# Patient Record
Sex: Male | Born: 1935 | Race: White | Hispanic: No | Marital: Married | Smoking: Former smoker
Health system: Southern US, Community
[De-identification: ages and names within clinical notes are randomized; demographics above are authoritative.]

## PROBLEM LIST (undated history)

## (undated) ENCOUNTER — Emergency Department (HOSPITAL_COMMUNITY): Disposition: A | Discharge status: Other Acute Inpatient Hospital | Payer: Medicare Other

## (undated) ENCOUNTER — Emergency Department (HOSPITAL_COMMUNITY): Discharge status: Absent without leave | Payer: Self-pay

## (undated) DIAGNOSIS — I7100 Dissection of unspecified site of aorta: Secondary | ICD-10-CM

## (undated) DIAGNOSIS — I2510 Atherosclerotic heart disease of native coronary artery without angina pectoris: Secondary | ICD-10-CM

## (undated) DIAGNOSIS — G5792 Unspecified mononeuropathy of left lower limb: Secondary | ICD-10-CM

## (undated) DIAGNOSIS — I739 Peripheral vascular disease, unspecified: Secondary | ICD-10-CM

## (undated) DIAGNOSIS — R011 Cardiac murmur, unspecified: Secondary | ICD-10-CM

## (undated) DIAGNOSIS — E119 Type 2 diabetes mellitus without complications: Secondary | ICD-10-CM

## (undated) DIAGNOSIS — Q2730 Arteriovenous malformation, site unspecified: Secondary | ICD-10-CM

## (undated) DIAGNOSIS — Z951 Presence of aortocoronary bypass graft: Secondary | ICD-10-CM

## (undated) DIAGNOSIS — I219 Acute myocardial infarction, unspecified: Secondary | ICD-10-CM

## (undated) DIAGNOSIS — E785 Hyperlipidemia, unspecified: Secondary | ICD-10-CM

## (undated) DIAGNOSIS — I4891 Unspecified atrial fibrillation: Secondary | ICD-10-CM

## (undated) DIAGNOSIS — I10 Essential (primary) hypertension: Secondary | ICD-10-CM

## (undated) HISTORY — DX: Cardiac murmur, unspecified: R01.1

## (undated) HISTORY — DX: Presence of aortocoronary bypass graft: Z95.1

## (undated) HISTORY — PX: BACK SURGERY: SHX140

## (undated) HISTORY — DX: Hyperlipidemia, unspecified: E78.5

## (undated) HISTORY — DX: Dissection of unspecified site of aorta: I71.00

## (undated) HISTORY — DX: Unspecified atrial fibrillation: I48.91

## (undated) HISTORY — DX: Arteriovenous malformation, site unspecified: Q27.30

## (undated) HISTORY — DX: Unspecified mononeuropathy of left lower limb: G57.92

---

## 2000-03-22 ENCOUNTER — Ambulatory Visit (HOSPITAL_COMMUNITY): Admission: RE | Admit: 2000-03-22 | Discharge: 2000-03-22 | Payer: Self-pay | Admitting: Family Medicine

## 2000-03-22 ENCOUNTER — Encounter: Payer: Self-pay | Admitting: Family Medicine

## 2000-07-25 ENCOUNTER — Encounter: Payer: Self-pay | Admitting: Neurosurgery

## 2000-07-25 ENCOUNTER — Ambulatory Visit (HOSPITAL_COMMUNITY): Admission: RE | Admit: 2000-07-25 | Discharge: 2000-07-25 | Payer: Self-pay | Admitting: Neurosurgery

## 2000-08-05 ENCOUNTER — Encounter: Payer: Self-pay | Admitting: Neurosurgery

## 2000-08-07 ENCOUNTER — Encounter: Payer: Self-pay | Admitting: Neurosurgery

## 2000-08-07 ENCOUNTER — Inpatient Hospital Stay (HOSPITAL_COMMUNITY): Admission: RE | Admit: 2000-08-07 | Discharge: 2000-08-10 | Payer: Self-pay | Admitting: Neurosurgery

## 2000-09-22 ENCOUNTER — Encounter: Payer: Self-pay | Admitting: Neurosurgery

## 2000-09-22 ENCOUNTER — Encounter: Admission: RE | Admit: 2000-09-22 | Discharge: 2000-09-22 | Payer: Self-pay | Admitting: Neurosurgery

## 2000-10-21 ENCOUNTER — Encounter: Payer: Self-pay | Admitting: Neurosurgery

## 2000-10-21 ENCOUNTER — Encounter: Admission: RE | Admit: 2000-10-21 | Discharge: 2000-10-21 | Payer: Self-pay | Admitting: Neurosurgery

## 2000-10-23 ENCOUNTER — Encounter: Payer: Self-pay | Admitting: Neurosurgery

## 2000-10-23 ENCOUNTER — Ambulatory Visit (HOSPITAL_COMMUNITY): Admission: RE | Admit: 2000-10-23 | Discharge: 2000-10-23 | Payer: Self-pay | Admitting: Neurosurgery

## 2000-11-24 ENCOUNTER — Ambulatory Visit (HOSPITAL_COMMUNITY): Admission: RE | Admit: 2000-11-24 | Discharge: 2000-11-24 | Payer: Self-pay | Admitting: Neurology

## 2000-11-24 ENCOUNTER — Encounter: Payer: Self-pay | Admitting: Neurology

## 2000-11-24 ENCOUNTER — Encounter (INDEPENDENT_AMBULATORY_CARE_PROVIDER_SITE_OTHER): Payer: Self-pay | Admitting: *Deleted

## 2000-12-03 ENCOUNTER — Encounter: Payer: Self-pay | Admitting: Neurology

## 2000-12-03 ENCOUNTER — Encounter (INDEPENDENT_AMBULATORY_CARE_PROVIDER_SITE_OTHER): Payer: Self-pay | Admitting: Specialist

## 2000-12-03 ENCOUNTER — Observation Stay (HOSPITAL_COMMUNITY): Admission: RE | Admit: 2000-12-03 | Discharge: 2000-12-03 | Payer: Self-pay | Admitting: Neurosurgery

## 2000-12-16 ENCOUNTER — Encounter (INDEPENDENT_AMBULATORY_CARE_PROVIDER_SITE_OTHER): Payer: Self-pay | Admitting: *Deleted

## 2000-12-16 ENCOUNTER — Encounter: Payer: Self-pay | Admitting: Thoracic Surgery

## 2000-12-16 ENCOUNTER — Ambulatory Visit (HOSPITAL_COMMUNITY): Admission: RE | Admit: 2000-12-16 | Discharge: 2000-12-16 | Payer: Self-pay | Admitting: Thoracic Surgery

## 2000-12-22 ENCOUNTER — Encounter (HOSPITAL_COMMUNITY): Admission: RE | Admit: 2000-12-22 | Discharge: 2001-03-22 | Payer: Self-pay | Admitting: Neurology

## 2001-01-06 ENCOUNTER — Encounter: Admission: RE | Admit: 2001-01-06 | Discharge: 2001-01-06 | Payer: Self-pay | Admitting: Neurosurgery

## 2001-01-06 ENCOUNTER — Encounter: Payer: Self-pay | Admitting: Neurosurgery

## 2001-01-09 ENCOUNTER — Encounter: Payer: Self-pay | Admitting: Neurology

## 2001-01-09 ENCOUNTER — Ambulatory Visit (HOSPITAL_COMMUNITY): Admission: RE | Admit: 2001-01-09 | Discharge: 2001-01-09 | Payer: Self-pay | Admitting: Neurology

## 2001-01-12 ENCOUNTER — Ambulatory Visit (HOSPITAL_COMMUNITY): Admission: RE | Admit: 2001-01-12 | Discharge: 2001-01-12 | Payer: Self-pay | Admitting: Neurology

## 2001-01-12 ENCOUNTER — Encounter: Payer: Self-pay | Admitting: Neurology

## 2001-01-13 ENCOUNTER — Encounter: Payer: Self-pay | Admitting: Neurology

## 2001-01-13 ENCOUNTER — Inpatient Hospital Stay (HOSPITAL_COMMUNITY): Admission: AD | Admit: 2001-01-13 | Discharge: 2001-01-15 | Payer: Self-pay | Admitting: Neurology

## 2001-01-17 ENCOUNTER — Encounter: Payer: Self-pay | Admitting: Emergency Medicine

## 2001-01-17 ENCOUNTER — Emergency Department (HOSPITAL_COMMUNITY): Admission: EM | Admit: 2001-01-17 | Discharge: 2001-01-17 | Payer: Self-pay | Admitting: Emergency Medicine

## 2001-02-20 ENCOUNTER — Inpatient Hospital Stay (HOSPITAL_COMMUNITY)
Admission: RE | Admit: 2001-02-20 | Discharge: 2001-03-03 | Payer: Self-pay | Admitting: Physical Medicine & Rehabilitation

## 2001-03-09 ENCOUNTER — Encounter
Admission: RE | Admit: 2001-03-09 | Discharge: 2001-06-07 | Payer: Self-pay | Admitting: Physical Medicine & Rehabilitation

## 2001-05-12 ENCOUNTER — Encounter: Payer: Self-pay | Admitting: Neurosurgery

## 2001-05-12 ENCOUNTER — Encounter: Admission: RE | Admit: 2001-05-12 | Discharge: 2001-05-12 | Payer: Self-pay | Admitting: Neurosurgery

## 2001-06-08 ENCOUNTER — Encounter
Admission: RE | Admit: 2001-06-08 | Discharge: 2001-07-31 | Payer: Self-pay | Admitting: Physical Medicine & Rehabilitation

## 2002-07-19 ENCOUNTER — Ambulatory Visit (HOSPITAL_COMMUNITY): Admission: RE | Admit: 2002-07-19 | Discharge: 2002-07-19 | Payer: Self-pay | Admitting: Gastroenterology

## 2002-07-19 ENCOUNTER — Encounter (INDEPENDENT_AMBULATORY_CARE_PROVIDER_SITE_OTHER): Payer: Self-pay | Admitting: Specialist

## 2002-09-08 ENCOUNTER — Encounter: Payer: Self-pay | Admitting: Emergency Medicine

## 2002-09-08 ENCOUNTER — Inpatient Hospital Stay (HOSPITAL_COMMUNITY): Admission: EM | Admit: 2002-09-08 | Discharge: 2002-09-28 | Payer: Self-pay | Admitting: Emergency Medicine

## 2002-09-08 ENCOUNTER — Encounter (INDEPENDENT_AMBULATORY_CARE_PROVIDER_SITE_OTHER): Payer: Self-pay | Admitting: Specialist

## 2002-09-09 ENCOUNTER — Encounter: Payer: Self-pay | Admitting: Thoracic Surgery (Cardiothoracic Vascular Surgery)

## 2002-09-10 ENCOUNTER — Encounter: Payer: Self-pay | Admitting: Thoracic Surgery (Cardiothoracic Vascular Surgery)

## 2002-09-11 ENCOUNTER — Encounter: Payer: Self-pay | Admitting: Thoracic Surgery (Cardiothoracic Vascular Surgery)

## 2002-09-12 ENCOUNTER — Encounter: Payer: Self-pay | Admitting: Thoracic Surgery (Cardiothoracic Vascular Surgery)

## 2002-09-13 ENCOUNTER — Encounter (INDEPENDENT_AMBULATORY_CARE_PROVIDER_SITE_OTHER): Payer: Self-pay | Admitting: Cardiovascular Disease

## 2002-09-15 ENCOUNTER — Encounter: Payer: Self-pay | Admitting: Thoracic Surgery (Cardiothoracic Vascular Surgery)

## 2002-09-17 ENCOUNTER — Encounter: Payer: Self-pay | Admitting: Infectious Diseases

## 2002-09-20 ENCOUNTER — Encounter: Payer: Self-pay | Admitting: Thoracic Surgery (Cardiothoracic Vascular Surgery)

## 2002-09-28 ENCOUNTER — Inpatient Hospital Stay
Admission: RE | Admit: 2002-09-28 | Discharge: 2002-10-01 | Payer: Self-pay | Admitting: Physical Medicine & Rehabilitation

## 2002-10-01 ENCOUNTER — Encounter (HOSPITAL_COMMUNITY)
Admission: RE | Admit: 2002-10-01 | Discharge: 2002-10-16 | Payer: Self-pay | Admitting: Physical Medicine & Rehabilitation

## 2002-11-02 ENCOUNTER — Encounter
Admission: RE | Admit: 2002-11-02 | Discharge: 2002-11-02 | Payer: Self-pay | Admitting: Thoracic Surgery (Cardiothoracic Vascular Surgery)

## 2002-11-02 ENCOUNTER — Encounter: Payer: Self-pay | Admitting: Thoracic Surgery (Cardiothoracic Vascular Surgery)

## 2002-11-04 ENCOUNTER — Encounter
Admission: RE | Admit: 2002-11-04 | Discharge: 2003-02-02 | Payer: Self-pay | Admitting: Physical Medicine & Rehabilitation

## 2005-02-02 ENCOUNTER — Ambulatory Visit: Payer: Self-pay | Admitting: Physical Medicine & Rehabilitation

## 2005-02-02 ENCOUNTER — Inpatient Hospital Stay (HOSPITAL_COMMUNITY): Admission: EM | Admit: 2005-02-02 | Discharge: 2005-02-20 | Payer: Self-pay | Admitting: Emergency Medicine

## 2005-02-04 HISTORY — PX: CARDIAC CATHETERIZATION: SHX172

## 2005-02-05 ENCOUNTER — Encounter (INDEPENDENT_AMBULATORY_CARE_PROVIDER_SITE_OTHER): Payer: Self-pay | Admitting: Cardiovascular Disease

## 2005-02-11 HISTORY — PX: CORONARY ARTERY BYPASS GRAFT: SHX141

## 2005-02-13 ENCOUNTER — Encounter: Payer: Self-pay | Admitting: Thoracic Surgery (Cardiothoracic Vascular Surgery)

## 2005-02-20 ENCOUNTER — Inpatient Hospital Stay
Admission: RE | Admit: 2005-02-20 | Discharge: 2005-02-25 | Payer: Self-pay | Admitting: Physical Medicine & Rehabilitation

## 2005-02-28 ENCOUNTER — Encounter
Admission: RE | Admit: 2005-02-28 | Discharge: 2005-03-27 | Payer: Self-pay | Admitting: Physical Medicine & Rehabilitation

## 2005-03-14 ENCOUNTER — Encounter
Admission: RE | Admit: 2005-03-14 | Discharge: 2005-03-14 | Payer: Self-pay | Admitting: Thoracic Surgery (Cardiothoracic Vascular Surgery)

## 2005-03-28 ENCOUNTER — Encounter
Admission: RE | Admit: 2005-03-28 | Discharge: 2005-06-26 | Payer: Self-pay | Admitting: Physical Medicine & Rehabilitation

## 2005-08-05 ENCOUNTER — Inpatient Hospital Stay (HOSPITAL_COMMUNITY): Admission: AD | Admit: 2005-08-05 | Discharge: 2005-08-07 | Payer: Self-pay | Admitting: Internal Medicine

## 2005-08-19 ENCOUNTER — Encounter: Admission: RE | Admit: 2005-08-19 | Discharge: 2005-11-17 | Payer: Self-pay | Admitting: Internal Medicine

## 2006-04-23 ENCOUNTER — Inpatient Hospital Stay (HOSPITAL_COMMUNITY): Admission: EM | Admit: 2006-04-23 | Discharge: 2006-04-26 | Payer: Self-pay | Admitting: Emergency Medicine

## 2006-05-24 ENCOUNTER — Inpatient Hospital Stay (HOSPITAL_COMMUNITY): Admission: EM | Admit: 2006-05-24 | Discharge: 2006-05-28 | Payer: Self-pay | Admitting: Emergency Medicine

## 2006-07-22 ENCOUNTER — Inpatient Hospital Stay (HOSPITAL_COMMUNITY): Admission: AD | Admit: 2006-07-22 | Discharge: 2006-07-25 | Payer: Self-pay | Admitting: Internal Medicine

## 2006-12-16 HISTORY — PX: CARDIOVASCULAR STRESS TEST: SHX262

## 2008-10-13 ENCOUNTER — Ambulatory Visit (HOSPITAL_COMMUNITY): Admission: RE | Admit: 2008-10-13 | Discharge: 2008-10-14 | Payer: Self-pay | Admitting: Otolaryngology

## 2008-10-13 ENCOUNTER — Encounter (INDEPENDENT_AMBULATORY_CARE_PROVIDER_SITE_OTHER): Payer: Self-pay | Admitting: Otolaryngology

## 2009-01-04 ENCOUNTER — Encounter: Admission: RE | Admit: 2009-01-04 | Discharge: 2009-01-04 | Payer: Self-pay | Admitting: Podiatry

## 2010-10-10 LAB — BASIC METABOLIC PANEL
BUN: 30 mg/dL — ABNORMAL HIGH (ref 6–23)
CO2: 25 mEq/L (ref 19–32)
Calcium: 9.3 mg/dL (ref 8.4–10.5)
Creatinine, Ser: 1.34 mg/dL (ref 0.4–1.5)
Glucose, Bld: 166 mg/dL — ABNORMAL HIGH (ref 70–99)

## 2010-10-10 LAB — CBC
MCHC: 34.8 g/dL (ref 30.0–36.0)
RDW: 12.8 % (ref 11.5–15.5)

## 2010-10-10 LAB — GLUCOSE, CAPILLARY: Glucose-Capillary: 179 mg/dL — ABNORMAL HIGH (ref 70–99)

## 2010-11-13 NOTE — Op Note (Signed)
NAME:  [name] [name], [name] [name]             ACCOUNT NO.:  [id number]   MEDICAL RECORD NO.:  [id number]          PATIENT TYPE:  OIB   LOCATION:  5128                         FACILITY:  [facility]   PHYSICIAN:  Suzanna Obey, M.D.       DATE OF BIRTH:  July 02, 1928   DATE OF PROCEDURE:  10/13/2008  DATE OF DISCHARGE:                               OPERATIVE REPORT   PREOPERATIVE DIAGNOSIS:  Floor of mouth squamous cell carcinoma.   POSTOPERATIVE DIAGNOSIS:  Floor of mouth squamous cell carcinoma.   SURGICAL PROCEDURES:  Excision and resection of the left floor of mouth  with cannulation of [place] duct.   ANESTHESIA:  General.   ESTIMATED BLOOD LOSS:  Approximately 20 mL.   INDICATIONS:  This is a 77 year old who had a biopsy performed by Oral  Surgery in the left floor of mouth which came back squamous cell  carcinoma.  It is undetermined whether there is any margin or residual  tumor and he was instructed to resect this area and he agrees.  We  discussed the procedure and risks and benefits as well as options.  All  questions were answered and consent was obtained.   OPERATION:  The patient was taken to the operating room and placed in  the supine position after draping in the usual sterile manner.  An  incision was made around what appeared to be just a small little  palpable kernel that was no more then 1 mm to 2 mm in size and there was  no obvious change in the mucosa.  Approximately, 1-cm margin was taken  around this entire lesion in an elliptical fashion.  This dissection was  performed with electrocautery and then sent for frozen section.  Frozen  section unbelievably showed a positive margin of the posterior and  medial margin.  More resection was carried out to try to gain a negative  margin and this did come back second piece negative.  The [facility] duct  had been cannulated prior to the excision because of the inferior or  posterior aspect of the incision appeared to come close to this  duct and  it was cannulated and preserved.  The duct opening was brought out to  the edge of the wound and the wound was closed with interrupted 3-0  Vicryl.  The duct was secure with a chromic suture.  The wound was  irrigated prior to closure and after closure and there was good  hemostasis.  The oral cavity and oropharynx were suctioned of all blood  and debris.  The patient was awakened and brought to the recovery room  in stable condition.  Counts were correct.           ______________________________  Suzanna Obey, M.D.     JB/MEDQ  D:  10/13/2008  T:  10/14/2008  Job:  [id number]   cc:   Lyndal Pulley Chales Salmon, M.D.

## 2010-11-16 NOTE — Discharge Summary (Signed)
[place]. [facility]  Patient:    [name] [name], [name] [name]                    MRN: [id number] Adm. Date:  [id number] Disc. Date: [id number] Attending:  Erich Montane CC:         Pollyann Savoy, M.D.  Hewitt Shorts, M.D.   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  HISTORY OF PRESENT ILLNESS:  [name] [name] has a prior history of lower back and right leg pain and underwent right lumbar laminectomy in March 1986, by Dr. Wende Bushy at [facility] in [place].  He did well until June 2001, when he noted the onset of side distribution pain occurring in the back of his legs relieved by taking Advil that would occur while playing golf.  In October 2001, he could not walk a great distance and in February 2002, underwent bilateral L4-L5 decompression procedure with PLIF spacers and bilateral posterior arthrodesis with posterior instrumentation and right iliac crest bone graft procedure by Dr. Shirlean Kelly.  At that time, he noted immediate relief of pain occurring in his legs.  Postoperatively, he did well and noted no numbness or bowel or bladder dysfunction.  In mid April of 2002, he noted the onset of numbness occurring in his buttock region radiating into his groin and down both hips, greater on the right than on the left.  This was not associated with pain.  He developed the onset of bladder incontinence requiring self-catheterization in April 2002, and bowel incontinence.  He developed progressive right foot drop and began using a rolling walker.  He then noted the onset of left leg weakness in May.  Evaluation with MRI of the lumbar spine postoperatively in April showed no evidence of ischemia  to the cord at that time and mild stenosis at the L3 level.  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  He had a right foot drop.  He underwent a total lumbar and cervical myelogram on Nov 24, 2000, showing patency of the fusion at L4-L5 with moderate stenosis at L3-L4 and L5-S1.  Due to diffusely protruding disc and some facet and ligamentous hypertrophy, thoracic myelogram showed no evidence of stenosis in the compressive region.  There were prominent vascular structures, but it was not thought to represent an arteriovenous malformation.  The post myelogram CT scan did show the vascular structures well and they were thought to be within normal limits.  Cervical spine myelogram showed widely patent throughout the cervical spinal cord region.  CT scan of the chest on December 03, 2000, showed a 2 x 1.2 cm right upper lobe nodule which was found to be begin under radiographic guidance biopsy.  At the time of myelogram, he had a protein of 142, glucose 43, 12 red blood cells, 13 white blood cells.  IgG in CSF 15.2 with a slightly elevated index and negative oligoclonal banding present. Evaluation for neuropathy included heavy metal sedimentation rate, HIV, ANA, serum protein electrophoresis with mild elevation of alpha I  and alpha II proteins, negative angiotensin-converting enzyme, negative immunofixation electrophoresis.  A right sural and skeletal nerve biopsy on December 03, 2000, showed degeneration with regeneration and only slight mild demyelinization with remyelinization present.  Amyloid stains were negative, but Dr. Alonza Bogus at [facility] felt he could not entirely rule out the possibility of amyloid neuropathy.  The patient was treated  with a five-day course of 400 mcg/kl of IVIG beginning December 22, 2000, through December 26, 2000, without any improvement. He noted increased weakness in his left leg, obstipation, bowel incontinence and bladder incontinence.  MRI of the thoracic spine without contrast on December 13, 2000, raised a question of spinal cord AVM with swelling of the cord from C7 through the conus.  He was admitted for further evaluation.  PAST MEDICAL HISTORY: 1. Hypertension. 2. Elevated cholesterol. 3. Diabetes mellitus. 4. Old left foot injury.  MEDICATIONS: 1. Glucophage 500 mg b.i.d. 2. Glucotrol 10 mg q.d. 3. Vasotec 10 mg q.d. 4. Aspirin one p.o. q.d.  His social history, occupational history, family history have been dictated elsewhere.  PAST SURGICAL HISTORY: 1. Left foot surgery in 1937, for trauma to his left foot causing deformity    and atrophy of his left foot and left lower leg. 2. History of right lumbar laminectomy in March 1986. 3. Bilateral L4-L5 decompressive laminectomies and stabilization procedure in    February 2002.  PHYSICAL EXAMINATION:  VITAL SIGNS:  Normal.  GENERAL:  Normal.  RECTAL:  Decreased rectal tone.  NEUROLOGIC:  His strength in the upper extremities was normal in the left leg. His EHL was 3, anterior tibialis 4, posterior tibialis 3, hamstrings 4, iliopsoas 2, quadriceps 4, gluteus medius 4, gluteus maximus 3-4.  In his right leg, EHL 0, anterior tibialis 0, posterior tibialis 0-1, gastrocnemius 1-2, hamstrings 3, iliopsoas 2, quadriceps 3, gluteus medius and gluteus maximus 3.  Decreased pinprick to his knees.  Absent reflexes in the lower extremities.  Absent joint position in his lower extremities.  Decreased vibration.  Plantar responses downgoing on the right and hard to evaluate on the left secondary to his old foot deformity.  LABORATORY DATA AND X-RAY FINDINGS:  Hemoglobin 13.6, hematocrit 38.3, white blood cell count 7800, platelet count 282,000,  75% polys, 15% lymphs, 8%  monos, 2% eos.  PT 13.6 with INR of 1.1, PTT 30.  Glucose on admission 110, BUN 24, creatinine 0.8, sodium 136, potassium 4.0, chloride 104, CO2 content 27.  Comprehensive metabolic panel normal except for slightly elevated ALT of 41 with upper limits of normal 40 and a low albumin of 3.3.  Urinalysis showed 3-6 wbcs, positive nitrate, 15% ketones, 30 mg protein, many bacteria with urine culture growing greater than 10 to the 5th colonies per mm of Enterobacter cloacae which was sensitive to Septra.  Repeat basic metabolic panel the evening following arteriography on January 14, 2001, showed glucose of 332, sodium 134, potassium 4.0, chloride 104, CO2 content 25, BUN 16, creatinine 0.9.  Pending CBC and BMP on the day of discharge as well as red blood cell folate, Vitamin B12 and methylmalonic acid.  Portable chest x-ray showed question of right mid lung density.  An arteriogram performed by Dr. Corliss Skains on January 14, 2001, under general anesthesia, showed no definite arteriovenous malformation.  HOSPITAL COURSE:  The patient was admitted and found to have evidence of a urinary tract infection and begun on IV Septra.  On the evening of January 13, 2001, he underwent arteriography on January 14, 2001 and tolerated  the procedure well without any worsening of his neurologic examination postprocedure and without any evidence of development of renal failure.  Enterobacter cloacae was grown from the urine and he was on Septra medicine.  Further discussion with radiology felt that this patient still could very well have a spinal dural AVM.  Films were to be sent to [facility] and consideration of repeat MRI of the thoracic spine or arteriogram to be made in the future.  DISCHARGE DIAGNOSES: 1. 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 (721.41) 2. Lumbar radiculopathy with L5-S1 on the right and at least S1 acute and    chronic on the left, status post surgery in  1986, and February 2002, with    fusion at L4-L5. (353.4) 3. 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    (257.2) 4. Enterobacter cloacae urinary tract infection. (599.0) 5. Diabetes mellitus. (250.60) 6. Hypertension. (796.2) 7. Old left foot injury. 8. Status post lumbar spine surgery x 2. (724.4) 9. Right pulmonary nodule with biopsy prior to admission, benign.  DISCHARGE MEDICATIONS: 1. Septra DS one p.o. b.i.d. x 5 days. 2. Restart Glucophage in two days. 3. Aspirin one p.o. q.d. 4. Multivitamins q.d. 5. Vasotec 10 mg q.d. 6. Glucotrol 10 mg q.d.  PENDING STUDIES:  Vitamin B12 level, red blood cell folate, methylmalonic acid and this a.m. CBC and BMP.  CONDITION ON DISCHARGE:  Unchanged from his prehospital status. DD:  01/15/01 TD:  01/16/01 Job: [id number]

## 2010-11-16 NOTE — Discharge Summary (Signed)
NAME:  [name] [name], [name] [name]             ACCOUNT NO.:  [id number]   MEDICAL RECORD NO.:  [id number]          PATIENT TYPE:  INP   LOCATION:  5501                         FACILITY:  [facility]   PHYSICIAN:  Corinna L. Lendell Caprice, MDDATE OF BIRTH:  01/31/1929   DATE OF ADMISSION:  08/05/2005  DATE OF DISCHARGE:  08/07/2005                                 DISCHARGE SUMMARY   DISCHARGE DIAGNOSES:  1.  Diabetic ketoacidosis most likely secondary to noncompliance.  2.  Prerenal azotemia.  3.  Acute renal insufficiency secondary to dehydration, resolved.  4.  History of hypertension.  5.  Hyperlipidemia.  6.  History of ruptured ascending aortic aneurysm status post repair.  7.  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  8.  Neurogenic bladder.  9.  Coronary artery bypass grafting in 2006.   DISCHARGE MEDICATIONS:  1.  Metformin 1000 mg p.o. b.i.d.  2.  Glucotrol 10 mg a day clear.  3.  He is to continue his other outpatient medications which include:      1.  Norvasc 10 mg a day.      2.  Benazepril 40 mg a day.      3.  Tylox as needed.   CONDITION:  Stable.   ACTIVITY:  Ad lib.   DIET:  Diabetic heart-healthy.   CONSULTATIONS:  None.   PROCEDURES:  None.   FOLLOW UP:  Dr. Abigail Miyamoto within a week at which time his pending hemoglobin  A1c can be checked and the patient is to bring his blood glucose values for  any needed adjustments in his medications. I would also recommended a repeat  BMET to insure creatinine is still within normal limits.   PERTINENT LABORATORY DATA:  On admission his CBC was fairly unremarkable.  Cardiac enzymes unremarkable. Sodium was 133, potassium 5.7, chloride 93,  bicarbonate 15, glucose 610, BUN 62, creatinine 2.5, total bilirubin 2.9;  otherwise, other values of the CMET were normal.  At discharge his  creatinine was 1.1, BUN was 23, potassium was 3.7, sodium 141, chloride 115,  bicarbonate 23, glucose 75. UA showed greater than 1000 glucose, greater  than  80 ketones, small blood, negative nitrites, negative leukocyte  esterase, negative protein.   SPECIAL STUDIES AND RADIOLOGY:  EKG showed normal sinus rhythm and right  atrial enlargement with poor R-wave progression.   Chest x-ray showed nothing acute.   HISTORY AND HOSPITAL COURSE:  [name] [name] is a 77 year old diabetic patient  of Dr. Abigail Miyamoto who presented with polyuria, polydipsia. He did have small  acetone in his blood which I have failed to mention above and he had a high  anion gap as well as hyperglycemia. He was dehydrated on examination and by  laboratories.  He was started on IV insulin and IV fluids.  His electrolytes  and BMETs were monitored. He was transitioned over to oral medications.  Please see H&P for complete details.  He had vomited twice. With the  correction of his DKA, his vomiting, polyuria and polydipsia are resolved.  In questioning the patient further, he was quite evasive about his  medications and his home  monitoring of his blood glucose. As far as I can  tell, he stopped taking his glipizide many months ago simply because he ran  out and did not call for a refill.  He also stopped monitoring his blood  sugars. In looking back at some laboratories from October 2006, his blood  sugar was 200. I have started him back on his Glucotrol at 10 mg a day and  increased his metformin to 1000 mg a day. His hemoglobin A1c at this time is  pending. He may need to have his glipizide increased to twice a day, but I  will defer to his primary care physician.  At the time of discharge, his  blood sugars were 200 and below and his anion gap acidosis had resolved.  I  did order a diabetic teaching for him and stressed the importance of  compliance with medications, diet, monitoring blood glucose and close follow-  up with Dr. Abigail Miyamoto.  I have also left Dr. Abigail Miyamoto a voice mail about my  concerns and what to follow up on.      Corinna L. Lendell Caprice, MD  Electronically  Signed     CLS/MEDQ  D:  08/07/2005  T:  08/07/2005  Job:  [id number]   cc:   Chales Salmon. Abigail Miyamoto, M.D.  Fax: [phone]

## 2010-11-16 NOTE — Discharge Summary (Signed)
NAME:  [name] [name], [name] [name]             ACCOUNT NO.:  [id number]   MEDICAL RECORD NO.:  [id number]          PATIENT TYPE:  INP   LOCATION:  1320                         [facility]AC[facility]   PHYSICIAN:  Deirdre Peer. Polite, M.D. DATE OF BIRTH:  [birth year]   DATE OF ADMISSION:  05/23/2006  DATE OF DISCHARGE:  05/28/2006                               DISCHARGE SUMMARY   DISCHARGE DIAGNOSES:  1. Urosepsis, resolved.  Culture positive for Klebsiella, to complete      five more days of oral Cipro at 250 b.i.d.  2. Acute renal failure, resolved.  Please note the patient's HCTZ, and      Glucotrol have been held on this hospitalization.  Patient will be      allowed to resume his ACE HCTZ and asked to hold Glucophage until      cleared by his primary MD after follow-up labs show stability of      his creatinine.  3. Diabetes, uncontrolled.  Per patient's report, sugars have been      uncontrolled at home.  Lantus has been added during this      hospitalization.  Patient will continue Glucotrol and will have      further titration of his meds and possible resumption of Glucophage      by his primary MD at his discretion.  4. Mild anemia, probably dilutional effect.  Hemoglobin nadir at 9.2;      however, with holding IV fluids, hemoglobin returned to 10.1.      Recommend further outpatient followup with the patient's renal      dysfunction on admission.  Cannot exclude a component of chronic      renal disease.  5. Hypertension.  6. History of thoracic aortic aneurysm, status post repair with      resultant paraparesis.  7. Neurogenic bladder, requires catheterization.   DISCHARGE MEDICATIONS:  1. Cipro 250 p.o. b.i.d.  2. Lantus 20 units daily.  3. Glucotrol 10 mg daily.  4. HCTZ 25 mg daily.  5. Metoprolol 50 mg b.i.d.  6. Norvasc 10 mg daily.  7. Tylox p.r.n.  8. Zetia 10 mg daily.  9. Aspirin 81 mg daily.  10.Actos 15 mg daily.  11.Benazepril 40 mg daily.   Patient is asked to hold  Glucophage until seen by primary MD.   DISPOSITION:  Patient is being discharged to home in stable condition.  I have requested this patient be seen by PT prior to discharge; however,  the patient feels that he is at his baseline and does not desire to  await PT evaluation.  Patient will be seen by diabetic coordinator and  shown until the patient is comfortable with injecting insulin.  I have  asked if he wanted diabetic education at home.  Patient states that he  does not want.   CONSULTS:  None.   STUDIES:  CBC on admission:  White count 20, hemoglobin 11.3.  BMET on  admission is significant for creatinine of 2, BUN 50, glucose 282.  UA  abnormal, consistent with UTI.  Urine culture:  Klebsiella pneumoniae,  sensitive to Cipro.  Blood cultures, no growth to date.   CHEST X-RAY:  COPD.  No acute findings.   HISTORY OF PRESENT ILLNESS:  A 77 year old male presents to the ED with  complaints of the above fever.  Has a history of recurrent urinary tract  infections.  Admission was deemed necessary for further evaluation and  treatment.  Please see dictated H&P for further details.   PAST MEDICAL HISTORY:  As stated above.   MEDICATIONS:  As per admission H&P.   ALLERGIES:  No known drug allergies.   SOCIAL HISTORY:  Per admission H&P.   HOSPITAL COURSE:  Patient was admitted to an internal medicine floor bed  for evaluation and treatment of UTI, probably urosepsis.  Patient was  given aggressive IV fluids and IV antibiotics.  Patient is pan-cultured.  Blood cultures are negative.  Urine culture was __________ and  ultimately showed Klebsiella.  Patient's hospital course was one of slow  but continued improvement.  Patient's renal function improved within  normal range.  Patient did have a mild drop in his H&H of 9.2; however,  this is partly felt to be dilutional.  IV fluids were placed on hold and  the following day it was 10.1.  With the patient's renal dysfunction on   admission, cannot exclude component of anemia of chronic disease.  Intake was at baseline.  Patient remained afebrile.  Patient did not  have any other complications during this hospitalization.  Patient did  have continued high blood sugars during this hospitalization and as  stated above in the problem list, having had high blood sugars at home.  At this time, we will institute Lantus, continue his Glucotrol, and  patient may require further titration of his Lantus on an outpatient  basis.  Because of the patient's renal dysfunction, he has been asked to  hold Glucophage until seen by his primary MD and at that time  demonstrates stability of his renal function.   Patient has several other medical problems which are at baseline.  Currently, the patient is medically stable for discharge.  Will require  outpatient followup with his primary MD in one week, Dr. Abigail Miyamoto.      Deirdre Peer. Polite, M.D.  Electronically Signed     RDP/MEDQ  D:  05/28/2006  T:  05/28/2006  Job:  [id number]   cc:   Chales Salmon. Abigail Miyamoto, M.D.  Fax: [phone]

## 2010-11-16 NOTE — Op Note (Signed)
NAME:  [name] [name], [name] [name]                       ACCOUNT NO.:  [id number]   MEDICAL RECORD NO.:  [id number]                   PATIENT TYPE:  INP   LOCATION:  2313                                 FACILITY:  [facility]   PHYSICIAN:  Salvatore Decent. Dorris Fetch, M.D.         DATE OF BIRTH:  09-Oct-1928   DATE OF PROCEDURE:  09/09/2002  DATE OF DISCHARGE:                                 OPERATIVE REPORT   PREOPERATIVE DIAGNOSIS:  Cardiac tamponade, inadvertent placement of  catheter into right ventricle.   POSTOPERATIVE DIAGNOSES:  1. Cardiac tamponade, inadvertent placement of catheter into right     ventricle.  2. Contained rupture of ascending aortic aneurysm.   OPERATION PERFORMED:  Median sternotomy, removal of catheter from right  ventricle anterior free wall with repair of right ventricular laceration,  resection and grafting of contained ruptured ascending aortic aneurysm with  30 mm Hemashield Dacron graft.  Extracorporeal circulation plus circulatory  arrest.   SURGEON:  Salvatore Decent. Dorris Fetch, M.D.   ASSISTANT:  Toribio Harbour, R.N.   ANESTHESIA:  General.   FINDINGS:  1. Moderate amount of clotted blood in pericardial space.  2. Catheter entering right ventricle through free wall.  3. Small hematoma, pulmonary artery.  4. Aneurysmal ascending aorta approximately 5 cm in diameter.     Transesophageal echocardiography revealed no evidence of dissection flap     or rupture.  Epiaortic ultrasound likewise revealed no evidence of     dissection flap or rupture.  5. Intraprocedure massive bleeding.  Subsequent termination of ruptured     aneurysm in the posteromedial aspect of the aorta.  6. Aortic valve, no evidence of stenosis or insufficiency.   INDICATIONS FOR PROCEDURE:  [name] [name] is a 77 year old gentleman who  presented to [facility] after having a syncopal episode.  He was in  shock.  On presentation, he was cyanotic with blood pressure in 60s and 70s.  Initial resuscitation was begun and the patient was taken urgently to CT  scan.  There he was seen to have a pericardial effusion.  The ascending  aorta was enlarged but there was no evidence of leak, rupture or dissection.  The patient was diagnosed with likely cardiac tamponade, was seen in  consultation by  Nicki Guadalajara, M.D. and decision was made to take the patient to the cardiac  catheterization laboratory for percutaneous drainage of the pericardial  effusion.  While there echocardiography was performed left ventricular  collapse due to a large pericardial effusion.  Attempts to aspirate and  place a drain were difficult.  Eventually a drain was placed, nonclotting  blood was withdrawn; however, with continued withdrawal of blood from the  catheter.  There was no improvement in hemodynamics.  Contrast injection was  performed which showed the catheter to be localized in the left pulmonary  artery.  The patient remained hemodynamically unstable.  Transfusions were  given and the patient was completely resuscitated and taken  to the operating  room in critical but relatively stable condition.  The patient and his wife  both were informed of the need for urgent operative intervention for removal  of the catheter from the right ventricle as well as evacuation of the  pericardial fluid/hematoma.  The patient and wife understood that additional  procedures might be found to be necessary once in the operating room.  They  understood the risks of the procedure.  The patient consented verbally.  His  wife consented witnessed over the telephone.   DESCRIPTION OF PROCEDURE:  The patient was taken emergently to the operating  room.  General endotracheal anesthesia was induced.  The patient tolerated  the procedure well hemodynamically.  The patient was prepped from the chin  to the knees and draped in the standard fashion.  A median sternotomy was  performed.  The pericardium was opened.  There  was improvement in his  hemodynamics after opening the pericardium.  There was moderate amount of  clotted blood within the pericardial space.  The percutaneous drainage  catheter could be seen entering the anterior midright ventricular free wall.  A 3-0 pledgeted suture was placed around the catheter.  The catheter was  withdrawn and the pledgeted suture was tied.  This achieved excellent  hemostasis.  No acute marginal vessels were compromised.   Systematic inspection then was carried out.  Biopsies were taken of the  pericardium and sent for permanent sections as well as for cultures.  A  portion of the blood and clot within the pericardium were also sent for  cytology and cultures.  The pericardium appeared grossly normal as did the  epicardial surface of the heart.  The ascending aorta was enlarged but there  was no hematoma that was visible.  There was mild hematoma around the  pulmonary artery.  It is felt this could be possibly secondary to  manipulation of the catheter within the vessel.  There is no active  bleeding.  The patient was observed intraoperatively for a prolonged period  of time.  The chest was irrigated with warm saline and at this point there  was an increase in the patient's systolic blood pressure to the 190 to 200  range and there was significant bleeding.  The blood built up posteriorly in  the pericardial space.  Again there is no evidence of from direct  visualization of the aorta of the problem.  The aorta was gently elevated  and a hematoma could be seen on the back wall of the aorta.  Decision was  made to proceed with resection and grafting.  The patient had significant  blood loss requiring resuscitation efforts by anesthesia.  The blood loss  was tamponaded with sponges while the left femoral artery was dissected out  in the groin.  Proximal and distal control was obtained on the left common femoral artery.  The patient was fully heparinized and given an  aprotinin  load.  The pump also was primed with aprotinin.   Of note, prior to the intraoperative hemorrhage from the previously  contained ruptured aortic aneurysm, transesophageal echocardiography and  epiaortic ultrasound were performed which both failed to reveal any flap or  evidence of contained rupture.   The left common femoral artery was cannulated with a 20 French arterial  cannula.  Next, a dual stage venous cannula was placed via pursestring  suture in the right atrium.  Cardiopulmonary bypass was instituted and the  patient was immediately cooled with target  goal 18 degrees Celsius.  A  retrograde cardioplegia cannula was placed via pursestring suture in the  right atrium.  A left ventricular vent was placed via pursestring suture in  the right superior pulmonary vein.  An antegrade cardioplegia cannula was  not placed because of ongoing bleeding from the ascending aorta.   The aorta was cross-clamped.  The left ventricle was emptied via the aortic  root vent.  Cardiac arrest then was achieved with a combination of cold  retrograde blood cardioplegia and topical iced saline initially.  Additional  cardioplegia was administered down the coronary ostia using the hand held  probes to supplement the retrograde cardioplegia.  Additional cardioplegia  doses were then at 20 minute intervals throughout the remainder of the  procedure.   After achieving complete diastolic arrest.  The aorta was transected.  There  was a linear tear extending from along the posteromedial aspect of the aorta  into the aortopulmonary window where the rupture had been contained by the  adventitial tissue prior to the significant bleeding intraoperatively.  This  tear extended up to the level of the cross-clamp which was placed just below  the take off of the innominate artery.  It extended down to approximately 1  cm above the sinotubular junction.  It did not involve the sinotubular  junction, the  aortic valve or the coronary ostia.  Because of its distal  extension, we needed to place the proximal anastomosis at the level of the  beginning of the aortic arch, a previous circulatory arrest would be  necessary.  The patient continued to be cooled.  The aorta was transected at  the level of the sinotubular junction and sized for a 30 mm Hemashield  graft.  The 30 mm Hemashield graft with a side arm was used.  The proximal  anastomosis was performed with a running 3-0 Prolene suture.  A Teflon felt  pledget strip was used on the arterial side of the anastomosis.  After  performing the proximal anastomosis, bioglue was applied to the suture line  circumferentially to aid in hemostasis.   By this time, the patient had reached 18 degrees Celsius a catheter was  placed retrograde in the superior vena cava.  An umbilical tape was placed  around the superior vena cava.  Circulatory arrest was performed after loading the patient with steroids and barbiturates.  With circulatory  arrest, retrograde cerebral perfusion was performed according to protocol  maintaining pressures of less than 40 mmHg.  Excellent flows were obtained.   The aortic cross-clamp was removed.  The tear could be seen extending to  within 0.5 cm of the take-off of the innominate.  The tear was 180 degrees  from the innominate vessel.  The aorta was transected at this level.  The  Hemashield graft was cut to length and the distal anastomosis was performed  under circulatory arrest.  Patient was maintained in Trendelenburg position  during this portion of the procedure.  The distal anastomosis also was  performed with a running 3-0 Prolene.  At the completion of the anastomosis,  rewarming was initiated.  The anastomosis was deaired through the side arm  of the graft.  The side arm of the graft was cannulated with a 24 French  arterial cannula.  This was secured.  The arterial perfusion line was  switched from the femoral  artery to the side arm of the Hemashield graft and  full pump flow was reinitiated.  The total circulatory arrest  time was 24  minutes.  The total cross-clamp time was 65 minutes.   The patient initially fibrillated as was expected with the profound  hypothermia.  The patient was rewarmed and when the core temperature reached  30 degrees Celsius, the patient was defibrillated.  The heart was  decompressed via the left ventricular vent the entire time there was  fibrillation.  There was good hemostasis under the circumstances with the  proximal and distal anastomosis.  Epicardial pacing wires were placed on the  right ventricle and right atrium.  A dopamine drip was initiated.  When the  patient had resumed a spontaneous cardiac rhythm, the left ventricular vent  was removed as was the retrograde cardioplegia cannula.  The retrograde  cerebral perfusion cannula had been removed shortly after coming off  circulatory arrest.  When the patient had been rewarmed to a core  temperature of 37 degrees Celsius he was weaned from cardiopulmonary bypass  without difficulty.  The total bypass time was 171 minutes.   The patient was initially coagulopathic and fresh frozen plasma and  platelets were administered.  The patient had good hemodynamics after coming  off bypass and remained  hemodynamically stable throughout the postbypass  period.  A test dose of protamine was administered and was well tolerated.  The atrial cannula was removed.  The side arm of the aortic graft was  transected and oversewn with a 3-0 Prolene suture.  Hemostasis was achieved.  The chest was irrigated with 1L of warm normal saline containing 1 gm of  vancomycin.  Two mediastinal chest tubes were placed through separate  subcostal incisions.  The pericardium could not be reapproximated but the  mediastinal flap was used to cover the ascending aorta graft.  Prior to coming off bypass while still rewarming having followed  removal of  the circulatory arrest probe, then the cannula was removed from the left  common femoral artery.  That vessel had been repaired at that time with a  running 5-0 Prolene suture.  Flow had been re-established to the leg during  the rewarming phase.   The sternum was closed with heavy gauge interrupted stainless steel wires.  The pectoralis fascia was closed with running #1 Vicryl suture.  Subcutaneous tissue was closed with running 2-0 Vicryl suture and the skin  was closed  with staples.  The groin incision was closed in two layers and again the  skin staples were used.  All sponge, needle and instrument counts were  correct at the end of the procedure.  The patient was taken to the operating  room to the surgical intensive care unit intubated and in critical  condition.                                                Salvatore Decent Dorris Fetch, M.D.    SCH/MEDQ  D:  09/09/2002  T:  09/10/2002  Job:  [id number]   cc:   Nicki Guadalajara, M.D.  [phone] [address], [address]  [place], [place] [address]  Fax: [phone]   Devoria Albe, M.D.

## 2010-11-16 NOTE — Cardiovascular Report (Signed)
NAME:  DONOVEN, PETT             ACCOUNT NO.:  [id number]   MEDICAL RECORD NO.:  [id number]          PATIENT TYPE:  INP   LOCATION:  2019                         FACILITY:  [facility]   PHYSICIAN:  Madaline Savage, M.D.DATE OF BIRTH:  April 12, 1929   DATE OF PROCEDURE:  02/04/2005  DATE OF DISCHARGE:                              CARDIAC CATHETERIZATION   PROCEDURES PERFORMED:  1.  Selective coronary angiography by Judkins technique.  2.  Retrograde left heart catheterization.  3.  Left ventricular angiography.  4.  Aortic root angiography.  5.  Abdominal aortography.   COMPLICATIONS:  None.   ENTRY SITE:  Right femoral.   DYE USED:  Omnipaque.   PATIENT PROFILE:  Mr. [name] [name] is now a 77 year old gentleman who is  diabetic for the last 7 years and treated for hypertension. He has a history  of thoracic aneurysm grafting by Dr. Dorris Fetch in March 2004. 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  His initial cardiac enzymes and EKG's were normal. His subsequent enzymes  peaked at a troponin of 1.9 and he had T-wave inversions in this  anterolateral leads. Today, he enters the catheter lab after being on  Integrilin and heparin and enters the cath lab with stable vital signs.  Today's procedure was performed by way of a right percutaneous femoral  approach using 6-French catheters and sheaths and no complications occurred.   RESULTS:   PRESSURES:  Left ventricular pressure was 135/14, end-diastolic pressure 20.  Central aortic pressure 135/60 with a mean of 90. No aortic valve gradient  by pullback technique.   ANGIOGRAPHIC RESULTS:  The patient had mild calcifications of the proximal  LAD and circumflex. The left main was very short.   The LAD coursed to the cardiac apex and gave rise  to a series of septal  perforator branches approximately five to six, three diagonal branches and  the LAD terminated the apex. The LAD was diffusely diseased throughout its  proximal segments. The most severely diseased segment of all however was a  mid-LAD stenosis just beyond the major diagonal branch #2 that was 90-95%  severe and fairly focal. After this stenosis, the LAD then became fairly  normal and there is a long segment of LAD mid and distal that is mildly  diseased and appears to be graftable.   The circumflex coronary artery likewise was calcified proximally. There is  some an eccentric narrowing in the proximal vessel that appears to be 60-75%  narrowed. The mid circumflex gives rise to two obtuse marginal branches in  fairly close proximity which appear to be angiographically patent. The  distal circumflex is long and becomes a posterolateral branch and appears  normal.   The right coronary artery is the most severely diseased of the three  vessels. It is 80% stenosed proximally at the junction of the proximal and  mid RCA. The mid RCA contains a long segmental  stenosis of 80-90%. The  distal RCA just before the bifurcation of the posterolateral and posterior  descending branches is 95-99% stenosed. The distal runoff is bypassable.   The left subclavian artery is widely patent throughout with a lumpy bumpy  irregularities but no significant lesions. The internal mammary on the left  is a beautiful vessel without any significant lesions. The remaining portion  of the subclavian and beginning of this axillary vessels appear normal.   LV angiography shows vigorous contractility of all wall segments in a RAO  projection with no mitral regurgitation and estimated ejection fraction of  60%. Ascending aorta shows grafted aorta ascending to a point unknown  distally.   The aortic root angiography shows mild aortic regurgitation centrally  located.   The abdominal aortography  intended to show renal shows both renals to be  single and normal. There is a suprarenal area of marked aneurysmal  dilatation which is not well seen but seen well enough to know that is  present. CT angiography is recommended.   FINAL DIAGNOSES:  1.  Severe three-vessel coronary disease affecting left anterior descending      artery and right coronary artery most severely  2.  Left ventricular systolic function normal. Ejection fraction 60%.  3.  Normal renal arteries and fairly normal abdominal aorta with suprarenal      aorta showing aneurysmal dilatation.  4.  Normal left subclavian and internal mammary artery on the left.   PLAN:  The patient should be considered for bypass grafting. The technical  difficulty of that would be effected by the presence of Dacron graft  material in the ascending aorta.   I have spoken to Dr. Sunday Corn office and left a message there for a  surgical consultation.       WHG/MEDQ  D:  02/04/2005  T:  02/04/2005  Job:  [id number]

## 2010-11-16 NOTE — Discharge Summary (Signed)
NAME:  [name] [name], [name] [name]                       ACCOUNT NO.:  [id number]   MEDICAL RECORD NO.:  [id number]                   PATIENT TYPE:  ORB   LOCATION:                                       FACILITY:  [facility]   PHYSICIAN:  Ranelle Oyster, M.D.             DATE OF BIRTH:  03-09-1929   DATE OF ADMISSION:  09/28/2002  DATE OF DISCHARGE:  10/01/2002                                 DISCHARGE SUMMARY   DISCHARGE DIAGNOSES:  1. Repair of thoracic aortic aneurysm.  2. Deconditioned secondary to #1.  3. Atrial fibrillation -- new diagnosis.  4. Status post pericarditis and endocarditis.  5. History of diabetes mellitus.  6. History of anemia.   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  Diagnosis was acute ascending aortic thoracic  aneurysm rupture and cardiac tamponade.  The patient underwent repair of  thoracic aneurysm on September 08, 2002 by Dr. Viviann Spare C. Hendrickson.  Postoperative complications included infected pericardial effusion with  Strep viridans, questionable endocarditis, atrial fibrillation, bradycardia,  pericarditis and depression.  PT report at this time indicates the patient  is ambulating min-assist 104 feet with rolling walker with right AFO,  presently on IV Rocephin, and followed by ID.  He is also on Coumadin for  CVA and DVT prophylaxis.  The patient is also being monitored by cardiology.  The patient was transferred to [facility] Department on September 28, 2002.   PAST MEDICAL HISTORY:  Past medical history is significant for elevated  cholesterol, NIDDM, hypertension, neurogenic bladder and bowel.   PAST SURGICAL HISTORY:  Lumbar laminectomy.   SOCIAL HISTORY:  The patient lives with wife in one-level home in  [place], [place], with three  steps to entry, and was independent  prior to admission.  He is retired from [employer], quit tobacco 21 days  ago, occasionally wife able to assist as needed.   ALLERGIES:  None.   PRIMARY CARE [place]:  Dr. Chales Salmon. Thacker.   CARDIOLOGIST:  Dr. Nicki Guadalajara.   CARDIOVASCULAR-THORACIC SURGEON:  Dr. Viviann Spare C. Hendrickson.   UROLOGIST:  Dr. Derrell Lolling. Sural.   FAMILY HISTORY:  Noncontributory.   REVIEW OF SYSTEMS:  Review of systems is significant for dizziness and back  pain.   HOSPITAL COURSE:  The patient was admitted to [facility] Department on  September 28, 2002, where he received more than an hour of therapy daily.  Despite his multiple medical conditions, the patient progressed very well  during subacute.  He was discharged home at a supervision level, ambulating  about 100 feet with rolling walker.  He remained on Coumadin for CVA and DVT  prophylaxis.  The patient had no new medical issues noted to occur  while on  rehab.  His atrial fibrillation rate remained controlled on Cordarone 200 mg  p.o. b.i.d. and was followed by cardiologist as needed.  He completed a  course of IV Rocephin while in rehab; the patient will continue to have  [facility] outpatient IV antibiotics set up for Rocephin for a total of 28  days' administration.  The patient's blood glucose remained controlled with  Glucotrol with good control.  He remained on Vicodin as needed for pain.  The patient remained on Niferex for anemia; he had admission hemoglobin of  9.5 and admission hematocrit of 27.7.  On October 01, 2002, it was noted that  the patient did start complaining of hallucinations and nightmares, maybe  secondary to medications, particularly the Reglan and Effexor, so they were  both discontinued and nightmares and hallucinations were resolved.  There  were no other major issues that occurred while the patient was in rehab.   Latest labs indicate latest hemoglobin was 9.5, hematocrit  27.7, white blood  cell count was 8.6, platelet count was 349,000.  Latest INR was 2.5 and  latest PT was 24.1.  Latest sodium was 139, potassium 4.3, chloride 101, CO2  25, glucose 129, BUN 17, creatinine 1.4.  Latest AST 15, ALT was 25.  At  time of discharge, all vitals were stable.  The patient was ambulating 100  with supervision with rolling walker, could transfer sit to stand, modified  independently, and bed mobility, modified independently.  The patient was  able to perform most ADLs at min-assistance to supervision level.  The  patient was discharged home with family.   DISCHARGE MEDICATIONS:  1. Glucophage 500 mg twice daily.  2. Glucotrol 10 mg one tab daily.  3. Vasotec 10 mg daily.  4. Lipitor 10 mg daily.  5. Vicodin 5/500 mg one to two tabs every four to six hours as needed for     pain.  6. Aspirin 81 mg daily.  7. Lopressor 25 mg every eight hours.  8. Niferex 150 mg twice daily.  9. Cordarone 200 mg one tablet twice daily.  10.      Folic acid 2 mg daily.  11.      Continue Coumadin at 2.5 mg daily.   PAIN MANAGEMENT:  Pain management will be with Vicodin.   ACTIVITY:  Use walker.  No driving.  No drinking alcohol.   DIET:  Diet is low salt, low cholesterol and no concentrated sweets.  Check  CBG twice daily and record results.   FOLLOWUP:  He is to follow up at [facility] daily for IV antibiotics to  continue for a total of 28 days; the patient had completed a total course of  12 days out of 28 while in [place].  Patient will go to [facility]  for PT and OT.  Follow up with Dr. Nicki Guadalajara on October 04, 2002; follow up  with Dr. Dorris Fetch, Tuesday, Nov 02, 2002, at 1 o'clock; follow up with Dr.  Chales Salmon. Thacker within six weeks for anemia; follow up with Dr. Ranelle Oyster as needed.     Junie Bame, P.A.                       Ranelle Oyster, M.D.   LH/MEDQ  D:  11/11/2002  T:  11/13/2002  Job:  [id number]   cc:   Salvatore Decent.  Dorris Fetch, M.D.  [address]  [place]  [place] [address]  Fax: [phone]   Nicki Guadalajara, M.D.  [phone] [address], [address]  [place], [place] [address]  Fax: [phone]   Chales Salmon. Abigail Miyamoto, M.D.  [address]  [place]  [place] [address]  Fax: [phone]

## 2010-11-16 NOTE — Discharge Summary (Signed)
NAME:  [name] [name], [name] [name]                       ACCOUNT NO.:  [id number]   MEDICAL RECORD NO.:  [id number]                   PATIENT TYPE:  INP   LOCATION:  2032                                 FACILITY:  [facility]   PHYSICIAN:  Salvatore Decent. Dorris Fetch, M.D.         DATE OF BIRTH:  01-11-1929   DATE OF ADMISSION:  09/08/2002  DATE OF DISCHARGE:  09/28/2002                                 DISCHARGE SUMMARY   PRIMARY ADMITTING DIAGNOSIS:  Syncope.   ADDITIONAL DISCHARGE DIAGNOSES:  1. Contained rupture of ascending aortic aneurysm.  2. Cardiac tamponade secondary to inadvertant of catheter into right     ventricle.  3. 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  4. Postoperative atrial fibrillation.  5. Noninsulin-dependent diabetes mellitus.  6. Hypertension.  7. Hyperlipidemia.  8. Postoperative deconditioning.   PROCEDURES PERFORMED:  1. Attempted pericardiocentesis in the catheterization lab.  2. Median sternotomy with removal of catheter from right ventricle anterior     free wall.  3. Repair of right ventricular laceration.  4. Resection and grafting of contained ruptured ascending aortic aneurysm     with 20 mm Hemashield Dacron graft via extracorporal circulation and     circulatory arrest.   HISTORY OF PRESENT ILLNESS:  The patient is a 77 year old male who developed  a syncopal episode of the date of this admission.  He was brought to the ER  at [facility] and on admission was noted to be cyanotic  with blood pressures in the 60s and 70s.  He was taken urgently to CT scan  as initial resuscitation was begun.  There was evidence of a pericardial  effusion and the ascending aorta was enlarged, but there was no evidence at  that time of leak, rupture, or dissection.  He was felt to have a likely  cardiac tamponade and was seen in consultation by Nicki Guadalajara, M.D.  The  decision was made to take the patient to the catheterization lab  for  percutaneous drainage of pericardial effusion.  Attempts to aspirate and  place a drain were very difficult.  Eventually a drain was placed and  nonclotting blood was withdrawn.  However, his hemodynamics did not improve  and he continued to have nonclotting blood withdrawn through the catheter.  A cardiothoracic surgery consultation was obtained and in the interim he was  transfused.  It was felt that his best course of action would be to go to  the operating room emergently for operative intervention, evacuation of the  pericardial fluid and hematoma, and removal of the catheter from the right  ventricle.   HOSPITAL COURSE:  He was taken emergently to the operating room where he  underwent median sternotomy with drainage of his pericardial effusion.  The  catheter was noted to be entering the right ventricle through the free wall  and this was removed and repaired.  His ruptured ascending aortic aneurysm  was also repaired with a 30  mm Hemashield Dacron graft.  He was transferred  in critical condition from the operating room to the SICU.  He required  nitroglycerin and beta blocker therapy overnight for control of his blood  pressure.  He was also noted to be coagulopathic and was treated with fresh  frozen plasma, as well as platelets.  He was transfused in the operating  room.  Later in the day on postoperative day #1, he was hemodynamically  stable and was able to be extubated.  He was weaned from his drips.  He was  slowly mobilized and his diet was advanced.  By postoperative day #2, he was  doing very well and was able to be transferred to the stepdown unit.  He was  mobilized with the use of a rolling walker.  He was also diuresed and  treated with aggressive pulmonary toilet measures.  His blood pressure  continued to be a problem and he was restarted on Vasotec.  Also, as his  p.o. intake improved, he was restarted on his home diabetes medications.  A  smoking cessation  consult was obtained.  Pericardial fluid cultures obtained  intraoperatively came back positive for Streptococcus viridans.  Intraoperatively no vegetations were noted to be present on the valves, as  well as no evidence of purulence or inflammation.  Only blood was noted in  the pericardium intraoperatively.  The patient also remained afebrile.  Because of these reasons, an infectious disease consultation was obtained.  It was ultimately decided that despite the fact that this could possibly be  a contaminant that it could possibly represent an early bacterial  endocarditis.  It was felt to be in the patient's best interest to treat him  with a four-week course of IV Rocephin 2 g daily.  He developed a brief  episode of atrial fibrillation which resolved without treatment.  He also  later had an additional episode of atrial fibrillation with rates of 114-  128.  He was started on amiodarone and digoxin.  He converted back to normal  sinus rhythm and has had no further arrhythmias since that time.  He has  been quite deconditioned and has been working with physical therapy  throughout his hospitalization to regain strength and mobility.  He was  ultimately able to be transferred to the floor.  Otherwise he has done well  postoperatively.  He has remained afebrile and all vital signs have been  stable.  Again, he has maintained normal sinus rhythm.  He has also been  weaned off supplemental oxygen and is maintaining O2 saturations of greater  than 90% on room air.  He developed one brief episode of bradycardia and  junctional rhythm.  At that time, his amiodarone, digoxin, and Lopressor  were held.  This resolved and since that time his heart rates have been just  around 80-100.  He has been restarted back on his beta blocker and his  Vasotec and is tolerating this quite well.  Immediately postoperatively, his  creatinine bumped up to 1.8, but this has trended back down and stabilized at  1.3.  His blood sugars have been relatively stable.  Because of this  atrial fibrillation and his mobility issues, he was started on heparin and  Coumadin.  At the present time, his INR is therapeutic at 2.0.  He is  tolerating a regular diet and is having normal bowel and bladder function.  Since his INR is therapeutic, his heparin has been discontinued.  He is  slowly  improving, although he still needs a lot of work on reconditioning  and mobility issues.  It is felt that he would benefit from a short course  of therapy in the subacute care unit.  He has been evaluated by the [facility]  staff.  He will be transferred as soon as a bed is available, hopefully on  September 27, 2001.   DISCHARGE MEDICATIONS:  1. Coumadin, daily dose to be determined by PT and INR.  2. Glucotrol XL 10 mg daily.  3. Lipitor 10 mg q.h.s.  4. Folic acid 2 mg daily.  5. Protonix 40 mg daily.  6. Reglan 10 mg t.i.d.  7. Amiodarone 200 mg b.i.d.  8. Glucophage 500 mg b.i.d.  9. Enteric-coated aspirin 81 mg daily.  10.      Effexor XR 37.5 mg q.h.s.  11.      Lopressor 25 mg q.8h.  12.      Niferex 150 mg b.i.d.  13.      Vasotec 10 mg daily.  14.      Rocephin 2 g q.24h., currently on day #9 of #28 to complete a 28-     day course.  15.      Ultram 50-100 mg q.4-6h. p.r.n. for pain.  16.      Lortab 5/500 mg one to two q.4h. p.r.n. for pain.  17.      Sliding scale insulin on a moderately insulin sensitive protocol.   DISPOSITION:  He is to continue with physical and occupational therapy as  prescribed by the services.  He will continue with reconditioning and  ambulation while on the subacute care unit.  He will have daily pro times  with INRs drawn for Coumadin dosing.   WOUND CARE:  His skin staples have been removed and his incisions may be  cleaned daily with soap and water.   ACTIVITY:  He is to continue using his incentive spirometer regularly.  He  is asked to do no heavy lifting or strenuous  activity.   DIET:  He is to continue a low-fat, low-sodium, diabetic diet.   DISCHARGE FOLLOW-UP:  Appointments will be scheduled upon his discharge from  the subacute care unit to home.     Coral Ceo, P.A.                        Salvatore Decent Dorris Fetch, M.D.    GC/MEDQ  D:  09/27/2002  T:  09/27/2002  Job:  [id number]   cc:   Chales Salmon. Abigail Miyamoto, M.D.  [address]  [place]  [place] [address]  Fax: [phone]   Nicki Guadalajara, M.D.  [phone]  [place], [place] [address]  Fax: [phone]

## 2010-11-16 NOTE — Discharge Summary (Signed)
[place]  Patient:    [name] [name], [name] [name] Visit Number: [id number] MRN: [id number]          Service Type: [facility] Location: [id number] Attending Physician:  Herold Harms Dictated by:   Mcarthur Rossetti. Angiulli, P.A. Admit Date:  02/20/2001 Disc. Date: 03/03/01   CC:         Hewitt Shorts, M.D.  Genene Churn. Love, M.D.  Chales Salmon. Abigail Miyamoto, M.D.  Claudette Laws, M.D.   Discharge Summary  DISCHARGE DIAGNOSES: 1. Lumbar laminectomy, February 2002, status post right L5-S1 laminectomy for    dural arteriovenous fistula/arteriovenous malformation, February 16, 2001. 2. Neurogenic bowel and bladder. 3. Hypertension. 4. Diabetes mellitus.  HISTORY OF PRESENT ILLNESS:  [age]-year-old white male admitted to [facility] February 16, 2001, with a history of lumbar laminectomy in February of 2002 while at [facility] by Dr. Hewitt Shorts, developed a right foot drop, April of 2002, with numbness in the sacral region.  Underwent arteriogram, December 30, 2000, that was unremarkable.  Upon evaluation at [facility] with second arteriogram showed AVM. Underwent right L5-S1 laminectomy for dural A-V fistula repair, February 16, 2001, per Dr. Zachery Conch.  Postoperative pain management.  Urinary retention with history of neurogenic bladder; he was on self-catheterizations. Slow progress, bed mobility limited, ambulating 15 feet with a rolling walker. No chest pain or shortness of breath.  He was discharged on February 19, 2001 from [facility].  Admitted to [facility], February 20, 2001.  PAST MEDICAL HISTORY:  See discharge diagnoses.  PAST SURGICAL HISTORY:  Left foot surgery.  ALLERGIES:  None.  PRIMARY MEDICAL DOCTOR:  Dr. Chales Salmon. Thacker.  UROLOGIST:  Dr. Derrell Lolling. Sural.  HABITS:  No alcohol.  He does smoke cigarettes.  SOCIAL HISTORY:  He lives in [place], independent prior to admission, February 2002.   He is retired.  Two-level home, three steps to entry; they now have a ramp.  He stays on the first floor of the home.  He lives with his wife who can provide assistance on discharge.  MEDICATIONS PRIOR TO ADMISSION:  Glucotrol, Vasotec and Glucophage.  HOSPITAL COURSE:  Patient did well while in rehabilitation services with therapies initiated on a b.i.d. basis.  The following issues were followed during patients rehab course:  Pertaining to Mr. Andersons lumbar laminectomy, dural A-V fistula/AVM of February 16, 2001 remained stable. Surgical site was well-healed.  He was now independent in his room.  His mobility had greatly improved.  He still had a foot drop with an AFO brace in place.  He received followup per Dr. Genene Churn. Love of neurology services. Noted neurogenic bowel and bladder, self-catheterizations every five to six hours with respectable volumes.  Attempts were made with Urecholine and Flomax to stimulate bladder.  Patient was having some increased sensation but no spontaneous voids.  Patient had decided to continue this medication for now, follow up with Dr. Etta Grandchild of urology services if needed.  His blood pressures remained controlled on Vasotec, blood sugars within normal limits on Glucotrol and Glucophage.  He was ambulating modified independence with a rolling walker.  Plan was for outpatient therapies.  Latest labs showed a urine culture to be negative, hemoglobin 12.8, hematocrit 37, sodium 141, potassium 4.0, BUN 13, creatinine 0.7.  DISCHARGE MEDICATIONS: 1. Glucotrol XL 10 mg daily. 2. Vasotec 10 mg daily. 3. Glucophage 500 mg twice daily. 4. Multivitamin daily. 5. Urecholine 25 mg  three times daily. 6. Flomax 0.4 mg two tablets at bedtime.  ACTIVITY:  Activity as tolerated.  DIET:  Diet was 1800-calorie ADA.  SPECIAL INSTRUCTIONS:  No driving.  Outpatient therapies.  FOLLOWUP:  Follow up with Dr. Sandria Manly and Dr. Newell Coral as advised; Dr. Rande Brunt. [place] --  call for an appointment. Dictated by:   Mcarthur Rossetti. Angiulli, P.A. Attending Physician:  Herold Harms DD:  03/03/01 TD:  03/03/01 Job: [id number]

## 2010-11-16 NOTE — Discharge Summary (Signed)
NAME:  [name] [name], [name] [name]             ACCOUNT NO.:  [id number]   MEDICAL RECORD NO.:  [id number]          PATIENT TYPE:  ORB   LOCATION:  4526                         FACILITY:  [facility]   PHYSICIAN:  Erick Colace, M.D.DATE OF BIRTH:  03/21/1929   DATE OF ADMISSION:  02/20/2005  DATE OF DISCHARGE:  02/25/2005                                 DISCHARGE SUMMARY   DISCHARGE DIAGNOSES:  1.  Coronary artery disease and coronary artery bypass grafting February 11, 2005.  2.  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  3.  Neurogenic bladder.  4.  Chronic Coumadin therapy.  5.  Hypertension.  6.  Renal insufficiency.  7.  Noninsulin dependent diabetes mellitus.  8.  Chronic urinary tract infections.   HPI:  A 77 year old white male with history of chronic paraparesis with  thoracic aortic aneurysm repair in 2004 and vascular spinal cord injury at  [facility] on chronic Coumadin.  Admitted to inpatient rehab  services in 2004 after a thoracic aneurysm repair.  Now presents February 02, 2005, with acute onset of substernal chest pain, troponin 1.7, CPK-MB 10.9,  cardiac catheterization with severe 3 vessel disease.  Underwent coronary  artery bypass grafting x4 on February 11, 2005, per Dr. Dorris Fetch.   HOSPITAL COURSE:  Elevated creatinine level is 1.2 to 2.2, his Lasix and  Lotensin have been discontinued.  Monitored renal function with creatinine  1.9.  Chronic Coumadin therapy.  Foley catheter tube removed with  intermittent catheterization regimen prior to admission.  Admitted to  subacute care services.   PAST MEDICAL HISTORY:  See discharge diagnoses.  Occasional alcohol, he does  smoke approximately 1/2 to 1 pack per day.   SOCIAL HISTORY:  Lives with wife, two level home, bedroom downstairs, 3  steps to entry.  Wife can assist as needed on discharge.   MEDICATIONS PRIOR TO ADMISSION WERE:  1.  Glucophage 500 mg twice daily.  2.  Glucotrol  10 mg daily.  3.  Coumadin 5 mg daily.  4.  Toprol XL 100 mg daily.  5.  Hydrochlorothiazide 12.5 mg daily.  6.  Norvasc 10 mg daily.  7.  Lotensin 40 mg daily.   HOSPITAL COURSE:  Patient did well while in rehabilitation services, therapy  was initiated daily, the following issues were followed during patient's  rehab course.  Pertaining to Mr. Spike's coronary artery disease, his  chest incision had healed nicely, he would followup with cardiothoracic  surgery.  Chronic paraparesis due to thoracic aortic aneurysm repair.  He  remained on intermittent catheterizations for neurogenic bladder.  Chronic  Coumadin therapy which was followed by Dr. Tresa Endo of cardiology services.  He  had no chest pain, no shortness of breath.  During his rehab course his  blood sugars had some elevation, his Glucotrol was increased to 10 mg twice  daily.  Overall functionally with physical therapy, he was essentially  minimal guard for ambulation and supervision, modified independence bed  mobility, he had declined occupational therapist and he had declined  occupational therapy sessions.  He  was discharged to home.   DISCHARGE MEDICATIONS AT TIME OF DICTATION INCLUDED:  1.  Toprol XL 50 mg daily.  2.  Mucinex 1200 mg twice daily.  3.  Coumadin 5 mg daily.  4.  Aspirin 81 mg daily.  5.  Trinsicon twice daily.  6.  Multivitamin daily.  7.  Glucotrol 10 mg twice daily.  8.  Oxycodone as needed pain.   ACTIVITY:  As tolerated.   DIET:  Diabetic diet.   SPECIAL INSTRUCTIONS:  Followup with:  1.  Dr. Tresa Endo, cardiology services, for ongoing care with Coumadin therapy.  2.  Dr. Dorris Fetch, cardiothoracic surgery.      Mariam Dollar, P.A.      Erick Colace, M.D.  Electronically Signed    DA/MEDQ  D:  02/25/2005  T:  02/25/2005  Job:  [id number]   cc:   Erick Colace, M.D.  [address] [place]  [place] [address]  Fax: [phone]   Chales Salmon. Abigail Miyamoto, M.D.  [address]  [place]  [place] [address]  Fax: [phone]   Salvatore Decent. Dorris Fetch, M.D.  [address]  [place]  [place] [address]   Nicki Guadalajara, M.D.  [phone] [address], [address]  [place], [place] [address]  Fax: [phone]   Genene Churn. Love, M.D.  [address]  [place]  [place] [address]  Fax: [phone]   Lucrezia Starch. Earlene Plater, M.D.  [address]  [place]  [place] [address]  Fax: [phone]

## 2010-11-16 NOTE — H&P (Signed)
NAME:  [name] [name], [name] [name]             ACCOUNT NO.:  [id number]   MEDICAL RECORD NO.:  [id number]          PATIENT TYPE:  INP   LOCATION:  0102                         [facility]   PHYSICIAN:  Corinna L. Lendell Caprice, MDDATE OF BIRTH:  01-10-1929   DATE OF ADMISSION:  05/24/2006  DATE OF DISCHARGE:                              HISTORY & PHYSICAL   CHIEF COMPLAINT:  Fevers.   HISTORY OF PRESENT ILLNESS:  [name] [name] is a pleasant 77 year old  white male patient of Dr. Recardo Evangelist who presents to the emergency room  with fevers for several days.  His appetite has been poor.  He has a  history of recurrent urinary tract infections.  He has a history of  paraparesis and neurogenic bladder and self catheterizes four times a  day.  He has had no vomiting,  no diarrhea and really no other symptoms.  He has been compliant with his medications.   PAST MEDICAL HISTORY:  Diabetes, history of thoracic aortic aneurysm  repair and resulting paraparesis and neurogenic bladder, hypertension,  chronic renal insufficiency.   MEDICATIONS:  Glucophage 1000 mg p.o. b.i.d., Glucotrol 10 mg a day,  hydrochlorothiazide 25 mg a day, metoprolol 50 mg p.o. b.i.d., Norvasc  10 mg a day, Tylox as needed, Zetia 10 mg a day, aspirin 81 mg a day,  Actos 10 mg a day, benazepril 40 mg a day.   ALLERGIES:  HE HAS NO KNOWN DRUG ALLERGIES.   SOCIAL HISTORY:  As per previous dictations.   FAMILY HISTORY:  As per previous dictations.   REVIEW OF SYSTEMS:  As above, otherwise negative.   PHYSICAL EXAMINATION:  VITAL SIGNS:  Temperature 101 degrees, blood  pressure 103/61, pulse 115, respiratory rate 19, oxygen saturation 96%  on room air.  GENERAL:  The patient is an ill-appearing white male who has drenched  his bed sheets and T-shirt.  HEENT:  Normocephalic and atraumatic.  Pupils are equal, round and  reactive to light.  Dry mucous membranes.  NECK:  Supple.  No lymphadenopathy.  No JVD.  LUNGS:  Clear to  auscultation bilaterally without wheezes, rhonchi or  rales.  CARDIOVASCULAR:  Regular rate and rhythm without murmurs, gallops or  rubs.  ABDOMEN:  Normal bowel sounds, soft, nontender.  He has a fullness to  the left lower quadrant.  EXTREMITIES:  No clubbing, cyanosis or edema.  GENITOURINARY:  He has a Foley catheter draining concentrated urine.  RECTAL:  Deferred.  EXTREMITIES:  No clubbing, cyanosis or edema.  SKIN:  No rash.  PSYCHIATRIC:  Normal affect.  NEUROLOGICAL:  Alert and oriented.  Cranial nerves and sensory and motor  exam are intact.   LABORATORY DATA:  White blood cell count is 20,000, hemoglobin 11,  hematocrit 32, platelet count normal.  Neutrophils 92%, lymphocytes 3%.  Sodium 139, potassium 4.3, chloride 108, bicarbonate 22, glucose 282,  BUN 50, creatinine 2.0.  His creatinine on October 25th was 1.8.  He had  a hemoglobin A1C done on October 24th which was 6.8.  His UA is turbid,  specific gravity 1.021, pH 5.5, trace ketones, large blood, 100 protein,  negative nitrite, large leukocyte esterase, too numerous to count white  cells, many bacteria.   Chest x-ray shows COPD and nothing acute.   ASSESSMENT AND PLAN:  1. Urinary tract infection:  The patient will be admitted to the floor      and get IV Cipro renally dosed and supportive care.  2. Acute on chronic renal insufficiency.  The patient appears somewhat      dehydrated.  I will give IV fluids, hold his hydrochlorothiazide      and other antihypertensives due to his borderline blood pressures.      I will also hold his Glucophage due to the renal insufficiency.  It      may need to be stopped altogether if his creatinine remains above      1.5.  3. Diabetes.  I will give Glucotrol and Actos as well as sliding scale      insulin and I will give Lantus tonight as his sugar is quite high.  4. Paraparesis and negligible.  5. Hypertension.  6. Dehydration.      Corinna L. Lendell Caprice, MD   Electronically Signed     CLS/MEDQ  D:  05/24/2006  T:  05/24/2006  Job:  [id number]   cc:   Chales Salmon. Abigail Miyamoto, M.D.  Fax: [phone]

## 2010-11-16 NOTE — Discharge Summary (Signed)
NAME:  VANDER, KUEKER NO.:  [id number]   MEDICAL RECORD NO.:  [id number]          PATIENT TYPE:  INP   LOCATION:  2024                         FACILITY:  [facility]   PHYSICIAN:  Salvatore Decent. Dorris Fetch, M.D.DATE OF BIRTH:  11-03-1928   DATE OF ADMISSION:  02/02/2005  DATE OF DISCHARGE:                                 DISCHARGE SUMMARY   Mr. Oelke had been doing well postoperatively and was originally  scheduled for transfer to either the inpatient rehab unit or the subacute  care unit at [facility]; however, he developed mild renal insufficiency with  a creatinine which peaked at 2.2.  Because of this, his ACE inhibitor as  well as his Lasix and Glucophage were held.  He was monitored closely and  over the course of the next 3-4 days, his creatinine has begun to trend back  downward.  He presently is stable at 1.9 with a BUN of 32.  He had also been  previously treated with a 3-day course of Zithromax for presumed bronchitis;  however, after completing his course of antibiotics, he continued to have  purulent sputum with increased oxygen requirements.  His antibiotic coverage  is switched to Cipro, and he has now completed three days of a 7-day course.  He presently is off oxygen and is maintaining O2 sats of greater than 90% on  room air.  His cough has markedly decreased, and sputum production has  almost resolved.  He has otherwise remained stable.  His anticoagulation has  been ongoing, and at the present, his INR is 1.8 with a PT of 20.6.  His  pacing wires have been removed.  He is back down below his preoperative  weight off all diuretics.  He continues to make good progress with cardiac  rehab and physical therapy.  The remainder of his recent labs show a  hematocrit of 27, white count 10.6, platelets 426.  Sodium 138, potassium  4.7.  His blood sugars have also remained fairly stable.  He will have  repeat basic metabolic panel drawn on the morning of February 20, 2005.  Hopefully, his creatinine has remained stable, and there is a bed available  on the subacute care unit.  He will be able to be transferred at that time.   DISCHARGE MEDICATIONS:  1.  Coumadin.  Discharge dose will be determined by PT/INR drawn on the date      of discharge.  2.  Glucotrol 10 mg daily.  3.  Toprol XL 50 mg daily.  4.  Mucinex 1200 mg b.i.d.  5.  Cipro 500 mg q.8h. x5 more days.  6.  Asthma 81 mg daily.  7.  Lotensin 20 mg daily.  8.  Tylox 1-2 q.4h. p.r.n. pain.   The remainder of his discharge instructions and follow-up appointments will  be unchanged from his previously dictated discharge summary.      Coral Ceo, P.A.    ______________________________  Salvatore Decent Dorris Fetch, M.D.    GC/MEDQ  D:  02/19/2005  T:  02/19/2005  Job:  [id number]   cc:   Nicki Guadalajara, M.D.  [address], [address]  [place], [place] [address]  Fax: [phone]   Chales Salmon. Abigail Miyamoto, M.D.  [address]  [place]  [place] [address]  Fax: [phone]

## 2010-11-16 NOTE — H&P (Signed)
NAME:  [name] [name], [name] [name]             ACCOUNT NO.:  [id number]   MEDICAL RECORD NO.:  [id number]          PATIENT TYPE:  INP   LOCATION:  6742                         FACILITY:  [facility]   PHYSICIAN:  Barnetta Chapel, MDDATE OF BIRTH:  20-May-1929   DATE OF ADMISSION:  07/22/2006  DATE OF DISCHARGE:                              HISTORY & PHYSICAL   CHIEF COMPLAINT:  Fever.   HISTORY OIF PRESENTING COMPLAINT:  The patient is a 77 year old male  with past medical history significant for diabetes which is insulin  requiring, coronary artery disease, status post CABG, history of  thoracic aortic aneurysm status post repair, hypertension, chronic renal  insufficiency, paraparesis with neurogenic bladder and recurrent UTI.  Patient presented with three day history of fever and chills.  UA done  in patient's primary care physician's offce which was suggestive of  urinary tract infection.  The patient was sent to the hospital for  further evaluation.  The patient uses straight catheter to void urine.  Therefore, history of urinary frequency or urgency could not be  obtained. No associated headache, neck pain, productive cough, abdominal  pain, nausea, vomiting or change in bowel habits.  No joint pains.   PAST MEDICAL HISTORY:  Essentially as above.   ALLERGIES:  QUESTIONABLE ALLERGY TO STATINS.   MEDICATIONS PRIOR TO ADMISSION:  1. Subcutaneous Lantus insulin 20 units once daily  2. Glucotrol 10 mg p.o. 1 daily  3. HCTZ 25 mg p.o. once daily.  4. Metoprolol 15 mg p.o. b.i.d.  5. Norvasc 10 mg p.o. 1 daily.  6. Zetia 10 mg p.o. 1 daily.  7. Aspirin 81 mg p.o. 1 daily.  8. Actos 15 mg p.o. 1 daily.  9. Benazepril 40 mg p.o. 1 daily.  10.Tylox 1 tablet t.i.d. p.r.n.   SOCIAL HISTORY:  The patient is married with children.  He denied the  use of alcohol, illicit drugs or cigarette abuse.   FAMILY HISTORY:  Non-contributory.   REVIEW OF SYSTEMS:  Essentially as above.   PHYSICAL  EXAMINATION:  VITAL SIGNS:  On admission temperature 97.5,  blood pressure 110/50 mmHg, heart rate is 75 and respiratory rate is 18.  GENERAL:  The patient is quite the pleasant fellow.  Not in any acute  distress.  HEENT:  No pallor, no jaundice, extraocular movements intact.  LUNGS:  Clear to auscultation.  CV:  S1, S2.  No heart murmur appreciated.  ABDOMEN:  Soft and benign.  No organomegaly.  Bowel sounds are present.  NEURO:  The strength in both lower limbs is at least 3+/5.  EXTREMITIES:  No edema.   LABORATORY DATA:  Sodium is 129, potassium is 4.3, chloride is 103, CO2  is 23, BUN is 54 with creatinine of 1.9, blood sugar 306.  White blood  count is 12.0, hemoglobin 11.6, hematocrit of 34.6, MCV of 92.3, and  platelet count of 361.   ASSESSMENT:  1. Urinary tract infection, recurrent.  2. Acute on chronic renal failure.  3. Paraparesis.  4. History of diabetes mellitus.  5. History of coronary artery disease.  6. History of hypertension.  PLAN:  We will admit the patient to the regular medical floor.  We will  start the patient on IV ceftriaxone 1 gram 1 daily.  We will send urine  and blood for culture. Will follow renal function.  We will aggressively  rehydrate the patient.  Will control the patient's blood sugar during  his stay in the hospital.  Further management will depend on hospital  course.      Barnetta Chapel, MD  Electronically Signed     SIO/MEDQ  D:  07/22/2006  T:  07/23/2006  Job:  [id number]

## 2010-11-16 NOTE — Consult Note (Signed)
NAME:  [name] [name], [name] [name] NO.:  [id number]   MEDICAL RECORD NO.:  [id number]          PATIENT TYPE:  INP   LOCATION:  2019                         FACILITY:  [facility]   PHYSICIAN:  Kerin Perna, M.D.  DATE OF BIRTH:  1928/09/10   DATE OF CONSULTATION:  02/04/2005  DATE OF DISCHARGE:                                   CONSULTATION   REASON FOR CONSULTATION:  Severe three vessel coronary artery disease, class  I stable angina, subendocardial myocardial infarction.   CHIEF COMPLAINT:  Chest pain.   HISTORY OF PRESENT ILLNESS:  I was asked to evaluate this 77 year old white  male diabetic for potential surgical coronary revascularization for recently  diagnosed severe three vessel coronary artery disease.  The patient had an  onset of substernal squeezing chest pain on the night prior to his admission  on August 5.  This was at rest and was recurrent through the night.  He was  taken to the [facility] Emergency Department where an EKG was checked and  was normal but the cardiac enzymes were positive.  His troponin was 1.7 with  a CPK MB of 10.9.  He was admitted to the hospital and placed on  nitroglycerin, heparin, and followed with serial enzymes which were mildly  positive.  He has had no recurrent chest pain since his hospitalization.  He  was taken to the cath lab today by Dr. Elsie Lincoln where a diagnostic left heart  cath was performed.  This indicated severe three vessel coronary artery  disease with a 95% stenosis of the LAD, 99% stenosis of the distal right  coronary, and 70% stenosis of the circumflex.  His EF was 60% and left  ventricular end diastolic pressure was 14 mmHg.  Because the patient's  coronary disease was multivessel involvement with a diabetic pattern, a  surgical evaluation was requested.   The patient has a significant cardiac history in that he underwent emergency  resection grafting of the ascending thoracic aorta by Dr. Dorris Fetch in  March  2004.  At that time, the patient presented in tamponade from a  penetrating ulcer of the posterior wall with a 5 cm ascending aorta.  The  patient presented in tamponade and did not undergo a diagnostic cardiac  catheterization.  The aortic valve was inspected at the time of surgery and  it was found to have some mild leaflet thickening but was competent and was  not replaced.  The patient had a slow recovery but has done well from a  cardiac standpoint since that surgery.  It should be noted the patient had  the distal anastomosis just at the proximal aortic arch and a period of  circulatory arrest was utilized during the emergency procedure with access  for cardiopulmonary bypass via the left femoral artery.   PAST MEDICAL HISTORY:  1.  Diabetes mellitus.  2.  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 Haskell Riling.  3.  Abdominal aortic aneurysm by cardiac catheterization.  4.  No known allergies.  HOME MEDICATIONS:  Glucophage, Glucotrol, Coumadin, Toprol XL, HCTZ,  Norvasc, Benazepril, hydrochloride.  He also takes Tylox chronically for  lower extremity pain.   SOCIAL HISTORY:  The patient is married and retired.  He drinks one glass  wine per day and does smoke 1/2 pack per day.   FAMILY HISTORY:  Negative for coronary artery disease.   REVIEW OF SYMPTOMS:  The patient has significant difficulty with his  paraparesis from the spinal cord tumor and surgery.  He has no bladder  control and self-caths and has chronic UTIs.  He walks with a walker but is  not very mobile.  He also has a history of an injury to his left foot when  he was a child in an elevator and his left leg is shorter and malformed.  The patient has chronic bronchitis symptoms with productive cough and some  intermittent URIs.  He denies any recent hospitalizations for pneumonia.  His GI symptoms at this point are minimal without history of GI blood loss.  His  hematologic review is negative for bleeding despite his long term  Coumadin use which was probably for postop atrial fibrillation following the  ascending aneurysm grafting in 2004.  Control of his diabetes is unknown and  a hemoglobin A1C is pending.  Neurologic status under than the lower  extremity weakness is without stroke or seizure.   PHYSICAL EXAMINATION:  VITAL SIGNS:  The patient is 6 feet, weight 180 pounds, blood pressure  160/72, pulse 60, respirations 18, afebrile.  GENERAL:  Alert and oriented white male who appears his stated age.  HEENT:  Normocephalic, dentition good, pharynx clear.  NECK:  Without JVD, mass, or carotid bruit.  LYMPHATICS:  Reveal no palpable cervical or supraclavicular adenopathy.  LUNGS:  Clear.  He has a well healed sternal incision.  CARDIAC EXAM:  Normal rhythm with normal S1 and S2 and there is no cardiac  murmur.  ABDOMEN:  Soft, without mass or organomegaly.  EXTREMITIES:  Atrophic changes and chronic deformity of the left foot and  ankle.  He has a well healed left groin incision and he has palpable femoral  pulses bilaterally.  I feel no pedal pulses in either lower extremity.  NEUROLOGIC:  While he is at bedrest, no upper extremity or cranial nerve  abnormality.  He has bilateral lower leg weakness.   LABORATORY DATA:  I reviewed the coronary arteriograms with Dr. Elsie Lincoln.  His  INR is trending down from 2.2 to 1.8.  A creatinine is 1.1 with BUN 18 and  hematocrit 36.  His cholesterol is 154.  Hemoglobin A1C is pending.   IMPRESSION AND PLAN:  The patient has significant coronary disease which is  critical.  He has preserved LV function.  He has had a resection and  grafting of his ascending aorta with a history circulatory arrest.  His  targets for grafting are variable in that the LAD appears to be a very  adequate target, the circumflex marginal is probably adequate, and the distal right coronary artery appears to be a poor target.  We  will proceed  with an MRI/MRA of his thoracic and abdominal aorta as it does appear he has  an abdominal aneurysm and information for cannulation for possible redo CABG  will be obtained by an MRI angiogram of his thoracic and abdominal aorta.  We will proceed with vein mapping and the usual pre-CABG Dopplers, as well.  I briefly discussed the surgical possibilities with the patient which  would  include a range of procedures from an off pump single left IMA to the LAD  versus multivessel  grafting with full cardiopulmonary bypass.  We will follow up with the  patient tomorrow to review the results of testing and to further discuss  surgical options.   Thank you for the consultation.       PV/MEDQ  D:  02/04/2005  T:  02/04/2005  Job:  [id number]   cc:   Madaline Savage, M.D.  [address]  [place]  [place] [address]  Fax: [phone]   Nicki Guadalajara, M.D.  [phone] [address]  [place], [place] [address]  Fax: [phone]   Chales Salmon. Abigail Miyamoto, M.D.  [address]  [place]  [place] [address]  Fax: [phone]

## 2010-11-16 NOTE — H&P (Signed)
NAME:  AVERI, CACIOPPO             ACCOUNT NO.:  [id number]   MEDICAL RECORD NO.:  [id number]          PATIENT TYPE:  INP   LOCATION:  1332                         FACILITY:  [facility]   PHYSICIAN:  Corinna L. Lendell Caprice, MDDATE OF BIRTH:  1928-09-29   DATE OF ADMISSION:  04/23/2006  DATE OF DISCHARGE:                                HISTORY & PHYSICAL   CHIEF COMPLAINT:  Is vomiting.   HISTORY OF PRESENT ILLNESS:  Mr. Ebert is a pleasant, 77 year old white  male with history of diabetes who was being treated as an outpatient with  Cipro for urinary tract infection.  He started having fevers and chills  about a week ago and subsequently had vomiting and diarrhea.  He reportedly  had black watery stools, and I am told that his hemoccult was negative here  in the emergency room, although I see no documentation.  He has been able to  keep some food down today.  He reports that he has been unable to take his  medications due to the vomiting, but reported compliance prior to this week.  He has been started on a insulin drip here in the emergency room for a blood  glucose of greater than 400.   PAST MEDICAL HISTORY:  Type 2 diabetes with DKA back in February 2007,  hypertension, history of ruptured ascending aortic aneurysm with repair,  history of neurogenic bladder and areflexia apparently related to his  surgery, BPH.   MEDICATIONS:  1. Actos 15 mg a day.  2. Aspirin 81 mg a day.  3. Benazepril 40 mg a day.  4. Ciprofloxacin 500 mg p.o. b.i.d.  5. Glucophage 1,000 mg p.o. b.i.d.  6. Glucotrol 10 mg a day.  7. Hydrochlorothiazide 25 mg day.  8. Metoprolol 50 mg p.o. b.i.d.  9. Norvasc 10 mg a day.  10.Tylox as needed.  11.Zetia 10 mg a day.   ALLERGIES:  NO KNOWN DRUG ALLERGIES.   SOCIAL HISTORY:  He quit smoking.  He drinks occasionally.   FAMILY HISTORY:  Is noncontributory.   REVIEW OF SYSTEMS:  As above, otherwise negative.   PHYSICAL EXAMINATION:  VITAL SIGNS:  His  temperature is not recorded, blood  pressure 126/73, pulse initially 138, respiratory rate 20, oxygen saturation  96% on room air.  GENERAL:  The patient is well-nourished, well-developed, in no acute  distress.  HEENT:  Normocephalic, atraumatic.  Pupils equal, round, reactive to light.  Sclerae nonicteric.  Dry mucous membranes.  NECK:  Is supple.  No lymphadenopathy.  No JVD or thyromegaly.  LUNGS:  Clear to auscultation bilaterally without wheezes, rhonchi or rales.  CARDIOVASCULAR:  Regular rate and rhythm without murmurs, gallops, rubs.  ABDOMEN:  Normal bowel sounds, soft, nontender, nondistended.  GU/RECTAL:  Deferred.  EXTREMITIES:  No clubbing, cyanosis or edema.  SKIN:  No rash.  PSYCHIATRIC:  Normal affect.  NEUROLOGIC:  Alert and oriented.  Cranial nerves and sensorimotor exam are  intact.   LABORATORY:  CBC is significant for white blood cell count of 11.6.  Sodium  is 130, potassium 4.2, chloride 99, bicarbonate 18, glucose 436, BUN 95,  creatinine 1.9.  UA shows 15 ketones, trace blood, negative protein,  negative nitrite, moderate leukocyte esterase, 3-6 white cells, few  bacteria.   ASSESSMENT/PLAN:  1. Uncontrolled diabetes with increased anion gap acidosis, suspect mild      diabetic ketoacidosis.  I will check a serum acetone and lactic acid.      He will continue the insulin drip, and I will check a hemoglobin A1c.      He will get IV fluids and supportive care.  2. Urinary tract infection.  Cipro may be contributing to his vomiting and      diarrhea was switched to Rocephin and order a urine culture.  3. Dehydration/prerenal azotemia.  I will hold his metformin ACE      inhibitor, give IV fluids.  4. Hypertension.  Continue metoprolol and Norvasc, hold      hydrochlorothiazide due to the dehydration.  5. Neurogenic bladder.      Corinna L. Lendell Caprice, MD  Electronically Signed     CLS/MEDQ  D:  04/23/2006  T:  04/24/2006  Job:  [id number]   cc:   Chales Salmon. Abigail Miyamoto, M.D.

## 2010-11-16 NOTE — H&P (Signed)
NAME:  [name] [name], [name] [name] NO.:  [id number]   MEDICAL RECORD NO.:  [id number]          PATIENT TYPE:  INP   LOCATION:  5501                         FACILITY:  [facility]   PHYSICIAN:  Kela Millin, M.D.DATE OF BIRTH:  02-24-1929   DATE OF ADMISSION:  08/05/2005  DATE OF DISCHARGE:                                HISTORY & PHYSICAL   ADDENDUM:  Patient reports that he self catheterizes at home secondary to  his neurogenic bladder and states that he has done so for the past 5 years.   PAST MEDICAL HISTORY:  1.  As stated above.  2.  History of ruptured ascending aortic aneurysm, status post surgery,      median sternotomy with resection and grafting.  3.  BPH.  4.  History of dyslipidemia.  5.  History of hypertension.   MEDICATIONS:  1.  Glucophage 100 mg p.o. b.i.d.  2.  Toprol XL 100 q.a.m.  3.  HCTZ 25 q.a.m.  4.  Norvasc 10 mg q.d.  5.  Benazpril 40 mg q.a.m.  6.  Melatonin.  7.  Tylox.   ALLERGIES:  NKDA.   SOCIAL HISTORY:  Says that he quit tobacco 3 weeks ago and states that for  the past 2 months he has had not any wine, which he previously would drink  at least a glass of wine at night.   FAMILY HISTORY:  Younger brother deceased recently with ?diabetes.   REVIEW OF SYSTEMS:  As per HPI.   PHYSICAL EXAMINATION:  GENERAL APPEARANCE:  The patient is an elderly white  male, somnolent, in no respiratory distress.  VITAL SIGNS:  His blood pressure is 135/57, with a temperature of 97, pulse  of 97, respiratory rate 18, O2 sat of 96%.  HEENT:  PERRL, EOMI, sclerae anicteric, very dry mucous membranes, no oral  exudates.  NECK:  Supple, no adenopathy, no thyromegaly, no carotid bruits appreciated.  LUNGS:  Moderate air movement, no crackles, no wheezes.  CARDIOVASCULAR:  Regular rate and rhythm, normal S1, S2.  ABDOMEN:  Soft, bowel sounds present, nontender, nondistended, no  organomegaly, no masses palpable.  EXTREMITIES:  No cyanosis, no edema.  NEURO:  Somnolent, oriented x3, cranial nerves II-XII grossly intact. The  strength in his lower extremities 4/5 and in his upper extremities 4 to 5/5.   LABORATORY DATA:  His sodium is 133, potassium 5.7, chloride is 93, with a  CO2 of 15, glucose 16. His BUN is 62, with a creatinine of 2.5. Total  bilirubin of 2.9, alkaline phosphatase is 80, SGOT 10, SGPT 20, total  protein 6.7. His albumin is 3.3, calcium 9.4 and his white cell count is  8.9, with a hemoglobin of 12.8, hematocrit of 37.4, platelet count 288,  neutrophil count of 84% and his CK total is 75, CK MB 3.4. His troponin is  0.03.   ASSESSMENT AND PLAN:  1.  Probable diabetic ketoacidosis- awaiting arterial blood gas, as well as      serum ketones and urinalysis. Elderly patient, presenting with      polydipsia and polyuria, with a blood glucose of  610 and acidotic, with      a CO2 of 15. I will start the patient on IV insulin per Glucometer      protocol. I will hydrate the patient aggressively and follow. I will      hold off Glucophage at this time. Will obtain a chest x-ray, as well as      UA with culture and sensitivity, as well as cardiac enzymes and serum      amylase and lipase to evaluate for precipitating factors.  2.  Volume depletion- hydrate and recheck as above.  3.  Acute renal failure- likely secondary to above. Will hold off ACE      inhibitor for now. Follow and further evaluate as appropriate, if no      improvement with hydration.  4.  Coronary artery disease- status post coronary artery bypass graft, check      cardiac enzymes as above, receive Toprol in the a.m., holding ACE      inhibitor for now as above.  5.  History of hypertension- follow and continue Norvasc and Toprol.  6.  Dyslipidemia- the patient has refused studies in the past and per office      notes was on Zetia, but not listed on his current medications, follow.  7.  History of paroxysmal atrial fibrillation- postoperative after a       coronary artery bypass graft only, no further episodes. Per patient,      Coumadin was discontinued secondary to a persistent hematuria.  8.  Neurogenic bladder, with areflexia - the patient self catheterizes as      noted above, obtain a urinalysis with culture and sensitive and treat as      appropriate. Will place a Foley catheter.  9.  Thoracic aneurysm- status post repair in March of 2004.      Kela Millin, M.D.  Electronically Signed     ACV/MEDQ  D:  08/06/2005  T:  08/06/2005  Job:  [id number]   cc:   Chales Salmon. Abigail Miyamoto, M.D.  Fax: [phone]

## 2010-11-16 NOTE — Consult Note (Signed)
[facility]  Patient:    [name] [name], [name] [name] Visit Number: [id number] MRN: [id number]          Service Type: [facility] Location: [id number] Attending Physician:  Herold Harms Dictated by:   Genene Churn. Love, M.D. Proc. Date: 02/23/01 Adm. Date:  02/20/2001                            Consultation Report  PATIENTS ADDRESS:  [address] [place], [place]  [address]  DATE OF BIRTH:  1928/12/23  IDENTIFICATION:  This 77 year old right-handed white married male from [place], [place], was transferred on February 20, 2001, to [facility] from [facility] for evaluation and rehabilitation treatments of paraparesis.  HISTORY OF PRESENT ILLNESS:  Mr. Albea has a past history of lower back pain radiating down his right leg and underwent L4-L5 lumbar laminotomy and microdiskectomy and synthesis PLIF spacers and right iliac crest bone graft and bilateral L4-5 posterolateral arthrodesis with surgical dynamics and posterior instrumentation in February 2002.  Following surgery he did very well but then sometime in March or April developed increasing right foot and leg weakness with numbness, obstipation, bladder incontinence, followed by left leg weakness.  He was found to have evidence of a severe neuropathy, right sural nerve and muscle biopsy showing evidence of a neuropathic procedure without associated amyloidosis or vasculitis present.  He was also found to have evidence of a bronchial lesion and underwent surgical evaluation with negative biopsy, felt to be a benign disorder.  Initial MRI study of Nov 10, 2000, of the lumbar spine showed no evidence of cord ischemia and some mild stenosis at the L3 level.  He subsequently had a myelogram which did not reveal a definite AVM on Nov 24, 2000.  This was a total myelogram.  It did show patency of the fusion at the L4-5 level and moderate  stenosis at L3-4 and L5-S1 due to protruding disk and ligamentous flavum hypertrophy.  He then underwent an arteriogram for the possibility of an AVM which showed no definite abnormality, and he then was seen at [facility] by Dr. Sonia Baller, neuroradiologist, for repeat arteriogram, showing evidence of a spinal dural AVM from the lower spinal region.  He underwent surgery by Dr. Lavonna Monarch on February 16, 2001, and is now being seen at the rehabilitation center at [facility].  He initially developed right leg and then left leg weakness with numbness.  He had bowel and bladder incontinence. Postoperatively he has had some lower back pain and nonradicular pain.  He has been managing himself with self-catheterization procedure for approximately five months.  He has been deconditioned and is now on the rehabilitation service.  PAST MEDICAL HISTORY: 1. Neurogenic bladder. 2. BPH. 3. Diabetes mellitus. 4. Hypertension. 5. Benign chest lesion.  ADMISSION MEDICATIONS: 1. Glucotrol XL 10 mg q.d. 2. Enalapril 10 mg once per day. 3. Glucophage 500 mg b.i.d. 4. Multivitamins once per day. 5. Aspirin per day.  PHYSICAL EXAMINATION:  GENERAL:  Well-developed, thin white male in no acute distress.  VITAL SIGNS:  Blood pressure in right and left arm was 140/80, heart rate was 100 and regular.  NECK:  There were no bruits heard.  Flexion and extension maneuvers were unremarkable.  NEUROLOGIC:  He was alert and oriented x 3, followed one-, two-, and three-step commands.  His cranial nerve examination revealed visual fields full,  discs flat, spontaneous venous pulsations seen.  Extraocular movements full.  Corneals present.  There is a seventh nerve palsy present.  Hearing intact.  Air conduction greater than bone conduction.  Tongue midline.  Uvula midline.  Gag is present.  Old left foot deformity.  On motor examination with 5/5 in the upper extremities, gluteus maximum left  versus rightwards 4++/4-, gluteus medius 4++/4+, EHL 4/0 anterior tibialis 4/0, peroneal 4/0, hamstrings 4/4-, iliac psoas 4+/4-, adductor magnus 4/4.  He had some gastrocnemius ability in his right foot with 1-2/5 and could move the toes and plantar flexion of his right foot.  He had decreased pinprick to his knees, decreased vibration to his knees, absent ankle jerks, absent knee jerks, downgoing plantar responses.  IMPRESSION: 1. Spinal dural AVM with paraparesis, bowel and bladder incontinence and    myelopathy, code 721.1. 2. Diabetes mellitus with peripheral neuropathy, code 357.2. 3. Status post L4-L5 laminectomy, code 724.4. 4. Recurrent urinary tract infections, code 599.0. 5. Kidney stones. 6. Benign pulmonary lesion.  PLAN:  Follow the patient in the hospital on the rehabilitation service.Dictated by:   Genene Churn. Love, M.D. Attending Physician:  Herold Harms DD:  02/23/01 TD:  02/23/01 Job: [id number]

## 2010-11-16 NOTE — Consult Note (Signed)
[place]. [facility]  Patient:    [name] [name], [name] [name]                    MRN: [id number] Proc. Date: 01/14/01 Adm. Date:  [id number] Disc. Date: [id number] Attending:  Erich Montane CC:         Genene Churn. Love, M.D.   Consultation Report  HISTORY OF PRESENT ILLNESS:  The patient is a 77 year old right-handed white male who has been a patient of mine for over six months.  He is status post L4-5 lumbar decompression and arthrodesis on August 07, 2000.  He recovered well from that surgery but in early April began to notice some subtle weakness of his right foot.  His weakness has progressed and he has developed increasing weakness of the right lower extremity, subsequently of the left lower extremity.  MRI of the lumbar spine did not show any evidence of neural compression and neurology consultation was obtained from Dr. Sandria Manly.  Dr. 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  Workup has included, but not been limited to, EMG and nerve conduction studies, lumbar puncture and spinal fluid analysis, complete myelogram, complete MRI, spinal arteriography, biopsy of a lung lesion, muscle nerve biopsy, and extensive laboratories.  The patient was admitted yesterday in anticipation of proceeding with spinal arteriogram today to evaluate the possibility of a suspected spinal dural arteriovenous fistula.  PAST MEDICAL HISTORY:  Notable for history of hypertension treated for about the past five years, history of diabetes mellitus treated for the past four years.  No history of myocardial infarction, cancer, stroke, peptic ulcer disease.  PAST SURGICAL HISTORY:  Lumbar surgery in February 2002, spinal surgery in [place] in March 1986, left foot surgery in 1937 at the age of 7 for a traumatic injury that has left  him with a deformed left foot and atrophy of the left foot and left leg.  ALLERGIES:  None.  MEDICATIONS:  Glucophage, Glucotrol XL, Vasotec, aspirin.  SOCIAL HISTORY:  Patient smokes about a half a pack a day.  He is retired.  He is married.  He did smoke more heavily in the past.  He drinks alcoholic beverages socially.  He does not have a history of substance abuse.  REVIEW OF SYSTEMS:  Notable for those described in his history of present illness and past medical history, but is otherwise unremarkable.  PHYSICAL EXAMINATION  GENERAL:  Patient is a well-developed, but thin, white male in no acute distress.  VITAL SIGNS:  Temperature 97.4, pulse 78, blood pressure 160/97, respiratory rate 18.  EXTREMITIES:  Chronic deformity of the left foot due to his traumatic injury as a 89-year-old.  He has notable atrophy of the musculature of the lower extremities.  NEUROLOGIC:  Motor examination shows ileus psoas to be 3 on the left, 2 on the right, quadriceps to be 4+/5 on the left and 4- on the right, dorsiflexure is 4 on the right, 0 on the right, plantar flexure 4+ on the right and 4+/5 on the right.  LABORATORIES:  Spinal arteriogram was performed today.  I reviewed what films were available, although all of the films may not have been present and Dr. Corliss Skains was not available at the time.  However, there was no apparent spinal AVM or AV fistula on the studies seen and Dr. Reginia Forts note indicates that  he did not find an AVM or AV fistula.  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  RECOMMENDATIONS:  I agree with Dr. Alena Bills plan to proceed with review of his arteriogram.  Dr. Sandria Manly knows that I will be out of town for the next two weeks and that my partner, Dr. Coletta Memos will be available if neurosurgical issues should arise.  Additionally, the patient knows that I will be out of town as well and has had the  opportunity to meet Dr. Franky Macho. DD:  01/14/01 TD:  01/15/01 Job: [id number]

## 2010-11-16 NOTE — Consult Note (Signed)
NAME:  [name] [name], [name] [name]             ACCOUNT NO.:  [id number]   MEDICAL RECORD NO.:  [id number]          PATIENT TYPE:  INP   LOCATION:  2019                         FACILITY:  [facility]   PHYSICIAN:  Casimiro Needle L. Reynolds, M.D.DATE OF BIRTH:  [birth year]   DATE OF CONSULTATION:  02/06/2005  DATE OF DISCHARGE:                                   CONSULTATION   REQUESTING PHYSICIAN:  Dr. Chanda Busing   REASON FOR EVALUATION:  Paraparesis.   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  Patient had surgery on the AV fistula by  Dr. Velna Ochs in August of 2002.  Since that time he has been  neurologically stable with remaining deficits including weakness in the  distal right lower extremity and significant large and small fiber sensory  loss in the lower extremities.  He ambulates with the assistance of a  walker, has significant problems with constipation, and performs self  catheterization four times a day, otherwise being incontinent of urine.  He  was last seen in our office by Dr. Sandria Manly in April of 2003.  The patient has  known coronary artery disease.  Was admitted with angina and found at the  time of cardiac catheterization to be an appropriate candidate for coronary  artery bypass grafting.  We are subsequently consulted for preoperative  assessment.  Patient says that his neurologic condition has remained quite  stable over the last three years.  He seems to have intermittent pain which  is somewhat migratory in the lower extremities for which he  takes Motrin and  Tylox.  He continues to ambulate with a walker around the house and again,  feels that his deficits have been entirely stable for the last three to four  years.   PAST MEDICAL HISTORY:  As above.  He also has a history of diabetes with an  element of peripheral neuropathy.  He has a history of a thoracic aortic  aneurysm which was emergently repaired in March of 2004 by Dr. Dorris Fetch  at which time he presented in tamponade for a penetrating ulcer at the  posterior wall.  He also has a known abdominal aortic aneurysm.   FAMILY HISTORY:  As outlined in the admission H&P of February 02, 2005 and the  subsequent consult notes.   SOCIAL HISTORY:  As outlined in the admission H&P of February 02, 2005 and the  subsequent consult notes.   REVIEW OF SYSTEMS:  As outlined in the admission H&P of February 02, 2005 and  the subsequent consult notes.   MEDICATIONS:  Prior to admission he was taking Glucophage, Glucotrol,  Coumadin, HCTZ, Toprol, Norvasc, and benazepril as well as his  Motrin and  Tylox.  He has also been on aspirin in the hospital as well as intravenous  heparin, nitroglycerin, and sliding scale insulin.   PHYSICAL EXAMINATION:  VITAL SIGNS:  Temperature 97.1, blood pressure  139/74, pulse 63, respirations 18, O2 saturation 96% on room air, CBG 84 at  1700.  GENERAL:  This is a healthy-appearing man in hospital bed in no evident  distress.  HEENT:  Cranium is normocephalic, atraumatic.  Oropharynx benign.  NECK:  Supple without carotid.  No supraclavicular bruits.  HEART:  Regular rate and rhythm without murmurs.  NEUROLOGIC:  Mental status:  He was awake, alert, fully oriented to time,  place, and person.  Recent and remote memory are intact.  Attention span,  concentration, fund of  knowledge are all appropriate.  Speech is fluent and  not dysarthric, but no defects to confrontational naming.  He can repeat a  phrase.  Mood is euthymic and affect appropriate.   Cranial nerves:  Pupils  are equal and reactive.  Extraocular movements full without nystagmus.  Face, tongue, and palate move normally and symmetrically.  Facial sensation  is intact to pin prick.  Shoulder shrug strength is normal.  Motor testing:  Normal bulk and tone.  He has normal strength in the upper extremities.  The  right lower extremity:  There is a bit of weakness of dorsiflexion of the  foot and toes due to chronic joint malformation but strength is otherwise  good.  In the right lower extremity strength is as follows:  Hip flexors 4+,  quadriceps full, dorsiflexors 2, plantar flexors 3, hamstrings 4+.  He has  some wiggling of the toes mostly in flexion, not really in extension.  Sensation:  Diminished light touch and pin prick sensation worse over the  right leg compared to the left mostly in a distal distribution more  affecting the distal and posterior lower extremities.  Coordination:  Finger-  nose is performed adequately.  He has difficulty with heel-shin testing,  does a little bit better with visual reinforcement.  Gait is deferred as the  patient does not have a walker with him at this time.  Reflexes:  2+ upper  extremities, 1+ knees, absent ankles.  Toes are downgoing.   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  He has  neurologically been quite stable through that time even through an abdominal  aortic aneurysm repair and his examination is stable compared to his last  note in April of 2003 when he was last seen in our office.   RECOMMENDATIONS:  He is cleared for coronary artery bypass surgery.  I will  alert Dr. Sandria Manly of his admission.      Michael L. Thad Ranger, M.D.  Electronically Signed     MLR/MEDQ  D:  02/06/2005  T:  02/07/2005  Job:  [id number]

## 2010-11-16 NOTE — Discharge Summary (Signed)
NAME:  [name] [name], ROPP             ACCOUNT NO.:  [id number]   MEDICAL RECORD NO.:  [id number]          PATIENT TYPE:  INP   LOCATION:  6742                         FACILITY:  [facility]   PHYSICIAN:  Corinna L. Lendell Caprice, MDDATE OF BIRTH:  1929/06/04   DATE OF ADMISSION:  07/22/2006  DATE OF DISCHARGE:  07/25/2006                               DISCHARGE SUMMARY   DISCHARGE DIAGNOSES:  1. Urinary tract infection. Urine culture grew Klebsiella pneumonia,      recurrent urinary tract infections.  2. Acute renal insufficiency secondary to prerenal azotemia..  3. Neurogenic bladder.  4. Hypertension.  5. Chronic renal insufficiency.  6. Diabetes.  7. History of thoracic aortic aneurysm repair and resulting      paraparesis.   DISCHARGE MEDICATIONS:  1. Increase Lantus to 22 units subcutaneously daily.  2. Ciprofloxacin 500 mg p.o. twice a day for another 10 days after      which time he is to start Macrodantin 50 mg nightly indefinitely.      He is also to take urologic antibiotic irrigant 10 cc via catheter      into the bladder twice a week. He is to hold his      hydrochlorothiazide possibly indefinitely, but I will defer to his      primary care physician.  3. Continue Actos 15 mg daily.  4. Glucotrol 10 mg a day.  5. Metoprolol 50 mg p.o. b.i.d.  6. Norvasc 10 mg a day.  7. Zetia 10 mg a day.  8. Aspirin 81 mg a day.  9. Benazepril 40 mg a day.  10.Tylox as needed.   CONSULTATIONS:  Dr. Gaynelle Arabian.   PROCEDURES:  None.   DIET:  Should be diabetic, low salt.   ACTIVITY:  Continue ambulation with walker. Follow up with Dr. Abigail Miyamoto  in two weeks at which time a BMET should be drawn to monitor his renal  function.   PERTINENT LABORATORY DATA:  CBC on admission was significant for white  blood cell count 12,000, hemoglobin 11.6, hematocrit 34.6; otherwise,  normal. Basic metabolic panel on admission showed glucose 206, BUN 54,  creatinine 1.97. The day of discharge BUN 33,  creatinine 1.5. Liver  function tests unremarkable. Magnesium, phosphorus unremarkable.  Hemoglobin A1c 7.6. LDL 85, HDL 29, total cholesterol 981. UA on  admission showed pH 5.5, specific gravity 1.018, negative glucose,  negative ketones, trace blood, 30 protein, positive nitrite, large  leukocyte esterase, no white cells were seen, few bacteria. Urine  culture grew out greater than 100,000 colonies of Klebsiella pneumonia  which was resistant to ampicillin but sensitive to all other  antibiotics. Blood culture was negative. Chest x-ray on admission showed  emphysema without acute disease.   HISTORY AND HOSPITAL COURSE:  Mr. Loveall is a pleasant 77 year old  white male patient of Dr. Abigail Miyamoto with recurrent urinary tract  infections. He has a history of diabetes and neurogenic bladder for  which he self catheterizes three times a day or more. He had been seen  by Dr. Abigail Miyamoto and found to have a urinary tract infection and was  therefore admitted  to the hospital. He also was found to have acute on  chronic renal insufficiency. He was started on IV Rocephin and IV  fluids. Due to the recurrent nature of the problem he had requested a  neurology consult. He is known to Dr. Earlene Plater. Dr. Earlene Plater recommended  cranberry juice as an outpatient as well as chronic Macrodantin therapy  and bladder irrigations. Due to the elevated creatinine the patient's  hydrochlorothiazide was held. His creatinine improved as did his overall  strength and malaise. He had no fevers while here. On the day of  discharge he had potassium 5.5, but when it was repeated it was normal  at 4.4. This must have been a lab error/erroneous result. The patient  will need follow-up with Dr. Abigail Miyamoto to monitor his creatinine  particularly now that he is on Macrodantin. Total time on the day of  discharge is 40 minutes.      Corinna L. Lendell Caprice, MD  Electronically Signed     CLS/MEDQ  D:  07/25/2006  T:  07/25/2006  Job:   [id number]   cc:   Chales Salmon. Abigail Miyamoto, M.D.  Ronald L. Earlene Plater, M.D.

## 2010-11-16 NOTE — Discharge Summary (Signed)
NAME:  [name] [name], [name] [name]             ACCOUNT NO.:  [id number]   MEDICAL RECORD NO.:  [id number]          PATIENT TYPE:  INP   LOCATION:  1332                         [facility]   PHYSICIAN:  Jackie Plum, M.D.DATE OF BIRTH:  28-Aug-1928   DATE OF ADMISSION:  04/23/2006  DATE OF DISCHARGE:  04/26/2006                                 DISCHARGE SUMMARY   DISCHARGE DIAGNOSES:  1. Acute renal failure, improved.  2. Urinary tract infection.  3. Diabetes mellitus.  4. Hypertension.   DISCHARGE MEDICATIONS:  1. Ceftin 250 mg b.i.d.  2. Reglan 5 mg before meals.  3. The patient was asked to hold his benazepril and hydrochlorothiazide      until he follows up with his PCP in 1 week.   FOLLOWUP:  He was asked to follow up with his PCP, Dr. Pricilla Holm, in 1 week and  have creatinine checked on followup.  He was asked to call MD if he has any  problems including vomiting, fever or pain.   REASON FOR ADMISSION:  Acute renal failure-appearing picture with  dehydration and uncontrolled diabetes mellitus with increased anion gap  suspicious of mild diabetic ketoacidosis.  The patient was admitted to the  hospital after presenting with vomiting, nausea and diarrhea.  He has been  treated for a UTI with Cipro by his PCP.  He had reported some black watery  stools.  On admission, the patient was noted to be hyponatremic with sodium  of 130 and a bicarbonate of 18, indicating mild metabolic acidosis.  His  glucose was 436.  His UA was positive for a urinary tract infection.  His  abdomen was soft and cardiopulmonary was unremarkable.  He was therefore  admitted for possible early mild DKA with UTI which was incompletely treated  and in addition to prerenal azotemia.  For full details regarding the  patient's presentation, please review the H&P dictated by Dr. Crista Curb on April 23, 2006.   HOSPITAL COURSE:  On admission, the patient was started on antidiabetic  regimen with IV  fluids and antibiotics.  His renal function was monitored  with some improvement.  The patient's BUN and creatinine were 23 and 1.1 on  the 7th of February 2007, but jumped up to a BUN of 95 and creatinine of 2.0  on the date of admission.  Careful hydration was given to the patient.  Antibiotics were discontinued.  He had problems with hypoglycemia and we had  to readjust his antidiabetic regimen.  The patient remained hemodynamically  stable.  On April 26, 2006, the patient was seen by Dr. Donnalee Curry.  At that time on exam, the patient's lungs were clear.  His vital signs were  stable without any fever.  His abdomen was soft and nontender.  Bowel sounds  were present.  He had a regular rate and rhythm and his H&H had remained  fairly stable without any problems and she discharged the patient home to  follow up with his PCP, Dr. Pricilla Holm, as noted above, and the patient was said  to have been discharged in stable and satisfactory  condition.  The patient's  lab work on April 24, 2006 indicated sodium of 140, potassium 3.4,  chloride of 112, CO2 24, glucose 226, BUN 39, creatinine 1.8, calcium 8.0.      Jackie Plum, M.D.  Electronically Signed     GO/MEDQ  D:  05/15/2006  T:  05/15/2006  Job:  [id number]

## 2010-11-16 NOTE — Op Note (Signed)
[place]. [facility]  Patient:    [name] [name], [name] [name]                    MRN: [id number] Proc. Date: 12/03/00 Adm. Date:  [id number] Disc. Date: [id number] Attending:  Barton Fanny                           Operative Report  PREOPERATIVE DIAGNOSIS:  Neuromuscular weakness, rule out diabetic amyotrophy.  POSTOPERATIVE DIAGNOSIS:  Neuromuscular weakness, rule out diabetic amyotrophy.  PROCEDURE:  Right sural nerve biopsy.  Right gastrocnemius muscle biopsy.  SURGEON:  Hewitt Shorts, M.D.  ANESTHESIA:  General endotracheal anesthesia.  INDICATIONS:  This is a 77 year old man who has developed progressive weight loss, muscular atrophy, and weakness and loss of sensation as well as bladder dysfunction with urinary retention and bowel dysfunction as well.  The decision was made to proceed with nerve and muscle biopsy at the request of his evaluating and treating neurologist, Genene Churn. Love, M.D.  DESCRIPTION OF PROCEDURE:  The patient was brought to the operating room and placed under general endotracheal anesthesia.  The patient was placed in a left side down lateral position.  The right lower extremity was prepped to the midthigh with Betadine soap and solution and draped in a sterile fashion.  We first performed the right sural biopsy.  Incision was made midway between the lateral malleolus and achilles tendon and carried proximally.  The subcutaneous tissue was dissected and we identified the right sural nerve. Approximately a 3 cm segment was obtained as a specimen and sent in saline to the pathology department for evaluation and examination.  Bipolar cautery was used to establish hemostasis.  Subcutaneous and subcuticular were closed with interrupted inverted 2-0 undyed Vicryl sutures and the skin edges were closed with Dermabond.  The wound was dressed with fluffed sponges and wrapped with a Curlex.  The right gastrocnemius muscle biopsy  was obtained by making an incision over the gastronemius muscle.  The dissection was carried down through the subcutaneous tissue.  The fascia overly the muscle was dissected free from it. We freed the muscle up from the fascial attachments.  It was clamped to a tongue blade to maintain its tension and was sent to pathology in saline, clamped to the tongue blade to maintain its anatomic position.  Bipolar cautery was used to establish hemostasis and then the muscular fascia was closed with interrupted 2-0 undyed Vicryl sutures and the subcutaneous layer was closed with interrupted inverted 2-0 undyed Vicryl sutures and the skin edges were closed with Dermabond and this wound was as well dressed with fluffed sponges and wrapped with Curlex.  The patient tolerated the procedure well and estimated blood loss was nil.  Sponge, needle, and instrument counts were correct.  Following surgery the patient was turned back to a supine position, reversed from the anesthetic to be extubated, and transferred to the recovery room for further care. DD:  12/03/00 TD:  12/03/00 Job: [id number]

## 2010-11-16 NOTE — H&P (Signed)
NAME:  [name] [name], [name] [name]             ACCOUNT NO.:  [id number]   MEDICAL RECORD NO.:  [id number]          PATIENT TYPE:  INP   LOCATION:  5501                         FACILITY:  [facility]   PHYSICIAN:  Kela Millin, M.D.DATE OF BIRTH:  07-Jul-1928   DATE OF ADMISSION:  08/05/2005  DATE OF DISCHARGE:                                HISTORY & PHYSICAL   PRIMARY CARE PHYSICIAN:  Dr. Abigail Miyamoto   CHIEF COMPLAINT:  Increasing fatigue/somnolence.  Also elevated blood  sugars.   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  The patient states that he was in his usual state of health  until about three weeks ago when he began having easy fatigability as well  as increasing somnolence and shortness of breath.  He has also had decreased  p.o. intake/anorexia.  He admits to nausea and vomiting x3 episodes in the  past two days.  He also admits to polydipsia and polyuria.  He denies  polyphagia.  The patient also denies chest pain, cough, dysuria, fevers,  diarrhea, hematemesis, melena, and no hematochezia.   The patient was seen in her primary care physician's office and a blood  glucose was done and was noted to be greater than 600 and he was directly  admitted to the [facility] hospitalists service for further evaluation and  management.   Dictation ended at this point.      Kela Millin, M.D.  Electronically Signed     ACV/MEDQ  D:  08/06/2005  T:  08/06/2005  Job:  [id number]

## 2010-11-16 NOTE — Op Note (Signed)
NAME:  [name] [name], [name] [name]             ACCOUNT NO.:  [id number]   MEDICAL RECORD NO.:  [id number]          PATIENT TYPE:  INP   LOCATION:  2301                         FACILITY:  [facility]   PHYSICIAN:  Salvatore Decent. Dorris Fetch, M.D.DATE OF BIRTH:  10/20/1928   DATE OF PROCEDURE:  02/11/2005  DATE OF DISCHARGE:                                 OPERATIVE REPORT   PREOPERATIVE DIAGNOSIS:  Unstable angina with three-vessel coronary disease.   POSTOPERATIVE DIAGNOSIS:  Unstable angina with three-vessel coronary  disease.   PROCEDURE:  Redo median sternotomy, extracorporeal circulation, coronary  artery bypass grafting x4 (left internal mammary artery to LAD, saphenous  vein graft to first diagonal, saphenous vein graft to first obtuse marginal,  saphenous vein graft to posterior descending).   SURGEON:  Salvatore Decent. Dorris Fetch, M.D.   ASSISTANT:  Pecola Leisure, PA.   ANESTHESIA:  General.   FINDINGS:  Severe intrapericardial adhesions, LAD intramyocardial, fair  quality targets, good-quality conduits, normal LV function and trace aortic  insufficiency by transesophageal echocardiography.   CLINICAL NOTE:  [name] [name] is a 77 year old gentleman who had previously  undergone replacement of his ascending aorta who now presents with unstable  angina. He had an episode of angina at rest.  At catheterization he had  severe three-vessel disease not amenable to percutaneous intervention. The  patient was referred for coronary artery bypass grafting.  The indications,  risks, benefits and alternatives were discussed in detail with the patient.  He understood the high-risk nature the procedure because of his associated  medical problems and a redo sternotomy.  He understood and accepted the  risks and agreed to proceed.   OPERATIVE NOTE:  [name] [name] was brought to the preop holding area on  February 11, 2005. There, lines were placed by anesthesia to monitor arterial,  central venous, and  pulmonary arterial pressure. Intravenous antibiotics  were administered. He was taken to the operating room, anesthetized and  intubated. Transesophageal echocardiography was performed by Dr. Laverle Hobby that revealed good left ventricular function. There was trace to 1+  aortic insufficiency. There was no significant mitral regurgitation. The  chest, abdomen and legs were then prepped and draped in usual fashion.   The median sternotomy was performed. This was done with the oscillating saw  after removing the wires. The sides of the sternum then were elevated and  the underlying attached tissues were dissected off, allowing placement of  the __________ retractor.  The left internal mammary artery then was  harvested using standard technique. The patient was treated with aprotinin.  The full heparin dose was given prior to dividing the distal end of the  mammary artery. There was excellent flow through the cut end of the vessel.  Simultaneously, incision was made in the medial aspect of the right leg and  greater saphenous vein was harvested endoscopically from the right thigh and  leg. It was a good quality vein.   A retractor then was placed and dissection was carried out in the  mediastinum. There were severe intrapericardial adhesions.  These were taken  down adequately to expose the right atrium.  The dissection then was carried  over the previous ascending aortic graft. The aortic cannula was placed at  the distal portion of the graft via pledgeted 2-0 Ethibond sutures.  A dual  stage venous cannula was placed via pursestring suture in the right atrium.  Cardiopulmonary bypass was instituted and the patient was cooled to 32  degrees Celsius.  The remainder of the intrapericardial adhesions were taken  down.  The coronary arteries were inspected and anastomotic sites were  chosen. The conduits were inspected and cut to length. A foam pad was placed  in the pericardium to protect  the left phrenic nerve. The left ventricular  vent was placed via pursestring suture in the right superior pulmonary vein.  Retrograde cardioplegia cannula placed via pursestring suture in the right  atrium and directed in the coronary sinus and a antegrade cardioplegic  cannula was placed in the ascending aortic graft. Flows were maintained per  protocol throughout the bypass. Anticoagulation was maintained accounting  for aprotinin.   The aortic graft was crossclamped.  Cardiac arrest then was achieved with  combination of cold antegrade and retrograde blood cardioplegia and topical  iced saline. After achieving complete diastolic arrest and adequate  myocardial septal cooling, the following distal anastomoses were performed.   First a reverse saphenous vein graft was placed end-to-side to the posterior  descending. This was 1.5 mm vessel tapering to 1 mm just beyond the  anastomosis.  The vein graft was of good quality. The artery was fair  quality. The anastomosis was performed with running 7-0 Prolene suture.  There  was good flow through the graft. Cardioplegia was administered. There  was good hemostasis.   Next, a reverse saphenous vein graft was placed end-to-side to the first  obtuse marginal branch of the left circumflex coronary artery. This was a  1.5 mm vessel that was grafted end-to-side with a reverse saphenous vein  graft with running 7-0 Prolene suture.  The anastomosis was probed  proximally and distally. The probe passed easily. There was good flow.  Cardioplegia was administered. There was good hemostasis.   Next, a reverse saphenous vein grafts placed end-to-side to the first  diagonal. This was a 1.5 mL vessel.  The  anastomosis was performed with a  running 7-0 Prolene suture.  Again there was good flow through the graft and  good hemostasis.  Next, the left internal mammary artery was spatulated at its distal end.  It  was then anastomosed end-to-side to mid  LAD which was an intramyocardial  vessel. There was some palpable disease distally but a 1.5 mm probe passed  easily. The anastomosis was performed with a running 8-0 Prolene suture. At  the completion the mammary to LAD anastomosis, the bulldog clamp was briefly  removed to inspect for hemostasis. Immediate rapid septal rewarming was  noted.  The bulldog clamp was replaced. The mammary pedicle was tacked to  the epicardial surface of the heart with 6-0 Prolene sutures.   Additional cardioplegia was administered.  The cardioplegia cannula was  removed from the ascending aortic graft.  Using was aortic punch and thermal  cautery, sites for the proximal anastomoses were created in the ascending  graft. The proximal anastomoses then performed with a running 6-0 Prolene  sutures.  At the completion of the final proximal anastomosis, the patient  was placed Trendelenburg position. A warm dose of cardioplegia was  administered via the retrograde cannula. The left ventricle and aortic root  were de-aired. Lidocaine was  administered and the aortic crossclamp was  removed. Total crossclamp time was 97 minutes.  The patient initially  fibrillated but converted spontaneously.   All proximal and distal anastomoses were inspected for hemostasis.  Epicardial pacing wires were placed in the right ventricle and right atrium.  DDD pacing was initiated.  When the patient had rewarmed to a core  temperature of the 37 degrees Celsius, he was weaned from cardiopulmonary  bypass, atrially paced for rate and on a low-dose dopamine infusion. The  patient weaned from bypass without difficulty. Initial cardiac index was  greater than 2 liters per minute per sq m. Transesophageal echo showed  preserved left ventricular function.  The patient remained hemodynamically  stable throughout post bypass period.   A test dose of protamine was administered and was well tolerated. The atrial  and aortic cannulae were  removed. The remainder of the protamine was  administered without incident. The chest irrigated with 1 liter of warm  normal saline containing 1 g of vancomycin. Hemostasis was achieved. A left  pleural and two mediastinal chest tubes were placed through separate  subcostal incisions. The sternum was closed with interrupted heavy gauge  stainless steel wires and came together easily without tension and no  hemodynamic compromise.  The remainder of the incision was closed in  standard fashion. All sponge, needle and instrument counts were correct at  the end of the procedure.  The patient was taken from the operating room to  the surgical intensive care unit in critical but stable condition.   The patient is a poor candidate for redo grafting.           ______________________________  Salvatore Decent. Dorris Fetch, M.D.     SCH/MEDQ  D:  02/12/2005  T:  02/12/2005  Job:  [id number]  cc:   Nicki Guadalajara, M.D.  [phone] [address]  [place], [place] [address]  Fax: [phone]   Chales Salmon. Abigail Miyamoto, M.D.  [address]  [place]  [place] [address]  Fax: [phone]

## 2010-11-16 NOTE — Op Note (Signed)
   NAME:  [name] [name], [name] [name] NO.:  [id number]   MEDICAL RECORD NO.:  [id number]                   PATIENT TYPE:   LOCATION:                                       FACILITY:   PHYSICIAN:  Danise Edge, M.D.                DATE OF BIRTH:   DATE OF PROCEDURE:  07/19/2002  DATE OF DISCHARGE:                                 OPERATIVE REPORT   PROCEDURE:  Screening colonoscopy.   INDICATIONS:  The patient is a 77 year old male born 05/01/1929.  The patient is  scheduled to undergo his first screening colonoscopy with polypectomy to  prevent colon cancer.  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   ENDOSCOPIST:  Danise Edge, M.D.   PREMEDICATION:  Versed 5 mg, Demerol 50 mg.   ENDOSCOPE:  Olympus adult colonoscope.  The pediatric colonoscope was not  rigid enough to complete a full colonoscopy.   DESCRIPTION OF PROCEDURE:  After obtaining informed consent, the patient was  placed in the left lateral decubitus position.  I administered intravenous  Demerol and intravenous Versed to achieve conscious sedation for the  procedure.  The patient's blood pressure, oxygen saturation, and cardiac  rhythm were monitored throughout the procedure and documented in the medical  record.   Anal inspection was normal.  Digital rectal exam was normal.  The Olympus  adult colonoscope was introduced into the rectum and with some difficulty  due to colonic loop formation eventually advanced to the cecum.  Colonic  preparation for the exam today was fair at best.  Small colon polyps could  easily have been missed during this exam.   Rectum:  From the mid-rectum two 0.5 mm sessile polyps were removed with the  cold biopsy forceps.   Sigmoid colon and descending colon normal.   Splenic flexure normal.   Transverse colon normal.   Hepatic flexure normal.   Ascending colon  normal.   Cecum and ileocecal valve normal.    ASSESSMENT:  Two diminutive polyps were removed from the rectum with the  cold biopsy forceps; otherwise normal proctocolonoscopy to the cecum.  Accuracy of this colonoscopy was limited due to colonic loop formation.   RECOMMENDATIONS:  Repeat optical colonoscopy or virtual colonoscopy in five  years.                                               Danise Edge, M.D.    MJ/MEDQ  D:  07/19/2002  T:  07/19/2002  Job:  [id number]   cc:   Chales Salmon. Abigail Miyamoto, M.D.  [address]  [place]  [place] [address]  Fax: [phone]

## 2010-11-16 NOTE — Discharge Summary (Signed)
NAME:  [name] [name], [name] [name] NO.:  [id number]   MEDICAL RECORD NO.:  [id number]          PATIENT TYPE:  INP   LOCATION:  2024                         FACILITY:  [facility]   PHYSICIAN:  Salvatore Decent. Dorris Fetch, M.D.DATE OF BIRTH:  [birth year]   DATE OF ADMISSION:  02/02/2005  DATE OF DISCHARGE:                                 DISCHARGE SUMMARY   ADMISSION DIAGNOSIS:  Chest pain.   DISCHARGE DIAGNOSIS:  1.  Severe three vessel coronary artery disease status post endocardial      myocardial infarction.  2.  Class I angina.  3.  Type 2 diabetes mellitus.  4.  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  5.  Status post median sternotomy with resection and grafting of ruptured      ascending aortic aneurysm in March 2004 by Dr. Dorris Fetch.  6.  History of atrial fibrillation.  7.  Hyperlipidemia.  8.  Postoperative bronchitis.   PROCEDURE PERFORMED:  1.  Cardiac catheterization.  2.  Coronary artery bypass grafting x 4 (left internal mammary artery to the      left anterior descending, saphenous vein graft to the posterior      descending, saphenous vein graft to the first obtuse marginal, saphenous      vein graft to the first diagonal).  3.  Endoscopic vein harvest right leg.   HISTORY:  The patient is a 77 year old white male who is status post  previous median sternotomy with emergency resection and grafting of a  ruptured ascending thoracic aortic aneurysm performed by Dr. Dorris Fetch in  March 2004.  He had a slow recovery but has been doing well from a cardiac  standpoint since that time.  However, on the night prior to this admission,  he developed squeezing substernal chest pain of acute onset which occurred  at rest and was recurrent throughout the night.  He was ultimately brought  to the emergency department at [facility] where an EKG showed no  acute abnormalities but cardiac enzymes were  positive.  He had a troponin of  1.7 and CPK MB of 10.9.  Because of these findings, he was admitted by  [facility] for further evaluation and treatment.   HOSPITAL COURSE:  The patient was started on IV nitroglycerin and heparin  and followed with cardiac serial enzymes which were mildly positive.  He  remained stable and pain free and ultimately underwent cardiac  catheterization on February 04, 2005.  This showed severe three vessel disease  with 95% stenosis of the LAD, 99% of the distal right coronary artery, 70%  stenosis of the circumflex, with an ejection fraction of 60% and a left  ventricular end diastolic pressure 14 mmHg.  Because of his multi-vessel  disease, he was not felt to be a good candidate for percutaneous  intervention.  Cardiothoracic surgery consultation was obtained.  Initially,  the patient was seen by Dr. Kathlee Nations Trigt and films were reviewed.  Dr.  Donata Clay agreed that his best course of action would be  to proceed with  surgical revascularization.  He discussed the risks, benefits, and  alternatives of the procedure with the patient and the patient agreed to  proceed.  During the course of his preoperative workup, he underwent vein  mapping Doppler studies of the lower extremities and MRA of the thoracic  aorta.  This showed no significant aneurysmal disease and there was a  moderate area of stenosis in the right iliac artery.  An echocardiogram was  performed which showed good left ventricular function with 1-2+ aortic  insufficiency and no mitral regurgitation.  PFTs showed an FEV1 of 2.3 and  FVC of 3.8.  Blood gas showed pO2 71 and a pCO2 32.  It was agreed that he  should proceed with a redo sternotomy and coronary artery bypass grafting.  Since the patient had previously been operated on by Dr. Dorris Fetch, Dr.  Dorris Fetch was consulted for consideration of performing surgery.  He  reviewed the patient's films and agreed with Dr. Zenaida Niece  Trigt's previous  assessment.  He discussed the surgery with the patient and the patient did  agree to proceed.  The patient was taken to the operating room on February 11, 2005, where he underwent CABG x 4 as described in detail above performed by  Dr. Dorris Fetch.  He tolerated the procedure well and was transferred to the  SICU in stable condition.   Postoperatively, he has done very well.  He was able to be extubated shortly  after surgery.  He was hemodynamically stable and doing well on postop day  one.  At that time, his hemodynamic monitoring devices and chest tubes were  removed and he was slowly mobilized.  He was started on diuresis for mild  volume overload.  By postop day two, he was ready for transfer out to the  floor.  He has done very well postoperatively.  He has been restarted on his  Coumadin and was maintaining normal sinus rhythm with rates in the 80s.  He  has been mobilized with physical therapy and with cardiac rehab phase 1 and  is making slow but steady progress.  He is somewhat limited due to his  previous history of spinal cord injury and previous surgery.  He was  initially maintained on Lantus for coverage of his blood sugars.  As his  p.o. intake has improved, he has been restarted on his home medication  regimen and Lantus has been weaned and will be discontinued by the time of  discharge.  He has developed a cough with purulent sputum but has remained  afebrile.  He was started empirically on Zithromax to cover for mild  bronchitis.  He has continued to diurese well.  He has remained afebrile and  all vital signs have been stable.  He has been weaned from supplemental  oxygen and is maintaining O2 saturations of greater than 90% on room air.  His most recent labs showed sodium 140, potassium 4.4, BUN 23, creatinine  1.5, PT 16.7, INR 1.3, hemoglobin 8.9, hematocrit 25.9, white count 8.8, platelets 129.  The surgical incision sites were healing well.  He  is  tolerating a regular diet without difficulty.  He has a Foley catheter in  place at present but it is anticipated over the next 24-48 hours, this will  be discontinued.  The patient does in and out cath himself at home 2-3 times  per day and has been followed by urologist for urinary retention issues.  He  is still  deconditioned secondary to prior spinal cord problems and his  recent surgery and, at this time, it is felt that he will either need a  brief stay in the inpatient rehab unit subacute care unit or, if he  improves, he may be able to be discharged home with Home Health physical  therapy to follow.  He is presently doing well and is deemed medically ready  once a decision is made regarding bed availability on the rehab unit.   DISCHARGE MEDICATIONS:  1.  Coumadin, home dose will be determined by PT and INR drawn on the day of      discharge.  2.  Enteric coated aspirin 325 mg daily.  3.  Toprol 50 mg daily.  4.  Lipitor 20 mg daily.  5.  Lasix 40 mg daily x 1 week.  6.  K-Dur 20 mEq daily x 1 week.  7.  Glucophage 500 mg b.i.d.  8.  Lotensin 40 mg daily.  9.  Tylox 1-2 q.4h. p.r.n. pain.  10. Glucotrol 10 mg daily.   DISCHARGE INSTRUCTIONS:  He is asked to refrain from driving, heavy lifting,  or strenuous activities.  He may continue ambulating daily and will work  with physical therapy to continue reconditioning.  He will continue a low  fat, low sodium diet, with diabetic restrictions.  He may shower daily and  clean his incisions with soap and water.  He will make an appointment to see  Dr. Tresa Endo in two weeks.  He will need to have a PT and INR drawn 48 hours  from the time of discharge at the [facility] office for  monitoring his Coumadin.  He will see Dr.  Dorris Fetch back in the office at 11:30 a.m.  He will have a chest x-ray at  [facility] one hour prior to this appointment and should bring  his films for Dr. Dorris Fetch to review.   In the interim if he experiences  problems or has questions, he is asked to contact our office immediately.      Coral Ceo, P.A.    ______________________________  Salvatore Decent Dorris Fetch, M.D.    GC/MEDQ  D:  02/15/2005  T:  02/15/2005  Job:  [id number]   cc:   Nicki Guadalajara, M.D.  [address], [address]  [place], [place] [address]  Fax: [phone]   Chales Salmon. Abigail Miyamoto, M.D.  [address]  [place]  [place] [address]  Fax: [phone]

## 2010-11-16 NOTE — H&P (Signed)
[place]. [facility]  Patient:    [name] [name] [name], [name] [name]                    MRN: [id number] Adm. Date:  [id number] Attending:  Erich Montane CC:         Hewitt Shorts, M.D.  Pollyann Savoy, M.D.   History and Physical  PATIENT ADDRESS: [address] [place], [place] [address]  DATE OF BIRTH: [birth year]  CHIEF COMPLAINT: This is one of several [place]. 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  HISTORY OF PRESENT ILLNESS: [name] [name] [name] [name] [name] has a known prior history of diabetes mellitus and underwent a right laminectomy in March 1986 for right lumbar radiculopathy by Dr. Jennette Kettle at [facility]. [facility] in [place], [place]. He subsequently did well until June 2001 when he noted onset of sciatic distribution pain occurring in the back of his legs, relieved by taking Advil. This would occur while playing golf.  In October 2001 he noted that he could not walk very far and in February 2002 underwent bilateral L4-5 decompression and PLIF with synthesis, PLIF spacers, and bilateral posterolateral arthrodesis with posterior instrumentation and right iliac crest bone graft procedure by Dr. Shirlean Kelly.  At that time he noted almost immediate relief of pain occurring in his legs.  Preoperatively the pain would begin in his buttocks, radiate down his right leg more so than his leg, below-the-knee to the calf region.  He noted no significant numbness or bowel or bladder dysfunction prior to surgery.  In mid April 2002 he noted onset of numbness occurring in his buttock region radiating to his groin and down both hips, greater on the right than the left, which was not associated with pain.  He had not had any bowel dysfunction but then developed bladder incontinence requiring  self-catheterization in April 2002.  He developed progressive right foot drop, began using a rolling walker, and developed slowly progressive hemiparesis, which has been evaluated with multiple studies.  An MRI study of the lumbar spine postoperatively without and with contrast enhancement showed no evidence of cord ischemia and some mild stenosis at the L3 level.  Nerve conduction and EMG studies on Nov 02, 2000 showed generalized sensory and motor peripheral neuropathy, primarily of the axonal type, which would not be consistent with demyelinating neuropathy of CIDP or Guillain-Barre.  He also had evidence of denervation to L5 enervated muscles and the S1 enervated muscles of the right lower extremity, with chronic denervation of the S1 nerve on the right and some left S1 acute denervation.  The question was diabetic radiculitis versus healing L5-S1 radiculopathies.  He had had a right foot drop and required a rolling walker.  It was decided that he may have a form of neuropathy but a total lumbar and cervical myelogram was performed on Nov 24, 2000 showing wide patency of the fusion level at L4-5, moderate stenosis at L3-4 and L5-S1 due to diffusely protruding disk, and some facet and ligamentous hypertrophy.  The thoracic myelogram showed no evidence of stenosis in the compressive region.  There were noted to be prominent vascular structures in the lower thoracic region, which was probably considered to be normal, but he would not rule out the possibility of an AVM.  Post myelogram CT scan was thought to show the vascular structural sources of the  cord to be within normal limits.  The cervical region showed that the cervical canal was widely patent throughout the cervical spinal cord region with mid to lower cervical spondylosis.  A CT scan of the chest on December 03, 2000 showed a 2 x 1.2 cm right upper lobe nodule, which was found to be benign by a biopsy under radiographic guidance.  At  the time of his myelogram he underwent CSF evaluation, showing a total protein of 142, a glucose of 43, 13 red blood cells, 13 white blood cells, IgG and CSF of 15.2, a slightly elevated ______ index of 0.68, and negative oligoclonal banding present.  Other studies for work-up of the neuropathy including heavy metals, sedimentation rate, HIV, ANA, serum protein electrophoresis, with some mild elevation of alpha 1 and alpha 2 which was nonspecific, negative angiotensin converting enzymes study, and immunofixation electrophoresis without evidence of monoclonal protein present.  A right sural and skeletal nerve biopsy on December 03, 2000 showed chronic neurogenic atrophy with middle lobe chronic neuropathy and mild axonal degeneration, with regeneration only slight mild demyelinization with remyelinization present.  Amyloid stains were negative but Dr. Alonza Bogus at [facility] did not feel he could entirely rule out that possibility of an amyloid neuropathy.  The patient was treated with a five day course of 400 mcg/kg of IVIG beginning December 02, 2000 through December 26, 2000 without any improvement and possibly some worsening.  He has noted increased weakness in his left leg, obstipation and bowel incontinence requiring catheterization five times per day.  He has noted increasing problems with fatigue.  An MRI study of the thoracic spine with and without contrast enhancement on January 12, 2001 showed an abnormal appearance of cord with altered signal intensity and prominence in size extending from C7 to the conus with prominent surrounding vessels along the dorsal aspect of the cord suggestive of a dural vascular malformation.  Because of this he is admitted for consideration of myelography.  PAST MEDICAL HISTORY:  1. Hypertension.  2. Elevated cholesterol with dyslipidemia.  3. Diabetes mellitus.  ADMISSION MEDICATIONS:  1. Glucophage 500 mg b.i.d.  2. Glucotrol 10 mg q.d.  3.  Vasotec 10 mg q.d.  4. Aspirin one q.d.   SOCIAL HISTORY: He does smoke cigarettes, half a pack per day, and he drinks four ounces of red wine per day.  He is married.  Denies any home problems. Has one child, a son age 48, living and well.  OCCUPATIONAL HISTORY: [employer].  Retired from Psychologist, clinical in 1989.  FAMILY HISTORY: His mother died at age 34.  His father died at age 52.  He has a brother, 52, living and well.  PAST SURGICAL HISTORY:  1. History of left foot surgery in 1937 for trauma injury to his left foot,     causing deformity and atrophy of his left foot and left lower leg since     that time.  2. History of right lumbar laminectomy in March 1986.  3. Bilateral L4-5 decompressive laminectomy and stabilization in February     2002.  PHYSICAL EXAMINATION:  GENERAL: Well-developed thin white male.  VITAL SIGNS: Blood pressure in right and left arm 150/80.  Heart rate 84 and regular.  There were no bruits.  NEUROLOGIC: Mental status, he was alert and oriented x 3.  Cranial nerve examination revealed visual fields to be full, discs flat, spontaneous venous pulsation seen.  There was a left ptosis present.  Corneals were present. Facial sensation  was equal.  There was no facial motor asymmetry.  His motor examination revealed good strength in the 5/5 range in the upper extremities but in the lower extremities these demonstrated atrophy without fasciculations.  In the left leg EHL was 3, anterior tibialis 4, peronei 3, posterior tibialis 3, hamstrings 4, iliopsoas 2, quadriceps 4, gluteus medius 4, gluteus maximus 4; in the right leg EHL 0, anterior tibialis 0, peronei 0, posterior tibialis 0, hamstrings 3, iliopsoas 2, quadriceps 3, gluteus medius and gluteus maximus 3.  He had decreased pinprick to his knees, with some decreased pinprick around the sciatic nerve distribution on the right and left and also around the anus.  He had decreased vibration sense to his  knees.  He had absent joint position of his lower extremities.  He had decreased rectal tone on examination.  Plantar responses were downgoing on the right and hard to evaluate on the left because of his foot deformity.  HEENT: Tympanic membranes clear.  NECK: No enlargement of thyroid.  CHEST: Lungs clear.  HEART: No murmurs.  ABDOMEN: Bowel sounds normal.  IMPRESSION:  1. Thoracic myelopathy.  Rule out arteriovenous malformation of the cord.     Code 721.41.  2. Diabetes mellitus, code 250.60.  3. Hypertension, code 796.2.  4. Pulmonary lesion, currently being followed as benign.  PLAN: The plan at this time is to admit the patient for angiography of the spinal cord. DD:  01/13/01 TD:  01/14/01 Job: [id number]

## 2010-11-16 NOTE — Discharge Summary (Signed)
[place]. [facility]  Patient:    [name] [name], [name] [name]                    MRN: [id number] Adm. Date:  [id number] Disc. Date: [id number] Attending:  Erich Montane                           Discharge Summary  ADDENDUM:  Blood studies on the day of discharge showed a BUN of 18, creatinine 0.8, calcium 9.1, sodium 140, potassium 3.7, chloride 108, CO2 content 27, and a calcium of 9.1.  The glucose was 63 at the time of discharge. DD:  01/15/01 TD:  01/18/01 Job: [id number]

## 2010-11-16 NOTE — H&P (Signed)
[place]. [facility]  Patient:    [name] [name], [name] [name]                    MRN: [id number] Adm. Date:  [id number] Attending:  Barton Fanny                         History and Physical  HISTORY OF PRESENT ILLNESS:  Patient is a 77 year old right-handed white male whom I evaluated for degenerative changes to the low back associated with low back pain and bilateral lower extremity pain.  His symptoms began in June 2001 and persisted despite extensive nonsteroidal treatment including a variety of medications, physical therapy, and exercises.  Patient has been evaluated with MRI scan and myelogram and post myelogram CT scan as well as x-ray studies.  Flexion-extension x-rays show that he has a dynamic degenerative spondylolithesis at the L4-5 level.  There is significant spinal stenosis at the L4-5 level, right worse than left due to ligament thickening but also to significant disk herniation, particularly to the right at L4-5.  The patient notably has a history of previous lumbar surgery and his studies appear to show that having been done at the L5-S1 level; however, there does not appear to be any significant sacral ______ nerve root compression.  We discussed options for further nonsurgical intervention versus possible surgical intervention and have discussed a variety of surgical procedures.  He has elected to proceed with surgery and is admitted for such and he is admitted for a bilateral L4-5 lumbar laminotomy and microdiskectomy and a posterior lumbar interbody fusion with Synthes Plus spacers and iliac crest bone graft and a posterolateral arthrodesis with posterior instrumentation and iliac crest bone graft.  PAST MEDICAL HISTORY:  Notable for history of hypertension treated for the past five years, history of diabetes mellitus treated for the past four years. There is no history of myocardial infarction, cancer, stroke, peptic ulcer disease,  or lung disease.  PREVIOUS SURGERY:  Includes the lumbar spine surgery at [facility]. [facility] in [place] in March 1986.  He required reoperation a month later and had left foot surgery in 1937 at age 49 for a traumatic injury which has left him with a deformed left foot and atrophy involving the foot and left leg.  ALLERGIES:  He denies allergies to medications.  CURRENT MEDICATIONS: 1. Glucophage 500 mg b.i.d. 2. Glucotrol XL 10 mg q.d. 3. Lipitor 10 mg q.d. 4. Enalapril 10 mg q.d. 5. Diovan 80 mg q.d.  FAMILY HISTORY:  His mother died at age 61 of old age.  His father died at age 14.  He had cirrhosis due to alcoholism.  SOCIAL HISTORY:  Patient is retired, he is married.  He smokes about a half a pack a day.  He did smoke more heavily in the past.  He has smoked for over 50 years.  He drinks alcoholic beverages socially.  He denies a history of substance abuse.  REVIEW OF SYSTEMS:  Notable for those difficulties described in his history of present illness and past medical history but is otherwise unremarkable.  PHYSICAL EXAMINATION:  GENERAL:  Patient is a well-developed, well-nourished white male in no acute distress.  VITAL SIGNS:  Temperature 97.5, pulse 76, blood pressure 140/82, respiratory rate 16, height 6 feet, weight 180 pounds.  LUNGS:  Clear to auscultation.  He has symmetric respiratory excursion.  HEART:  Regular rate and rhythm, normal S1, S2.  There is a 2/6 systolic murmur.  ABDOMEN:  Soft, nondistended, bowel sounds are present.  EXTREMITIES:  Show a deformity and atrophy of the left leg and foot which is clearly a chronic condition.  There is no cyanosis or edema.  MUSCULOSKELETAL:  Shows no tenderness to palpation over the lumbar spinous process or paralumbar musculature.  He is able to flex 90 degrees, able to extend well.  Straight leg raising is negative bilaterally.  NEUROLOGIC:  Shows 5/5 strength in the plantar flexor and  dorsiflexor bilateral, in the left extensor hallucis longus 4+ to 5/5 and the right extensor hallucis longus is 4 to 4+/5.  Sensory examination shows diminished sensation along the lateral aspect of his left foot which he feels is due to his old traumatic injury.  Reflexes are 2 at the quadriceps, minimal in the gastrocnemii, they are symmetrical.  The left toes are withdrawal response, the right toe is clearly downgoing.  He has a normal gait and stance.  IMPRESSION:  Low back pain and bilateral lumbar radiculopathy secondary to lumbar stenosis and a superimposed right L4-5 disk herniation.  He also has dynamic degenerative spondylolithesis at the L4-5 level.  PLAN:  The patient to be admitted for a bilateral L4-5 lumbar laminotomy, microdiskectomy, and posterior lumbar interbody fusion with Synthes Plus spacers and iliac crest bone graft and a posterolateral arthrodesis with posterior instrumentation and iliac crest bone graft.  Patient understands the alternatives to surgery.  We discussed the nature of the surgical procedure itself including the risks of surgery, including the risks of infection which are increased due to his diabetes, as well as risks of bleeding; possible need for transfusion; the risk of nerve root dysfunction with pain, weakness, numbness, or paresthesias; the risk of dural tear and CSF leak and the possible need for further surgery; and anesthetic risks of myocardial infarction, stroke, pneumonia, and death.  He understands that we will immobilize him postoperatively in a lumbar corset and that we will use a Cell Saver during surgery.  Understanding all of this, he does wish to proceed with surgery, and is admitted for such.DD:  08/07/00 TD:  08/07/00 Job: [id number]

## 2010-11-16 NOTE — Op Note (Signed)
[place]. [facility]  Patient:    [name] [name], [name] [name]                    MRN: [id number] Proc. Date: 08/07/00 Adm. Date:  [id number] Attending:  Barton Fanny                           Operative Report  PREOPERATIVE DIAGNOSES:  L4-5 lumbar disk herniation, lumbar stenosis, lumbar spondylosis, lumbar degenerative disk disease, and degenerative spondylolisthesis with bilateral lumbar radiculopathy.  POSTOPERATIVE DIAGNOSES:  L4-5 lumbar disk herniation, lumbar stenosis, lumbar spondylosis, lumbar degenerative disk disease, and degenerative spondylolisthesis with bilateral lumbar radiculopathy.  PROCEDURES:  Bilateral L4-5 lumbar laminotomy, microdiskectomy, and posterior lumbar interbody fusion with Synthes PLIF spacers (13 mm) and right iliac crest bone graft and bilateral L4-5 posterolateral arthrodesis with [employer] _____ posterior instrumentation and right iliac crest bone graft.  SURGEON:  Hewitt Shorts, M.D.  ASSISTANT:  Cristi Loron, M.D.  ANESTHESIA:  General endotracheal.  INDICATIONS:  The patient is a 77 year old man who presented with bilateral lumbar radiculopathy, who was found by MRI scan, myelogram, and postmyelogram CT scan to have dynamic instability at the L4-5 level on flexion and extension with superimposed degenerative disk disease and spondylosis and a degenerative spondylolisthesis with superimposed disk herniation, worse to the right than to the left, with resultant bilateral radiculopathy.  A decision was made to proceed with elective decompression and arthrodesis.  DESCRIPTION OF PROCEDURE:  Patient brought to the operating room and placed under general endotracheal anesthesia.  The patient was turned to a prone position.  The lumbar region was prepped with Betadine soap and solution and draped in a sterile fashion.  The midline incision was infiltrated with local anesthetic with epinephrine, and a  localizing x-ray was taken and the L4-5 level identified.  A midline incision was made, carried down through the subcutaneous tissue with bipolar cautery and electrocautery used to maintain hemostasis.  Dissection was carried down to the lumbar fascia, which was incised bilaterally, and the paraspinous muscle was dissected from the spinous processes and laminae in a subperiosteal fashion.  An additional localizing x-ray was taken and the L4-5 level identified.  Bilateral L4-5 laminotomy was performed using the [facility] Max drill with the Turk L8 bit and double-action rongeurs, curettes, and Kerrison punches.  The ligamentum flavum was removed, and the underlying thecal sac was identified bilaterally, as was the L5 nerve roots identified bilaterally.  The microscope was draped and brought into the field to provide additional magnification, illumination, and visualization, and we proceeded with a microdiskectomy using microdissection.  Diskectomy was begun on the right side with incision of the annulus.  A large amount of subligamentous disk herniation was removed.  The disk itself was quite degenerated, and we continued to remove extensive amounts of degenerated disk material and removed the cartilaginous end plates of the L4 and L5 vertebrae. We then approached the disk from the left side, again incising the annulus and using a variety of pituitary rongeurs to perform a thorough diskectomy both medially and laterally.  In the end, good decompression of the thecal sac and nerve roots was achieved bilaterally, and all these fragments of disk material were removed from both the disk space and the epidural space, and again the cartilaginous end plates were removed on the left side as well.  We then proceeded with the L4-5 interbody arthrodesis.  Using a box curette,  the cartilaginous end plates and the vertebral body surfaces were further prepared, and they were curetted down to bony surface.  We  then used an intervertebral disk space spreader and sized a 13 mm graft.  We selected two 13 mm PLIF spacers.  These were soaked in saline and then positioned in the intervertebral disk space.  Once both grafts were in position, an x-ray was taken and the grafts were noted to be in good position.  We then opened the plane above the level of the fascia, dissecting it to the right iliac crest. The fascia overlying the iliac crest was incised, and the muscle attached to the iliac crest was dissected from it.  We then placed a Taylor retractor and using a variety of osteotomes and then gouges and curettes, extensive amounts of autograft were harvested.  We harvested both corticocancellous strips as well as extensive amounts of cancellous bone.  This was then set aside.  The fascia overlying the donor site was closed with interrupted undyed 1 Vicryl sutures.  We then replaced the self-retaining retractor in the lumbar incision, an cancellous bone was packed in the intervertebral disk space between the PLIF spacers and lateral to each of them.  We then proceeded with the posterolateral arthrodesis.  The transverse processes of L4 and L5 were identified and cleaned of soft tissue and decorticated.  We then identified pedicle entry sites to the L4 and L5 pedicles bilaterally and using C-arm fluoroscopy, a pair of pedicle screws was placed at each level.  Each of the screw holes was initially probed, then tapped with a 5.25 mm tap.  We selected 15 mm _____ screws and placed 5.75 screws at the L4 level and 6.75 screws at the L5 level.  Each of the screw holes after being tapped was checked for cut-out with a ball dissector, and none were found.  All four screws were then placed.  We then selected a 40 mm rod, which was positioned within the screw heads bilaterally, and then locking caps were placed and tightened in a sequential fashion with a counter-torque.  We then packed cancellous bone and then  corticocancellous bone over the decorticated transverse processes of L4 and L5 as well as in the intertransverse space between them.  This was done bilaterally.  The wound was irrigated with bacitracin solution throughout the  procedure, and good hemostasis was established prior to closure.  Once hemostasis was established, we proceeded with closure.  The deep fascia was closed with interrupted undyed 1 Vicryl sutures, the subcutaneous and subcuticular closure with interrupted 2-0 undyed Vicryl sutures, and the skin was closed with dermabond.  The patient tolerated the procedure well.  The estimated blood loss was 600 cc.  We did use a Cell Saver during the case and were able to return 200 cc of Cell Saver blood to the patient.  Sponge and needle count were correct.  Following the surgery, the patient was turned back to the supine position, to be reversed from anesthetic, extubated, and transferred to the recovery room for further care. DD:  08/07/00 TD:  08/08/00 Job: [id number]

## 2010-11-16 NOTE — Discharge Summary (Signed)
[place]. [facility]  Patient:    JOHNSON, ARIZOLA                    MRN: [id number] Adm. Date:  [id number] Disc. Date: 08/10/00 Attending:  Barton Fanny                           Discharge Summary  ADMISSION DIAGNOSIS:  Lumbar spondylosis at L4-L5.  DISCHARGE DIAGNOSIS:  Lumbar spondylosis at L4-L5.  OPERATIONS PERFORMED:  L4-L5 laminectomy, diskectomy, and posterior lumbar interbody fusion with Ray threaded fusion cage.  COMPLICATIONS:  None.  ATTENDING PHYSICIAN:  Dr. Newell Coral.  HISTORY OF PRESENT ILLNESS:  This is a 77 year old right-handed white gentleman whose History and Physical is recounted in the chart.  He has spondylolisthesis at L4-L5 and degenerative disease and also disk at L4-L5.  PAST MEDICAL HISTORY:  Remarkable for hypertension, diabetes.  ALLERGIES:  None.  PHYSICAL EXAMINATION AT ADMISSION:  GENERAL:  General exam was intact.  NEUROLOGIC:  Exam demonstrated bilateral L5 radiculopathies.  HOSPITAL COURSE:  He was admitted after ascertainment of normal laboratory values and underwent a L4-L5 fusion with PLIF spacers and PLIF lateral arthrodesis with pedicle screws.  Postoperatively, he has done reasonably well.  He was up on February 8 walking around.  Foley was removed.  PCA was stopped as well.  On February 9, he was a bit slow to mobilize, had a visit with the physical and occupational therapist.  He is up and about and competent with his ADLs.  He is being discharged home in the care of his family.  He has a small amount of Vicodin at home.  His strength is full, his incision is dry.  He still has some back and leg pain which I told him would take some time to resolve.  DISCHARGE FOLLOWUP:  His follow-up will be in the [facility] office in about a week. DD:  08/10/00 TD:  08/11/00 Job: [id number]

## 2010-11-16 NOTE — H&P (Signed)
NAME:  [name] [name], [name] [name]             ACCOUNT NO.:  [id number]   MEDICAL RECORD NO.:  [id number]          PATIENT TYPE:  ORB   LOCATION:  4526                         FACILITY:  [facility]   PHYSICIAN:  Ranelle Oyster, M.D.DATE OF BIRTH:  11-08-1928   DATE OF ADMISSION:  02/20/2005  DATE OF DISCHARGE:                                HISTORY & PHYSICAL   CHIEF COMPLAINT:  Weakness.   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  The patient had been  functioning at a walker/wheelchair level prior to admission on February 12, 2005, where he began to have substernal chest pain.  A workup revealed  severe three-vessel coronary artery disease, and the patient ultimately  underwent a coronary artery bypass graft surgery x4 on February 11, 2005, by  Dr. Viviann Spare C. Hendrickson.  The patient's creatinine increased  postoperatively and diuretics as well as an ACE inhibitor were stopped.  The  creatinine has been trending back down once again.  The patient was placed  on Coumadin for anticoagulation.  He had a Foley removed yesterday and his  prior catheterization regimen was resumed.  The patient has been up with  therapy and supervision for bed mobility, minimal assist for transfers and  walking 110 feet using a rolling walker.   REVIEW OF SYSTEMS:  The patient reports a cough, occasional palpitations,  weakness, decreased sensation in the lower extremities.  Also has  constipation and urine retention.   PAST MEDICAL HISTORY:  1.  Positive for above.  2.  Diabetes.  3.  Hypertension.  4.  Neurogenic bladder.  5.  Tobacco use, plus or minus alcohol use.   FAMILY HISTORY:  Noncontributory.   SOCIAL HISTORY:  The patient is with his wife in a two-level home with a  bathroom down stairs and three steps to enter.  His wife can assist him as  needed.  He is sedentary using a walker and wheelchair prior to  admission.   MEDICATIONS:  1.  Glucophage 500 mg b.i.d.  2.  Glucotrol 10 mg daily.  3.  Coumadin 5 mg daily.  4.  Toprol 100 mg daily.  5.  Hydrochlorothiazide 12.5 mg daily.  6.  Norvasc 10 mg daily.  7.  Benazepril 40 mg daily.   LABORATORY DATA:  Hemoglobin 9.4, white count 10.8, platelets 426,000.  Sodium 138, potassium 4.7, BUN 33, creatinine 1.9.   PHYSICAL EXAMINATION:  VITAL SIGNS:  Blood pressure 121/55, pulse 87,  respirations 20, temperature 98.3 degrees.  GENERAL:  The patient is pleasant and in no acute distress.  HEENT:  Pupils equal, round, reactive to light and accommodation.  Extraocular eye movements are intact.  Ears/Nose/Throat:  Exam is  unremarkable.  NECK:  Supple without jugular venous distention or lymphadenopathy.  CHEST:  Clear to auscultation bilaterally.  Sternal chest wound is seen, is  appropriately tender.  It is well-approximated without drainage.  HEART:  A regular rate and rhythm without murmurs, rubs or gallops.  ABDOMEN:  Soft, nontender.  Bowel sounds positive.  EXTREMITIES:  No clubbing, cyanosis or edema.  NEUROLOGIC:  The patient has decreased sensation below the belly button 1/2.  Tone is average to decreased.  He has a full range of motion in all the  lower extremity joints.  Muscle function is 3+/5 in the left lower  extremity, 2 to 2+/5 proximal right lower extremity and distal right lower  extremity is 0-1/5.  He has two or three hammertoes on the left foot which  are chronic.  Cognitively the patient is appropriate.  Cranial nerve exam is  intact.   ASSESSMENT/PLAN:  1.  Functional deficit, secondary to coronary artery disease and a coronary      artery bypass graft surgery, in the setting of a prior thoracic spinal      cord injury:  Begin subacute level of rehabilitation with goal of      modified independent level using a walker or wheelchair.  The wife will      be able to assist him.  The estimated length of stay is seven  plus days.  2.  Pain management with oxycodone IR p.r.n.  3.  Deep venous thrombosis/cardiac prophylaxis with chronic Coumadin.  INR      is 1.8 today.  4.  Hypertension:  Continue Toprol.  5.  Renal insufficiency:  Monitor creatinine off of Lasix and Lotensin.  6.  Non-insulin-requiring diabetes:  Continue Glucotrol 10 mg daily.      Monitor CBG's and cover with sliding scale insulin as needed.  7.  Chronic urinary tract infection:  The patient needs sterile intermittent      catheterization q.6h. p.r.n.  Will monitor the patient's symptoms      closely.  Check a urinalysis and C&S as needed.  The patient sees Dr.      Windy Fast L. Earlene Plater as an outpatient.      Ranelle Oyster, M.D.  Electronically Signed     ZTS/MEDQ  D:  02/20/2005  T:  02/20/2005  Job:  [id number]

## 2011-12-04 HISTORY — PX: TRANSTHORACIC ECHOCARDIOGRAM: SHX275

## 2012-06-08 ENCOUNTER — Ambulatory Visit (HOSPITAL_COMMUNITY)
Admission: RE | Admit: 2012-06-08 | Discharge: 2012-06-08 | Disposition: A | Discharge status: Home / Self Care | Payer: Medicare Other | Source: Ambulatory Visit | Attending: General Surgery | Admitting: General Surgery

## 2012-06-08 ENCOUNTER — Encounter (HOSPITAL_BASED_OUTPATIENT_CLINIC_OR_DEPARTMENT_OTHER): Payer: Medicare Other | Attending: General Surgery

## 2012-06-08 ENCOUNTER — Other Ambulatory Visit (HOSPITAL_BASED_OUTPATIENT_CLINIC_OR_DEPARTMENT_OTHER): Payer: Self-pay | Admitting: General Surgery

## 2012-06-08 DIAGNOSIS — M869 Osteomyelitis, unspecified: Secondary | ICD-10-CM

## 2012-06-08 DIAGNOSIS — Q6689 Other  specified congenital deformities of feet: Secondary | ICD-10-CM | POA: Insufficient documentation

## 2012-06-08 DIAGNOSIS — L97409 Non-pressure chronic ulcer of unspecified heel and midfoot with unspecified severity: Secondary | ICD-10-CM | POA: Insufficient documentation

## 2012-06-08 DIAGNOSIS — L84 Corns and callosities: Secondary | ICD-10-CM | POA: Insufficient documentation

## 2012-06-08 DIAGNOSIS — E1169 Type 2 diabetes mellitus with other specified complication: Secondary | ICD-10-CM | POA: Insufficient documentation

## 2012-06-08 DIAGNOSIS — G0490 Encephalitis and encephalomyelitis, unspecified: Secondary | ICD-10-CM | POA: Insufficient documentation

## 2012-06-12 ENCOUNTER — Other Ambulatory Visit (HOSPITAL_COMMUNITY): Payer: Self-pay | Admitting: General Surgery

## 2012-06-12 DIAGNOSIS — T148XXA Other injury of unspecified body region, initial encounter: Secondary | ICD-10-CM

## 2012-06-12 DIAGNOSIS — I739 Peripheral vascular disease, unspecified: Secondary | ICD-10-CM

## 2012-06-25 ENCOUNTER — Encounter (HOSPITAL_COMMUNITY): Payer: PRIVATE HEALTH INSURANCE

## 2012-07-06 ENCOUNTER — Encounter (HOSPITAL_BASED_OUTPATIENT_CLINIC_OR_DEPARTMENT_OTHER): Payer: Medicare Other | Attending: General Surgery

## 2012-07-06 DIAGNOSIS — L97409 Non-pressure chronic ulcer of unspecified heel and midfoot with unspecified severity: Secondary | ICD-10-CM | POA: Insufficient documentation

## 2012-07-06 DIAGNOSIS — E1169 Type 2 diabetes mellitus with other specified complication: Secondary | ICD-10-CM | POA: Insufficient documentation

## 2012-07-13 ENCOUNTER — Encounter (HOSPITAL_BASED_OUTPATIENT_CLINIC_OR_DEPARTMENT_OTHER): Payer: Medicare Other

## 2012-07-15 ENCOUNTER — Ambulatory Visit (HOSPITAL_COMMUNITY)
Admission: RE | Admit: 2012-07-15 | Discharge: 2012-07-15 | Disposition: A | Discharge status: Home / Self Care | Payer: Medicare Other | Source: Ambulatory Visit | Attending: Cardiovascular Disease | Admitting: Cardiovascular Disease

## 2012-07-15 DIAGNOSIS — X58XXXA Exposure to other specified factors, initial encounter: Secondary | ICD-10-CM | POA: Insufficient documentation

## 2012-07-15 DIAGNOSIS — T07XXXA Unspecified multiple injuries, initial encounter: Secondary | ICD-10-CM | POA: Insufficient documentation

## 2012-07-15 DIAGNOSIS — T148XXA Other injury of unspecified body region, initial encounter: Secondary | ICD-10-CM

## 2012-07-15 DIAGNOSIS — I739 Peripheral vascular disease, unspecified: Secondary | ICD-10-CM | POA: Insufficient documentation

## 2012-07-15 NOTE — Progress Notes (Signed)
BLE arterial duplex completed. [name] [name]  

## 2012-08-03 ENCOUNTER — Encounter (HOSPITAL_BASED_OUTPATIENT_CLINIC_OR_DEPARTMENT_OTHER): Payer: Medicare Other | Attending: General Surgery

## 2012-08-03 DIAGNOSIS — L97409 Non-pressure chronic ulcer of unspecified heel and midfoot with unspecified severity: Secondary | ICD-10-CM | POA: Insufficient documentation

## 2012-08-03 DIAGNOSIS — E1169 Type 2 diabetes mellitus with other specified complication: Secondary | ICD-10-CM | POA: Insufficient documentation

## 2012-08-07 ENCOUNTER — Other Ambulatory Visit (HOSPITAL_COMMUNITY): Payer: Self-pay | Admitting: Cardiovascular Disease

## 2012-08-07 DIAGNOSIS — I2510 Atherosclerotic heart disease of native coronary artery without angina pectoris: Secondary | ICD-10-CM

## 2012-08-10 ENCOUNTER — Encounter (HOSPITAL_COMMUNITY): Payer: Self-pay | Admitting: Pharmacy Technician

## 2012-08-14 ENCOUNTER — Encounter (HOSPITAL_COMMUNITY): Payer: Medicare Other

## 2012-08-16 ENCOUNTER — Ambulatory Visit (HOSPITAL_COMMUNITY)
Admission: RE | Admit: 2012-08-16 | Discharge: 2012-08-18 | Disposition: A | Discharge status: Home / Self Care | Payer: Medicare Other | Source: Ambulatory Visit | Attending: Cardiovascular Disease | Admitting: Cardiovascular Disease

## 2012-08-16 ENCOUNTER — Encounter (HOSPITAL_COMMUNITY): Payer: Self-pay

## 2012-08-16 DIAGNOSIS — I480 Paroxysmal atrial fibrillation: Secondary | ICD-10-CM | POA: Diagnosis present

## 2012-08-16 DIAGNOSIS — E119 Type 2 diabetes mellitus without complications: Secondary | ICD-10-CM | POA: Diagnosis present

## 2012-08-16 DIAGNOSIS — I2510 Atherosclerotic heart disease of native coronary artery without angina pectoris: Secondary | ICD-10-CM | POA: Diagnosis present

## 2012-08-16 DIAGNOSIS — N183 Chronic kidney disease, stage 3 unspecified: Secondary | ICD-10-CM | POA: Insufficient documentation

## 2012-08-16 DIAGNOSIS — I998 Other disorder of circulatory system: Secondary | ICD-10-CM | POA: Diagnosis present

## 2012-08-16 DIAGNOSIS — Z951 Presence of aortocoronary bypass graft: Secondary | ICD-10-CM | POA: Insufficient documentation

## 2012-08-16 DIAGNOSIS — L98499 Non-pressure chronic ulcer of skin of other sites with unspecified severity: Secondary | ICD-10-CM | POA: Insufficient documentation

## 2012-08-16 DIAGNOSIS — N319 Neuromuscular dysfunction of bladder, unspecified: Secondary | ICD-10-CM | POA: Diagnosis present

## 2012-08-16 DIAGNOSIS — I70229 Atherosclerosis of native arteries of extremities with rest pain, unspecified extremity: Secondary | ICD-10-CM | POA: Diagnosis present

## 2012-08-16 DIAGNOSIS — I129 Hypertensive chronic kidney disease with stage 1 through stage 4 chronic kidney disease, or unspecified chronic kidney disease: Secondary | ICD-10-CM | POA: Insufficient documentation

## 2012-08-16 DIAGNOSIS — I708 Atherosclerosis of other arteries: Secondary | ICD-10-CM | POA: Insufficient documentation

## 2012-08-16 DIAGNOSIS — I739 Peripheral vascular disease, unspecified: Secondary | ICD-10-CM | POA: Diagnosis present

## 2012-08-16 DIAGNOSIS — L97409 Non-pressure chronic ulcer of unspecified heel and midfoot with unspecified severity: Secondary | ICD-10-CM | POA: Insufficient documentation

## 2012-08-16 DIAGNOSIS — E785 Hyperlipidemia, unspecified: Secondary | ICD-10-CM | POA: Diagnosis present

## 2012-08-16 DIAGNOSIS — I10 Essential (primary) hypertension: Secondary | ICD-10-CM | POA: Diagnosis present

## 2012-08-16 DIAGNOSIS — I4891 Unspecified atrial fibrillation: Secondary | ICD-10-CM | POA: Insufficient documentation

## 2012-08-16 DIAGNOSIS — N2889 Other specified disorders of kidney and ureter: Secondary | ICD-10-CM | POA: Diagnosis present

## 2012-08-16 HISTORY — DX: Type 2 diabetes mellitus without complications: E11.9

## 2012-08-16 HISTORY — DX: Peripheral vascular disease, unspecified: I73.9

## 2012-08-16 HISTORY — DX: Acute myocardial infarction, unspecified: I21.9

## 2012-08-16 LAB — CBC
HCT: 36.8 % — ABNORMAL LOW (ref 39.0–52.0)
Hemoglobin: 11.9 g/dL — ABNORMAL LOW (ref 13.0–17.0)
Hemoglobin: 12.4 g/dL — ABNORMAL LOW (ref 13.0–17.0)
MCH: 31.2 pg (ref 26.0–34.0)
MCH: 31.5 pg (ref 26.0–34.0)
MCHC: 33.1 g/dL (ref 30.0–36.0)
MCHC: 33.7 g/dL (ref 30.0–36.0)
MCV: 94 fL (ref 78.0–100.0)
RBC: 3.82 MIL/uL — ABNORMAL LOW (ref 4.22–5.81)
RBC: 3.94 MIL/uL — ABNORMAL LOW (ref 4.22–5.81)

## 2012-08-16 LAB — BASIC METABOLIC PANEL
BUN: 42 mg/dL — ABNORMAL HIGH (ref 6–23)
CO2: 26 mEq/L (ref 19–32)
Calcium: 9.2 mg/dL (ref 8.4–10.5)
Calcium: 9.3 mg/dL (ref 8.4–10.5)
GFR calc non Af Amer: 40 mL/min — ABNORMAL LOW (ref >90)
GFR calc non Af Amer: 42 mL/min — ABNORMAL LOW (ref >90)
Glucose, Bld: 173 mg/dL — ABNORMAL HIGH (ref 70–99)
Glucose, Bld: 74 mg/dL (ref 70–99)
Potassium: 4.5 mEq/L (ref 3.5–5.1)
Sodium: 141 mEq/L (ref 135–145)

## 2012-08-16 LAB — GLUCOSE, CAPILLARY: Glucose-Capillary: 74 mg/dL (ref 70–99)

## 2012-08-16 MED ORDER — METOPROLOL TARTRATE 50 MG PO TABS
50.0000 mg | ORAL_TABLET | Freq: Two times a day (BID) | ORAL | Status: DC
  Administered 2012-08-16 – 2012-08-18 (×4): 50 mg via ORAL
  Filled 2012-08-16 (×6): qty 1

## 2012-08-16 MED ORDER — ASPIRIN 325 MG PO TABS
325.0000 mg | ORAL_TABLET | Freq: Every day | ORAL | Status: DC
  Administered 2012-08-17 – 2012-08-18 (×2): 325 mg via ORAL
  Filled 2012-08-16 (×2): qty 1

## 2012-08-16 MED ORDER — ACETAMINOPHEN 325 MG PO TABS: 650.0000 mg | ORAL_TABLET | ORAL | Status: DC | PRN

## 2012-08-16 MED ORDER — ALISKIREN FUMARATE 150 MG PO TABS: 300.0000 mg | ORAL_TABLET | ORAL | Status: DC

## 2012-08-16 MED ORDER — DOXAZOSIN MESYLATE 2 MG PO TABS
2.0000 mg | ORAL_TABLET | Freq: Every day | ORAL | Status: DC
  Administered 2012-08-16 – 2012-08-17 (×2): 2 mg via ORAL
  Filled 2012-08-16 (×4): qty 1

## 2012-08-16 MED ORDER — SODIUM CHLORIDE 0.9 % IJ SOLN: 3.0000 mL | Freq: Two times a day (BID) | INTRAMUSCULAR | Status: DC

## 2012-08-16 MED ORDER — ASPIRIN 81 MG PO CHEW
324.0000 mg | CHEWABLE_TABLET | ORAL | Status: AC
  Administered 2012-08-17: 324 mg via ORAL
  Filled 2012-08-16: qty 4

## 2012-08-16 MED ORDER — GABAPENTIN 300 MG PO CAPS
600.0000 mg | ORAL_CAPSULE | Freq: Every day | ORAL | Status: DC
  Filled 2012-08-16: qty 2

## 2012-08-16 MED ORDER — ALISKIREN-HYDROCHLOROTHIAZIDE 300-25 MG PO TABS: 1.0000 | ORAL_TABLET | ORAL | Status: DC

## 2012-08-16 MED ORDER — HYDROCHLOROTHIAZIDE 25 MG PO TABS: 25.0000 mg | ORAL_TABLET | ORAL | Status: DC

## 2012-08-16 MED ORDER — GABAPENTIN 300 MG PO CAPS: 300.0000 mg | ORAL_CAPSULE | ORAL | Status: DC

## 2012-08-16 MED ORDER — AMLODIPINE BESYLATE 10 MG PO TABS
10.0000 mg | ORAL_TABLET | Freq: Every day | ORAL | Status: DC
  Administered 2012-08-17 – 2012-08-18 (×2): 10 mg via ORAL
  Filled 2012-08-16 (×2): qty 1

## 2012-08-16 MED ORDER — ONDANSETRON HCL 4 MG/2ML IJ SOLN: 4.0000 mg | Freq: Four times a day (QID) | INTRAMUSCULAR | Status: DC | PRN

## 2012-08-16 MED ORDER — GABAPENTIN 300 MG PO CAPS
300.0000 mg | ORAL_CAPSULE | Freq: Every day | ORAL | Status: DC
  Administered 2012-08-18: 300 mg via ORAL
  Filled 2012-08-16 (×3): qty 1

## 2012-08-16 MED ORDER — OXYCODONE-ACETAMINOPHEN 5-325 MG PO TABS: 1.0000 | ORAL_TABLET | ORAL | Status: DC

## 2012-08-16 MED ORDER — GABAPENTIN 300 MG PO CAPS
600.0000 mg | ORAL_CAPSULE | Freq: Two times a day (BID) | ORAL | Status: DC
  Administered 2012-08-16 – 2012-08-17 (×3): 600 mg via ORAL
  Filled 2012-08-16 (×6): qty 2

## 2012-08-16 MED ORDER — ALPRAZOLAM 0.25 MG PO TABS: 0.2500 mg | ORAL_TABLET | Freq: Three times a day (TID) | ORAL | Status: DC | PRN

## 2012-08-16 MED ORDER — SODIUM CHLORIDE 0.9 % IJ SOLN: 3.0000 mL | INTRAMUSCULAR | Status: DC | PRN

## 2012-08-16 MED ORDER — OXYCODONE-ACETAMINOPHEN 5-325 MG PO TABS
1.0000 | ORAL_TABLET | Freq: Every day | ORAL | Status: DC
  Administered 2012-08-17: 18:00:00 1 via ORAL

## 2012-08-16 MED ORDER — INSULIN GLARGINE 100 UNIT/ML ~~LOC~~ SOLN
20.0000 [IU] | Freq: Every day | SUBCUTANEOUS | Status: DC
  Administered 2012-08-17 – 2012-08-18 (×2): 20 [IU] via SUBCUTANEOUS

## 2012-08-16 MED ORDER — SODIUM CHLORIDE 0.9 % IV SOLN: 250.0000 mL | INTRAVENOUS | Status: DC | PRN

## 2012-08-16 MED ORDER — SODIUM CHLORIDE 0.9 % IV SOLN
INTRAVENOUS | Status: DC
  Administered 2012-08-16: 50 mL/h via INTRAVENOUS

## 2012-08-16 MED ORDER — OXYCODONE-ACETAMINOPHEN 5-325 MG PO TABS
2.0000 | ORAL_TABLET | Freq: Two times a day (BID) | ORAL | Status: DC
  Administered 2012-08-16 – 2012-08-18 (×4): 2 via ORAL
  Filled 2012-08-16 (×2): qty 2
  Filled 2012-08-16: qty 1
  Filled 2012-08-16 (×2): qty 2

## 2012-08-16 MED ORDER — ZOLPIDEM TARTRATE 5 MG PO TABS: 5.0000 mg | ORAL_TABLET | Freq: Every evening | ORAL | Status: DC | PRN

## 2012-08-16 MED ORDER — EZETIMIBE 10 MG PO TABS
10.0000 mg | ORAL_TABLET | Freq: Every day | ORAL | Status: DC
  Administered 2012-08-17 – 2012-08-18 (×2): 10 mg via ORAL
  Filled 2012-08-16 (×2): qty 1

## 2012-08-16 NOTE — Progress Notes (Addendum)
Pt was just admitted for pre-cath hydration. He is scheduled for PV angio tomorrow, 2/17, with Dr. Allyson Sabal. An office H&P, by Dr. Allyson Sabal, will get scanned in to patient's record. Will hydrate overnight w/ IVF. Pre-cath orders will be placed and he will be NPO after midnight.  Brittainy M. Delmer Islam  Agree with note written by Boyce Medici  [facility]  Admitted for [facility] angio day before for hydration to prevent RCN. Pre procedure orders complete and home meds ordered. Will hold Tekturna/HCTZ.  Runell Gess 08/16/2012 4:25 PM

## 2012-08-17 ENCOUNTER — Encounter (HOSPITAL_COMMUNITY)
Admission: RE | Disposition: A | Discharge status: Home / Self Care | Payer: Self-pay | Source: Ambulatory Visit | Attending: Cardiovascular Disease

## 2012-08-17 DIAGNOSIS — I739 Peripheral vascular disease, unspecified: Secondary | ICD-10-CM | POA: Diagnosis present

## 2012-08-17 DIAGNOSIS — I10 Essential (primary) hypertension: Secondary | ICD-10-CM | POA: Diagnosis present

## 2012-08-17 DIAGNOSIS — N319 Neuromuscular dysfunction of bladder, unspecified: Secondary | ICD-10-CM | POA: Diagnosis present

## 2012-08-17 DIAGNOSIS — I2510 Atherosclerotic heart disease of native coronary artery without angina pectoris: Secondary | ICD-10-CM | POA: Diagnosis present

## 2012-08-17 DIAGNOSIS — I998 Other disorder of circulatory system: Secondary | ICD-10-CM | POA: Diagnosis present

## 2012-08-17 DIAGNOSIS — I480 Paroxysmal atrial fibrillation: Secondary | ICD-10-CM | POA: Diagnosis present

## 2012-08-17 DIAGNOSIS — E119 Type 2 diabetes mellitus without complications: Secondary | ICD-10-CM | POA: Diagnosis present

## 2012-08-17 LAB — GLUCOSE, CAPILLARY
Glucose-Capillary: 73 mg/dL (ref 70–99)
Glucose-Capillary: 50 mg/dL — ABNORMAL LOW (ref 70–99)
Glucose-Capillary: 50 mg/dL — ABNORMAL LOW (ref 70–99)
Glucose-Capillary: 91 mg/dL (ref 70–99)

## 2012-08-17 LAB — BASIC METABOLIC PANEL
BUN: 40 mg/dL — ABNORMAL HIGH (ref 6–23)
CO2: 25 mEq/L (ref 19–32)
GFR calc non Af Amer: 39 mL/min — ABNORMAL LOW (ref >90)
Glucose, Bld: 100 mg/dL — ABNORMAL HIGH (ref 70–99)
Potassium: 3.8 mEq/L (ref 3.5–5.1)
Sodium: 140 mEq/L (ref 135–145)

## 2012-08-17 SURGERY — ABDOMINAL ANGIOGRAM
Laterality: Right

## 2012-08-17 MED ORDER — ACETAMINOPHEN 325 MG PO TABS: 650.0000 mg | ORAL_TABLET | ORAL | Status: DC | PRN

## 2012-08-17 MED ORDER — ONDANSETRON HCL 4 MG/2ML IJ SOLN: 4.0000 mg | Freq: Four times a day (QID) | INTRAMUSCULAR | Status: DC | PRN

## 2012-08-17 MED ORDER — SODIUM CHLORIDE 0.9 % IV SOLN
INTRAVENOUS | Status: AC
  Administered 2012-08-17: 16:00:00 via INTRAVENOUS

## 2012-08-17 MED ORDER — MORPHINE SULFATE 2 MG/ML IJ SOLN
2.0000 mg | INTRAMUSCULAR | Status: DC | PRN
  Administered 2012-08-17: 17:00:00 2 mg via INTRAVENOUS
  Filled 2012-08-17: qty 1

## 2012-08-17 MED ORDER — LIDOCAINE HCL (PF) 1 % IJ SOLN
INTRAMUSCULAR | Status: AC
  Filled 2012-08-17: qty 30

## 2012-08-17 MED ORDER — HEPARIN SODIUM (PORCINE) 1000 UNIT/ML IJ SOLN
INTRAMUSCULAR | Status: AC
  Filled 2012-08-17: qty 1

## 2012-08-17 MED ORDER — TRAMADOL HCL 50 MG PO TABS: 50.0000 mg | ORAL_TABLET | Freq: Four times a day (QID) | ORAL | Status: DC | PRN

## 2012-08-17 MED ORDER — FENTANYL CITRATE 0.05 MG/ML IJ SOLN
INTRAMUSCULAR | Status: AC
  Filled 2012-08-17: qty 2

## 2012-08-17 NOTE — H&P (Signed)
  H & P will be scanned in.  Pt was reexamined and existing H & P reviewed. No changes found.  Runell Gess, MD [facility] 08/17/2012 1:42 PM

## 2012-08-17 NOTE — Progress Notes (Signed)
[facility]  Subjective: No complaints.   Objective: Vital signs in last 24 hours: Temp:  [97.8 F (36.6 C)-99 F (37.2 C)] 97.8 F (36.6 C) (02/17 0429) Pulse Rate:  [68-84] 68 (02/17 0429) Resp:  [18-19] 19 (02/17 0429) BP: (122-135)/(53-68) 122/53 mmHg (02/17 0429) SpO2:  [96 %-99 %] 97 % (02/17 0429) Weight:  [167 lb 15.9 oz (76.2 kg)] 167 lb 15.9 oz (76.2 kg) (02/16 1259) Last BM Date: 08/16/12  Intake/Output from previous day: 02/16 0701 - 02/17 0700 In: -  Out: 1600 [Urine:1600] Intake/Output this shift:    Medications Current Facility-Administered Medications  Medication Dose Route Frequency Provider Last Rate Last Dose  . 0.9 %  sodium chloride infusion  250 mL Intravenous PRN Brittainy Simmons, PA-C      . 0.9 %  sodium chloride infusion   Intravenous Continuous Brittainy Simmons, PA-C 50 mL/hr at 08/16/12 2010 50 mL/hr at 08/16/12 2010  . acetaminophen (TYLENOL) tablet 650 mg  650 mg Oral Q4H PRN Robbie Lis, PA-C      . [START ON 08/18/2012] aliskiren (TEKTURNA) tablet 300 mg  300 mg Oral Q48H Runell Gess, MD       And  . Melene Muller ON 08/18/2012] hydrochlorothiazide (HYDRODIURIL) tablet 25 mg  25 mg Oral Q48H Runell Gess, MD      . ALPRAZolam Prudy Feeler) tablet 0.25 mg  0.25 mg Oral TID PRN Brittainy Simmons, PA-C      . amLODipine (NORVASC) tablet 10 mg  10 mg Oral Daily Brittainy Simmons, PA-C      . aspirin tablet 325 mg  325 mg Oral Daily Brittainy Simmons, PA-C      . doxazosin (CARDURA) tablet 2 mg  2 mg Oral QHS Brittainy Simmons, PA-C   2 mg at 08/16/12 2214  . ezetimibe (ZETIA) tablet 10 mg  10 mg Oral Daily Brittainy Simmons, PA-C      . gabapentin (NEURONTIN) capsule 300 mg  300 mg Oral Q1200 Runell Gess, MD      . gabapentin (NEURONTIN) capsule 600 mg  600 mg Oral BID Runell Gess, MD   600 mg at 08/16/12 2215  . insulin glargine (LANTUS) injection 20 Units  20 Units Subcutaneous Daily Brittainy Simmons,  PA-C      . metoprolol (LOPRESSOR) tablet 50 mg  50 mg Oral BID Brittainy Simmons, PA-C   50 mg at 08/16/12 2214  . ondansetron (ZOFRAN) injection 4 mg  4 mg Intravenous Q6H PRN Brittainy Simmons, PA-C      . oxyCODONE-acetaminophen (PERCOCET/ROXICET) 5-325 MG per tablet 1 tablet  1 tablet Oral Q1200 Runell Gess, MD      . oxyCODONE-acetaminophen (PERCOCET/ROXICET) 5-325 MG per tablet 2 tablet  2 tablet Oral BID Runell Gess, MD   2 tablet at 08/16/12 2213  . sodium chloride 0.9 % injection 3 mL  3 mL Intravenous Q12H Brittainy Simmons, PA-C      . sodium chloride 0.9 % injection 3 mL  3 mL Intravenous PRN Brittainy Simmons, PA-C      . zolpidem (AMBIEN) tablet 5 mg  5 mg Oral QHS PRN Robbie Lis, PA-C        PE: General appearance: alert, cooperative and no distress Lungs: clear to auscultation bilaterally Heart: regular rate and rhythm Extremities: no LEE Pulses: absent pedal pulses, 2+ femorals Skin: warm and dry Neurologic: Grossly normal  Lab Results:   Recent Labs  08/16/12 1819 08/16/12 2228  WBC 7.1 7.3  HGB 11.9* 12.4*  HCT 35.9* 36.8*  PLT 179 194   BMET  Recent Labs  08/16/12 1819 08/16/12 2228 08/17/12 0607  NA 139 141 140  K 4.5 4.0 3.8  CL 104 106 107  CO2 26 25 25   GLUCOSE 173* 74 100*  BUN 42* 40* 40*  CREATININE 1.55* 1.51* 1.59*  CALCIUM 9.2 9.3 8.9   PT/INR  Recent Labs  08/16/12 2228  LABPROT 13.6  INR 1.05   Studies/Results:  Out Patient ABIs - 07/2012 Right - 0.58 Left - 0.65   Assessment/Plan  Principal Problem:   Critical lower limb ischemia - Right-sided non-healing foot ulcer Active Problems:   PAD- Rt. ABI of 0.58, Lt 0.65 -07/2012   CAD - CABG x 4 in 2006   PAF - not on chronic anticoagulation due to high bleeding risk   HTN (hypertension)   Diabetes - Type II   Neurogenic bladder  Plan: Pt is scheduled for PV Angio today w/ Dr. Allyson Sabal for critical limb ischemia/ non-healing foot ulcer of the right lower  extremity. He has documented PAD w/ recent ABIs of 0.58 on the right and 0.65 on the left. His creatinine when he saw Dr. Allyson Sabal on 2/7 was 1.5. He was admitted overnight for hydration with IVF. He was placed on 50 mL/hr of NS. Cr today is 1.59.  His Aliskiren-HCTZ is being held this am. HR and BP are both stable. Plan for PV Angio later today. **His office HP by Dr. Allyson Sabal on 08/07/12 is in his chart and will get scanned into Epic by medical records.**   LOS: 1 day    Brittainy M. Delmer Islam 08/17/2012 10:08 AM  Agree with note written by Boyce Medici  PAC  For PV angio today. SCr 1.59.  Runell Gess 08/17/2012 10:46 AM

## 2012-08-17 NOTE — CV Procedure (Signed)
[name] [name] is a 79 y.o. male    [id number] LOCATION:  FACILITY: [facility]  PHYSICIAN: Nanetta Batty, M.D. 10/27/1928   DATE OF PROCEDURE:  08/17/2012  DATE OF DISCHARGE:  [facility]  PV Angiogram    History obtained from chart review. [name] [name] is a 79 year old thin appearing married Caucasian male father of one who is a patient of Dr. Lorelee Market. He's had multiple surgical procedures involving his a sitting aorta and ultimately underwent coronary artery bypass grafting x4 by Dr. Edwina Barth August of 2006. His other problems include paroxysmal atrial fibrillation, hypertension, type 2 diabetes as well as a neurogenic bladder. He was on Coumadin remotely which was stopped because of bleeding. 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 He has a nonhealing wound of his on his right heel and Dopplers in her office performed 07/15/2012 revealed a right ABI of 0.58 on the high-frequency signal in his distal right SFA and one vessel runoff via the peroneal. His left ABI was 0.65 with a significant lesion in his left external iliac artery. He presents now after being admitted the night before for hydration for angiography and potential intervention for critical limb ischemia.   PROCEDURE DESCRIPTION:    The patient was brought to the second floor [facility] cath lab in the postabsorptive state. He was  premedicated withValium 5 mg by mouth and IV fentanyl.Marland Kitchen His left groinwas prepped and shaved in usual sterile fashion. Xylocaine 1% was used  for local anesthesia. A 5 French sheath was inserted into the left common femoral artery using standard Seldinger technique. The patient received  4000 units  of heparin  intravenously.  The ACT was measured at 247. Total contrast and this patient was 98 cc. A 5 French pigtail catheter was used for abdominal aortography. A 5 French  crossover catheter, Glidewire and 4 French angled catheter was used for right lower extremity angiography using bolus chase digital subtraction step table technique. Visipaque dye was used for the entirety of the case. Retrograde aortic pressure was monitored during the case.    HEMODYNAMICS:    AO SYSTOLIC/AO DIASTOLIC: 155/58   ANGIOGRAPHIC RESULTS:   1: Abdominal aorta gram: The distal abdominal aorta was calcified and tortuous.  2: Left iliac-there is a high-grade lesion in the left external iliac artery there was occlusive with a 5 French sheath.  3: Right lower extremity-the right SFA was diffusely calcified in its mid and distal third. There was a 90% stenosis in the distal right SFA and the abductor canal with 0 vessel runoff. The peroneal is a dominant vessel that was subtotally occluded and recanalized filling any dorsal pedal arch.  Procedure description: In order to perform the right lower Jimmy runoff safely a PTCA was performed of the left external iliac artery using a 4 x 2 balloon resulting in an adequate angioplasty result.   IMPRESSION:[name] [name] has a high grade calcified distal right SFA stenosis with critical limb ischemia and 0 vessel runoff. I was unable to cross the bifurcation and revascularize him retrograde. Because of his creatinine clearance of 40 and the fact weight is 100 cc of contrast the decision was made to stop the procedure, hydrate him overnight and bring her back for a recatheterization procedure using antegrade stick, diamondback orbital rotational atherectomy,PTA and stenting using an IDEV sef expanding  stent.  Runell Gess MD, [facility] 08/17/2012 3:04 PM

## 2012-08-18 DIAGNOSIS — N183 Chronic kidney disease, stage 3 unspecified: Secondary | ICD-10-CM | POA: Diagnosis present

## 2012-08-18 DIAGNOSIS — E785 Hyperlipidemia, unspecified: Secondary | ICD-10-CM | POA: Diagnosis present

## 2012-08-18 LAB — BASIC METABOLIC PANEL
BUN: 35 mg/dL — ABNORMAL HIGH (ref 6–23)
CO2: 26 mEq/L (ref 19–32)
Calcium: 8.8 mg/dL (ref 8.4–10.5)
Creatinine, Ser: 1.6 mg/dL — ABNORMAL HIGH (ref 0.50–1.35)

## 2012-08-18 LAB — CBC
MCH: 31 pg (ref 26.0–34.0)
MCHC: 32.8 g/dL (ref 30.0–36.0)
MCV: 94.2 fL (ref 78.0–100.0)
Platelets: 169 10*3/uL (ref 150–400)
RDW: 13.5 % (ref 11.5–15.5)
WBC: 6.6 10*3/uL (ref 4.0–10.5)

## 2012-08-18 MED ORDER — ACETAMINOPHEN 325 MG PO TABS: 650.0000 mg | ORAL_TABLET | ORAL | Status: DC | PRN

## 2012-08-18 NOTE — Progress Notes (Signed)
Subjective:  No complaints  Objective:  Vital Signs in the last 24 hours: Temp:  [97.4 F (36.3 C)-98.2 F (36.8 C)] 97.9 F (36.6 C) (02/18 0759) Pulse Rate:  [60-86] 73 (02/18 0759) Resp:  [16-18] 16 (02/18 0759) BP: (111-158)/(36-84) 158/50 mmHg (02/18 0759) SpO2:  [95 %-100 %] 97 % (02/18 0759) Weight:  [75.6 kg (166 lb 10.7 oz)] 75.6 kg (166 lb 10.7 oz) (02/18 0511)  Intake/Output from previous day:  Intake/Output Summary (Last 24 hours) at 08/18/12 1030 Last data filed at 08/18/12 0900  Gross per 24 hour  Intake   1790 ml  Output   1325 ml  Net    465 ml    Physical Exam: General appearance: alert, cooperative, appears stated age and no distress Lungs: clear to auscultation bilaterally Heart: irregularly irregular rhythm Lt groin without hematoma   Rate: 78  Rhythm: atrial fibrillation  Lab Results:  Recent Labs  08/16/12 2228 08/18/12 0625  WBC 7.3 6.6  HGB 12.4* 11.3*  PLT 194 169    Recent Labs  08/17/12 0607 08/18/12 0625  NA 140 143  K 3.8 4.7  CL 107 109  CO2 25 26  GLUCOSE 100* 108*  BUN 40* 35*  CREATININE 1.59* 1.60*   No results found for this basename: TROPONINI, CK, MB,  in the last 72 hours Hepatic Function Panel No results found for this basename: PROT, ALBUMIN, AST, ALT, ALKPHOS, BILITOT, BILIDIR, IBILI,  in the last 72 hours No results found for this basename: CHOL,  in the last 72 hours  Recent Labs  08/16/12 2228  INR 1.05    Imaging: No results found.  Cardiac Studies:  Assessment/Plan:   Principal Problem:   Critical lower limb ischemia - Right-sided non-healing foot ulcer Active Problems:   CAD - CABG x 4 in 2006   PAF - not on chronic anticoagulation due to high bleeding risk   Diabetes - Type II   PAD- Rt. ABI of 0.58, Lt 0.65 -07/2012   Chronic renal insufficiency, stage III (moderate)   HTN (hypertension)   Neurogenic bladder   Dyslipidemia, statin intol   Plan- discharge, follow up with Dr Allyson Sabal in  March. Will need to stop Tekturna/HCTZ before PCI.Marland Kitchen   [employer] PA-C 08/18/2012, 10:30 AM

## 2012-08-18 NOTE — Discharge Summary (Signed)
Patient ID: [name] [name],  MRN: [id number], DOB/AGE: 07/19/1928 79 y.o.  Admit date: 08/16/2012 Discharge date: 08/18/2012  Primary Care Provider:  Primary Cardiologist: Dr Tresa Endo  Discharge Diagnoses Principal Problem:   Critical lower limb ischemia - Right-sided non-healing foot ulcer Active Problems:   CAD - CABG x 4 in 2006   PAF - not on chronic anticoagulation due to high bleeding risk   Diabetes - Type II   PAD- Rt. ABI of 0.58, Lt 0.65 -07/2012   Chronic renal insufficiency, stage III (moderate)   HTN (hypertension)   Neurogenic bladder   Dyslipidemia, statin intol    Procedures: PV angiogram   Hospital Course:  [name] [name] is a 79 year old thin appearing married Caucasian male father of one who is a patient of Dr. Lorelee Market. He's had multiple surgical procedures and ultimately underwent coronary artery bypass grafting x4 by Dr. Edwina Barth August of 2006. His other problems include paroxysmal atrial fibrillation, hypertension, stage 3 CRI, type 2 diabetes as well as a neurogenic bladder. He was on Coumadin remotely which was stopped because of bleeding. 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 He uses a walker at home. He has a nonhealing wound of his on his right heel and Dopplers in her office performed 07/15/2012 revealed a right ABI of 0.58 on the high-frequency signal in his distal right SFA and one vessel runoff via the peroneal. His left ABI was 0.65 with a significant lesion in his left external iliac artery. He presents now after being admitted the night before for hydration for angiography and potential intervention for critical limb ischemia. PVA was done 08/17/12 and revealed a high grade calcified distal right SFA stenosis with critical limb ischemia and 0 vessel runoff. Dr Allyson Sabal was unable to cross the bifurcation and revascularize him retrograde. Because of his creatinine clearance of 40 and  the fact we used 100 cc of contrast the decision was made to stop the procedure, hydrate him overnight and bring him back for a recatheterization procedure using antegrade stick, diamondback orbital rotational atherectomy,PTA and stenting using an IDEV sef expanding stent. He will need his Tekturna/HCTZ held before this. He will see Dr Allyson Sabal in follow up 3/7.    Discharge Vitals:  Blood pressure 158/50, pulse 73, temperature 97.9 F (36.6 C), temperature source Oral, resp. rate 16, height 5\' 11"  (1.803 m), weight 75.6 kg (166 lb 10.7 oz), SpO2 97.00%.    Labs: Results for orders placed during the hospital encounter of 08/16/12 (from the past 48 hour(s))  GLUCOSE, CAPILLARY     Status: None   Collection Time    08/16/12  4:22 PM      Result Value Range   Glucose-Capillary 74  70 - 99 mg/dL   Comment 1 Notify RN    BASIC METABOLIC PANEL     Status: Abnormal   Collection Time    08/16/12  6:19 PM      Result Value Range   Sodium 139  135 - 145 mEq/L   Potassium 4.5  3.5 - 5.1 mEq/L   Chloride 104  96 - 112 mEq/L   CO2 26  19 - 32 mEq/L   Glucose, Bld 173 (*) 70 - 99 mg/dL   BUN 42 (*) 6 - 23 mg/dL   Creatinine, Ser 4.09 (*) 0.50 - 1.35 mg/dL   Calcium 9.2  8.4 - 81.1 mg/dL   GFR calc non  Af Amer 40 (*) >90 mL/min   GFR calc Af Amer 47 (*) >90 mL/min   Comment:            The eGFR has been calculated     using the CKD EPI equation.     This calculation has not been     validated in all clinical     situations.     eGFR's persistently     <90 mL/min signify     possible Chronic Kidney Disease.  CBC     Status: Abnormal   Collection Time    08/16/12  6:19 PM      Result Value Range   WBC 7.1  4.0 - 10.5 K/uL   RBC 3.82 (*) 4.22 - 5.81 MIL/uL   Hemoglobin 11.9 (*) 13.0 - 17.0 g/dL   HCT 96.0 (*) 45.4 - 09.8 %   MCV 94.0  78.0 - 100.0 fL   MCH 31.2  26.0 - 34.0 pg   MCHC 33.1  30.0 - 36.0 g/dL   RDW 11.9  14.7 - 82.9 %   Platelets 179  150 - 400 K/uL  GLUCOSE, CAPILLARY      Status: None   Collection Time    08/16/12  9:37 PM      Result Value Range   Glucose-Capillary 90  70 - 99 mg/dL  BASIC METABOLIC PANEL     Status: Abnormal   Collection Time    08/16/12 10:28 PM      Result Value Range   Sodium 141  135 - 145 mEq/L   Potassium 4.0  3.5 - 5.1 mEq/L   Chloride 106  96 - 112 mEq/L   CO2 25  19 - 32 mEq/L   Glucose, Bld 74  70 - 99 mg/dL   BUN 40 (*) 6 - 23 mg/dL   Creatinine, Ser 5.62 (*) 0.50 - 1.35 mg/dL   Calcium 9.3  8.4 - 13.0 mg/dL   GFR calc non Af Amer 42 (*) >90 mL/min   GFR calc Af Amer 48 (*) >90 mL/min   Comment:            The eGFR has been calculated     using the CKD EPI equation.     This calculation has not been     validated in all clinical     situations.     eGFR's persistently     <90 mL/min signify     possible Chronic Kidney Disease.  PROTIME-INR     Status: None   Collection Time    08/16/12 10:28 PM      Result Value Range   Prothrombin Time 13.6  11.6 - 15.2 seconds   INR 1.05  0.00 - 1.49  CBC     Status: Abnormal   Collection Time    08/16/12 10:28 PM      Result Value Range   WBC 7.3  4.0 - 10.5 K/uL   RBC 3.94 (*) 4.22 - 5.81 MIL/uL   Hemoglobin 12.4 (*) 13.0 - 17.0 g/dL   HCT 86.5 (*) 78.4 - 69.6 %   MCV 93.4  78.0 - 100.0 fL   MCH 31.5  26.0 - 34.0 pg   MCHC 33.7  30.0 - 36.0 g/dL   RDW 29.5  28.4 - 13.2 %   Platelets 194  150 - 400 K/uL  BASIC METABOLIC PANEL     Status: Abnormal   Collection Time    08/17/12  6:07 AM  Result Value Range   Sodium 140  135 - 145 mEq/L   Potassium 3.8  3.5 - 5.1 mEq/L   Chloride 107  96 - 112 mEq/L   CO2 25  19 - 32 mEq/L   Glucose, Bld 100 (*) 70 - 99 mg/dL   BUN 40 (*) 6 - 23 mg/dL   Creatinine, Ser 1.61 (*) 0.50 - 1.35 mg/dL   Calcium 8.9  8.4 - 09.6 mg/dL   GFR calc non Af Amer 39 (*) >90 mL/min   GFR calc Af Amer 45 (*) >90 mL/min   Comment:            The eGFR has been calculated     using the CKD EPI equation.     This calculation has not been      validated in all clinical     situations.     eGFR's persistently     <90 mL/min signify     possible Chronic Kidney Disease.  GLUCOSE, CAPILLARY     Status: None   Collection Time    08/17/12  6:08 AM      Result Value Range   Glucose-Capillary 96  70 - 99 mg/dL   Comment 1 Documented in Chart     Comment 2 Notify RN    GLUCOSE, CAPILLARY     Status: None   Collection Time    08/17/12 11:19 AM      Result Value Range   Glucose-Capillary 73  70 - 99 mg/dL   Comment 1 Notify RN    POCT ACTIVATED CLOTTING TIME     Status: None   Collection Time    08/17/12  2:26 PM      Result Value Range   Activated Clotting Time 247    GLUCOSE, CAPILLARY     Status: Abnormal   Collection Time    08/17/12  3:17 PM      Result Value Range   Glucose-Capillary 50 (*) 70 - 99 mg/dL  GLUCOSE, CAPILLARY     Status: Abnormal   Collection Time    08/17/12  3:31 PM      Result Value Range   Glucose-Capillary 50 (*) 70 - 99 mg/dL  POCT ACTIVATED CLOTTING TIME     Status: None   Collection Time    08/17/12  4:31 PM      Result Value Range   Activated Clotting Time 176    GLUCOSE, CAPILLARY     Status: None   Collection Time    08/17/12  4:44 PM      Result Value Range   Glucose-Capillary 91  70 - 99 mg/dL  GLUCOSE, CAPILLARY     Status: Abnormal   Collection Time    08/17/12  9:44 PM      Result Value Range   Glucose-Capillary 278 (*) 70 - 99 mg/dL   Comment 1 Notify RN     Comment 2 Documented in Chart    BASIC METABOLIC PANEL     Status: Abnormal   Collection Time    08/18/12  6:25 AM      Result Value Range   Sodium 143  135 - 145 mEq/L   Potassium 4.7  3.5 - 5.1 mEq/L   Comment: DELTA CHECK NOTED     MARKED HEMOLYSIS   Chloride 109  96 - 112 mEq/L   CO2 26  19 - 32 mEq/L   Glucose, Bld 108 (*) 70 - 99 mg/dL   BUN 35 (*)  6 - 23 mg/dL   Creatinine, Ser 1.61 (*) 0.50 - 1.35 mg/dL   Calcium 8.8  8.4 - 09.6 mg/dL   GFR calc non Af Amer 39 (*) >90 mL/min   GFR calc Af Amer 45  (*) >90 mL/min   Comment:            The eGFR has been calculated     using the CKD EPI equation.     This calculation has not been     validated in all clinical     situations.     eGFR's persistently     <90 mL/min signify     possible Chronic Kidney Disease.  CBC     Status: Abnormal   Collection Time    08/18/12  6:25 AM      Result Value Range   WBC 6.6  4.0 - 10.5 K/uL   RBC 3.65 (*) 4.22 - 5.81 MIL/uL   Hemoglobin 11.3 (*) 13.0 - 17.0 g/dL   HCT 04.5 (*) 40.9 - 81.1 %   MCV 94.2  78.0 - 100.0 fL   MCH 31.0  26.0 - 34.0 pg   MCHC 32.8  30.0 - 36.0 g/dL   RDW 91.4  78.2 - 95.6 %   Platelets 169  150 - 400 K/uL  GLUCOSE, CAPILLARY     Status: Abnormal   Collection Time    08/18/12  8:01 AM      Result Value Range   Glucose-Capillary 105 (*) 70 - 99 mg/dL   Comment 1 Notify RN      Disposition:  Follow-up Information   Follow up with Runell Gess, MD On 09/04/2012. (3:45 pm)    Contact information:   [address] [place] [address] [phone]       Discharge Medications:    Medication List    TAKE these medications       acetaminophen 325 MG tablet  Commonly known as:  TYLENOL  Take 2 tablets (650 mg total) by mouth every 4 (four) hours as needed.     amLODipine 10 MG tablet  Commonly known as:  NORVASC  Take 10 mg by mouth daily.     aspirin 325 MG tablet  Take 325 mg by mouth daily.     doxazosin 2 MG tablet  Commonly known as:  CARDURA  Take 2 mg by mouth at bedtime.     ezetimibe 10 MG tablet  Commonly known as:  ZETIA  Take 10 mg by mouth daily.     gabapentin 300 MG capsule  Commonly known as:  NEURONTIN  Take 300 mg by mouth See admin instructions. 2 tabs every morning, 1 tab around lunch and 2 tabs at bedtime; 5 tabs total each day.     insulin glargine 100 UNIT/ML injection  Commonly known as:  LANTUS  Inject 20 Units into the skin daily.     metoprolol 50 MG tablet  Commonly known as:  LOPRESSOR  Take 50  mg by mouth 2 (two) times daily.     oxyCODONE-acetaminophen 5-325 MG per tablet  Commonly known as:  PERCOCET/ROXICET  Take 1 tablet by mouth See admin instructions. 2 tabs every morning, 1 tab around lunch and 2 tabs at bedtime; 5 tabs total each day.     TEKTURNA HCT 300-25 MG Tabs  Generic drug:  Aliskiren-Hydrochlorothiazide  Take 1 tablet by mouth every other day.          Duration of Discharge Encounter:  45 minutes including physician time.  Jolene Provost PA-C 08/18/2012 10:39 AM

## 2012-08-31 ENCOUNTER — Encounter (HOSPITAL_BASED_OUTPATIENT_CLINIC_OR_DEPARTMENT_OTHER): Payer: Medicare Other | Attending: General Surgery

## 2012-08-31 DIAGNOSIS — E1169 Type 2 diabetes mellitus with other specified complication: Secondary | ICD-10-CM | POA: Insufficient documentation

## 2012-08-31 DIAGNOSIS — L97409 Non-pressure chronic ulcer of unspecified heel and midfoot with unspecified severity: Secondary | ICD-10-CM | POA: Insufficient documentation

## 2012-09-09 ENCOUNTER — Encounter (HOSPITAL_COMMUNITY): Payer: Self-pay | Admitting: Pharmacy Technician

## 2012-09-14 ENCOUNTER — Encounter (HOSPITAL_COMMUNITY): Payer: Self-pay | Admitting: Family Medicine

## 2012-09-14 ENCOUNTER — Observation Stay (HOSPITAL_COMMUNITY)
Admission: RE | Admit: 2012-09-14 | Discharge: 2012-09-16 | Disposition: A | Discharge status: Home / Self Care | Payer: Medicare Other | Source: Ambulatory Visit | Attending: Cardiovascular Disease | Admitting: Cardiovascular Disease

## 2012-09-14 DIAGNOSIS — I998 Other disorder of circulatory system: Secondary | ICD-10-CM | POA: Insufficient documentation

## 2012-09-14 DIAGNOSIS — I10 Essential (primary) hypertension: Secondary | ICD-10-CM

## 2012-09-14 DIAGNOSIS — I129 Hypertensive chronic kidney disease with stage 1 through stage 4 chronic kidney disease, or unspecified chronic kidney disease: Secondary | ICD-10-CM | POA: Insufficient documentation

## 2012-09-14 DIAGNOSIS — N183 Chronic kidney disease, stage 3 unspecified: Secondary | ICD-10-CM | POA: Insufficient documentation

## 2012-09-14 DIAGNOSIS — I739 Peripheral vascular disease, unspecified: Secondary | ICD-10-CM | POA: Diagnosis present

## 2012-09-14 DIAGNOSIS — I480 Paroxysmal atrial fibrillation: Secondary | ICD-10-CM | POA: Diagnosis present

## 2012-09-14 DIAGNOSIS — I2510 Atherosclerotic heart disease of native coronary artery without angina pectoris: Secondary | ICD-10-CM | POA: Diagnosis present

## 2012-09-14 DIAGNOSIS — E119 Type 2 diabetes mellitus without complications: Secondary | ICD-10-CM | POA: Diagnosis present

## 2012-09-14 DIAGNOSIS — N319 Neuromuscular dysfunction of bladder, unspecified: Secondary | ICD-10-CM | POA: Insufficient documentation

## 2012-09-14 DIAGNOSIS — E785 Hyperlipidemia, unspecified: Secondary | ICD-10-CM | POA: Diagnosis present

## 2012-09-14 DIAGNOSIS — I70209 Unspecified atherosclerosis of native arteries of extremities, unspecified extremity: Principal | ICD-10-CM | POA: Insufficient documentation

## 2012-09-14 HISTORY — DX: Atherosclerotic heart disease of native coronary artery without angina pectoris: I25.10

## 2012-09-14 HISTORY — DX: Essential (primary) hypertension: I10

## 2012-09-14 LAB — PROTIME-INR: Prothrombin Time: 13.8 seconds (ref 11.6–15.2)

## 2012-09-14 LAB — GLUCOSE, CAPILLARY
Glucose-Capillary: 106 mg/dL — ABNORMAL HIGH (ref 70–99)
Glucose-Capillary: 175 mg/dL — ABNORMAL HIGH (ref 70–99)

## 2012-09-14 LAB — CBC
MCH: 30.7 pg (ref 26.0–34.0)
Platelets: 171 10*3/uL (ref 150–400)
RBC: 3.65 MIL/uL — ABNORMAL LOW (ref 4.22–5.81)
WBC: 7 10*3/uL (ref 4.0–10.5)

## 2012-09-14 LAB — BASIC METABOLIC PANEL
Chloride: 107 mEq/L (ref 96–112)
GFR calc Af Amer: 43 mL/min — ABNORMAL LOW (ref >90)
Potassium: 4.6 mEq/L (ref 3.5–5.1)
Sodium: 139 mEq/L (ref 135–145)

## 2012-09-14 MED ORDER — SODIUM CHLORIDE 0.9 % IV SOLN: 250.0000 mL | INTRAVENOUS | Status: DC | PRN

## 2012-09-14 MED ORDER — OXYCODONE-ACETAMINOPHEN 5-325 MG PO TABS: 1.0000 | ORAL_TABLET | ORAL | Status: DC

## 2012-09-14 MED ORDER — SODIUM CHLORIDE 0.9 % IJ SOLN: 3.0000 mL | Freq: Two times a day (BID) | INTRAMUSCULAR | Status: DC

## 2012-09-14 MED ORDER — ZOLPIDEM TARTRATE 5 MG PO TABS: 5.0000 mg | ORAL_TABLET | Freq: Every evening | ORAL | Status: DC | PRN

## 2012-09-14 MED ORDER — SODIUM CHLORIDE 0.9 % IJ SOLN: 3.0000 mL | INTRAMUSCULAR | Status: DC | PRN

## 2012-09-14 MED ORDER — EZETIMIBE 10 MG PO TABS
10.0000 mg | ORAL_TABLET | Freq: Every day | ORAL | Status: DC
  Administered 2012-09-15 – 2012-09-16 (×2): 10 mg via ORAL
  Filled 2012-09-14 (×2): qty 1

## 2012-09-14 MED ORDER — GABAPENTIN 300 MG PO CAPS
600.0000 mg | ORAL_CAPSULE | Freq: Every day | ORAL | Status: DC
  Administered 2012-09-14 – 2012-09-15 (×2): 600 mg via ORAL
  Filled 2012-09-14 (×4): qty 2

## 2012-09-14 MED ORDER — GABAPENTIN 300 MG PO CAPS: 300.0000 mg | ORAL_CAPSULE | ORAL | Status: DC

## 2012-09-14 MED ORDER — SODIUM CHLORIDE 0.9 % IV SOLN
INTRAVENOUS | Status: DC
  Administered 2012-09-14: 20:00:00 via INTRAVENOUS

## 2012-09-14 MED ORDER — ACETAMINOPHEN 325 MG PO TABS
650.0000 mg | ORAL_TABLET | ORAL | Status: DC | PRN
  Filled 2012-09-14: qty 2

## 2012-09-14 MED ORDER — OXYCODONE-ACETAMINOPHEN 5-325 MG PO TABS
2.0000 | ORAL_TABLET | Freq: Two times a day (BID) | ORAL | Status: DC
  Administered 2012-09-14 – 2012-09-16 (×4): 2 via ORAL
  Filled 2012-09-14 (×4): qty 2

## 2012-09-14 MED ORDER — OXYCODONE-ACETAMINOPHEN 5-325 MG PO TABS: 2.0000 | ORAL_TABLET | Freq: Every day | ORAL | Status: DC

## 2012-09-14 MED ORDER — OXYCODONE-ACETAMINOPHEN 5-325 MG PO TABS
2.0000 | ORAL_TABLET | ORAL | Status: AC
  Administered 2012-09-14: 2 via ORAL

## 2012-09-14 MED ORDER — GABAPENTIN 300 MG PO CAPS
600.0000 mg | ORAL_CAPSULE | ORAL | Status: DC
  Administered 2012-09-15 – 2012-09-16 (×2): 600 mg via ORAL
  Filled 2012-09-14 (×4): qty 2

## 2012-09-14 MED ORDER — DOXAZOSIN MESYLATE 2 MG PO TABS
2.0000 mg | ORAL_TABLET | Freq: Every day | ORAL | Status: DC
Start: 2012-09-14 — End: 2012-09-16
  Administered 2012-09-14 – 2012-09-15 (×2): 2 mg via ORAL
  Filled 2012-09-14 (×4): qty 1

## 2012-09-14 MED ORDER — INSULIN GLARGINE 100 UNIT/ML ~~LOC~~ SOLN
20.0000 [IU] | Freq: Every day | SUBCUTANEOUS | Status: DC
  Administered 2012-09-16: 20 [IU] via SUBCUTANEOUS
  Filled 2012-09-14 (×2): qty 0.2

## 2012-09-14 MED ORDER — OXYCODONE-ACETAMINOPHEN 5-325 MG PO TABS
2.0000 | ORAL_TABLET | ORAL | Status: DC
  Filled 2012-09-14: qty 2

## 2012-09-14 MED ORDER — METOPROLOL TARTRATE 50 MG PO TABS
50.0000 mg | ORAL_TABLET | Freq: Two times a day (BID) | ORAL | Status: DC
  Administered 2012-09-14 – 2012-09-16 (×4): 50 mg via ORAL
  Filled 2012-09-14 (×6): qty 1

## 2012-09-14 MED ORDER — LIDOCAINE HCL 2 % EX GEL
Freq: Once | CUTANEOUS | Status: DC
  Filled 2012-09-14: qty 5

## 2012-09-14 MED ORDER — GABAPENTIN 300 MG PO CAPS
300.0000 mg | ORAL_CAPSULE | ORAL | Status: DC
  Administered 2012-09-15 – 2012-09-16 (×2): 300 mg via ORAL
  Filled 2012-09-14 (×2): qty 1

## 2012-09-14 MED ORDER — OXYCODONE-ACETAMINOPHEN 5-325 MG PO TABS
1.0000 | ORAL_TABLET | Freq: Every day | ORAL | Status: DC
  Administered 2012-09-15 – 2012-09-16 (×2): 1 via ORAL
  Filled 2012-09-14 (×2): qty 1

## 2012-09-14 MED ORDER — ASPIRIN 81 MG PO CHEW
324.0000 mg | CHEWABLE_TABLET | ORAL | Status: AC
  Administered 2012-09-15: 324 mg via ORAL
  Filled 2012-09-14: qty 4

## 2012-09-14 MED ORDER — ALPRAZOLAM 0.25 MG PO TABS
0.2500 mg | ORAL_TABLET | Freq: Three times a day (TID) | ORAL | Status: DC | PRN
  Administered 2012-09-16: 10:00:00 0.25 mg via ORAL
  Filled 2012-09-14: qty 1

## 2012-09-14 MED ORDER — ASPIRIN 325 MG PO TABS
325.0000 mg | ORAL_TABLET | Freq: Every day | ORAL | Status: DC
  Administered 2012-09-15 – 2012-09-16 (×2): 325 mg via ORAL
  Filled 2012-09-14 (×2): qty 1

## 2012-09-14 MED ORDER — AMLODIPINE BESYLATE 10 MG PO TABS
10.0000 mg | ORAL_TABLET | Freq: Every day | ORAL | Status: DC
  Administered 2012-09-15 – 2012-09-16 (×2): 10 mg via ORAL
  Filled 2012-09-14 (×2): qty 1

## 2012-09-14 NOTE — Progress Notes (Signed)
Pt was just admitted for pre-cath hydration. He is scheduled for PV angio tomorrow, 3/18, with Dr. Allyson Sabal. An office H&P, by Dr. Allyson Sabal, will get scanned in to patient's record. Will hydrate overnight w/ IVF. Pre-cath orders have been placed and he will be NPO after midnight. Home med's have been ordered. Will hold Tekturna/HCTZ. The patient is roomed and stable.   Devanshi Califf M. Sharol Harness, PA-C [facility]

## 2012-09-14 NOTE — Progress Notes (Signed)
Order to maintain foley . Inserted 16 french foley catheter at 1800. Immediate return of 800cc dark yellow urine noted in foley bag . Patient tolerated procedure well. Patient did complain of lower  abdominal pain . Continue with plan of care .  Cleotilde Neer

## 2012-09-15 ENCOUNTER — Encounter (HOSPITAL_COMMUNITY)
Admission: RE | Disposition: A | Discharge status: Home / Self Care | Payer: Self-pay | Source: Ambulatory Visit | Attending: Cardiovascular Disease

## 2012-09-15 HISTORY — PX: ATHERECTOMY: SHX5502

## 2012-09-15 HISTORY — PX: ATHERECTOMY: SHX47

## 2012-09-15 LAB — GLUCOSE, CAPILLARY
Glucose-Capillary: 67 mg/dL — ABNORMAL LOW (ref 70–99)
Glucose-Capillary: 99 mg/dL (ref 70–99)
Glucose-Capillary: 61 mg/dL — ABNORMAL LOW (ref 70–99)
Glucose-Capillary: 186 mg/dL — ABNORMAL HIGH (ref 70–99)

## 2012-09-15 LAB — BASIC METABOLIC PANEL
CO2: 21 mEq/L (ref 19–32)
Chloride: 108 mEq/L (ref 96–112)
Sodium: 140 mEq/L (ref 135–145)

## 2012-09-15 LAB — PROTIME-INR
INR: 1.16 (ref 0.00–1.49)
Prothrombin Time: 14.6 seconds (ref 11.6–15.2)

## 2012-09-15 LAB — POCT ACTIVATED CLOTTING TIME: Activated Clotting Time: 171 seconds

## 2012-09-15 LAB — CBC
Hemoglobin: 10.6 g/dL — ABNORMAL LOW (ref 13.0–17.0)
RBC: 3.48 MIL/uL — ABNORMAL LOW (ref 4.22–5.81)
WBC: 6.7 10*3/uL (ref 4.0–10.5)

## 2012-09-15 SURGERY — ATHERECTOMY
Anesthesia: LOCAL

## 2012-09-15 MED ORDER — NITROGLYCERIN IN D5W 200-5 MCG/ML-% IV SOLN
INTRAVENOUS | Status: AC
  Filled 2012-09-15: qty 250

## 2012-09-15 MED ORDER — MIDAZOLAM HCL 2 MG/2ML IJ SOLN
INTRAMUSCULAR | Status: AC
  Filled 2012-09-15: qty 2

## 2012-09-15 MED ORDER — MORPHINE SULFATE 2 MG/ML IJ SOLN
2.0000 mg | INTRAMUSCULAR | Status: DC | PRN
  Administered 2012-09-16: 10:00:00 2 mg via INTRAVENOUS
  Filled 2012-09-15: qty 1

## 2012-09-15 MED ORDER — ASPIRIN EC 325 MG PO TBEC: 325.0000 mg | DELAYED_RELEASE_TABLET | Freq: Every day | ORAL | Status: DC

## 2012-09-15 MED ORDER — HEPARIN SODIUM (PORCINE) 1000 UNIT/ML IJ SOLN
INTRAMUSCULAR | Status: AC
  Filled 2012-09-15: qty 1

## 2012-09-15 MED ORDER — ONDANSETRON HCL 4 MG/2ML IJ SOLN: 4.0000 mg | Freq: Four times a day (QID) | INTRAMUSCULAR | Status: DC | PRN

## 2012-09-15 MED ORDER — LIDOCAINE HCL (PF) 1 % IJ SOLN
INTRAMUSCULAR | Status: AC
  Filled 2012-09-15: qty 30

## 2012-09-15 MED ORDER — FENTANYL CITRATE 0.05 MG/ML IJ SOLN
INTRAMUSCULAR | Status: AC
  Filled 2012-09-15: qty 2

## 2012-09-15 MED ORDER — SODIUM CHLORIDE 0.9 % IV SOLN
INTRAVENOUS | Status: AC
  Administered 2012-09-15: 20:00:00 via INTRAVENOUS

## 2012-09-15 MED ORDER — VERAPAMIL HCL 2.5 MG/ML IV SOLN
INTRAVENOUS | Status: AC
  Filled 2012-09-15: qty 2

## 2012-09-15 MED ORDER — ACETAMINOPHEN 325 MG PO TABS
650.0000 mg | ORAL_TABLET | ORAL | Status: DC | PRN
  Administered 2012-09-16: 650 mg via ORAL

## 2012-09-15 NOTE — Progress Notes (Signed)
Subjective:  No CP/SOB  Objective:  Temp:  [97.4 F (36.3 C)-98 F (36.7 C)] 97.9 F (36.6 C) (03/18 0500) Pulse Rate:  [69-120] 69 (03/18 0500) Resp:  [18-20] 18 (03/18 0500) BP: (126-146)/(57-66) 134/57 mmHg (03/18 0500) SpO2:  [91 %-96 %] 96 % (03/18 0500) Weight:  [76.794 kg (169 lb 4.8 oz)-77.747 kg (171 lb 6.4 oz)] 77.747 kg (171 lb 6.4 oz) (03/18 0500) Weight change:   Intake/Output from previous day: 03/17 0701 - 03/18 0700 In: 600 [P.O.:600] Out: 1125 [Urine:1125]  Intake/Output from this shift:    Physical Exam: General appearance: alert, cooperative and no distress Neck: no adenopathy, no carotid bruit, no JVD, supple, symmetrical, trachea midline and thyroid not enlarged, symmetric, no tenderness/mass/nodules Lungs: clear to auscultation bilaterally Heart: regular rate and rhythm, S1, S2 normal, no murmur, click, rub or gallop Extremities: extremities normal, atraumatic, no cyanosis or edema  Lab Results: Results for orders placed during the hospital encounter of 09/14/12 (from the past 48 hour(s))  GLUCOSE, CAPILLARY     Status: Abnormal   Collection Time    09/14/12 12:35 PM      Result Value Range   Glucose-Capillary 106 (*) 70 - 99 mg/dL   Comment 1 Notify RN    BASIC METABOLIC PANEL     Status: Abnormal   Collection Time    09/14/12  3:59 PM      Result Value Range   Sodium 139  135 - 145 mEq/L   Potassium 4.6  3.5 - 5.1 mEq/L   Chloride 107  96 - 112 mEq/L   CO2 24  19 - 32 mEq/L   Glucose, Bld 191 (*) 70 - 99 mg/dL   BUN 46 (*) 6 - 23 mg/dL   Creatinine, Ser 8.29 (*) 0.50 - 1.35 mg/dL   Calcium 8.7  8.4 - 56.2 mg/dL   GFR calc non Af Amer 37 (*) >90 mL/min   GFR calc Af Amer 43 (*) >90 mL/min   Comment:            The eGFR has been calculated     using the CKD EPI equation.     This calculation has not been     validated in all clinical     situations.     eGFR's persistently     <90 mL/min signify     possible Chronic Kidney Disease.   PROTIME-INR     Status: None   Collection Time    09/14/12  3:59 PM      Result Value Range   Prothrombin Time 13.8  11.6 - 15.2 seconds   INR 1.07  0.00 - 1.49  CBC     Status: Abnormal   Collection Time    09/14/12  3:59 PM      Result Value Range   WBC 7.0  4.0 - 10.5 K/uL   RBC 3.65 (*) 4.22 - 5.81 MIL/uL   Hemoglobin 11.2 (*) 13.0 - 17.0 g/dL   HCT 13.0 (*) 86.5 - 78.4 %   MCV 94.8  78.0 - 100.0 fL   MCH 30.7  26.0 - 34.0 pg   MCHC 32.4  30.0 - 36.0 g/dL   RDW 69.6  29.5 - 28.4 %   Platelets 171  150 - 400 K/uL  GLUCOSE, CAPILLARY     Status: Abnormal   Collection Time    09/14/12  4:33 PM      Result Value Range   Glucose-Capillary 178 (*) 70 - 99  mg/dL   Comment 1 Notify RN    GLUCOSE, CAPILLARY     Status: Abnormal   Collection Time    09/14/12  8:49 PM      Result Value Range   Glucose-Capillary 175 (*) 70 - 99 mg/dL  BASIC METABOLIC PANEL     Status: Abnormal   Collection Time    09/15/12  4:45 AM      Result Value Range   Sodium 140  135 - 145 mEq/L   Potassium 4.1  3.5 - 5.1 mEq/L   Chloride 108  96 - 112 mEq/L   CO2 21  19 - 32 mEq/L   Glucose, Bld 54 (*) 70 - 99 mg/dL   BUN 48 (*) 6 - 23 mg/dL   Creatinine, Ser 0.86 (*) 0.50 - 1.35 mg/dL   Calcium 8.6  8.4 - 57.8 mg/dL   GFR calc non Af Amer 35 (*) >90 mL/min   GFR calc Af Amer 41 (*) >90 mL/min   Comment:            The eGFR has been calculated     using the CKD EPI equation.     This calculation has not been     validated in all clinical     situations.     eGFR's persistently     <90 mL/min signify     possible Chronic Kidney Disease.  PROTIME-INR     Status: None   Collection Time    09/15/12  4:45 AM      Result Value Range   Prothrombin Time 14.6  11.6 - 15.2 seconds   INR 1.16  0.00 - 1.49  CBC     Status: Abnormal   Collection Time    09/15/12  4:45 AM      Result Value Range   WBC 6.7  4.0 - 10.5 K/uL   RBC 3.48 (*) 4.22 - 5.81 MIL/uL   Hemoglobin 10.6 (*) 13.0 - 17.0 g/dL    HCT 46.9 (*) 62.9 - 52.0 %   MCV 92.5  78.0 - 100.0 fL   MCH 30.5  26.0 - 34.0 pg   MCHC 32.9  30.0 - 36.0 g/dL   RDW 52.8  41.3 - 24.4 %   Platelets 164  150 - 400 K/uL  GLUCOSE, CAPILLARY     Status: Abnormal   Collection Time    09/15/12  7:42 AM      Result Value Range   Glucose-Capillary 67 (*) 70 - 99 mg/dL    Imaging: Imaging results have been reviewed  Assessment/Plan:   1. Active Problems: 2.   * No active hospital problems. * 3.   Time Spent Directly with Patient:  20 minutes  Length of Stay:  LOS: 1 day   Admitted yesterday for hydration prior to PV intervention today. SCr 1.73. Exam benign. For [facility] atherectomy [facility] +/- PTA/Stent for CLI later today.  Runell Gess 09/15/2012, 9:12 AM

## 2012-09-15 NOTE — Progress Notes (Signed)
Site area: right groin  Site Prior to Removal:  Level 0  Pressure Applied For 20 MINUTES    Minutes Beginning at 1935  Manual:   yes  Patient Status During Pull:  stable  Post Pull Groin Site:  Level 0  Post Pull Instructions Given:  yes  Post Pull Pulses Present:  yes  Dressing Applied:  yes  Comments:

## 2012-09-15 NOTE — CV Procedure (Signed)
[name] is a 79 y.o. male    [id number] LOCATION:  FACILITY: [facility]  PHYSICIAN: Nanetta Batty, M.D. 12/24/1928   DATE OF PROCEDURE:  09/15/2012  DATE OF DISCHARGE:  [facility]  CARDIAC CATHETERIZATION     History obtained from chart review.[name] [name] is a 79 year old thin appearing married Caucasian male father of one who is a patient of Dr. Lorelee Market. He's had multiple surgical procedures involving his a sitting aorta and ultimately underwent coronary artery bypass grafting x4 by Dr. Edwina Barth August of 2006. His other problems include paroxysmal atrial fibrillation, hypertension, type 2 diabetes as well as a neurogenic bladder. He was on Coumadin remotely which was stopped because of bleeding. 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 He has a nonhealing wound of his on his right heel and Dopplers in her office performed 07/15/2012 revealed a right ABI of 0.58 on the high-frequency signal in his distal right SFA and one vessel runoff via the peroneal. His left ABI was 0.65 with a significant lesion in his left external iliac artery. He presents now after being admitted the night before for hydration for angiography and potential intervention for critical limb ischemia. He was discharged home and presents now for diamondback orbital rotational atherectomy, PTA plus or minus stenting .    PROCEDURE DESCRIPTION:    The patient was brought to the second floor [place] [facility] cath lab in the postabsorptive state. He was premedicated with Valium 5 mg by mouth, IV Versed and fentanyl.Marland Kitchen His right groin was prepped and shaved in usual sterile fashion. Xylocaine 1% was used for local anesthesia. A 6 French Bright Tip sheath was inserted into the right common femoral  artery antegrade using standard Seldinger technique and micropuncture needle.. The patient received 7  thousand units  of heparin  intravenously.  The ending ACT was 219. A total of 73 cc of contrast was administered to the patient.  An 014 Rigalia wire was preloaded into a long 014 and whole CXI Catheter.this cross the lesion with relatively use and then was exchanged for an 014 viper wire. A 6 millimeter Nav 6 distal protection device was then deployed in the above-the-knee popliteal artery. Diamondback orbital rotational atherectomy was performed performed using a 2 mm solid crown up to a maximum of 120,000 RPM. Following this angioplasty was performed using a 5 mm x 120 mm long chocolate balloon for 2 minute inflations. Final angiographic result with reduction of a 80% calcified distal right SFA stenosis to less than 20% long segmental 60-70% calcified mid right SFA to less than 20%. There were 2 small linear dissections there were sub-intimal and were not flow limiting.    HEMODYNAMICS:    AO SYSTOLIC/AO DIASTOLIC: 152/61    ANGIOGRAPHIC RESULTS:   Successful diamondback orbital rotational atherectomy, PTA with chocolate balloon resulting in reduction of high grade calcified mid to distal right SFA stenoses to less than 20% with excellent flow. Angiography below the knee revealed an intact peroneal artery.    IMPRESSION:the patient was loaded with aspirin prior to the case. He will be hydrated overnight and discharged home in the morning.  Runell Gess MD, [facility] 09/15/2012 4:13 PM

## 2012-09-15 NOTE — H&P (Signed)
    Pt was reexamined and existing H & P reviewed. No changes found.  Runell Gess, MD [facility] 09/15/2012 2:38 PM

## 2012-09-16 LAB — BASIC METABOLIC PANEL
GFR calc non Af Amer: 41 mL/min — ABNORMAL LOW (ref >90)
Glucose, Bld: 156 mg/dL — ABNORMAL HIGH (ref 70–99)
Potassium: 4.7 mEq/L (ref 3.5–5.1)
Sodium: 141 mEq/L (ref 135–145)

## 2012-09-16 LAB — CBC
Hemoglobin: 10.8 g/dL — ABNORMAL LOW (ref 13.0–17.0)
MCH: 30.7 pg (ref 26.0–34.0)
RBC: 3.52 MIL/uL — ABNORMAL LOW (ref 4.22–5.81)
WBC: 6 10*3/uL (ref 4.0–10.5)

## 2012-09-16 LAB — GLUCOSE, CAPILLARY
Glucose-Capillary: 140 mg/dL — ABNORMAL HIGH (ref 70–99)
Glucose-Capillary: 169 mg/dL — ABNORMAL HIGH (ref 70–99)

## 2012-09-16 NOTE — Progress Notes (Signed)
Utilization Review Completed Camron Monday J. Heber Hoog, RN, BSN, NCM [phone]  

## 2012-09-16 NOTE — Plan of Care (Signed)
Problem: Phase I Progression Outcomes Goal: Voiding-avoid urinary catheter unless indicated Outcome: Not Met (add Reason) Pt self-caths at home.  Pt requests foley remain in until discharge time to prevent need to cath here.  Foley care performed.

## 2012-09-16 NOTE — Progress Notes (Signed)
[facility]  Subjective: Feels great.  Objective: Vital signs in last 24 hours: Temp:  [97.4 F (36.3 C)-97.9 F (36.6 C)] 97.7 F (36.5 C) (03/19 0827) Pulse Rate:  [67-96] 96 (03/19 0830) Resp:  [17-18] 18 (03/18 1650) BP: (112-159)/(42-85) 159/85 mmHg (03/19 0830) SpO2:  [95 %-99 %] 95 % (03/19 0830) Weight:  [78 kg (171 lb 15.3 oz)] 78 kg (171 lb 15.3 oz) (03/19 0428) Last BM Date: 09/15/12  Intake/Output from previous day: 03/18 0701 - 03/19 0700 In: 640 [P.O.:360; I.V.:280] Out: 1675 [Urine:1675] Intake/Output this shift:    Medications Current Facility-Administered Medications  Medication Dose Route Frequency Provider Last Rate Last Dose  . acetaminophen (TYLENOL) tablet 650 mg  650 mg Oral Q4H PRN Abelino Derrick, PA-C      . acetaminophen (TYLENOL) tablet 650 mg  650 mg Oral Q4H PRN Runell Gess, MD   650 mg at 09/16/12 0951  . ALPRAZolam Prudy Feeler) tablet 0.25 mg  0.25 mg Oral TID PRN Abelino Derrick, PA-C   0.25 mg at 09/16/12 0951  . amLODipine (NORVASC) tablet 10 mg  10 mg Oral Daily Brittainy Simmons, PA-C   10 mg at 09/16/12 1006  . aspirin tablet 325 mg  325 mg Oral Daily Brittainy Simmons, PA-C   325 mg at 09/16/12 1006  . doxazosin (CARDURA) tablet 2 mg  2 mg Oral QHS Brittainy Simmons, PA-C   2 mg at 09/15/12 2223  . ezetimibe (ZETIA) tablet 10 mg  10 mg Oral Daily Brittainy Simmons, PA-C   10 mg at 09/16/12 1007  . gabapentin (NEURONTIN) capsule 600 mg  600 mg Oral Q24H Brett Fairy, RPH   600 mg at 09/16/12 0756   And  . gabapentin (NEURONTIN) capsule 300 mg  300 mg Oral Q24H Brett Fairy, RPH   300 mg at 09/16/12 1210   And  . gabapentin (NEURONTIN) capsule 600 mg  600 mg Oral QHS Brett Fairy, RPH   600 mg at 09/15/12 2222  . insulin glargine (LANTUS) injection 20 Units  20 Units Subcutaneous Daily Brittainy Simmons, PA-C   20 Units at 09/16/12 1008  . lidocaine (XYLOCAINE) 2 % jelly   Urethral Once Abelino Derrick, PA-C      .  metoprolol (LOPRESSOR) tablet 50 mg  50 mg Oral BID Brittainy Simmons, PA-C   50 mg at 09/16/12 1007  . morphine 2 MG/ML injection 2 mg  2 mg Intravenous Q1H PRN Runell Gess, MD   2 mg at 09/16/12 0951  . ondansetron (ZOFRAN) injection 4 mg  4 mg Intravenous Q6H PRN Runell Gess, MD      . oxyCODONE-acetaminophen (PERCOCET/ROXICET) 5-325 MG per tablet 1 tablet  1 tablet Oral Q1200 Runell Gess, MD   1 tablet at 09/16/12 1210  . oxyCODONE-acetaminophen (PERCOCET/ROXICET) 5-325 MG per tablet 2 tablet  2 tablet Oral Q12H Runell Gess, MD   2 tablet at 09/16/12 0530  . zolpidem (AMBIEN) tablet 5 mg  5 mg Oral QHS PRN Abelino Derrick, PA-C        PE: General appearance: alert, cooperative and no distress Lungs: clear to auscultation bilaterally Heart: regular rate and rhythm, S1, S2 normal, no murmur, click, rub or gallop Extremities: No LEE Pulses: Radials 2+ and symmetric, 1+ PT pulses.  Feet very warm. Skin: Warm and dry.  Right groin.  small hematoma, minimal tenderness, no ecchymosis or bruit  Neurologic: Grossly normal  Lab Results:  Recent Labs  09/14/12 1559 09/15/12 0445 09/16/12 0425  WBC 7.0 6.7 6.0  HGB 11.2* 10.6* 10.8*  HCT 34.6* 32.2* 32.3*  PLT 171 164 151   BMET  Recent Labs  09/14/12 1559 09/15/12 0445 09/16/12 0425  NA 139 140 141  K 4.6 4.1 4.7  CL 107 108 109  CO2 24 21 23   GLUCOSE 191* 54* 156*  BUN 46* 48* 38*  CREATININE 1.67* 1.73* 1.54*  CALCIUM 8.7 8.6 8.8   PT/INR  Recent Labs  09/14/12 1559 09/15/12 0445  LABPROT 13.8 14.6  INR 1.07 1.16   PROCEDURE DESCRIPTION:  The patient was brought to the second floor  Cardiac cath lab in the postabsorptive state. He was premedicated with Valium 5 mg by mouth, IV Versed and fentanyl.Marland Kitchen His right groin  was prepped and shaved in usual sterile fashion. Xylocaine 1% was used for local anesthesia. A 6 French Bright Tip sheath was inserted into the right common femoral  artery  antegrade using standard Seldinger technique and micropuncture needle.. The patient received 7 thousand units of heparin intravenously. The ending ACT was 219. A total of 73 cc of contrast was administered to the patient.  An 014 Rigalia wire was preloaded into a long 014 and whole CXI Catheter.this cross the lesion with relatively use and then was exchanged for an 014 viper wire. A 6 millimeter Nav 6 distal protection device was then deployed in the above-the-knee popliteal artery. Diamondback orbital rotational atherectomy was performed performed using a 2 mm solid crown up to a maximum of 120,000 RPM. Following this angioplasty was performed using a 5 mm x 120 mm long chocolate balloon for 2 minute inflations. Final angiographic result with reduction of a 80% calcified distal right SFA stenosis to less than 20% long segmental 60-70% calcified mid right SFA to less than 20%. There were 2 small linear dissections there were sub-intimal and were not flow limiting.  HEMODYNAMICS:  AO SYSTOLIC/AO DIASTOLIC: 152/61  ANGIOGRAPHIC RESULTS:  Successful diamondback orbital rotational atherectomy, PTA with chocolate balloon resulting in reduction of high grade calcified mid to distal right SFA stenoses to less than 20% with excellent flow. Angiography below the knee revealed an intact peroneal artery.  IMPRESSION:the patient was loaded with aspirin prior to the case. He will be hydrated overnight and discharged home in the morning.  Runell Gess MD, [facility]  09/15/2012  4:13 PM  Assessment/Plan  Principal Problem:   PAD- Rt. ABI of 0.58, Lt 0.65 -07/2012 Active Problems:   CAD - CABG x 4 in 2006   PAF - not on chronic anticoagulation due to high bleeding risk   HTN (hypertension)   Diabetes - Type II   Chronic renal insufficiency, stage III (moderate)   Dyslipidemia, statin intol  Plan:  SP Successful diamondback orbital rotational atherectomy, PTA  resulting in reduction of high grade calcified mid  to distal right SFA stenoses to less than 20% with excellent flow. Angiography below the knee revealed an intact peroneal artery.  SCr improved.   OK for DC.  FU with Dr. Allyson Sabal.    LOS: 2 days    HAGER, BRYAN 09/16/2012 12:17 PM    Patient seen and examined. Agree with assessment and plan. R leg and foot warm, good pulses. No chest pain. Groin stable. DC today.   Lennette Bihari, MD, [facility] 09/16/2012 12:47 PM

## 2012-09-17 ENCOUNTER — Other Ambulatory Visit: Payer: Self-pay | Admitting: Physician Assistant

## 2012-09-17 DIAGNOSIS — I739 Peripheral vascular disease, unspecified: Secondary | ICD-10-CM

## 2012-09-21 NOTE — Discharge Summary (Signed)
Physician Discharge Summary  Patient ID: [name] [name] MRN: [id number] DOB/AGE: June 10, 1929 79 y.o.  Admit date: 09/14/2012 Discharge date: 09/21/2012  Admission Diagnoses:  Peripheral arterial Disease  Discharge Diagnoses:  Principal Problem:   PAD- Rt. ABI of 0.58, Lt 0.65 -07/2012 Active Problems:   CAD - CABG x 4 in 2006   PAF - not on chronic anticoagulation due to high bleeding risk   HTN (hypertension)   Diabetes - Type II   Chronic renal insufficiency, stage III (moderate)   Dyslipidemia, statin intol   Discharged Condition: stable  Hospital Course:   Mr. Weinand is a 79 year old thin appearing married Caucasian male father of one who is a patient of Dr. Lorelee Market. He's had multiple surgical procedures involving his a sitting aorta and ultimately underwent coronary artery bypass grafting x4 by Dr. Edwina Barth August of 2006. His other problems include paroxysmal atrial fibrillation, hypertension, type 2 diabetes as well as a neurogenic bladder. He was on Coumadin remotely which was stopped because of bleeding. 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 Zachery Conch at 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 He has a nonhealing wound of his on his right heel and Dopplers in her office performed 07/15/2012 revealed a right ABI of 0.58 on the high-frequency signal in his distal right SFA and one vessel runoff via the peroneal. His left ABI was 0.65 with a significant lesion in his left external iliac artery. He presents now after being admitted the night before for hydration for angiography and potential intervention for critical limb ischemia. He was discharged home and presents now for diamondback orbital rotational atherectomy, PTA plus or minus stenting .  The patient underwent successful diamondback orbital rotational atherectomy, PTA with balloon resulting in reduction of high grade calcified mid to distal right SFA stenoses to less than 20% with  excellent flow.  Angiography below the knee revealed an intact peroneal artery.  The patient was discharged home in stable condition after being seen by Dr. Tresa Endo.  FU arranged.  Consults: None  Significant Diagnostic Studies:  HEMODYNAMICS:  AO SYSTOLIC/AO DIASTOLIC: 152/61  ANGIOGRAPHIC RESULTS:  Successful diamondback orbital rotational atherectomy, PTA with balloon resulting in reduction of high grade calcified mid to distal right SFA stenoses to less than 20% with excellent flow. Angiography below the knee revealed an intact peroneal artery.  IMPRESSION:the patient was loaded with aspirin prior to the case. He will be hydrated overnight and discharged home in the morning.  Runell Gess MD, [facility]  09/15/2012  4:13 PM  CBC    Component Value Date/Time   WBC 6.0 09/16/2012 0425   RBC 3.52* 09/16/2012 0425   HGB 10.8* 09/16/2012 0425   HCT 32.3* 09/16/2012 0425   PLT 151 09/16/2012 0425   MCV 91.8 09/16/2012 0425   MCH 30.7 09/16/2012 0425   MCHC 33.4 09/16/2012 0425   RDW 13.8 09/16/2012 0425    BMET    Component Value Date/Time   NA 141 09/16/2012 0425   K 4.7 09/16/2012 0425   CL 109 09/16/2012 0425   CO2 23 09/16/2012 0425   GLUCOSE 156* 09/16/2012 0425   BUN 38* 09/16/2012 0425   CREATININE 1.54* 09/16/2012 0425   CALCIUM 8.8 09/16/2012 0425   GFRNONAA 41* 09/16/2012 0425   GFRAA 47* 09/16/2012 0425   Treatments:  See above  Discharge Exam: Blood pressure 159/85, pulse 96, temperature 97.7 F (36.5 C), temperature source Oral, resp. rate 18, height 5\' 11"  (1.803  m), weight 78 kg (171 lb 15.3 oz), SpO2 95.00%.   Disposition: 01-Home or Self Care  Discharge Orders   Future Appointments Provider Department Dept Phone   10/01/2012 2:00 PM [facility] [address] [place] [facility] [address] [phone]   Future Orders Complete By Expires     Diet - low sodium heart healthy  As directed     Increase activity slowly  As directed         Medication List     TAKE these medications       amLODipine 10 MG tablet  Commonly known as:  NORVASC  Take 10 mg by mouth daily.     aspirin 325 MG tablet  Take 325 mg by mouth daily.     doxazosin 2 MG tablet  Commonly known as:  CARDURA  Take 2 mg by mouth at bedtime.     ezetimibe 10 MG tablet  Commonly known as:  ZETIA  Take 10 mg by mouth daily.     gabapentin 300 MG capsule  Commonly known as:  NEURONTIN  Take 300 mg by mouth See admin instructions. 2 tabs every morning, 1 tab around lunch and 2 tabs at bedtime; 5 tabs total each day.     insulin glargine 100 UNIT/ML injection  Commonly known as:  LANTUS  Inject 20 Units into the skin daily.     metoprolol 50 MG tablet  Commonly known as:  LOPRESSOR  Take 50 mg by mouth 2 (two) times daily.     oxyCODONE-acetaminophen 5-325 MG per tablet  Commonly known as:  PERCOCET/ROXICET  Take 1 tablet by mouth See admin instructions. 2 tabs every morning, 1 tab around lunch and 2 tabs at bedtime; 5 tabs total each day.     TEKTURNA HCT 300-25 MG Tabs  Generic drug:  Aliskiren-Hydrochlorothiazide  Take 1 tablet by mouth every other day.           Follow-up Information   Follow up with Runell Gess, MD On 10/08/2012. (11:15PM )    Contact information:   [address] [place] [address] [phone]       Signed: Wilburt Finlay 09/21/2012, 5:23 PM

## 2012-10-01 ENCOUNTER — Ambulatory Visit (HOSPITAL_COMMUNITY)
Admission: RE | Admit: 2012-10-01 | Discharge: 2012-10-01 | Disposition: A | Discharge status: Home / Self Care | Payer: Medicare Other | Source: Ambulatory Visit | Attending: Cardiovascular Disease | Admitting: Cardiovascular Disease

## 2012-10-01 DIAGNOSIS — I739 Peripheral vascular disease, unspecified: Secondary | ICD-10-CM | POA: Insufficient documentation

## 2012-10-01 HISTORY — PX: OTHER SURGICAL HISTORY: SHX169

## 2012-10-01 NOTE — Progress Notes (Signed)
Arterial Duplex Right Lower Ext. Completed. [name] [name]  

## 2012-10-05 ENCOUNTER — Encounter (HOSPITAL_BASED_OUTPATIENT_CLINIC_OR_DEPARTMENT_OTHER): Payer: Medicare Other | Attending: General Surgery

## 2012-10-05 DIAGNOSIS — E1169 Type 2 diabetes mellitus with other specified complication: Secondary | ICD-10-CM | POA: Insufficient documentation

## 2012-10-05 DIAGNOSIS — L97409 Non-pressure chronic ulcer of unspecified heel and midfoot with unspecified severity: Secondary | ICD-10-CM | POA: Insufficient documentation

## 2012-11-02 ENCOUNTER — Encounter (HOSPITAL_BASED_OUTPATIENT_CLINIC_OR_DEPARTMENT_OTHER): Payer: Medicare Other | Attending: General Surgery

## 2012-11-02 DIAGNOSIS — E1169 Type 2 diabetes mellitus with other specified complication: Secondary | ICD-10-CM | POA: Insufficient documentation

## 2012-11-02 DIAGNOSIS — L97409 Non-pressure chronic ulcer of unspecified heel and midfoot with unspecified severity: Secondary | ICD-10-CM | POA: Insufficient documentation

## 2012-12-02 ENCOUNTER — Encounter (HOSPITAL_BASED_OUTPATIENT_CLINIC_OR_DEPARTMENT_OTHER): Payer: Medicare Other | Attending: General Surgery

## 2012-12-02 DIAGNOSIS — L84 Corns and callosities: Secondary | ICD-10-CM | POA: Insufficient documentation

## 2012-12-02 DIAGNOSIS — L97409 Non-pressure chronic ulcer of unspecified heel and midfoot with unspecified severity: Secondary | ICD-10-CM | POA: Insufficient documentation

## 2012-12-02 DIAGNOSIS — E1169 Type 2 diabetes mellitus with other specified complication: Secondary | ICD-10-CM | POA: Insufficient documentation

## 2012-12-29 ENCOUNTER — Other Ambulatory Visit: Payer: Self-pay | Admitting: *Deleted

## 2012-12-29 MED ORDER — EZETIMIBE 10 MG PO TABS: 10.0000 mg | ORAL_TABLET | Freq: Every day | ORAL | Status: AC

## 2012-12-30 ENCOUNTER — Encounter (HOSPITAL_BASED_OUTPATIENT_CLINIC_OR_DEPARTMENT_OTHER): Payer: Medicare Other | Attending: General Surgery

## 2012-12-30 DIAGNOSIS — L97409 Non-pressure chronic ulcer of unspecified heel and midfoot with unspecified severity: Secondary | ICD-10-CM | POA: Insufficient documentation

## 2012-12-30 DIAGNOSIS — E1169 Type 2 diabetes mellitus with other specified complication: Secondary | ICD-10-CM | POA: Insufficient documentation

## 2013-01-04 ENCOUNTER — Other Ambulatory Visit: Payer: Self-pay | Admitting: *Deleted

## 2013-02-03 ENCOUNTER — Encounter (HOSPITAL_BASED_OUTPATIENT_CLINIC_OR_DEPARTMENT_OTHER): Payer: Medicare Other | Attending: General Surgery

## 2013-02-03 DIAGNOSIS — E1169 Type 2 diabetes mellitus with other specified complication: Secondary | ICD-10-CM | POA: Insufficient documentation

## 2013-02-03 DIAGNOSIS — L97409 Non-pressure chronic ulcer of unspecified heel and midfoot with unspecified severity: Secondary | ICD-10-CM | POA: Insufficient documentation

## 2013-03-03 ENCOUNTER — Encounter (HOSPITAL_BASED_OUTPATIENT_CLINIC_OR_DEPARTMENT_OTHER): Payer: Medicare Other | Attending: General Surgery

## 2013-03-03 DIAGNOSIS — L97409 Non-pressure chronic ulcer of unspecified heel and midfoot with unspecified severity: Secondary | ICD-10-CM | POA: Insufficient documentation

## 2013-03-03 DIAGNOSIS — E1169 Type 2 diabetes mellitus with other specified complication: Secondary | ICD-10-CM | POA: Insufficient documentation

## 2013-03-16 ENCOUNTER — Other Ambulatory Visit (HOSPITAL_COMMUNITY): Payer: Self-pay | Admitting: Cardiovascular Disease

## 2013-03-16 ENCOUNTER — Encounter (HOSPITAL_COMMUNITY): Payer: Self-pay | Admitting: *Deleted

## 2013-03-16 DIAGNOSIS — I739 Peripheral vascular disease, unspecified: Secondary | ICD-10-CM

## 2013-03-31 ENCOUNTER — Encounter (HOSPITAL_BASED_OUTPATIENT_CLINIC_OR_DEPARTMENT_OTHER): Payer: Medicare Other | Attending: General Surgery

## 2013-03-31 DIAGNOSIS — L97409 Non-pressure chronic ulcer of unspecified heel and midfoot with unspecified severity: Secondary | ICD-10-CM | POA: Insufficient documentation

## 2013-03-31 DIAGNOSIS — E1169 Type 2 diabetes mellitus with other specified complication: Secondary | ICD-10-CM | POA: Insufficient documentation

## 2013-04-01 ENCOUNTER — Ambulatory Visit (HOSPITAL_COMMUNITY)
Admission: RE | Admit: 2013-04-01 | Discharge: 2013-04-01 | Disposition: A | Discharge status: Home / Self Care | Payer: Medicare Other | Source: Ambulatory Visit | Attending: Cardiovascular Disease | Admitting: Cardiovascular Disease

## 2013-04-01 DIAGNOSIS — I70219 Atherosclerosis of native arteries of extremities with intermittent claudication, unspecified extremity: Secondary | ICD-10-CM

## 2013-04-01 DIAGNOSIS — I739 Peripheral vascular disease, unspecified: Secondary | ICD-10-CM | POA: Insufficient documentation

## 2013-04-01 NOTE — Progress Notes (Signed)
Arterial Lower Ext. Completed. Marquise Wicke, BS, RDMS, RVT  

## 2013-04-09 ENCOUNTER — Encounter: Payer: Self-pay | Admitting: *Deleted

## 2013-04-19 ENCOUNTER — Ambulatory Visit (INDEPENDENT_AMBULATORY_CARE_PROVIDER_SITE_OTHER): Payer: Medicare Other | Admitting: Cardiovascular Disease

## 2013-04-19 ENCOUNTER — Encounter: Payer: Self-pay | Admitting: Cardiovascular Disease

## 2013-04-19 VITALS — BP 138/66 | HR 80 | Ht 71.5 in | Wt 169.0 lb

## 2013-04-19 DIAGNOSIS — I2510 Atherosclerotic heart disease of native coronary artery without angina pectoris: Secondary | ICD-10-CM

## 2013-04-19 DIAGNOSIS — I999 Unspecified disorder of circulatory system: Secondary | ICD-10-CM

## 2013-04-19 DIAGNOSIS — I70229 Atherosclerosis of native arteries of extremities with rest pain, unspecified extremity: Secondary | ICD-10-CM

## 2013-04-19 DIAGNOSIS — I739 Peripheral vascular disease, unspecified: Secondary | ICD-10-CM

## 2013-04-19 DIAGNOSIS — I998 Other disorder of circulatory system: Secondary | ICD-10-CM

## 2013-04-19 NOTE — Assessment & Plan Note (Signed)
Patient is status post left external iliac artery PTA followed by staged right SFA diamondback orbital rotational atherectomy/PTCA for critical limb ischemia (nonhealing ulcers on both feet). He was a patient of Dr. Ardath Sax at [facility] wound care center released him over the last several weeks after both wounds have healed. Recent lower extremity Dopplers performed in our office 04/01/13 revealed a right ABI of 0.77 with a widely patent right SFA a left ABI 0.5 with a mild high-frequency signal in the left external iliac artery. 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

## 2013-04-19 NOTE — Patient Instructions (Signed)
  We will see you back in follow up in 1 year  Dr Allyson Sabal has ordered lower extremity arterial dopplers to be done in 6 months from now.

## 2013-04-19 NOTE — Progress Notes (Signed)
04/19/2013 [name] [name]   1929-04-18  [id number]  Primary Physician [name] Labella, MD Primary Cardiologist: [name] Gess MD [name] [name]   HPI:  The patient is a very pleasant, 79 year old, thin-appearing, married Caucasian male, father of 1 who is accompanied by his wife today. He is a patient of Dr. Daphene [name]. He has a history of ascending thoracic aortic aneurysm repair with coronary artery bypass grafting x4 by Dr. Andrey [name] August 2006. His other problems include paroxysmal A-fib, hypertension, type 2 diabetes, as well as a neurogenic bladder. He was on Coumadin remotely but this was stopped because of urologic bleeding. He had an AVM repair by Dr. Haskell [name] at [facility] in 2004 which left him numb below the waist and he is minimally ambulatory with the aid of a walker. He had a nonhealing wound on his right heel with Dopplers in our office that showed a right ABI 0.58 and a left of 0.65. I ultimately angiogram'd him initially on February 16 and angioplastied his left external iliac artery and demonstrated high-grade calcified mid right SFA. I was unable to cross its bifurcation. He had 1-vessel runoff via peroneal. I brought him back on September 15, 2012, and accessed him antegrade, performed [facility] orbital rotational atherectomy and Chocolate Balloon angioplasty with an excellent angiographic result. His followup Dopplers showed improvement in his right ABI from 0.58 to 0.67 and his left from 0.65 to 0.82. His heel ulcer is also healing as well according to Dr. Jimmey [name] and the patient. I saw him back in the office 6 months ago. Since that time wounds on both feet have subsequently healed with the help of the excellent care provided by Dr. Ardath [name] at the [facility] wound care center. His Dopplers performed earlier this month have remained stable.    Current Outpatient Prescriptions  Medication Sig Dispense Refill  . amLODipine (NORVASC) 10 MG tablet Take 10  mg by mouth daily.      Marland Kitchen aspirin 325 MG tablet Take 325 mg by mouth daily.      Marland Kitchen doxazosin (CARDURA) 2 MG tablet Take 2 mg by mouth at bedtime.      Marland Kitchen ezetimibe (ZETIA) 10 MG tablet Take 1 tablet (10 mg total) by mouth daily.  90 tablet  3  . gabapentin (NEURONTIN) 300 MG capsule Take 300 mg by mouth See admin instructions. 2 tabs every morning, 1 tab around lunch and 2 tabs at bedtime; 5 tabs total each day.      . insulin glargine (LANTUS) 100 UNIT/ML injection Inject 20 Units into the skin daily.      . metoprolol (LOPRESSOR) 50 MG tablet Take 50 mg by mouth 2 (two) times daily.      Marland Kitchen oxyCODONE-acetaminophen (PERCOCET/ROXICET) 5-325 MG per tablet Take 1 tablet by mouth See admin instructions. 2 tabs every morning, 1 tab around lunch and 2 tabs at bedtime; 5 tabs total each day.       No current facility-administered medications for this visit.    Allergies  Allergen Reactions  . Statins Other (See Comments)    Leg pains    History   Social History  . Marital Status: Married    Spouse Name: N/A    Number of Children: N/A  . Years of Education: N/A   Occupational History  . Not on file.   Social History Main Topics  . Smoking status: Former Games developer  . Smokeless tobacco: Never Used  . Alcohol Use: Not on file  .  Drug Use: No  . Sexual Activity: Yes   Other Topics Concern  . Not on file   Social History Narrative  . No narrative on file     Review of Systems: General: negative for chills, fever, night sweats or weight changes.  Cardiovascular: negative for chest pain, dyspnea on exertion, edema, orthopnea, palpitations, paroxysmal nocturnal dyspnea or shortness of breath Dermatological: negative for rash Respiratory: negative for cough or wheezing Urologic: negative for hematuria Abdominal: negative for nausea, vomiting, diarrhea, bright red blood per rectum, melena, or hematemesis Neurologic: negative for visual changes, syncope, or dizziness All other systems  reviewed and are otherwise negative except as noted above.    Blood pressure 138/66, pulse 80, height 5' 11.5" (1.816 m), weight 169 lb (76.658 kg).  General appearance: alert and no distress Neck: no adenopathy, no carotid bruit, no JVD, supple, symmetrical, trachea midline and thyroid not enlarged, symmetric, no tenderness/mass/nodules Lungs: clear to auscultation bilaterally Heart: irregularly irregular rhythm Extremities: extremities normal, atraumatic, no cyanosis or edema  EKG atrial flutter with a variable were particular response of approximately 70  ASSESSMENT AND PLAN:   Critical lower limb ischemia - Right-sided non-healing foot ulcer Patient is status post left external iliac artery PTA followed by staged right SFA diamondback orbital rotational atherectomy/PTCA for critical limb ischemia (nonhealing ulcers on both feet). He was a patient of Dr. Ardath [name] at [facility] wound care center released him over the last several weeks after both wounds have healed. Recent lower extremity Dopplers performed in our office 04/01/13 revealed a right ABI of 0.77 with a widely patent right SFA a left ABI 0.5 with a mild high-frequency signal in the left external iliac artery. 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      [name] Gess MD FACP,FACC,FAHA, FSCAI 04/19/2013 2:00 PM

## 2013-04-30 ENCOUNTER — Ambulatory Visit (INDEPENDENT_AMBULATORY_CARE_PROVIDER_SITE_OTHER): Payer: Medicare Other | Admitting: Cardiovascular Disease

## 2013-04-30 ENCOUNTER — Encounter: Payer: Self-pay | Admitting: Cardiovascular Disease

## 2013-04-30 VITALS — BP 136/80 | HR 70 | Ht 71.5 in | Wt 176.9 lb

## 2013-04-30 DIAGNOSIS — R609 Edema, unspecified: Secondary | ICD-10-CM

## 2013-04-30 DIAGNOSIS — I2510 Atherosclerotic heart disease of native coronary artery without angina pectoris: Secondary | ICD-10-CM

## 2013-04-30 DIAGNOSIS — N319 Neuromuscular dysfunction of bladder, unspecified: Secondary | ICD-10-CM

## 2013-04-30 DIAGNOSIS — I739 Peripheral vascular disease, unspecified: Secondary | ICD-10-CM

## 2013-04-30 DIAGNOSIS — R600 Localized edema: Secondary | ICD-10-CM

## 2013-04-30 DIAGNOSIS — I480 Paroxysmal atrial fibrillation: Secondary | ICD-10-CM

## 2013-04-30 DIAGNOSIS — E119 Type 2 diabetes mellitus without complications: Secondary | ICD-10-CM

## 2013-04-30 DIAGNOSIS — I4891 Unspecified atrial fibrillation: Secondary | ICD-10-CM

## 2013-04-30 DIAGNOSIS — Z8679 Personal history of other diseases of the circulatory system: Secondary | ICD-10-CM

## 2013-04-30 DIAGNOSIS — I10 Essential (primary) hypertension: Secondary | ICD-10-CM

## 2013-04-30 NOTE — Patient Instructions (Signed)
Your physician has recommended you make the following change in your medication: 6 month  Your physician has recommended you make the following change in your medication: Increase Doxazosin to 3 mg daily and Decrease Amlodipine to 5 mg daily Take Metoprolol 75 mg (1 1/2) tablets in the morning and 50 mg (1) tablet at night.

## 2013-05-01 ENCOUNTER — Encounter: Payer: Self-pay | Admitting: Cardiovascular Disease

## 2013-05-01 DIAGNOSIS — Z8679 Personal history of other diseases of the circulatory system: Secondary | ICD-10-CM | POA: Insufficient documentation

## 2013-05-01 DIAGNOSIS — R600 Localized edema: Secondary | ICD-10-CM | POA: Insufficient documentation

## 2013-05-01 NOTE — Progress Notes (Signed)
Patient ID: [name] [name], male   DOB: 1928/12/10, 79 y.o.   MRN: [id number]     HPI: [name] [name] is a 79 y.o. male who presents for cardiology followup evaluation.   [name] [name] is an 79 year old gentleman who in March 2004 presented to to [facility] and was found to be in cardiac tamponade as a result of an acute descending aortic dissection. He underwent emergent pericardiocentesis and surgical repair with grafting of his descending aorta. In August 2006 he underwent CABG surgery x4 by Dr. Dorris Fetch. He has a history of paroxysmal atrial fibrillation, neurogenic bladder, hypertension, insulin-dependent diabetes mellitus. In the past, while on Coumadin he did develop significant difficulty leading to multiple episodes of urologic bleeding. He had an AVM repair by Dr. Zachery Conch at [facility] in 2004 which left him with paresthesias and numbness below the waist such that he has significant impairment in mobility and walks with a walker. He has had development of peripheral vascular disease and had a nonhealing wound on his right heel with Dopplers with Doppler suggesting severe peripheral vascular disease with ABIs of 0.58 on the right and 0.65 on the left. He is status post peripheral intervention of his left external iliac artery and underwent staged diamondback portable rotational atherectomy of a calcified mid right SFA by Dr. Allyson Sabal. He does admit to leg swelling. He denies recent chest pain. He last saw Dr. Allyson Sabal several weeks ago. Recent lower survey Doppler showed a right ABI of 0.77 with a widely patent right SFA and a left ABI of 0.5 with mild high-frequency signal in the left external iliac artery. His pedal wounds have healed. He presents for evaluation  Past Medical History  Diagnosis Date  . Peripheral vascular disease     critical limb ischemia  . Myocardial infarction   . Diabetes mellitus without complication   . Hypertension   . Coronary artery disease   . Atrial fibrillation    . Aortic dissection     w/ Cardiac tamponade  . AVM (arteriovenous malformation)   . Murmur   . Hyperlipidemia   . Neuropathy of left lower extremity   . S/P CABG (coronary artery bypass graft)     Past Surgical History  Procedure Laterality Date  . Coronary artery bypass graft  02/11/2005    LIMA to LAD, SVG to 1st diag, SVG to OM1, and SVG to PD  . Back surgery    . Atherectomy  09/15/2012    Diamondback orbital rotation atherectomy was performed using a 2 mm solid crown up to a max of 120,000 RPM. Angioplasty was performed using a 5x169mm long chocolate balloon for 2 minute inflations. Resulting in reduction of an 80% calcified distal R SFA stenosis to less than 20%  . Lea doppler  10/01/2012    R SFA demonstrated a mild amount of residual plaque suggesting less then 50% diametere reduction. R Calf runoff-one vessel runoff via peroneal artery. The posterior and anterior tibial arteries appeared occluded.  . Cardiac catheterization  02/04/2005    Severe 3-vessel disease affecting LAD and RCA. Consider CABG  . Cardiovascular stress test  12/16/2006    EKG negative for ischemia. No ECG changes. No significant ischemia demonstrated  . Transthoracic echocardiogram  12/04/2011    EF 50-55%, moderate aortic root dilation    Allergies  Allergen Reactions  . Statins Other (See Comments)    Leg pains    Current Outpatient Prescriptions  Medication Sig Dispense Refill  . amLODipine (NORVASC) 10 MG  tablet Take 5 mg by mouth daily.       Marland Kitchen aspirin 325 MG tablet Take 325 mg by mouth daily.      Marland Kitchen doxazosin (CARDURA) 2 MG tablet Take 3 mg by mouth at bedtime.       Marland Kitchen ezetimibe (ZETIA) 10 MG tablet Take 1 tablet (10 mg total) by mouth daily.  90 tablet  3  . gabapentin (NEURONTIN) 300 MG capsule Take 300 mg by mouth See admin instructions. 2 tabs every morning, 1 tab around lunch and 2 tabs at bedtime; 5 tabs total each day.      . insulin glargine (LANTUS) 100 UNIT/ML injection Inject 20 Units  into the skin daily.      . metoprolol (LOPRESSOR) 50 MG tablet Take 75 mg in the morning and 50 mg at night      . oxyCODONE-acetaminophen (PERCOCET/ROXICET) 5-325 MG per tablet Take 1 tablet by mouth See admin instructions. 2 tabs every morning, 1 tab around lunch and 2 tabs at bedtime; 5 tabs total each day.       No current facility-administered medications for this visit.    History   Social History  . Marital Status: Married    Spouse Name: N/A    Number of Children: N/A  . Years of Education: N/A   Occupational History  . Not on file.   Social History Main Topics  . Smoking status: Former Games developer  . Smokeless tobacco: Never Used  . Alcohol Use: Not on file  . Drug Use: No  . Sexual Activity: Yes   Other Topics Concern  . Not on file   Social History Narrative  . No narrative on file   Social history is notable in that he is married. He is unable to exercise. There is a remote tobacco history but he quit in 2012.    History reviewed. No pertinent family history.  ROS is negative for fevers, chills or night sweats. He denies recent visual changes. He denies cough or sputum production. He denies presyncope or syncope. He denies recurrent chest pain. He denies orthopnea or PND. He denies abdominal pain. He denies GI symptoms. He does have to straight catheterize himself. He does have neuropathy. He did have difficulty he'll ulcers which have improved following his PVD procedures. He does have hyperlipidemia. He does have hypertension. He denies rash.  Other comprehensive 12 point system review is negative.  PE BP 136/80  Pulse 70  Ht 5' 11.5" (1.816 m)  Wt 176 lb 14.4 oz (80.241 kg)  BMI 24.33 kg/m2  General: Alert, oriented, no distress.  Skin: normal turgor, no rashes HEENT: Normocephalic, atraumatic. Pupils round and reactive; sclera anicteric;no lid lag.  Nose without nasal septal hypertrophy Mouth/Parynx benign; Mallinpatti scale 3 Neck: No JVD, no carotid  briuts Lungs: clear to ausculatation and percussion; no wheezing or rales Heart: RRR, s1 s2 normal 1/6 systolic murmur. Abdomen: soft, nontender; no hepatosplenomehaly, BS+; abdominal aorta nontender and not dilated by palpation. Pulses 2+ Extremities: Trace to 1+ lower extremity edema. no clubbing cyanosis or edema, Homan's sign negative  Neurologic: grossly nonfocal Psychologic: normal affect and mood.  ECG: Sinus rhythm with PACs. Left axis deviation. Artifact in several leads.  LABS:  BMET    Component Value Date/Time   NA 141 09/16/2012 0425   K 4.7 09/16/2012 0425   CL 109 09/16/2012 0425   CO2 23 09/16/2012 0425   GLUCOSE 156* 09/16/2012 0425   BUN 38* 09/16/2012 0425  CREATININE 1.54* 09/16/2012 0425   CALCIUM 8.8 09/16/2012 0425   GFRNONAA 41* 09/16/2012 0425   GFRAA 47* 09/16/2012 0425     Hepatic Function Panel  No results found for this basename: prot, albumin, ast, alt, alkphos, bilitot, bilidir, ibili     CBC    Component Value Date/Time   WBC 6.0 09/16/2012 0425   RBC 3.52* 09/16/2012 0425   HGB 10.8* 09/16/2012 0425   HCT 32.3* 09/16/2012 0425   PLT 151 09/16/2012 0425   MCV 91.8 09/16/2012 0425   MCH 30.7 09/16/2012 0425   MCHC 33.4 09/16/2012 0425   RDW 13.8 09/16/2012 0425     BNP No results found for this basename: probnp    Lipid Panel  No results found for this basename: chol, trig, hdl, cholhdl, vldl, ldlcalc     RADIOLOGY: No results found.    ASSESSMENT AND PLAN: [name] [name] is now 10 years status post his presentation and cardiac And not due to a spontaneous descending aorta dissection requiring emergent per centesis and surgical repair and grafting of his descending aorta. He is 8 years status post CABG surgery. He is status post recent PVD intervention by Dr. Allyson Sabal earlier this year.  He does have leg swelling. Blood pressure today was 140/80. He has been on amlodipine 10 mg which undoubtedly is contributing to some of his lower extremity  edema. I will try to reduce his amlodipine to 5 mg. I will titrate his doxazosin from 2 mg to 3 mg. I'm also further titrating his metoprolol tartrate from 50 twice a day to 75 mg in the morning and 50 mg at night. I have suggested compression support stockings on his lower extremities. I will see him in 6 months for cardiology reevaluation.     Lennette Bihari, MD, [facility]  05/01/2013 10:42 AM

## 2013-05-04 ENCOUNTER — Encounter: Payer: Self-pay | Admitting: Cardiovascular Disease

## 2013-06-10 ENCOUNTER — Other Ambulatory Visit: Payer: Self-pay | Admitting: Gastroenterology

## 2013-06-10 DIAGNOSIS — Z139 Encounter for screening, unspecified: Secondary | ICD-10-CM

## 2013-09-30 ENCOUNTER — Encounter (HOSPITAL_COMMUNITY): Payer: Self-pay | Admitting: *Deleted

## 2013-10-20 ENCOUNTER — Ambulatory Visit (HOSPITAL_COMMUNITY)
Admission: RE | Admit: 2013-10-20 | Discharge: 2013-10-20 | Disposition: A | Discharge status: Home / Self Care | Payer: Medicare Other | Source: Ambulatory Visit | Attending: Cardiology | Admitting: Cardiology

## 2013-10-20 DIAGNOSIS — I739 Peripheral vascular disease, unspecified: Secondary | ICD-10-CM

## 2013-10-20 NOTE — Progress Notes (Signed)
Lower Extremity Arterial Duplex Completed. °Brianna L Mazza,RVT °

## 2013-10-28 ENCOUNTER — Ambulatory Visit (INDEPENDENT_AMBULATORY_CARE_PROVIDER_SITE_OTHER): Payer: Medicare Other | Admitting: Cardiovascular Disease

## 2013-10-28 ENCOUNTER — Encounter: Payer: Self-pay | Admitting: Cardiovascular Disease

## 2013-10-28 VITALS — BP 152/80 | HR 79 | Ht 71.5 in | Wt 169.8 lb

## 2013-10-28 DIAGNOSIS — E119 Type 2 diabetes mellitus without complications: Secondary | ICD-10-CM

## 2013-10-28 DIAGNOSIS — I2510 Atherosclerotic heart disease of native coronary artery without angina pectoris: Secondary | ICD-10-CM

## 2013-10-28 DIAGNOSIS — N183 Chronic kidney disease, stage 3 unspecified: Secondary | ICD-10-CM

## 2013-10-28 DIAGNOSIS — I10 Essential (primary) hypertension: Secondary | ICD-10-CM

## 2013-10-28 DIAGNOSIS — I4891 Unspecified atrial fibrillation: Secondary | ICD-10-CM | POA: Insufficient documentation

## 2013-10-28 DIAGNOSIS — Z8679 Personal history of other diseases of the circulatory system: Secondary | ICD-10-CM

## 2013-10-28 DIAGNOSIS — N2889 Other specified disorders of kidney and ureter: Secondary | ICD-10-CM

## 2013-10-28 DIAGNOSIS — N319 Neuromuscular dysfunction of bladder, unspecified: Secondary | ICD-10-CM

## 2013-10-28 MED ORDER — METOPROLOL TARTRATE 50 MG PO TABS: ORAL_TABLET | ORAL | Status: DC

## 2013-10-28 MED ORDER — APIXABAN 2.5 MG PO TABS: 2.5000 mg | ORAL_TABLET | Freq: Two times a day (BID) | ORAL | Status: DC

## 2013-10-28 NOTE — Patient Instructions (Signed)
Your physician has requested that you have an echocardiogram. Echocardiography is a painless test that uses sound waves to create images of your heart. It provides your doctor with information about the size and shape of your heart and how well your heart's chambers and valves are working. This procedure takes approximately one hour. There are no restrictions for this procedure.  Your physician has recommended you make the following change in your medication: increase the metoprolol tart to 75 mg twice daily. ( 1 & 1/2 tablet) Start new prescription for Eliquis 2.5 mg these prescriptions has been sent to the pharmacy. . Your physician recommends that you return for lab work fasting.  Your physician recommends that you schedule a follow-up appointment in: 3-4 weeks.

## 2013-10-28 NOTE — Progress Notes (Signed)
Patient ID: [name] [name], male   DOB: 1929/05/20, 80 y.o.   MRN: [id number]     HPI: [name] [name] is a 80 y.o. male who presents for a 6 month cardiology followup evaluation.   [name] [name] is an 80 year old gentleman who presented to to [facility] in March 2004 and was found to be in cardiac tamponade as a result of an acute ascending aortic dissection. He underwent emergent pericardiocentesis and surgical repair with grafting of his ascending aorta. In August 2006 he underwent CABG surgery x4 by Dr. Dorris Fetch. He has a history of paroxysmal atrial fibrillation, neurogenic bladder, hypertension, insulin-dependent diabetes mellitus. In the past, while on Coumadin he did develop significant difficulty leading to multiple episodes of urologic bleeding. He had an AVM repair by Dr. Zachery Conch at [facility] in 2004 which left him with paresthesias and numbness below the waist such that he has significant impairment in mobility and walks with a walker. He has had development of peripheral vascular disease and had a nonhealing wound on his right heel with Dopplers with Doppler suggesting severe peripheral vascular disease with ABIs of 0.58 on the right and 0.65 on the left. He is status post peripheral intervention of his left external iliac artery and underwent staged diamondback portable rotational atherectomy of a calcified mid right SFA by Dr. Allyson Sabal.   Since I last saw him, he denies any recurrent episodes of chest tightness.  According to his wife, he had a low-grade fever several days ago.  He does note mild shortness of breath with activity.  He has not had any recent bleeding.  He is unaware of any rhythm abnormality.  He denies presyncope or syncope.  He self caths himself 3 times per day for his neurogenic bladder.  Past Medical History  Diagnosis Date  . Peripheral vascular disease     critical limb ischemia  . Myocardial infarction   . Diabetes mellitus without complication   . Hypertension    . Coronary artery disease   . Atrial fibrillation   . Aortic dissection     w/ Cardiac tamponade  . AVM (arteriovenous malformation)   . Murmur   . Hyperlipidemia   . Neuropathy of left lower extremity   . S/P CABG (coronary artery bypass graft)     Past Surgical History  Procedure Laterality Date  . Coronary artery bypass graft  02/11/2005    LIMA to LAD, SVG to 1st diag, SVG to OM1, and SVG to PD  . Back surgery    . Atherectomy  09/15/2012    Diamondback orbital rotation atherectomy was performed using a 2 mm solid crown up to a max of 120,000 RPM. Angioplasty was performed using a 5x128mm long chocolate balloon for 2 minute inflations. Resulting in reduction of an 80% calcified distal R SFA stenosis to less than 20%  . Lea doppler  10/01/2012    R SFA demonstrated a mild amount of residual plaque suggesting less then 50% diametere reduction. R Calf runoff-one vessel runoff via peroneal artery. The posterior and anterior tibial arteries appeared occluded.  . Cardiac catheterization  02/04/2005    Severe 3-vessel disease affecting LAD and RCA. Consider CABG  . Cardiovascular stress test  12/16/2006    EKG negative for ischemia. No ECG changes. No significant ischemia demonstrated  . Transthoracic echocardiogram  12/04/2011    EF 50-55%, moderate aortic root dilation    Allergies  Allergen Reactions  . Statins Other (See Comments)    Leg pains  Current Outpatient Prescriptions  Medication Sig Dispense Refill  . amLODipine (NORVASC) 5 MG tablet 5 mg. Takes 1/2 tablet daily      . aspirin 81 MG tablet Take 81 mg by mouth daily.      Marland Kitchen. doxazosin (CARDURA) 2 MG tablet Take 3 mg by mouth at bedtime.       Marland Kitchen. ezetimibe (ZETIA) 10 MG tablet Take 1 tablet (10 mg total) by mouth daily.  90 tablet  3  . gabapentin (NEURONTIN) 300 MG capsule Take 300 mg by mouth See admin instructions. 2 tabs every morning, 1 tab around lunch and 2 tabs at bedtime; 5 tabs total each day.      . insulin  glargine (LANTUS) 100 UNIT/ML injection Inject 20 Units into the skin daily.      Marland Kitchen. oxyCODONE-acetaminophen (PERCOCET/ROXICET) 5-325 MG per tablet Take 1 tablet by mouth See admin instructions. 2 tabs every morning, 1 tab around lunch and 2 tabs at bedtime; 5 tabs total each day.      Marland Kitchen. apixaban (ELIQUIS) 2.5 MG TABS tablet Take 1 tablet (2.5 mg total) by mouth 2 (two) times daily.  60 tablet  11  . metoprolol (LOPRESSOR) 50 MG tablet Take 1.5 tablets twice daily  90 tablet  11   No current facility-administered medications for this visit.    History   Social History  . Marital Status: Married    Spouse Name: N/A    Number of Children: N/A  . Years of Education: N/A   Occupational History  . Not on file.   Social History Main Topics  . Smoking status: Former Games developermoker  . Smokeless tobacco: Never Used  . Alcohol Use: Not on file  . Drug Use: No  . Sexual Activity: Yes   Other Topics Concern  . Not on file   Social History Narrative  . No narrative on file   Social history is notable in that he is married. He is unable to exercise. There is a remote tobacco history but he quit in 2012.    History reviewed. No pertinent family history.  ROS is positive for recent mild fever, and diaphoresis. He denies recent visual changes.  He denies change in hearing He denies cough or sputum production. He denies presyncope or syncope. He denies recurrent chest pain.  He is unaware of any palpitations. He denies orthopnea or PND. He denies abdominal pain. He denies GI symptoms. He does have to straight catheterize himself. He does have neuropathy. He did have difficulty with ulcers which have improved following his PVD procedures. He does have hyperlipidemia. He does have hypertension. He denies rash.  There is a history of type 2 diabetes.  He denies difficulty with sleep. Other comprehensive 14 point system review is negative.  PE BP 152/80  Pulse 79  Ht 5' 11.5" (1.816 m)  Wt 169 lb 12.8 oz  (77.021 kg)  BMI 23.35 kg/m2  General: Alert, oriented, no distress.  Skin: normal turgor, no rashes HEENT: Normocephalic, atraumatic. Pupils round and reactive; sclera anicteric;no lid lag.  Nose without nasal septal hypertrophy Mouth/Parynx benign; Mallinpatti scale 3 Neck: No JVD, no carotid briuts Lungs: clear to ausculatation and percussion; no wheezing or rales Heart: Irregular irregular rhythm with a ventricular rate in the 70s, s1 s2 normal 1/6 systolic murmur.  No diastolic murmur. Abdomen: soft, nontender; no hepatosplenomehaly, BS+; abdominal aorta nontender and not dilated by palpation. Pulses 2+ Extremities: Trace  lower extremity edema. no clubbing cyanosis or edema, Homan's  sign negative  Neurologic: grossly nonfocal Psychologic: normal affect and mood.  ECG: Atrial fibrillation with coarse fibrillatory/flutter waves with a ventricular rate 79  Prior 04/30/2013 ECG: Sinus rhythm with PACs. Left axis deviation. Artifact in several leads.  LABS:  BMET    Component Value Date/Time   NA 141 09/16/2012 0425   K 4.7 09/16/2012 0425   CL 109 09/16/2012 0425   CO2 23 09/16/2012 0425   GLUCOSE 156* 09/16/2012 0425   BUN 38* 09/16/2012 0425   CREATININE 1.54* 09/16/2012 0425   CALCIUM 8.8 09/16/2012 0425   GFRNONAA 41* 09/16/2012 0425   GFRAA 47* 09/16/2012 0425     Hepatic Function Panel  No results found for this basename: prot,  albumin,  ast,  alt,  alkphos,  bilitot,  bilidir,  ibili     CBC    Component Value Date/Time   WBC 6.0 09/16/2012 0425   RBC 3.52* 09/16/2012 0425   HGB 10.8* 09/16/2012 0425   HCT 32.3* 09/16/2012 0425   PLT 151 09/16/2012 0425   MCV 91.8 09/16/2012 0425   MCH 30.7 09/16/2012 0425   MCHC 33.4 09/16/2012 0425   RDW 13.8 09/16/2012 0425     BNP No results found for this basename: probnp    Lipid Panel  No results found for this basename: chol,  trig,  hdl,  cholhdl,  vldl,  ldlcalc     RADIOLOGY: No results found.    ASSESSMENT  AND PLAN: [name] [name] is 11 years status post his presentation with cardiac tamponade  due to a spontaneous asscending aorta dissection requiring emergent pericadiocentesis and surgical repair and grafting of his ascending aorta. He is 9 years status post CABG surgery. He is status post  PVD intervention by Dr. Levan HurstBerry lastyear.  I last saw him, he did have leg swelling, and I reduced his, amlodipine.  We'll further titrate his metoprolol tartrate supposedly to 75 mg in the morning and 50 mg at night.  He recalls reducing his M. low-paying dose, but he never did increase the metoprolol dose.  His ECG today confirms that he is in atrial fibrillation of questionable duration.  He is unaware of this rhythm.  Along discussion with he and his wife.  Does potential bleeding risk versus stroke.  Rest of whether or not to reinitiate a trial of low-dose anticoagulation therapy.  After a discussion, we will try initiating low dose eliquis at 2.5 mg twice a day.  He does have mild renal insufficiency and his last creatinine last year was 1.54.  I have recommended he titrate his metoprolol tartrate to 75 mg twice a day.  I'm scheduling him for a followup echo Doppler study.  Comprehensive blood work will be obtained, including chemistry, magnesium, TSH, CBC, and lipid studies.  I will see him in 3-4 weeks for followup evaluation.     Lennette Biharihomas A. Dorrie Cocuzza, MD, [facility]  10/28/2013 8:05 PM

## 2013-11-02 ENCOUNTER — Telehealth: Payer: Self-pay | Admitting: *Deleted

## 2013-11-02 ENCOUNTER — Ambulatory Visit (HOSPITAL_COMMUNITY)
Admission: RE | Admit: 2013-11-02 | Discharge: 2013-11-02 | Disposition: A | Discharge status: Home / Self Care | Payer: Medicare Other | Source: Ambulatory Visit | Attending: Cardiovascular Disease | Admitting: Cardiovascular Disease

## 2013-11-02 DIAGNOSIS — I2510 Atherosclerotic heart disease of native coronary artery without angina pectoris: Secondary | ICD-10-CM | POA: Insufficient documentation

## 2013-11-02 DIAGNOSIS — I4891 Unspecified atrial fibrillation: Secondary | ICD-10-CM | POA: Insufficient documentation

## 2013-11-02 NOTE — Progress Notes (Signed)
2D Echo Performed 11/02/2013    Shoichi Mielke, RCS  

## 2013-11-02 NOTE — Telephone Encounter (Signed)
Faxed PA for eliquis 2.5mg  bid to [facility]

## 2013-11-04 LAB — CBC
HCT: 38.4 % — ABNORMAL LOW (ref 39.0–52.0)
Hemoglobin: 12.7 g/dL — ABNORMAL LOW (ref 13.0–17.0)
MCH: 30.8 pg (ref 26.0–34.0)
MCHC: 33.1 g/dL (ref 30.0–36.0)
MCV: 93 fL (ref 78.0–100.0)
PLATELETS: 200 10*3/uL (ref 150–400)
RBC: 4.13 MIL/uL — AB (ref 4.22–5.81)
RDW: 14.9 % (ref 11.5–15.5)
WBC: 9.2 10*3/uL (ref 4.0–10.5)

## 2013-11-04 LAB — COMPREHENSIVE METABOLIC PANEL
ALK PHOS: 62 U/L (ref 39–117)
ALT: 11 U/L (ref 0–53)
AST: 13 U/L (ref 0–37)
Albumin: 3.8 g/dL (ref 3.5–5.2)
BILIRUBIN TOTAL: 0.6 mg/dL (ref 0.2–1.2)
BUN: 28 mg/dL — ABNORMAL HIGH (ref 6–23)
CO2: 24 meq/L (ref 19–32)
Calcium: 8.8 mg/dL (ref 8.4–10.5)
Chloride: 106 mEq/L (ref 96–112)
Creat: 1.4 mg/dL — ABNORMAL HIGH (ref 0.50–1.35)
Glucose, Bld: 92 mg/dL (ref 70–99)
POTASSIUM: 4.3 meq/L (ref 3.5–5.3)
SODIUM: 140 meq/L (ref 135–145)
TOTAL PROTEIN: 7.2 g/dL (ref 6.0–8.3)

## 2013-11-04 LAB — LIPID PANEL
CHOL/HDL RATIO: 3.4 ratio
Cholesterol: 182 mg/dL (ref 0–200)
HDL: 54 mg/dL (ref >39)
LDL CALC: 111 mg/dL — AB (ref 0–99)
Triglycerides: 85 mg/dL (ref <150)
VLDL: 17 mg/dL (ref 0–40)

## 2013-11-04 LAB — HEMOGLOBIN A1C
HEMOGLOBIN A1C: 7 % — AB (ref <5.7)
Mean Plasma Glucose: 154 mg/dL — ABNORMAL HIGH (ref <117)

## 2013-11-04 LAB — MAGNESIUM: Magnesium: 1.9 mg/dL (ref 1.5–2.5)

## 2013-11-04 LAB — TSH: TSH: 2.468 u[IU]/mL (ref 0.350–4.500)

## 2013-11-08 ENCOUNTER — Telehealth: Payer: Self-pay | Admitting: *Deleted

## 2013-11-08 NOTE — Telephone Encounter (Signed)
Opened in error

## 2013-11-10 NOTE — Addendum Note (Signed)
Addended byGaynelle Cage: WADDELL, WANDA M. on: 11/10/2013 12:12 PM   Modules accepted: Orders

## 2013-11-12 ENCOUNTER — Telehealth: Payer: Self-pay | Admitting: *Deleted

## 2013-11-12 NOTE — Telephone Encounter (Signed)
Message copied by Gaynelle CageWADDELL, Axyl Sitzman M. on Fri Nov 12, 2013  5:37 PM ------      Message from: Nicki GuadalajaraKELLY, Raji A      Created: Mon Nov 08, 2013  7:50 AM       H/h better;renal fxn better; Hb A1c 7; lipids ok but goal is LDL <70 if possible ------

## 2013-11-12 NOTE — Telephone Encounter (Signed)
Informed patient of lab and echo results.

## 2013-11-30 ENCOUNTER — Other Ambulatory Visit: Payer: Self-pay

## 2013-11-30 MED ORDER — DOXAZOSIN MESYLATE 2 MG PO TABS: 3.0000 mg | ORAL_TABLET | Freq: Every day | ORAL | Status: DC

## 2013-11-30 NOTE — Telephone Encounter (Signed)
Rx was sent to pharmacy electronically. 

## 2013-12-16 ENCOUNTER — Ambulatory Visit (INDEPENDENT_AMBULATORY_CARE_PROVIDER_SITE_OTHER): Payer: Medicare Other | Admitting: Cardiovascular Disease

## 2013-12-16 VITALS — BP 152/76 | HR 77 | Ht 71.0 in | Wt 170.2 lb

## 2013-12-16 DIAGNOSIS — Z7901 Long term (current) use of anticoagulants: Secondary | ICD-10-CM

## 2013-12-16 DIAGNOSIS — N319 Neuromuscular dysfunction of bladder, unspecified: Secondary | ICD-10-CM

## 2013-12-16 DIAGNOSIS — E785 Hyperlipidemia, unspecified: Secondary | ICD-10-CM

## 2013-12-16 DIAGNOSIS — I2510 Atherosclerotic heart disease of native coronary artery without angina pectoris: Secondary | ICD-10-CM

## 2013-12-16 DIAGNOSIS — I2584 Coronary atherosclerosis due to calcified coronary lesion: Secondary | ICD-10-CM

## 2013-12-16 DIAGNOSIS — I4891 Unspecified atrial fibrillation: Secondary | ICD-10-CM

## 2013-12-16 DIAGNOSIS — I480 Paroxysmal atrial fibrillation: Secondary | ICD-10-CM

## 2013-12-16 MED ORDER — AMIODARONE HCL 200 MG PO TABS: 200.0000 mg | ORAL_TABLET | Freq: Every day | ORAL | Status: DC

## 2013-12-16 NOTE — Patient Instructions (Addendum)
Your physician has recommended you make the following change in your medication: start new prescription for amiodarone 200 mg daily. This has already been sent to the pharmacy.  2 days after beginning the amiodarone decrease the metoprolol ( lopressor) to 50 mg twice daily.  Your physician recommends that you return for lab work in: 2 weeks.  Your physician recommends that you schedule a follow-up appointment in: 4-6 weeks.

## 2013-12-17 ENCOUNTER — Encounter: Payer: Self-pay | Admitting: Cardiovascular Disease

## 2013-12-17 DIAGNOSIS — Z7901 Long term (current) use of anticoagulants: Secondary | ICD-10-CM | POA: Insufficient documentation

## 2013-12-17 NOTE — Progress Notes (Signed)
**Note De-Identified  Obfuscation** Patient ID: [name] [name], male   DOB: 05/23/1929, 80 y.o.   MRN: [id number]     HPI: [name] [name] is a 80 y.o. male who presents for a 2 month cardiology followup evaluation.   [name] [name] is an 80 year old gentleman who presented to to [facility] in March 2004 and was found to be in cardiac tamponade as a result of an acute ascending aortic dissection. He underwent emergent pericardiocentesis and surgical repair with grafting of his ascending aorta. In August 2006 he underwent CABG surgery x4 by Dr. Dorris FetchHendrickson. He has a history of paroxysmal atrial fibrillation, neurogenic bladder, hypertension, insulin-dependent diabetes mellitus. In the past, while on Coumadin he did develop significant difficulty leading to multiple episodes of urologic bleeding. He had an AVM repair by [facility] Zachery ConchFriedman at [facility] in [facility] which left him with paresthesias and numbness below the waist such that he has significant impairment in mobility and walks with a walker. He has had development of peripheral vascular disease and had a nonhealing wound on his right heel with Dopplers with Doppler suggesting severe peripheral vascular disease with ABIs of 0.58 on the right and 0.65 on the left. He is status post peripheral intervention of his left external iliac artery and underwent staged diamondback portable rotational atherectomy of a calcified mid right SFA by Dr. Allyson SabalBerry.   When I saw him in October 2014 he was in sinus rhythm.  When last seen on 10/28/2013, his ECG revealed atrial fibrillation with a ventricular rate of 79 beats per minute.  He was unaware of when he had what AF.  Remotely, he had developed atrial fibrillation following his CABG revascularization surgery.  2 months ago, I further titrate his metoprolol tartrate to 75 mg twice a day.  An echo Doppler study was done on 11/02/2013, which showed an ejection fraction of 55-60%.  There was tril aortic insufficiency.  Left atrial dimension was felt to be upper  normal in size.  He is on a request to 0.5 mg twice a day for anticoagulation.  He has a history of a neurogenic bladder and has to self catheterize himself daily.  In the past.  He has had issues with bleeding during self-catheterization while he had been on Coumadin therapy.  He presents now for evaluation.   Past Medical History  Diagnosis Date  . Peripheral vascular disease     critical limb ischemia  . Myocardial infarction   . Diabetes mellitus without complication   . Hypertension   . Coronary artery disease   . Atrial fibrillation   . Aortic dissection     w/ Cardiac tamponade  . AVM (arteriovenous malformation)   . Murmur   . Hyperlipidemia   . Neuropathy of left lower extremity   . S/P CABG (coronary artery bypass graft)     Past Surgical History  Procedure Laterality Date  . Coronary artery bypass graft  02/11/2005    LIMA to LAD, SVG to 1st diag, SVG to OM1, and SVG to PD  . Back surgery    . Atherectomy  09/15/2012    Diamondback orbital rotation atherectomy was performed using a 2 mm solid crown up to a max of 120,000 RPM. Angioplasty was performed using a 5x18520mm long chocolate balloon for 2 minute inflations. Resulting in reduction of an 80% calcified distal R SFA stenosis to less than 20%  . Lea doppler  10/01/2012    R SFA demonstrated a mild amount of residual plaque suggesting less then 50% diametere **Note De-Identified Yazmyn Valbuena Obfuscation** reduction. R Calf runoff-one vessel runoff Yadir Zentner peroneal artery. The posterior and anterior tibial arteries appeared occluded.  . Cardiac catheterization  02/04/2005    Severe 3-vessel disease affecting LAD and RCA. Consider CABG  . Cardiovascular stress test  12/16/2006    EKG negative for ischemia. No ECG changes. No significant ischemia demonstrated  . Transthoracic echocardiogram  12/04/2011    EF 50-55%, moderate aortic root dilation    Allergies  Allergen Reactions  . Statins Other (See Comments)    Leg pains    Current Outpatient Prescriptions  Medication Sig  Dispense Refill  . amLODipine (NORVASC) 5 MG tablet 5 mg. Takes 1/2 tablet daily      . apixaban (ELIQUIS) 2.5 MG TABS tablet Take 1 tablet (2.5 mg total) by mouth 2 (two) times daily.  60 tablet  11  . aspirin 81 MG tablet Take 81 mg by mouth daily.      Marland Kitchen doxazosin (CARDURA) 2 MG tablet Take 1.5 tablets (3 mg total) by mouth at bedtime.  135 tablet  3  . DULoxetine (CYMBALTA) 20 MG capsule Take 20 mg by mouth daily.      Marland Kitchen ezetimibe (ZETIA) 10 MG tablet Take 1 tablet (10 mg total) by mouth daily.  90 tablet  3  . gabapentin (NEURONTIN) 300 MG capsule Take 300 mg by mouth See admin instructions. 2 tabs every morning, 1 tab around lunch and 2 tabs at bedtime; 5 tabs total each day.      . insulin glargine (LANTUS) 100 UNIT/ML injection Inject 20 Units into the skin daily.      . metoprolol (LOPRESSOR) 50 MG tablet Take 1.5 tablets twice daily  90 tablet  11  . oxyCODONE-acetaminophen (PERCOCET/ROXICET) 5-325 MG per tablet Take 1 tablet by mouth See admin instructions. 2 tabs every morning, 1 tab around lunch and 2 tabs at bedtime; 5 tabs total each day.      Marland Kitchen amiodarone (PACERONE) 200 MG tablet Take 1 tablet (200 mg total) by mouth daily.  30 tablet  6   No current facility-administered medications for this visit.    History   Social History  . Marital Status: Married    Spouse Name: N/A    Number of Children: N/A  . Years of Education: N/A   Occupational History  . Not on file.   Social History Main Topics  . Smoking status: Former Games developer  . Smokeless tobacco: Never Used  . Alcohol Use: Not on file  . Drug Use: No  . Sexual Activity: Yes   Other Topics Concern  . Not on file   Social History Narrative  . No narrative on file   Social history is notable in that he is married. He is unable to exercise. There is a remote tobacco history but he quit in 2012.    History reviewed. No pertinent family history.  ROS General: Negative; No fevers, chills, or night sweats;    HEENT: Negative; No changes in vision or hearing, sinus congestion, difficulty swallowing Pulmonary: Negative; No cough, wheezing, shortness of breath, hemoptysis Cardiovascular: See history of present illness; No chest pain, presyncope, syncope, palpatations GI: Negative; No nausea, vomiting, diarrhea, or abdominal pain GU: Positive for neurogenic bladder; No dysuria, hematuria, or difficulty voiding Musculoskeletal: Negative; no myalgias, joint pain, or weakness Hematologic/Oncology: Negative; no easy bruising, bleeding Endocrine: Positive for type 2 diabetes mellitus; no heat/cold intolerance;  Neuro: Negative; no changes in balance, headaches Skin: Negative; No rashes or skin lesions Psychiatric: Negative; **Note De-Identified  Obfuscation** No behavioral problems, depression Sleep: Negative; No snoring, daytime sleepiness, hypersomnolence, bruxism, restless legs, hypnogognic hallucinations, no cataplexy Other comprehensive 14 point system review is negative.   PE BP 152/76  Pulse 77  Ht 5\' 11"  (1.803 m)  Wt 170 lb 3.2 oz (77.202 kg)  BMI 23.75 kg/m2  General: Alert, oriented, no distress.  Skin: normal turgor, no rashes HEENT: Normocephalic, atraumatic. Pupils round and reactive; sclera anicteric;no lid lag.  Nose without nasal septal hypertrophy Mouth/Parynx benign; Mallinpatti scale 3 Neck: No JVD, no carotid bruits with normal carotid up to Lungs: clear to ausculatation and percussion; no wheezing or rales Heart: Irregular irregular rhythm with a ventricular rate in the 70s, s1 s2 normal 1/6 systolic murmur.  No diastolic murmur.  No rubs no heaves Abdomen: soft, nontender; no hepatosplenomehaly, BS+; abdominal aorta nontender and not dilated by palpation. Back: No CVA tenderness Pulses 2+ Extremities: Trace  lower extremity edema. no clubbing cyanosis or edema, Homan's sign negative  Neurologic: grossly nonfocal Psychologic: normal affect and mood.  ECG ((independently read by me): Atrial fibrillation  with coarse fibrillatory waves.  QTc interval 459 ms.  Ventricular rate 77   10/28/2013 ECG: Atrial fibrillation with coarse fibrillatory/flutter waves with a ventricular rate 79  Prior 04/30/2013 ECG: Sinus rhythm with PACs. Left axis detion. Artifact in several leads.  LABS:  BMET    Component Value Date/Time   NA 140 11/04/2013 1135   K 4.3 11/04/2013 1135   CL 106 11/04/2013 1135   CO2 24 11/04/2013 1135   GLUCOSE 92 11/04/2013 1135   BUN 28* 11/04/2013 1135   CREATININE 1.40* 11/04/2013 1135   CREATININE 1.54* 09/16/2012 0425   CALCIUM 8.8 11/04/2013 1135   GFRNONAA 41* 09/16/2012 0425   GFRAA 47* 09/16/2012 0425     Hepatic Function Panel     Component Value Date/Time   PROT 7.2 11/04/2013 1135     CBC    Component Value Date/Time   WBC 9.2 11/04/2013 1135   RBC 4.13* 11/04/2013 1135   HGB 12.7* 11/04/2013 1135   HCT 38.4* 11/04/2013 1135   PLT 200 11/04/2013 1135   MCV 93.0 11/04/2013 1135   MCH 30.8 11/04/2013 1135   MCHC 33.1 11/04/2013 1135   RDW 14.9 11/04/2013 1135     BNP No results found for this basename: probnp    Lipid Panel     Component Value Date/Time   CHOL 182 11/04/2013 1135     RADIOLOGY: No results found.    ASSESSMENT AND PLAN: Mr. Hairfield is 11 years status post his presentation with cardiac tamponade  due to a spontaneous asscending aorta dissection requiring emergent pericadiocentesis and surgical repair and grafting of his ascending aorta. He is 9 years status post CABG surgery. He is status post  PVD intervention by Dr. Allyson Sabal last year.   Since April, he has been documented to be in atrial fibrillation.  This occurred sometime between October and April.  Presently, despite increased metoprolol he remains in atrial fibrillation.  His left atrium is not significantly dilated and upper normal in size.  He now is on [facility] for anticoagulation therapy.  At this point, I recommended institution of amiodarone at 200 mg daily.  After 2 days of this therapy.  He  will then reduce the metoprolol dose to 50 mg twice a day to reduce potential for bradycardia.  See him back in the office in 4-6 weeks.  He will undergo a comprehensive metabolic panel, TSH, and CBC prior **Note De-Identified  Obfuscation** to that office visit.  His blood pressure today was mildly elevated on his current dose of amlodipine in addition to his beta blocker therapy.  He also takes Cardura 3 mg.  Blood pressure will need to be monitored.  If with the reduced dose of metoprolol.  Blood pressure further increases.  Additional change in medical regimen will be necessary.   Lennette Biharihomas A. Kelly, MD, [facility]  12/17/2013 7:23 AM

## 2013-12-21 ENCOUNTER — Telehealth: Payer: Self-pay | Admitting: Cardiovascular Disease

## 2013-12-21 NOTE — Telephone Encounter (Signed)
Pt. States he's not sob nor does he have a rash he states he has been feeling weak since last Thursday and thinks its the cymbalta that his pain management doctor put him on recently, The pt. Stated he had call his pain management doctor and was still waiting for a cal back from them

## 2013-12-21 NOTE — Telephone Encounter (Signed)
Having  A reaction to the newer drugs that he was placed on .Marland Kitchen. He has several different medications .Marland Kitchen. Please call  Thanks

## 2014-01-21 LAB — CBC
HEMATOCRIT: 33.7 % — AB (ref 39.0–52.0)
Hemoglobin: 11.3 g/dL — ABNORMAL LOW (ref 13.0–17.0)
MCH: 30.8 pg (ref 26.0–34.0)
MCHC: 33.5 g/dL (ref 30.0–36.0)
MCV: 91.8 fL (ref 78.0–100.0)
Platelets: 208 10*3/uL (ref 150–400)
RBC: 3.67 MIL/uL — AB (ref 4.22–5.81)
RDW: 14.7 % (ref 11.5–15.5)
WBC: 6.8 10*3/uL (ref 4.0–10.5)

## 2014-01-21 LAB — COMPREHENSIVE METABOLIC PANEL
ALBUMIN: 3.7 g/dL (ref 3.5–5.2)
ALT: 8 U/L (ref 0–53)
AST: 12 U/L (ref 0–37)
Alkaline Phosphatase: 61 U/L (ref 39–117)
BUN: 32 mg/dL — AB (ref 6–23)
CHLORIDE: 104 meq/L (ref 96–112)
CO2: 24 mEq/L (ref 19–32)
Calcium: 8.7 mg/dL (ref 8.4–10.5)
Creat: 1.61 mg/dL — ABNORMAL HIGH (ref 0.50–1.35)
GLUCOSE: 94 mg/dL (ref 70–99)
Potassium: 4.5 mEq/L (ref 3.5–5.3)
Sodium: 138 mEq/L (ref 135–145)
Total Bilirubin: 0.5 mg/dL (ref 0.2–1.2)
Total Protein: 7.3 g/dL (ref 6.0–8.3)

## 2014-01-21 LAB — TSH: TSH: 4.977 u[IU]/mL — ABNORMAL HIGH (ref 0.350–4.500)

## 2014-02-15 ENCOUNTER — Telehealth: Payer: Self-pay | Admitting: Cardiovascular Disease

## 2014-02-15 NOTE — Telephone Encounter (Signed)
Susan(Dr. Miller's nurse) called in wanting to get the office note from when Dr. Tresa EndoKelly last saw him on 06/19 and any results from blood work that could have been done. The fax number is [phone]  Thanks

## 2014-02-15 NOTE — Telephone Encounter (Signed)
Deferred to Medical records

## 2014-03-04 ENCOUNTER — Telehealth: Payer: Self-pay | Admitting: *Deleted

## 2014-03-04 NOTE — Telephone Encounter (Signed)
Left message to call back at his convenience to get ab results.

## 2014-03-04 NOTE — Telephone Encounter (Signed)
Notified patient of lab results. He requests for me to send a copy to his PCP- Dr. Sigmund Hazel. Labs sent to Dr. Hyacinth Meeker for review.

## 2014-03-04 NOTE — Telephone Encounter (Signed)
Message copied by Gaynelle Cage on Fri Mar 04, 2014  3:23 PM ------      Message from: Nicki Guadalajara A      Created: Tue Mar 01, 2014  5:23 PM       Mild anemia; TSH borderline increased; renal insuff fairly stable ------

## 2014-03-04 NOTE — Telephone Encounter (Signed)
Message copied by Gaynelle Cage on Fri Mar 04, 2014  2:20 PM ------      Message from: Nicki Guadalajara A      Created: Tue Mar 01, 2014  5:23 PM       Mild anemia; TSH borderline increased; renal insuff fairly stable ------

## 2014-03-31 ENCOUNTER — Other Ambulatory Visit: Payer: Self-pay

## 2014-03-31 MED ORDER — AMLODIPINE BESYLATE 5 MG PO TABS: 5.0000 mg | ORAL_TABLET | Freq: Every day | ORAL | Status: DC

## 2014-03-31 NOTE — Telephone Encounter (Signed)
Rx sent to pharmacy   

## 2014-04-01 ENCOUNTER — Telehealth: Payer: Self-pay | Admitting: Cardiovascular Disease

## 2014-04-01 NOTE — Telephone Encounter (Signed)
Addressed by Darrold JunkerLaPorsha McCoy - see refill encounter from 03/31/14

## 2014-04-19 ENCOUNTER — Ambulatory Visit (INDEPENDENT_AMBULATORY_CARE_PROVIDER_SITE_OTHER): Payer: Medicare Other | Admitting: Cardiovascular Disease

## 2014-04-19 ENCOUNTER — Encounter: Payer: Self-pay | Admitting: Cardiovascular Disease

## 2014-04-19 VITALS — BP 110/58 | HR 67 | Ht 71.5 in | Wt 175.0 lb

## 2014-04-19 DIAGNOSIS — I739 Peripheral vascular disease, unspecified: Secondary | ICD-10-CM

## 2014-04-19 DIAGNOSIS — I480 Paroxysmal atrial fibrillation: Secondary | ICD-10-CM

## 2014-04-19 DIAGNOSIS — I2510 Atherosclerotic heart disease of native coronary artery without angina pectoris: Secondary | ICD-10-CM

## 2014-04-19 DIAGNOSIS — I70229 Atherosclerosis of native arteries of extremities with rest pain, unspecified extremity: Secondary | ICD-10-CM

## 2014-04-19 DIAGNOSIS — I998 Other disorder of circulatory system: Secondary | ICD-10-CM

## 2014-04-19 DIAGNOSIS — I10 Essential (primary) hypertension: Secondary | ICD-10-CM

## 2014-04-19 NOTE — Progress Notes (Signed)
04/19/2014 [name] [name]   06/04/1929  [id number]  Primary Physician [name] [name],[name] LYNN, [name] [name] Primary Cardiologist: [name] [name] [name] [name],[name] [name],[name] [name], [name] [name]   HPI: The patient is a very pleasant, 80 year old, thin-appearing, married Caucasian male, father of 1 who is accompanied by his wife today. He is a patient of [name] [name]. He has a history of ascending thoracic aortic aneurysm repair with coronary artery bypass grafting x4 by [name] [name] August 2006. His other problems include paroxysmal A-fib, hypertension, type 2 diabetes, as well as a neurogenic bladder. He was on Coumadin remotely but this was stopped because of urologic bleeding. He had an AVM repair by [name] [name] at [facility] in 2004 which left him numb below the waist and he is minimally ambulatory with the aid of a walker. He had a nonhealing wound on his right heel with Dopplers in our office that showed a right ABI 0.58 and a left of 0.65. I ultimately angiogram'd him initially on February 16 and angioplastied his left external iliac artery and demonstrated high-grade calcified mid right SFA. I was unable to cross its bifurcation. He had 1-vessel runoff via peroneal. I brought him back on September 15, 2012, and accessed him antegrade, [facility]here[facility] and Chocolate Balloon angioplasty with an excellent angiographic result. His followup Dopplers showed improvement in his right ABI from 0.58 to 0.67 and his left from 0.65 to 0.82. His heel ulcer is also healing as well according to Dr. Jimmey [name] and the patient.  I saw him back in the office 12 months ago. Since that time wounds on both feet have subsequently healed with the help of the excellent care provided by [name] [name] Parker at [facility]. His Dopplers performed /22/15 revealed ABIs of the 0.84/22/15 revealed ABIs of any 0.8 range bilaterally with a patent right SFA and moderate disease in the left external iliac  artery.    Current Outpatient Prescriptions  Medication Sig Dispense Refill  . amiodarone (PACERONE) 200 MG tablet Take 1 tablet (200 mg total) by mouth daily.  30 tablet  6  . amLODipine (NORVASC) 5 MG tablet Take 1 tablet (5 mg total) by mouth daily. Takes 1/2 tablet daily  30 tablet  8  . apixaban (ELIQUIS) 2.5 MG TABS tablet Take 1 tablet (2.5 mg total) by mouth 2 (two) times daily.  60 tablet  11  . aspirin 81 MG tablet Take 81 mg by mouth daily.      Marland Kitchen. doxazosin (CARDURA) 2 MG tablet Take 1.5 tablets (3 mg total) by mouth at bedtime.  135 tablet  3  . ezetimibe (ZETIA) 10 MG tablet Take 1 tablet (10 mg total) by mouth daily.  90 tablet  3  . gabapentin (NEURONTIN) 300 MG capsule Take 300 mg by mouth See admin instructions. 2 tabs every morning, 1 tab around lunch and 2 tabs at bedtime; 5 tabs total each day.      . insulin glargine (LANTUS) 100 UNIT/ML injection Inject 20 Units into the skin daily.      . metoprolol (LOPRESSOR) 50 MG tablet Take 50 mg by mouth 2 (two) times daily.      Marland Kitchen. oxyCODONE-acetaminophen (PERCOCET/ROXICET) 5-325 MG per tablet Take 1 tablet by mouth See admin instructions. 2 tabs every morning, 1 tab around lunch and 2 tabs at bedtime; 5 tabs total each day.       No current facility-administered medications for this visit.    Allergies  Allergen Reactions  . Statins Other (See Comments)    Leg pains    History   Social History  . Marital Status: Married    Spouse Name: N/A    Number of Children: N/A  . Years of Education: N/A   Occupational History  . Not on file.   Social History Main Topics  . Smoking status: Former Games developermoker  . Smokeless tobacco: Never Used  . Alcohol Use: Not on file  . Drug Use: No  . Sexual Activity: Yes   Other Topics Concern  . Not on file   Social History Narrative  . No narrative on file     Review of Systems: General: negative for chills, fever, night sweats or weight changes.  Cardiovascular: negative for  chest pain, dyspnea on exertion, edema, orthopnea, palpitations, paroxysmal nocturnal dyspnea or shortness of breath Dermatological: negative for rash Respiratory: negative for cough or wheezing Urologic: negative for hematuria Abdominal: negative for nausea, vomiting, diarrhea, bright red blood per rectum, melena, or hematemesis Neurologic: negative for visual changes, syncope, or dizziness All other systems reviewed and are otherwise negative except as noted above.    Blood pressure 110/58, pulse 67, height 5' 11.5" (1.816 m), weight 175 lb (79.379 kg).  General appearance: alert and no distress Neck: no adenopathy, no carotid bruit, no JVD, supple, symmetrical, trachea midline and thyroid not enlarged, symmetric, no tenderness/mass/nodules Lungs: clear to auscultation bilaterally Heart: irregularly irregular rhythm Extremities: extremities normal, atraumatic, no cyanosis or edema  EKG atrial fibrillation with a ventricular response of 67 and septal Q waves  ASSESSMENT AND PLAN:   Critical lower limb ischemia - Right-sided non-healing foot ulcer History of peripheral artery disease status post angioplasty of his left external iliac artery followed by staged right SFA diamondback were ruled reddish lobectomy and chocolate balloon angioplasty for treatment of critical limb ischemia. His heel ulcer ultimately healed. His most recent Doppler studies performed/22/15 revealed ABIs of 8.8 range bilaterally with a patent right SFA stent and mildly elevated velocities in the left external iliac artery. The patient is paralyzed and wheelchair dependent and therefore does not have claudication.      [name] GessJonathan J. Tremar Wickens [name] [name] FACP,[name] [name],[name] [name], [facility] 04/19/2014 2:59 PM

## 2014-04-19 NOTE — Assessment & Plan Note (Signed)
History of peripheral artery disease status post angioplasty of his left external iliac artery followed by staged right SFA diamondback were ruled reddish lobectomy and chocolate balloon angioplasty for treatment of critical limb ischemia. His heel ulcer ultimately healed. His most recent Doppler studies performed/22/15 revealed ABIs of 8.8 range bilaterally with a patent right SFA stent and mildly elevated velocities in the left external iliac artery. The patient is paralyzed and wheelchair dependent and therefore does not have claudication.

## 2014-04-19 NOTE — Patient Instructions (Signed)
Your physician recommends that you schedule a follow-up appointment with Dr. Allyson SabalBerry as needed.   You will need a lower extremity arterial doppler now and then again in about 6 months.

## 2014-05-09 ENCOUNTER — Ambulatory Visit (HOSPITAL_COMMUNITY)
Admission: RE | Admit: 2014-05-09 | Discharge: 2014-05-09 | Disposition: A | Discharge status: Home / Self Care | Payer: Medicare Other | Source: Ambulatory Visit | Attending: Cardiology | Admitting: Cardiology

## 2014-05-09 DIAGNOSIS — I739 Peripheral vascular disease, unspecified: Secondary | ICD-10-CM | POA: Diagnosis present

## 2014-05-09 NOTE — Progress Notes (Signed)
Arterial Lower Ext. Duplex Completed. Navy Belay, BS, RDMS, RVT  

## 2014-05-13 ENCOUNTER — Encounter: Payer: Self-pay | Admitting: *Deleted

## 2014-05-17 ENCOUNTER — Other Ambulatory Visit: Payer: Self-pay | Admitting: Family Medicine

## 2014-05-17 ENCOUNTER — Telehealth: Payer: Self-pay | Admitting: Cardiovascular Disease

## 2014-05-17 DIAGNOSIS — D649 Anemia, unspecified: Secondary | ICD-10-CM

## 2014-05-18 ENCOUNTER — Encounter: Payer: Self-pay | Admitting: Internal Medicine

## 2014-05-18 ENCOUNTER — Inpatient Hospital Stay (HOSPITAL_COMMUNITY)
Admission: AD | Admit: 2014-05-18 | Discharge: 2014-05-21 | DRG: 377 | Disposition: A | Discharge status: Home / Self Care | Payer: Medicare Other | Source: Ambulatory Visit | Attending: Internal Medicine | Admitting: Internal Medicine

## 2014-05-18 ENCOUNTER — Encounter (HOSPITAL_COMMUNITY): Payer: Self-pay | Admitting: *Deleted

## 2014-05-18 ENCOUNTER — Inpatient Hospital Stay (HOSPITAL_COMMUNITY): Payer: Medicare Other

## 2014-05-18 DIAGNOSIS — I739 Peripheral vascular disease, unspecified: Secondary | ICD-10-CM | POA: Diagnosis present

## 2014-05-18 DIAGNOSIS — Z888 Allergy status to other drugs, medicaments and biological substances status: Secondary | ICD-10-CM | POA: Diagnosis not present

## 2014-05-18 DIAGNOSIS — G629 Polyneuropathy, unspecified: Secondary | ICD-10-CM | POA: Diagnosis present

## 2014-05-18 DIAGNOSIS — R0600 Dyspnea, unspecified: Secondary | ICD-10-CM

## 2014-05-18 DIAGNOSIS — Z7982 Long term (current) use of aspirin: Secondary | ICD-10-CM | POA: Diagnosis not present

## 2014-05-18 DIAGNOSIS — N390 Urinary tract infection, site not specified: Secondary | ICD-10-CM | POA: Diagnosis present

## 2014-05-18 DIAGNOSIS — I129 Hypertensive chronic kidney disease with stage 1 through stage 4 chronic kidney disease, or unspecified chronic kidney disease: Secondary | ICD-10-CM | POA: Diagnosis present

## 2014-05-18 DIAGNOSIS — I252 Old myocardial infarction: Secondary | ICD-10-CM

## 2014-05-18 DIAGNOSIS — Z87891 Personal history of nicotine dependence: Secondary | ICD-10-CM | POA: Diagnosis not present

## 2014-05-18 DIAGNOSIS — G8220 Paraplegia, unspecified: Secondary | ICD-10-CM | POA: Diagnosis present

## 2014-05-18 DIAGNOSIS — Z794 Long term (current) use of insulin: Secondary | ICD-10-CM | POA: Diagnosis not present

## 2014-05-18 DIAGNOSIS — N179 Acute kidney failure, unspecified: Secondary | ICD-10-CM | POA: Diagnosis present

## 2014-05-18 DIAGNOSIS — E119 Type 2 diabetes mellitus without complications: Secondary | ICD-10-CM

## 2014-05-18 DIAGNOSIS — N184 Chronic kidney disease, stage 4 (severe): Secondary | ICD-10-CM | POA: Diagnosis present

## 2014-05-18 DIAGNOSIS — E1122 Type 2 diabetes mellitus with diabetic chronic kidney disease: Secondary | ICD-10-CM | POA: Diagnosis present

## 2014-05-18 DIAGNOSIS — D638 Anemia in other chronic diseases classified elsewhere: Secondary | ICD-10-CM | POA: Diagnosis present

## 2014-05-18 DIAGNOSIS — Z951 Presence of aortocoronary bypass graft: Secondary | ICD-10-CM | POA: Diagnosis not present

## 2014-05-18 DIAGNOSIS — K922 Gastrointestinal hemorrhage, unspecified: Principal | ICD-10-CM | POA: Diagnosis present

## 2014-05-18 DIAGNOSIS — I482 Chronic atrial fibrillation: Secondary | ICD-10-CM

## 2014-05-18 DIAGNOSIS — E11649 Type 2 diabetes mellitus with hypoglycemia without coma: Secondary | ICD-10-CM | POA: Diagnosis present

## 2014-05-18 DIAGNOSIS — T39015A Adverse effect of aspirin, initial encounter: Secondary | ICD-10-CM | POA: Diagnosis present

## 2014-05-18 DIAGNOSIS — J189 Pneumonia, unspecified organism: Secondary | ICD-10-CM | POA: Diagnosis present

## 2014-05-18 DIAGNOSIS — I4891 Unspecified atrial fibrillation: Secondary | ICD-10-CM | POA: Diagnosis present

## 2014-05-18 DIAGNOSIS — N183 Chronic kidney disease, stage 3 unspecified: Secondary | ICD-10-CM | POA: Diagnosis present

## 2014-05-18 DIAGNOSIS — J9601 Acute respiratory failure with hypoxia: Secondary | ICD-10-CM | POA: Diagnosis present

## 2014-05-18 DIAGNOSIS — R531 Weakness: Secondary | ICD-10-CM

## 2014-05-18 DIAGNOSIS — I2510 Atherosclerotic heart disease of native coronary artery without angina pectoris: Secondary | ICD-10-CM | POA: Diagnosis present

## 2014-05-18 DIAGNOSIS — E785 Hyperlipidemia, unspecified: Secondary | ICD-10-CM | POA: Diagnosis present

## 2014-05-18 DIAGNOSIS — I10 Essential (primary) hypertension: Secondary | ICD-10-CM | POA: Diagnosis present

## 2014-05-18 DIAGNOSIS — Z7901 Long term (current) use of anticoagulants: Secondary | ICD-10-CM

## 2014-05-18 DIAGNOSIS — K5900 Constipation, unspecified: Secondary | ICD-10-CM | POA: Diagnosis present

## 2014-05-18 DIAGNOSIS — R0902 Hypoxemia: Secondary | ICD-10-CM

## 2014-05-18 LAB — URINALYSIS, ROUTINE W REFLEX MICROSCOPIC
BILIRUBIN URINE: NEGATIVE
GLUCOSE, UA: NEGATIVE mg/dL
Ketones, ur: NEGATIVE mg/dL
Nitrite: POSITIVE — AB
PH: 6.5 (ref 5.0–8.0)
Protein, ur: 100 mg/dL — AB
Specific Gravity, Urine: 1.014 (ref 1.005–1.030)
Urobilinogen, UA: 0.2 mg/dL (ref 0.0–1.0)

## 2014-05-18 LAB — HEMOGLOBIN A1C
Hgb A1c MFr Bld: 6.5 % — ABNORMAL HIGH (ref <5.7)
Mean Plasma Glucose: 140 mg/dL — ABNORMAL HIGH (ref <117)

## 2014-05-18 LAB — TROPONIN I

## 2014-05-18 LAB — PRO B NATRIURETIC PEPTIDE: Pro B Natriuretic peptide (BNP): 4183 pg/mL — ABNORMAL HIGH (ref 0–450)

## 2014-05-18 LAB — COMPREHENSIVE METABOLIC PANEL
ALT: 9 U/L (ref 0–53)
AST: 14 U/L (ref 0–37)
Albumin: 3 g/dL — ABNORMAL LOW (ref 3.5–5.2)
Alkaline Phosphatase: 71 U/L (ref 39–117)
Anion gap: 10 (ref 5–15)
BUN: 38 mg/dL — ABNORMAL HIGH (ref 6–23)
CALCIUM: 9.1 mg/dL (ref 8.4–10.5)
CO2: 23 meq/L (ref 19–32)
CREATININE: 2.06 mg/dL — AB (ref 0.50–1.35)
Chloride: 108 mEq/L (ref 96–112)
GFR, EST AFRICAN AMERICAN: 33 mL/min — AB (ref >90)
GFR, EST NON AFRICAN AMERICAN: 28 mL/min — AB (ref >90)
Glucose, Bld: 60 mg/dL — ABNORMAL LOW (ref 70–99)
Potassium: 5.3 mEq/L (ref 3.7–5.3)
Sodium: 141 mEq/L (ref 137–147)
TOTAL PROTEIN: 7.6 g/dL (ref 6.0–8.3)
Total Bilirubin: 0.5 mg/dL (ref 0.3–1.2)

## 2014-05-18 LAB — CBC
HEMATOCRIT: 28.5 % — AB (ref 39.0–52.0)
Hemoglobin: 9 g/dL — ABNORMAL LOW (ref 13.0–17.0)
MCH: 28.8 pg (ref 26.0–34.0)
MCHC: 31.6 g/dL (ref 30.0–36.0)
MCV: 91.1 fL (ref 78.0–100.0)
Platelets: 225 10*3/uL (ref 150–400)
RBC: 3.13 MIL/uL — ABNORMAL LOW (ref 4.22–5.81)
RDW: 15.1 % (ref 11.5–15.5)
WBC: 9.3 10*3/uL (ref 4.0–10.5)

## 2014-05-18 LAB — PHOSPHORUS: Phosphorus: 3.3 mg/dL (ref 2.3–4.6)

## 2014-05-18 LAB — URINE MICROSCOPIC-ADD ON

## 2014-05-18 LAB — APTT: APTT: 39 s — AB (ref 24–37)

## 2014-05-18 LAB — PROTIME-INR
INR: 1.26 (ref 0.00–1.49)
Prothrombin Time: 16 seconds — ABNORMAL HIGH (ref 11.6–15.2)

## 2014-05-18 LAB — TSH: TSH: 2.94 u[IU]/mL (ref 0.350–4.500)

## 2014-05-18 LAB — MAGNESIUM: MAGNESIUM: 2.7 mg/dL — AB (ref 1.5–2.5)

## 2014-05-18 MED ORDER — ONDANSETRON HCL 4 MG/2ML IJ SOLN: 4.0000 mg | Freq: Four times a day (QID) | INTRAMUSCULAR | Status: DC | PRN

## 2014-05-18 MED ORDER — ONDANSETRON HCL 4 MG PO TABS: 4.0000 mg | ORAL_TABLET | Freq: Four times a day (QID) | ORAL | Status: DC | PRN

## 2014-05-18 MED ORDER — CEFTRIAXONE SODIUM IN DEXTROSE 20 MG/ML IV SOLN
1.0000 g | INTRAVENOUS | Status: DC
  Administered 2014-05-18 – 2014-05-20 (×3): 1 g via INTRAVENOUS
  Filled 2014-05-18 (×4): qty 50

## 2014-05-18 MED ORDER — AMLODIPINE BESYLATE 5 MG PO TABS
5.0000 mg | ORAL_TABLET | Freq: Every day | ORAL | Status: DC
  Administered 2014-05-18 – 2014-05-21 (×4): 5 mg via ORAL
  Filled 2014-05-18 (×4): qty 1

## 2014-05-18 MED ORDER — OXYCODONE-ACETAMINOPHEN 5-325 MG PO TABS
1.0000 | ORAL_TABLET | Freq: Every day | ORAL | Status: DC
  Administered 2014-05-18: 1 via ORAL
  Filled 2014-05-18: qty 1

## 2014-05-18 MED ORDER — SODIUM CHLORIDE 0.9 % IJ SOLN
3.0000 mL | Freq: Two times a day (BID) | INTRAMUSCULAR | Status: DC
  Administered 2014-05-18 – 2014-05-21 (×3): 3 mL via INTRAVENOUS

## 2014-05-18 MED ORDER — OXYCODONE-ACETAMINOPHEN 5-325 MG PO TABS
1.0000 | ORAL_TABLET | ORAL | Status: DC | PRN
  Administered 2014-05-19: 1 via ORAL
  Filled 2014-05-18 (×4): qty 1

## 2014-05-18 MED ORDER — DOXAZOSIN MESYLATE 2 MG PO TABS
2.0000 mg | ORAL_TABLET | Freq: Every day | ORAL | Status: DC
  Administered 2014-05-18 – 2014-05-20 (×3): 2 mg via ORAL
  Filled 2014-05-18 (×4): qty 1

## 2014-05-18 MED ORDER — INSULIN GLARGINE 100 UNIT/ML ~~LOC~~ SOLN: 20.0000 [IU] | Freq: Every day | SUBCUTANEOUS | Status: DC

## 2014-05-18 MED ORDER — GABAPENTIN 300 MG PO CAPS
600.0000 mg | ORAL_CAPSULE | Freq: Two times a day (BID) | ORAL | Status: DC
  Administered 2014-05-18 – 2014-05-21 (×6): 600 mg via ORAL
  Filled 2014-05-18 (×8): qty 2

## 2014-05-18 MED ORDER — SODIUM CHLORIDE 0.9 % IV SOLN: INTRAVENOUS | Status: DC

## 2014-05-18 MED ORDER — EZETIMIBE 10 MG PO TABS
10.0000 mg | ORAL_TABLET | Freq: Every day | ORAL | Status: DC
  Administered 2014-05-18 – 2014-05-21 (×4): 10 mg via ORAL
  Filled 2014-05-18 (×4): qty 1

## 2014-05-18 MED ORDER — AMIODARONE HCL 200 MG PO TABS
200.0000 mg | ORAL_TABLET | Freq: Every evening | ORAL | Status: DC
  Administered 2014-05-18 – 2014-05-20 (×3): 200 mg via ORAL
  Filled 2014-05-18 (×4): qty 1

## 2014-05-18 MED ORDER — INSULIN ASPART 100 UNIT/ML ~~LOC~~ SOLN
0.0000 [IU] | Freq: Three times a day (TID) | SUBCUTANEOUS | Status: DC
  Administered 2014-05-19: 2 [IU] via SUBCUTANEOUS
  Administered 2014-05-19: 1 [IU] via SUBCUTANEOUS
  Administered 2014-05-19: 2 [IU] via SUBCUTANEOUS
  Administered 2014-05-20: 9 [IU] via SUBCUTANEOUS
  Administered 2014-05-21: 7 [IU] via SUBCUTANEOUS
  Administered 2014-05-21: 1 [IU] via SUBCUTANEOUS

## 2014-05-18 MED ORDER — FLEET ENEMA 7-19 GM/118ML RE ENEM
1.0000 | ENEMA | Freq: Every day | RECTAL | Status: DC
  Administered 2014-05-19: 1 via RECTAL
  Filled 2014-05-18 (×2): qty 1

## 2014-05-18 MED ORDER — METOPROLOL TARTRATE 50 MG PO TABS
50.0000 mg | ORAL_TABLET | Freq: Two times a day (BID) | ORAL | Status: DC
  Administered 2014-05-18 – 2014-05-21 (×7): 50 mg via ORAL
  Filled 2014-05-18 (×9): qty 1

## 2014-05-18 MED ORDER — OXYCODONE-ACETAMINOPHEN 5-325 MG PO TABS
2.0000 | ORAL_TABLET | Freq: Two times a day (BID) | ORAL | Status: DC
  Administered 2014-05-18 – 2014-05-21 (×6): 2 via ORAL
  Filled 2014-05-18 (×5): qty 2

## 2014-05-18 MED ORDER — GABAPENTIN 300 MG PO CAPS
300.0000 mg | ORAL_CAPSULE | Freq: Every day | ORAL | Status: DC
  Administered 2014-05-18 – 2014-05-20 (×3): 300 mg via ORAL
  Filled 2014-05-18 (×4): qty 1

## 2014-05-18 NOTE — Plan of Care (Signed)
Problem: Phase I Progression Outcomes Goal: Pain controlled with appropriate interventions Outcome: Completed/Met Date Met:  05/18/14 Goal: Initial discharge plan identified Outcome: Completed/Met Date Met:  05/18/14 Goal: Hemodynamically stable Outcome: Completed/Met Date Met:  05/18/14 Goal: Other Phase I Outcomes/Goals Outcome: Completed/Met Date Met:  05/18/14

## 2014-05-18 NOTE — H&P (Signed)
  Request for direct admission by Dr. Hyacinth MeekerMiller.  Debbora PrestoMAGICK-Jalayiah Bibian, MD  [facility] Pager [phone]  If 7PM-7AM, please contact night-coverage [email or web address] Password [email or web address]

## 2014-05-18 NOTE — H&P (Signed)
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 He explains he noted some dyspnea with exertion that started one week prior to this admission but per wife with who I spoke over the phone explains that dyspnea has started ~ 3 weeks prior to this admission and has been associated with intermittent episodes of slightly productive clear cough. Wife explains, she helps her husband with daily enemas and she noted that pt has been more backed up and she was having more difficulty doing enemas over the past week.  In addition, wife explained she took him to his PCP as she also noted more confusion. Dr. Hyacinth Meeker requested CXR (did not reveal any specific cardiopulmonary abnormality) but blood work was notable for anemia )awaiting for the records). There was a concern of GI bleed as pt is on Eliquis (started per cardiologist Dr. Tresa Endo), but pt and wife explain they have not seen any blood in stool as pt appeared to be more on the constipated side. Last colonoscopy apparently years ago (unknwon results).   Dr. Hyacinth Meeker called [facility] team to have pt directly admitted for further evaluation of ? Constipation, GI bleed, dyspnea, confusion. Telemetry bed requested.   Assessment and Plan: Active Problems:   Acute hypoxic respiratory failure - initial oxygen saturation on RA 86% - unclear etiology, currently stable with no tachypnea  - will ask for CXR, cycle CE, check BNP, TSH - for now provide oxygen via [place] to keep  oxygen saturation > 92% - will hold off on any IVF for now as pt appears to be euvolemic   Confusion - noted per wife at home - no clear etiology - UA suggestive of UTI - will place on empiric Rocephin for now and will follow up on urine culture - will eventually need PT/OT evaluation    ? Constipation - continue daily enemas per home medical regimen    Diabetes - Type II with complications of CKD stage II - III and ACD - initial CBG in 60's, so will hold home dose Lantus for now - place on SSI sensitive coverage only intul oral intake improves    Anemia of chronic disease, CKD with ? Acute GI bleed  - last known hg in 12/2013 ~ 11 - awaiting for CBC, transfuse if Hg < 7.5 - may need GI consult in AM based on CBC and clinical status  - hold Eliquis for now until we ensure no GI bleed    Acute on chronic renal failure, stage III (moderate) - last known Cr in 12/2013 with baseline Cr 1.4 - 1.7 - awaiting for BMP  - monitor daily weights and I's and O's   Atrial fibrillation - continue amiodarone but hold Eliquis until GI bleed ruled out    HTN (hypertension) - continue Norvasc and Metoprolol   CAD - CABG x 4 in 2006 - holding aspirin until GI bleed rule out - continue statin   Radiological Exams on Admission: No results found.   Code  Status: Full Family Communication: Pt at bedside Disposition Plan: Admit for further evaluation     Review of Systems:  Constitutional: Negative for diaphoresis.  HENT: Negative for hearing loss, ear pain, nosebleeds, congestion, sore throat, neck pain, tinnitus and ear discharge.   Eyes: Negative for blurred vision, double vision, photophobia, pain, discharge and redness.  Respiratory: Negative for cough, hemoptysis, sputum production, wheezing and stridor.   Cardiovascular: Negative for chest pain, palpitations, orthopnea, claudication and leg swelling.  Gastrointestinal: Negative for nausea, vomiting and abdominal pain.  Genitourinary:  Negative for dysuria, urgency, frequency, hematuria and flank pain.  Musculoskeletal: Negative for myalgias, back pain, joint pain and falls.  Skin: Negative for itching and rash.  Neurological: Negative for dizziness and weakness.  Endo/Heme/Allergies: Negative for environmental allergies and polydipsia. Does not bruise/bleed easily.  Psychiatric/Behavioral: Negative for suicidal ideas. The patient is not nervous/anxious.      Past Medical History  Diagnosis Date  . Peripheral vascular disease     critical limb ischemia  . Myocardial infarction   . Diabetes mellitus without complication   . Hypertension   . Coronary artery disease   . Atrial fibrillation   . Aortic dissection     w/ Cardiac tamponade  . AVM (arteriovenous malformation)   . Murmur   . Hyperlipidemia   . Neuropathy of left lower extremity   . S/P CABG (coronary artery bypass graft)     Past Surgical History  Procedure Laterality Date  . Coronary artery bypass graft  02/11/2005    LIMA to LAD, SVG to 1st diag, SVG to OM1, and SVG to PD  . Back surgery    . Atherectomy  09/15/2012    Diamondback orbital rotation atherectomy was performed using a 2 mm solid crown up to a max of 120,000 RPM. Angioplasty was performed using a 5x13420mm long chocolate balloon for 2 minute inflations. Resulting in reduction of an 80% calcified distal R SFA stenosis to less than 20%  . Lea doppler  10/01/2012    R SFA demonstrated a mild amount of residual plaque suggesting less then 50% diametere reduction. R Calf runoff-one vessel runoff via peroneal artery. The posterior and anterior tibial arteries appeared occluded.  . Cardiac catheterization  02/04/2005    Severe 3-vessel disease affecting LAD and RCA. Consider CABG  . Cardiovascular stress test  12/16/2006    EKG negative for ischemia. No ECG changes. No significant ischemia demonstrated  . Transthoracic echocardiogram  12/04/2011    EF 50-55%, moderate aortic root dilation    Social  History:  reports that he has quit smoking. He has never used smokeless tobacco. He reports that he does not use illicit drugs. His alcohol history is not on file.  Allergies  Allergen Reactions  . Statins Other (See Comments)    Leg pains    No known family medical history   Prior to Admission medications   Medication Sig Start Date End Date Taking? Authorizing Provider  amiodarone (PACERONE) 200 MG tablet Take 1 tablet (200 mg total) by mouth daily. 12/16/13   Lennette Biharihomas A Kelly, MD  amLODipine (NORVASC) 5 MG tablet Take 1 tablet (5 mg total) by mouth daily. Takes 1/2 tablet daily 03/31/14   Lennette Biharihomas A Kelly, MD  apixaban (ELIQUIS) 2.5 MG TABS tablet Take 1 tablet (2.5 mg total) by mouth 2 (two) times daily. 10/28/13   Lennette Biharihomas A Kelly, MD  aspirin 81 MG tablet Take 81 mg by mouth daily.    Historical Provider, MD  doxazosin (CARDURA) 2 MG tablet Take 1.5 tablets (3 mg total) by mouth at bedtime. 11/30/13   Lennette Biharihomas A Kelly, MD  ezetimibe (ZETIA) 10 MG tablet Take 1 tablet (10 mg total) by mouth daily. 12/29/12   Runell GessJonathan J Berry, MD  gabapentin (NEURONTIN) 300 MG capsule Take 300 mg by mouth See admin instructions. 2 tabs every morning, 1 tab around lunch and 2 tabs at bedtime; 5 tabs total each day.    Historical Provider, MD  insulin glargine (LANTUS) 100 UNIT/ML injection Inject 20 Units into the skin daily.    Historical Provider, MD  metoprolol (LOPRESSOR) 50 MG tablet Take 50 mg by mouth 2 (two) times daily. 10/28/13   Lennette Biharihomas A Kelly, MD  oxyCODONE-acetaminophen (PERCOCET/ROXICET) 5-325 MG per tablet Take 1 tablet by mouth See admin instructions. 2 tabs every morning, 1 tab around lunch and 2 tabs at bedtime; 5 tabs total each day.    Historical Provider, MD    Physical Exam: Filed Vitals:   05/18/14 1328  BP: 119/52  Pulse: 87  Temp: 98 F (36.7 C)  TempSrc: Oral  Resp: 20  Weight: 77.4 kg (170 lb 10.2 oz)  SpO2: 86%    Physical Exam  Constitutional: Appears well-developed and  well-nourished. No distress.  HENT: Normocephalic. External right and left ear normal. Oropharynx is clear and moist.  Eyes: Conjunctivae and EOM are normal. PERRLA, no scleral icterus.  Neck: Normal ROM. Neck supple. No JVD. No tracheal deviation. No thyromegaly.  CVS: IRRR, no gallops, no carotid bruit.  Pulmonary: Effort and breath sounds normal, no stridor,mild rhonchi at bases Abdominal: Soft. BS +,  Mild distension, tenderness, rebound or guarding.  Musculoskeletal: Normal range of motion. No edema and no tenderness.  Lymphadenopathy: No lymphadenopathy noted, cervical, inguinal. Neuro: Alert. Normal reflexes, muscle tone coordination. No cranial nerve deficit. Skin: Skin is warm and dry. No rash noted. Not diaphoretic. No erythema. No pallor.  Psychiatric: Normal mood and affect. Behavior, judgment, thought content normal.   Labs on Admission:  Basic Metabolic Panel: No results for input(s): NA, K, CL, CO2, GLUCOSE, BUN, CREATININE, CALCIUM, MG, PHOS in the last 168 hours. Liver Function Tests: No results for input(s): AST, ALT, ALKPHOS, BILITOT, PROT, ALBUMIN in the last 168 hours. No results for input(s): LIPASE, AMYLASE in the last 168 hours. No results for input(s): AMMONIA in the last 168 hours. CBC: No results for input(s): WBC, NEUTROABS, HGB, HCT, MCV, PLT in the last 168 hours. Cardiac Enzymes: No results for input(s): CKTOTAL, CKMB, CKMBINDEX, TROPONINI in the last 168 hours. BNP: Invalid input(s): POCBNP CBG: No results for input(s): GLUCAP in the last 168 hours.  EKG: Normal sinus rhythm, no ST/T wave changes  Debbora PrestoMAGICK-Netta Fodge, MD  [name] Pager [phone]  If 7PM-7AM, please contact night-coverage [email or web address] Password [email or web address] 05/18/2014, 1:31 PM
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
## 2014-05-19 ENCOUNTER — Inpatient Hospital Stay (HOSPITAL_COMMUNITY): Payer: Medicare Other

## 2014-05-19 DIAGNOSIS — I2510 Atherosclerotic heart disease of native coronary artery without angina pectoris: Secondary | ICD-10-CM

## 2014-05-19 DIAGNOSIS — Z7901 Long term (current) use of anticoagulants: Secondary | ICD-10-CM

## 2014-05-19 DIAGNOSIS — I10 Essential (primary) hypertension: Secondary | ICD-10-CM

## 2014-05-19 DIAGNOSIS — N189 Chronic kidney disease, unspecified: Secondary | ICD-10-CM

## 2014-05-19 DIAGNOSIS — I2584 Coronary atherosclerosis due to calcified coronary lesion: Secondary | ICD-10-CM

## 2014-05-19 DIAGNOSIS — E118 Type 2 diabetes mellitus with unspecified complications: Secondary | ICD-10-CM

## 2014-05-19 DIAGNOSIS — D638 Anemia in other chronic diseases classified elsewhere: Secondary | ICD-10-CM

## 2014-05-19 DIAGNOSIS — N179 Acute kidney failure, unspecified: Secondary | ICD-10-CM

## 2014-05-19 LAB — RETICULOCYTES
RBC.: 3.07 MIL/uL — ABNORMAL LOW (ref 4.22–5.81)
Retic Count, Absolute: 58.3 10*3/uL (ref 19.0–186.0)
Retic Ct Pct: 1.9 % (ref 0.4–3.1)

## 2014-05-19 LAB — BASIC METABOLIC PANEL
Anion gap: 11 (ref 5–15)
BUN: 39 mg/dL — ABNORMAL HIGH (ref 6–23)
CO2: 23 meq/L (ref 19–32)
Calcium: 8.5 mg/dL (ref 8.4–10.5)
Chloride: 107 mEq/L (ref 96–112)
Creatinine, Ser: 2.19 mg/dL — ABNORMAL HIGH (ref 0.50–1.35)
GFR calc non Af Amer: 26 mL/min — ABNORMAL LOW (ref >90)
GFR, EST AFRICAN AMERICAN: 30 mL/min — AB (ref >90)
GLUCOSE: 186 mg/dL — AB (ref 70–99)
POTASSIUM: 5.3 meq/L (ref 3.7–5.3)
Sodium: 141 mEq/L (ref 137–147)

## 2014-05-19 LAB — IRON AND TIBC
IRON: 17 ug/dL — AB (ref 42–135)
Saturation Ratios: 6 % — ABNORMAL LOW (ref 20–55)
TIBC: 270 ug/dL (ref 215–435)
UIBC: 253 ug/dL (ref 125–400)

## 2014-05-19 LAB — TROPONIN I: Troponin I: <0.3 ng/mL (ref <0.30)

## 2014-05-19 LAB — FERRITIN: Ferritin: 63 ng/mL (ref 22–322)

## 2014-05-19 LAB — CBC
HCT: 28.2 % — ABNORMAL LOW (ref 39.0–52.0)
Hemoglobin: 8.7 g/dL — ABNORMAL LOW (ref 13.0–17.0)
MCH: 28.2 pg (ref 26.0–34.0)
MCHC: 30.9 g/dL (ref 30.0–36.0)
MCV: 91.3 fL (ref 78.0–100.0)
Platelets: 229 10*3/uL (ref 150–400)
RBC: 3.09 MIL/uL — ABNORMAL LOW (ref 4.22–5.81)
RDW: 15 % (ref 11.5–15.5)
WBC: 7.1 10*3/uL (ref 4.0–10.5)

## 2014-05-19 LAB — GLUCOSE, CAPILLARY
GLUCOSE-CAPILLARY: 130 mg/dL — AB (ref 70–99)
GLUCOSE-CAPILLARY: 173 mg/dL — AB (ref 70–99)
Glucose-Capillary: 190 mg/dL — ABNORMAL HIGH (ref 70–99)
Glucose-Capillary: 186 mg/dL — ABNORMAL HIGH (ref 70–99)

## 2014-05-19 LAB — VITAMIN B12: Vitamin B-12: 1069 pg/mL — ABNORMAL HIGH (ref 211–911)

## 2014-05-19 LAB — FOLATE

## 2014-05-19 MED ORDER — DEXTROSE 5 % IV SOLN
500.0000 mg | INTRAVENOUS | Status: DC
  Administered 2014-05-19 – 2014-05-21 (×3): 500 mg via INTRAVENOUS
  Filled 2014-05-19 (×3): qty 500

## 2014-05-19 MED ORDER — CIPROFLOXACIN IN D5W 400 MG/200ML IV SOLN
INTRAVENOUS | Status: AC
  Filled 2014-05-19: qty 200

## 2014-05-19 MED ORDER — POLYETHYLENE GLYCOL 3350 17 G PO PACK
17.0000 g | PACK | Freq: Every day | ORAL | Status: DC
  Administered 2014-05-19: 17 g via ORAL
  Filled 2014-05-19: qty 1

## 2014-05-19 MED ORDER — POLYETHYLENE GLYCOL 3350 17 G PO PACK
17.0000 g | PACK | Freq: Every day | ORAL | Status: DC | PRN
  Filled 2014-05-19: qty 1

## 2014-05-19 MED ORDER — FLEET ENEMA 7-19 GM/118ML RE ENEM: 1.0000 | ENEMA | Freq: Every day | RECTAL | Status: DC | PRN

## 2014-05-19 NOTE — Plan of Care (Signed)
Problem: Phase II Progression Outcomes Goal: Obtain order to discontinue catheter if appropriate Outcome: Not Applicable Date Met:  05/19/14     

## 2014-05-19 NOTE — Progress Notes (Signed)
[facility]                                                                              Patient Demographics  [name] [name], is a 80 y.o. male, DOB - Mar 11, 1929, BJY:[id number]  Admit date - 05/18/2014   Admitting Physician Dorothea Ogle, MD  Outpatient Primary MD for the patient is [name], MD  LOS - 1   No chief complaint on file.     HPI on 05/18/2014 80 year old male with history of atrial fibrillation on our request, hypertension, CKD stage III, diabetes, presents emergency department with complaints of shortness of breath which started one week prior to admission however per wife, started approximately 3 weeks with slightly productive clear cough. Wife explained to the admitting physician that she helps her husband with daily enemas and that he has been more backed up and she has been having more difficulty getting the enemas over the past week. She had also taken her husband to the PCP as he had been confused. Dr. Hyacinth Meeker, PCP, requested chest x-ray which did not reveal anything specific for cardiopulmonary abnormality, the patient's blood work was noted for anemia. There was concern for GI bleed as patient is on now requests which was started by his cardiologist, Dr. Tresa Endo. Per wife as well as patient, no blood has been seen in stool and patient has been constipated. Patient did have a colonoscopy years ago however the results are unknown. Patient was directly admitted at the request of Dr. Hyacinth Meeker.  Assessment & Plan  Acute hypoxic respiratory failure -Appears to have improved -Upon admission, patient was noted to have 86% oxygen saturation on room air -At this time etiology possibly CHF versus pneumonia as chest x-ray: Interstitial densities concerning for fibrosis or scarring, left upper lobe density concerning for superimposed pneumonia -Pending repeat chest x-ray -Will continue supplemental oxygen to maintain his saturations above 92%  Elevated BNP -BNP upon  admission was 4183, however in the setting of CKD this is unreliable -Echocardiogram in May 2015 showed EF of 55-60%, study was not sufficient to allow for evaluation of LV diastolic function -Continue to monitor daily weights, intake and output  Possible community-acquired pneumonia -Chest x-ray: Possible left upper lobe pneumonia -Pending repeat chest x-ray -Will start patient on azithromycin along with ceftriaxone  Altered mental status -Noted to be confused at home per wife -At this time appears to be at baseline and able to answer all questions appropriately -Possibly secondary to UTI -Continue Rocephin, pending urine culture -PT consulted, pending evaluation -TSH 2.94  Diabetes mellitus, type II -Lantus currently held as patient CBGs upon admission were low -Continue insulin sliding scale with CBG monitoring -Hemoglobin A1c 6.5  Constipation -Patient administers daily enemas -continue home regimen  Acute on CKD, stage III  -Creatinine baseline appears to be 1.6 -Currently 2.19 -Will continue to monitor and avoid nephrotoxic agents  Anemia of chronic disease -Hemoglobin currently 8.7 -Patient appears to be hemodynamically stable -We'll transfuse if Hb drops < 7 -Pending anemia panel and occult  Atrial fibrillation -Continue amiodarone -Eliquis held due to anemia  Hypertension -Continue Norvasc and metoprolol  Coronary artery disease -Patient had CABG in 2006 -Currently chest pain-free -  Troponins negative 3 -Will continue statin, aspirin held due to anemia, rule out GI bleed  Code Status: Full  Family Communication: None at bedside  Disposition Plan: Admitted  Time Spent in minutes   30 minutes  Procedures  None  Consults   None  DVT Prophylaxis  SCDs  Lab Results  Component Value Date   PLT 229 05/19/2014    Medications  Scheduled Meds: . amiodarone  200 mg Oral QPM  . amLODipine  5 mg Oral Daily  . azithromycin  500 mg Intravenous  Q24H  . cefTRIAXone (ROCEPHIN)  IV  1 g Intravenous Q24H  . doxazosin  2 mg Oral QHS  . ezetimibe  10 mg Oral Daily  . gabapentin  300 mg Oral Q1400  . gabapentin  600 mg Oral BID  . insulin aspart  0-9 Units Subcutaneous TID WC  . metoprolol  50 mg Oral BID  . oxyCODONE-acetaminophen  2 tablet Oral BID  . polyethylene glycol  17 g Oral Daily  . sodium chloride  3 mL Intravenous Q12H  . sodium phosphate  1 enema Rectal Daily   Continuous Infusions:  PRN Meds:.ondansetron **OR** ondansetron (ZOFRAN) IV, oxyCODONE-acetaminophen  Antibiotics    Anti-infectives    Start     Dose/Rate Route Frequency Ordered Stop   05/19/14 0800  azithromycin (ZITHROMAX) 500 mg in dextrose 5 % 250 mL IVPB     500 mg250 mL/hr over 60 Minutes Intravenous Every 24 hours 05/19/14 0656     05/18/14 2000  cefTRIAXone (ROCEPHIN) 1 g in dextrose 5 % 50 mL IVPB - Premix     1 g100 mL/hr over 30 Minutes Intravenous Every 24 hours 05/18/14 1805          Subjective:   [name] [name] seen and examined today.  Patient states he is feeling better although continues to have cough. Denies any chest pain or palpitations, abdominal pain, nausea or vomiting.  Objective:   Filed Vitals:   05/19/14 0625 05/19/14 0852 05/19/14 1027 05/19/14 1028  BP: 159/68 99/50 105/52   Pulse: 83 79  74  Temp: 99 F (37.2 C) 98.2 F (36.8 C)    TempSrc: Oral Oral    Resp: 18 18    Height:      Weight: 78 kg (171 lb 15.3 oz)     SpO2: 93% 92%      Wt Readings from Last 3 Encounters:  05/19/14 78 kg (171 lb 15.3 oz)  04/19/14 79.379 kg (175 lb)  12/16/13 77.202 kg (170 lb 3.2 oz)     Intake/Output Summary (Last 24 hours) at 05/19/14 1203 Last data filed at 05/19/14 0700  Gross per 24 hour  Intake    533 ml  Output   1400 ml  Net   -867 ml    Exam  General: Well developed, well nourished, NAD, appears stated age  HEENT: NCAT, mucous membranes moist.   Cardiovascular: S1 S2 auscultated,  irregular  Respiratory: Clear to auscultation bilaterally with equal chest rise  Abdomen: Soft, nontender, nondistended, + bowel sounds  Extremities: warm dry without cyanosis clubbing or edema  Neuro: AAOx3, no focal deficits  Skin: Without rashes exudates or nodules  Psych: Appropriate mood and affect  Data Review   Micro Results No results found for this or any previous visit (from the past 240 hour(s)).  Radiology Reports Dg Chest 2 View  05/19/2014   CLINICAL DATA:  Hypoxia and history of atrial fibrillation. Hypertension.  EXAM: CHEST  2  VIEW  COMPARISON:  05/18/2014  FINDINGS: The patient is status post median sternotomy and CABG procedure. There is moderate cardiac enlargement. Calcified plaque involves the thoracic aorta. Diffuse, chronic reticular interstitial abnormality is identified. No superimposed airspace consolidation. No pleural effusion or edema.  IMPRESSION: 1. Diffuse abnormal interstitial reticulation is again identified and is concerning for advanced interstitial lung disease. Consider further evaluation with contrast enhanced high-resolution CT of the chest to assess for interstitial lung disease.   Electronically Signed   By: Signa Kellaylor  Stroud M.D.   On: 05/19/2014 11:37   Dg Chest 2 View  05/18/2014   CLINICAL DATA:  Dyspnea.  EXAM: CHEST  2 VIEW  COMPARISON:  May 16, 2014.  FINDINGS: Stable cardiomediastinal silhouette. Status post coronary artery bypass graft. No pneumothorax or significant pleural effusion is noted. Interstitial densities are noted throughout both lungs most consistent with scarring or fibrosis. Increased opacity is noted laterally in the left upper lobe suggesting superimposed pneumonia. Bony thorax is intact.  IMPRESSION: Interstitial densities are noted throughout both lungs concerning for fibrosis or scarring. Increased density is noted laterally in left upper lobe concerning for superimposed pneumonia. Followup radiographs are recommended  until resolution.   Electronically Signed   By: Roque LiasJames  Green M.D.   On: 05/18/2014 21:31    CBC  Recent Labs Lab 05/18/14 1410 05/19/14 0144  WBC 9.3 7.1  HGB 9.0* 8.7*  HCT 28.5* 28.2*  PLT 225 229  MCV 91.1 91.3  MCH 28.8 28.2  MCHC 31.6 30.9  RDW 15.1 15.0    Chemistries   Recent Labs Lab 05/18/14 1410 05/19/14 0144  NA 141 141  K 5.3 5.3  CL 108 107  CO2 23 23  GLUCOSE 60* 186*  BUN 38* 39*  CREATININE 2.06* 2.19*  CALCIUM 9.1 8.5  MG 2.7*  --   AST 14  --   ALT 9  --   ALKPHOS 71  --   BILITOT 0.5  --    ------------------------------------------------------------------------------------------------------------------ estimated creatinine clearance is 28.1 mL/min (by C-G formula based on Cr of 2.19). ------------------------------------------------------------------------------------------------------------------  Recent Labs  05/18/14 1410  HGBA1C 6.5*   ------------------------------------------------------------------------------------------------------------------ No results for input(s): CHOL, HDL, LDLCALC, TRIG, CHOLHDL, LDLDIRECT in the last 72 hours. ------------------------------------------------------------------------------------------------------------------  Recent Labs  05/18/14 1410  TSH 2.940   ------------------------------------------------------------------------------------------------------------------  Recent Labs  05/19/14 0802  RETICCTPCT 1.9    Coagulation profile  Recent Labs Lab 05/18/14 1410  INR 1.26    No results for input(s): DDIMER in the last 72 hours.  Cardiac Enzymes  Recent Labs Lab 05/18/14 1410 05/18/14 2000 05/19/14 0144  TROPONINI <0.30 <0.30 <0.30   ------------------------------------------------------------------------------------------------------------------ Invalid input(s): POCBNP    Haidyn Chadderdon D.O. on 05/19/2014 at 12:03 PM  Between 7am to 7pm - Pager -  [phone]  After 7pm go to [email or web address] - password [email or web address]  And look for the night coverage person covering for me after hours  [facility] Office  [phone]

## 2014-05-19 NOTE — Telephone Encounter (Signed)
Dr. Tresa EndoKelly spoke with Dr. Hyacinth MeekerMiller over the phone after receiving overhead page.

## 2014-05-19 NOTE — Progress Notes (Signed)
Inpatient Diabetes Program Recommendations  AACE/ADA: New Consensus Statement on Inpatient Glycemic Control (2013)  Target Ranges:  Prepandial:   less than 140 mg/dL      Peak postprandial:   less than 180 mg/dL (1-2 hours)      Critically ill patients:  140 - 180 mg/dL   Reason for Visit: Hypo and Hyperglycemia  Diabetes history: DM2 Outpatient Diabetes medications: Lantus 20 units QAM Current orders for Inpatient glycemic control: Novolog moderate tidwc  Results for [name], [name] [name] (MRN [id number]) as of 05/19/2014 14:13  Ref. Range 05/19/2014 08:11 05/19/2014 11:32  Glucose-Capillary Latest Range: 70-99 mg/dL 409190 (H) 811186 (H)  Results for [name], [name] [name] (MRN [id number]) as of 05/19/2014 14:13  Ref. Range 05/18/2014 14:10 05/18/2014 20:00 05/19/2014 01:44  Sodium Latest Range: 137-147 mEq/L 141  141  Potassium Latest Range: 3.7-5.3 mEq/L 5.3  5.3  Chloride Latest Range: 96-112 mEq/L 108  107  CO2 Latest Range: 19-32 mEq/L 23  23  Mean Plasma Glucose Latest Range: <117 mg/dL 213140 (H)    BUN Latest Range: 6-23 mg/dL 38 (H)  39 (H)  Creatinine Latest Range: 0.50-1.35 mg/dL 0.862.06 (H)  5.782.19 (H)  Calcium Latest Range: 8.4-10.5 mg/dL 9.1  8.5  GFR calc non Af Amer Latest Range: >90 mL/min 28 (L)  26 (L)  GFR calc Af Amer Latest Range: >90 mL/min 33 (L)  30 (L)  Glucose Latest Range: 70-99 mg/dL 60 (L)  469186 (H)  Hypoglycemia on admission.   Inpatient Diabetes Program Recommendations Insulin - Basal: Add 1/2 Lantus home dose - 10 units QAM HgbA1C: 6.5% - good control  Note: Will follow. Thank you. Ailene Ardshonda Zaxton Angerer, RD, LDN, CDE Inpatient Diabetes Coordinator [phone]

## 2014-05-19 NOTE — Progress Notes (Signed)
PT Cancellation Note/Screen Note  Patient Details Name: [name] [name] MRN: [id number] DOB: 05/09/1929   Cancelled Treatment:    Reason Eval/Treat Not Completed: PT screened, no needs identified, will sign off. Spoke with pt and wife. Pt outright refuses therapy services (both PT and OT). Wife present-states she feels she can manage with pt at home. Will sign off. Thanks.    Rebeca AlertJannie Annetta Deiss, MPT Pager: [phone]

## 2014-05-19 NOTE — Plan of Care (Signed)
Problem: Phase II Progression Outcomes Goal: Vital signs remain stable Outcome: Completed/Met Date Met:  05/19/14 Goal: IV changed to normal saline lock Outcome: Completed/Met Date Met:  05/19/14

## 2014-05-20 ENCOUNTER — Other Ambulatory Visit: Payer: Medicare Other

## 2014-05-20 LAB — CBC
HEMATOCRIT: 27.8 % — AB (ref 39.0–52.0)
Hemoglobin: 8.6 g/dL — ABNORMAL LOW (ref 13.0–17.0)
MCH: 28.4 pg (ref 26.0–34.0)
MCHC: 30.9 g/dL (ref 30.0–36.0)
MCV: 91.7 fL (ref 78.0–100.0)
Platelets: 218 10*3/uL (ref 150–400)
RBC: 3.03 MIL/uL — AB (ref 4.22–5.81)
RDW: 14.8 % (ref 11.5–15.5)
WBC: 7.5 10*3/uL (ref 4.0–10.5)

## 2014-05-20 LAB — BASIC METABOLIC PANEL
ANION GAP: 13 (ref 5–15)
BUN: 43 mg/dL — ABNORMAL HIGH (ref 6–23)
CALCIUM: 8.6 mg/dL (ref 8.4–10.5)
CO2: 22 mEq/L (ref 19–32)
Chloride: 103 mEq/L (ref 96–112)
Creatinine, Ser: 2.17 mg/dL — ABNORMAL HIGH (ref 0.50–1.35)
GFR calc Af Amer: 31 mL/min — ABNORMAL LOW (ref >90)
GFR calc non Af Amer: 26 mL/min — ABNORMAL LOW (ref >90)
GLUCOSE: 128 mg/dL — AB (ref 70–99)
POTASSIUM: 4.9 meq/L (ref 3.7–5.3)
SODIUM: 138 meq/L (ref 137–147)

## 2014-05-20 LAB — GLUCOSE, CAPILLARY
GLUCOSE-CAPILLARY: 357 mg/dL — AB (ref 70–99)
Glucose-Capillary: 111 mg/dL — ABNORMAL HIGH (ref 70–99)
Glucose-Capillary: 169 mg/dL — ABNORMAL HIGH (ref 70–99)
Glucose-Capillary: 86 mg/dL (ref 70–99)

## 2014-05-20 LAB — URINE CULTURE: Colony Count: >=100000

## 2014-05-20 MED ORDER — FERROUS SULFATE 325 (65 FE) MG PO TABS
325.0000 mg | ORAL_TABLET | Freq: Every day | ORAL | Status: DC
  Administered 2014-05-20 – 2014-05-21 (×2): 325 mg via ORAL
  Filled 2014-05-20 (×3): qty 1

## 2014-05-20 NOTE — Progress Notes (Signed)
[facility]                                                                              Patient Demographics  [name] [name], is a 80 y.o. male, DOB - 11/16/1928, ZOX:[id number]  Admit date - 05/18/2014   Admitting Physician Dorothea OgleIskra M Myers, MD  Outpatient Primary MD for the patient is [name],[name], MD  LOS - 2   No chief complaint on file.     HPI on 05/18/2014 80 year old male with history of atrial fibrillation on our request, hypertension, CKD stage III, diabetes, presents emergency department with complaints of shortness of breath which started one week prior to admission however per wife, started approximately 3 weeks with slightly productive clear cough. Wife explained to the admitting physician that she helps her husband with daily enemas and that he has been more backed up and she has been having more difficulty getting the enemas over the past week. She had also taken her husband to the PCP as he had been confused. Dr. Hyacinth MeekerMiller, PCP, requested chest x-ray which did not reveal anything specific for cardiopulmonary abnormality, the patient's blood work was noted for anemia. There was concern for GI bleed as patient is on now requests which was started by his cardiologist, Dr. Tresa EndoKelly. Per wife as well as patient, no blood has been seen in stool and patient has been constipated. Patient did have a colonoscopy years ago however the results are unknown. Patient was directly admitted at the request of Dr. Hyacinth MeekerMiller.  Assessment & Plan  Acute hypoxic respiratory failure -Appears to have improved -Upon admission, patient was noted to have 86% oxygen saturation on room air -Will wean off of O2. -Possible etiology:  CHF versus pneumonia as chest x-ray: Interstitial densities concerning for fibrosis or scarring, left upper lobe density concerning for superimposed pneumonia -Repeat CXR 11/19: Diffuse interstitial reticulation concerning for advanced interstitial lung disease, consider CT  with contrast  -Cannot obtain CT with contrast due to CKD  Elevated BNP -BNP upon admission was 4183, however in the setting of CKD this is unreliable -Echocardiogram in May 2015 showed EF of 55-60%, study was not sufficient to allow for evaluation of LV diastolic function -Continue to monitor daily weights, intake and output -Urine output in the past 24 hours 1420  Possible community-acquired pneumonia -Chest x-ray: Possible left upper lobe pneumonia -Repeat as mentioned above -Continue azithromycin and ceftriaxone  Altered mental status -Noted to be confused at home per wife -Spoke with wife via phone, patient is at his baseline, he has confusion at baseline -Possibly secondary to UTI vs pna -Continue Rocephin -Urine culture: >100K GNR, pending organism and sensitivities  -PT consulted and signed off, patient does not require any physical therapy needs -TSH 2.94  Diabetes mellitus, type II -Lantus currently held as patient CBGs upon admission were low -Continue insulin sliding scale with CBG monitoring -Hemoglobin A1c 6.5  Constipation -Patient administers daily enemas -continue home regimen, may relax and enemas when necessary  Acute on CKD, stage III  -Creatinine baseline appears to be 1.6 -Currently 2.17 -Will continue to monitor and avoid nephrotoxic agents  Anemia of chronic disease -Hemoglobin currently 8.6 -Patient appears to be hemodynamically  stable -We'll transfuse if Hb drops < 7 -Pending occult -Anemia panel shows iron of 17, ferritin 63, will begin iron supplementation, however this may increase his constipation, will only start daily. -Unlikely GI bleed as patient currently remains hemodynamically stable and hemoglobin remained stable.   Atrial fibrillation -Continue amiodarone -Eliquis held due to anemia  Hypertension -Continue Norvasc and metoprolol  Coronary artery disease -Patient had CABG in 2006 -Currently chest pain-free -Troponins  negative 3 -Will continue statin, aspirin held due to anemia  Code Status: Full  Family Communication: None at bedside, wife via phone  Disposition Plan: Admitted  Time Spent in minutes   30 minutes  Procedures  None  Consults   None  DVT Prophylaxis  SCDs  Lab Results  Component Value Date   PLT 218 05/20/2014    Medications  Scheduled Meds: . amiodarone  200 mg Oral QPM  . amLODipine  5 mg Oral Daily  . azithromycin  500 mg Intravenous Q24H  . cefTRIAXone (ROCEPHIN)  IV  1 g Intravenous Q24H  . doxazosin  2 mg Oral QHS  . ezetimibe  10 mg Oral Daily  . ferrous sulfate  325 mg Oral Q breakfast  . gabapentin  300 mg Oral Q1400  . gabapentin  600 mg Oral BID  . insulin aspart  0-9 Units Subcutaneous TID WC  . metoprolol  50 mg Oral BID  . oxyCODONE-acetaminophen  2 tablet Oral BID  . sodium chloride  3 mL Intravenous Q12H   Continuous Infusions:  PRN Meds:.ondansetron **OR** ondansetron (ZOFRAN) IV, oxyCODONE-acetaminophen, polyethylene glycol, sodium phosphate  Antibiotics    Anti-infectives    Start     Dose/Rate Route Frequency Ordered Stop   05/19/14 0800  azithromycin (ZITHROMAX) 500 mg in dextrose 5 % 250 mL IVPB     500 mg250 mL/hr over 60 Minutes Intravenous Every 24 hours 05/19/14 0656     05/18/14 2000  cefTRIAXone (ROCEPHIN) 1 g in dextrose 5 % 50 mL IVPB - Premix     1 g100 mL/hr over 30 Minutes Intravenous Every 24 hours 05/18/14 1805          Subjective:   [name] [name] seen and examined today.  Patient states his breathing and cough have improved. He currently denies any chest pain, palpitations, abdominal pain.  Objective:   Filed Vitals:   05/19/14 1028 05/19/14 1425 05/19/14 2139 05/20/14 0616  BP:  114/68 146/49 135/45  Pulse: 74 74 68 59  Temp:  97.9 F (36.6 C) 99.7 F (37.6 C) 98.5 F (36.9 C)  TempSrc:  Oral Oral Oral  Resp:  18 20 20   Height:      Weight:    79 kg (174 lb 2.6 oz)  SpO2:  95% 92% 96%    Wt Readings  from Last 3 Encounters:  05/20/14 79 kg (174 lb 2.6 oz)  04/19/14 79.379 kg (175 lb)  12/16/13 77.202 kg (170 lb 3.2 oz)     Intake/Output Summary (Last 24 hours) at 05/20/14 1119 Last data filed at 05/20/14 0900  Gross per 24 hour  Intake    533 ml  Output   1420 ml  Net   -887 ml    Exam  General: Well developed, well nourished, NAD, appears stated age  HEENT: NCAT, mucous membranes moist.   Cardiovascular: S1 S2 auscultated, irregular  Respiratory: Clear to auscultation bilaterally with equal chest rise  Abdomen: Soft, nontender, nondistended, + bowel sounds  Extremities: warm dry without cyanosis clubbing or  edema  Neuro: AAOx3, no focal deficits  Skin: Without rashes exudates or nodules  Psych: Appropriate mood and affect  Data Review   Micro Results Recent Results (from the past 240 hour(s))  Urine culture     Status: None (Preliminary result)   Collection Time: 05/18/14  4:30 PM  Result Value Ref Range Status   Specimen Description URINE, CATHETERIZED  Final   Special Requests NONE  Final   Culture  Setup Time   Final    05/18/2014 20:41 Performed at [facility]    Colony Count   Final    >=100,000 COLONIES/ML Performed at [facility]    Culture   Final    GRAM NEGATIVE RODS Performed at [facility]    Report Status PENDING  Incomplete    Radiology Reports Dg Chest 2 View  05/19/2014   CLINICAL DATA:  Hypoxia and history of atrial fibrillation. Hypertension.  EXAM: CHEST  2 VIEW  COMPARISON:  05/18/2014  FINDINGS: The patient is status post median sternotomy and CABG procedure. There is moderate cardiac enlargement. Calcified plaque involves the thoracic aorta. Diffuse, chronic reticular interstitial abnormality is identified. No superimposed airspace consolidation. No pleural effusion or edema.  IMPRESSION: 1. Diffuse abnormal interstitial reticulation is again identified and is concerning for advanced interstitial lung  disease. Consider further evaluation with contrast enhanced high-resolution CT of the chest to assess for interstitial lung disease.   Electronically Signed   By: Signa Kellaylor  Stroud M.D.   On: 05/19/2014 11:37   Dg Chest 2 View  05/18/2014   CLINICAL DATA:  Dyspnea.  EXAM: CHEST  2 VIEW  COMPARISON:  May 16, 2014.  FINDINGS: Stable cardiomediastinal silhouette. Status post coronary artery bypass graft. No pneumothorax or significant pleural effusion is noted. Interstitial densities are noted throughout both lungs most consistent with scarring or fibrosis. Increased opacity is noted laterally in the left upper lobe suggesting superimposed pneumonia. Bony thorax is intact.  IMPRESSION: Interstitial densities are noted throughout both lungs concerning for fibrosis or scarring. Increased density is noted laterally in left upper lobe concerning for superimposed pneumonia. Followup radiographs are recommended until resolution.   Electronically Signed   By: Roque LiasJames  Green M.D.   On: 05/18/2014 21:31    CBC  Recent Labs Lab 05/18/14 1410 05/19/14 0144 05/20/14 0515  WBC 9.3 7.1 7.5  HGB 9.0* 8.7* 8.6*  HCT 28.5* 28.2* 27.8*  PLT 225 229 218  MCV 91.1 91.3 91.7  MCH 28.8 28.2 28.4  MCHC 31.6 30.9 30.9  RDW 15.1 15.0 14.8    Chemistries   Recent Labs Lab 05/18/14 1410 05/19/14 0144 05/20/14 0515  NA 141 141 138  K 5.3 5.3 4.9  CL 108 107 103  CO2 23 23 22   GLUCOSE 60* 186* 128*  BUN 38* 39* 43*  CREATININE 2.06* 2.19* 2.17*  CALCIUM 9.1 8.5 8.6  MG 2.7*  --   --   AST 14  --   --   ALT 9  --   --   ALKPHOS 71  --   --   BILITOT 0.5  --   --    ------------------------------------------------------------------------------------------------------------------ estimated creatinine clearance is 28.3 mL/min (by C-G formula based on Cr of 2.17). ------------------------------------------------------------------------------------------------------------------  Recent Labs   05/18/14 1410  HGBA1C 6.5*   ------------------------------------------------------------------------------------------------------------------ No results for input(s): CHOL, HDL, LDLCALC, TRIG, CHOLHDL, LDLDIRECT in the last 72 hours. ------------------------------------------------------------------------------------------------------------------  Recent Labs  05/18/14 1410  TSH 2.940   ------------------------------------------------------------------------------------------------------------------  Recent  Labs  05/19/14 0802  VITAMINB12 1069*  FOLATE >20.0  FERRITIN 63  TIBC 270  IRON 17*  RETICCTPCT 1.9    Coagulation profile  Recent Labs Lab 05/18/14 1410  INR 1.26    No results for input(s): DDIMER in the last 72 hours.  Cardiac Enzymes  Recent Labs Lab 05/18/14 1410 05/18/14 2000 05/19/14 0144  TROPONINI <0.30 <0.30 <0.30   ------------------------------------------------------------------------------------------------------------------ Invalid input(s): POCBNP    Travarus Trudo D.O. on 05/20/2014 at 11:19 AM  Between 7am to 7pm - Pager - [phone]  After 7pm go to [email or web address] - password [email or web address]  And look for the night coverage person covering for me after hours  [facility] Office  [phone]

## 2014-05-21 LAB — BASIC METABOLIC PANEL
Anion gap: 9 (ref 5–15)
BUN: 40 mg/dL — ABNORMAL HIGH (ref 6–23)
CHLORIDE: 104 meq/L (ref 96–112)
CO2: 25 mEq/L (ref 19–32)
Calcium: 8.9 mg/dL (ref 8.4–10.5)
Creatinine, Ser: 2.11 mg/dL — ABNORMAL HIGH (ref 0.50–1.35)
GFR calc Af Amer: 32 mL/min — ABNORMAL LOW (ref >90)
GFR calc non Af Amer: 27 mL/min — ABNORMAL LOW (ref >90)
GLUCOSE: 163 mg/dL — AB (ref 70–99)
POTASSIUM: 5.3 meq/L (ref 3.7–5.3)
Sodium: 138 mEq/L (ref 137–147)

## 2014-05-21 LAB — GLUCOSE, CAPILLARY
Glucose-Capillary: 142 mg/dL — ABNORMAL HIGH (ref 70–99)
Glucose-Capillary: 317 mg/dL — ABNORMAL HIGH (ref 70–99)

## 2014-05-21 LAB — CBC
HEMATOCRIT: 28.3 % — AB (ref 39.0–52.0)
HEMOGLOBIN: 8.9 g/dL — AB (ref 13.0–17.0)
MCH: 28.7 pg (ref 26.0–34.0)
MCHC: 31.4 g/dL (ref 30.0–36.0)
MCV: 91.3 fL (ref 78.0–100.0)
Platelets: 226 10*3/uL (ref 150–400)
RBC: 3.1 MIL/uL — ABNORMAL LOW (ref 4.22–5.81)
RDW: 14.9 % (ref 11.5–15.5)
WBC: 6.6 10*3/uL (ref 4.0–10.5)

## 2014-05-21 MED ORDER — LEVOFLOXACIN 500 MG PO TABS: 500.0000 mg | ORAL_TABLET | ORAL | Status: DC

## 2014-05-21 MED ORDER — FERROUS SULFATE 325 (65 FE) MG PO TABS: 325.0000 mg | ORAL_TABLET | Freq: Every day | ORAL | Status: AC

## 2014-05-21 NOTE — Discharge Summary (Addendum)
Physician Discharge Summary  [name] [name] ZOX:[id number] DOB: 12/20/1928 DOA: 05/18/2014  PCP: [name],[name], MD  Admit date: 05/18/2014 Discharge date: 05/21/2014  Time spent: 45 minutes  Recommendations for Outpatient Follow-up:  Patient will be discharged home. He is to follow-up with his primary care physician within one week of discharge.  Patient should continue seeing his urologist and may want to follow up with a nephrologist. Patient should resume his medications as prescribed. Patient also have a repeat CBC as well as BMP within 1 week of discharge. Patient may resume normal activities tolerated. Patient should follow a heart healthy/carb modified diet.   Discharge Diagnoses:  Acute hypoxic respiratory failure Elevated BNP Possible community acquired pneumonia Altered mental status Diabetes mellitus, type II Constipation Acute on chronic kidney disease, stage III-IV Anemia of chronic disease Atrial fibrillation excellent hypertension Coronary artery disease  Discharge Condition: Stable  Diet recommendation: Heart healthy/carb modified  Filed Weights   05/19/14 0625 05/20/14 0616 05/21/14 0500  Weight: 78 kg (171 lb 15.3 oz) 79 kg (174 lb 2.6 oz) 80.9 kg (178 lb 5.6 oz)    History of present illness:  on 05/18/2014 80 year old male with history of atrial fibrillation on our request, hypertension, CKD stage III, diabetes, presents emergency department with complaints of shortness of breath which started one week prior to admission however per wife, started approximately 3 weeks with slightly productive clear cough. Wife explained to the admitting physician that she helps her husband with daily enemas and that he has been more backed up and she has been having more difficulty getting the enemas over the past week. She had also taken her husband to the PCP as he had been confused. Dr. Hyacinth MeekerMiller, PCP, requested chest x-ray which did not reveal anything specific for  cardiopulmonary abnormality, the patient's blood work was noted for anemia. There was concern for GI bleed as patient is on now requests which was started by his cardiologist, Dr. Tresa EndoKelly. Per wife as well as patient, no blood has been seen in stool and patient has been constipated. Patient did have a colonoscopy years ago however the results are unknown. Patient was directly admitted at the request of Dr. Hyacinth MeekerMiller.  Hospital Course:  Acute hypoxic respiratory failure -Appears to have improved -Upon admission, patient was noted to have 86% oxygen saturation on room air -Will wean off of O2.  Patient maintained O2sats above 92% on RA -Possible etiology: CHF versus pneumonia as chest x-ray: Interstitial densities concerning for fibrosis or scarring, left upper lobe density concerning for superimposed pneumonia -Repeat CXR 11/19: Diffuse interstitial reticulation concerning for advanced interstitial lung disease, consider CT with contrast  -Cannot obtain CT with contrast due to CKD  Elevated BNP -BNP upon admission was 4183, however in the setting of CKD this is unreliable -Echocardiogram in May 2015 showed EF of 55-60%, study was not sufficient to allow for evaluation of LV diastolic function -Continue to monitor daily weights, intake and output -Urine output in the past 24 hours 1975  Possible community-acquired pneumonia -Chest x-ray: Possible left upper lobe pneumonia -Repeat as mentioned above -Initially place don azithromycin and ceftriaxone -Will discharge with levaquin  Altered mental status -Noted to be confused at home per wife -Spoke with wife via phone, patient is at his baseline, he has confusion at baseline -Possibly secondary to UTI vs pna -Was placed on Rocephin but will be discharged with levaquin -Urine culture: >100K, Enterobacter aerogenes -PT consulted and signed off, patient does not require any physical therapy needs -  TSH 2.94  Diabetes mellitus, type II -Lantus  currently held as patient CBGs upon admission were low -Was placed on insulin sliding scale with CBG monitoring during hospitalization -May resume home regimen at discharge -Hemoglobin A1c 6.5  Constipation -Patient administers daily enemas -continue home regimen, miralax and enemas when necessary  ? Acute on CKD, stage III-IV  -Currently 2.11 (possibly new baseline) -Will continue to monitor and avoid nephrotoxic agents -Recommended patient followup nephrologist at discharge  Anemia of chronic disease -Patient appears to be hemodynamically stable, hemoglobin 8.9 -Anemia panel shows iron of 17, ferritin 63, will begin iron supplementation, however this may increase his constipation -Continue iron supplementation -Unlikely GI bleed as patient currently remains hemodynamically stable and hemoglobin remained stable.   Atrial fibrillation -Continue amiodarone -Eliquis held due to anemia however, Hb remained stable, may continue at discharge.  Hypertension -Continue Norvasc and metoprolol  Coronary artery disease -Patient had CABG in 2006 -Currently chest pain-free -Troponins negative 3 -Will continue statin, aspirin held due to anemia, however, remained hb stable  Procedures: None  Consultations: None  Discharge Exam: Filed Vitals:   05/21/14 0612  BP: 142/63  Pulse: 64  Temp: 97.4 F (36.3 C)  Resp: 18     General: Well developed, well nourished, NAD, appears stated age  HEENT: NCAT,  mucous membranes moist.  Cardiovascular: S1 S2 auscultated, irregular  Respiratory: Clear to auscultation bilaterally with equal chest rise  Abdomen: Soft, nontender, nondistended, + bowel sounds  Extremities: warm dry without cyanosis clubbing or edema  Neuro: AAOx3, no focal deficits  Psych: appropriate mood and affect   Discharge Instructions      Discharge Instructions    Discharge instructions    Complete by:  As directed   Patient will be discharged home. He  is to follow-up with his primary care physician within one week of discharge.  Patient should continue seeing his urologist and may want to follow up with a nephrologist. Patient should resume his medications as prescribed. Patient also have a repeat CBC as well as BMP within 1 week of discharge. Patient may resume normal activities tolerated. Patient should follow a heart healthy/carb modified diet.            Medication List    TAKE these medications        albuterol 108 (90 BASE) MCG/ACT inhaler  Commonly known as:  PROVENTIL HFA;VENTOLIN HFA  Inhale 1-2 puffs into the lungs every 6 (six) hours as needed for wheezing or shortness of breath.     amiodarone 200 MG tablet  Commonly known as:  PACERONE  Take 1 tablet (200 mg total) by mouth daily.     amLODipine 5 MG tablet  Commonly known as:  NORVASC  Take 1 tablet (5 mg total) by mouth daily. Takes 1/2 tablet daily     apixaban 2.5 MG Tabs tablet  Commonly known as:  ELIQUIS  Take 1 tablet (2.5 mg total) by mouth 2 (two) times daily.     aspirin 81 MG tablet  Take 81 mg by mouth daily with breakfast.     calcium carbonate 1250 MG tablet  Commonly known as:  OS-CAL - dosed in mg of elemental calcium  Take 2 tablets by mouth 2 (two) times daily with a meal.     doxazosin 2 MG tablet  Commonly known as:  CARDURA  Take 1.5 tablets (3 mg total) by mouth at bedtime.     ezetimibe 10 MG tablet  Commonly known as:  ZETIA  Take 1 tablet (10 mg total) by mouth daily.     ferrous sulfate 325 (65 FE) MG tablet  Take 1 tablet (325 mg total) by mouth daily with breakfast.     FISH OIL PO  Take 1 capsule by mouth 2 (two) times daily.     gabapentin 300 MG capsule  Commonly known as:  NEURONTIN  Take 300-600 mg by mouth See admin instructions. 2 tabs every morning, 1 tab in the afternoon and 2 tabs at bedtime     insulin glargine 100 UNIT/ML injection  Commonly known as:  LANTUS  Inject 20 Units into the skin daily with  breakfast.     levofloxacin 500 MG tablet  Commonly known as:  LEVAQUIN  Take 1 tablet (500 mg total) by mouth every other day.     metoprolol 50 MG tablet  Commonly known as:  LOPRESSOR  Take 50 mg by mouth 2 (two) times daily.     MILK THISTLE PO  Take 1 tablet by mouth daily with breakfast.     multivitamin with minerals Tabs tablet  Take 1 tablet by mouth daily.     oxyCODONE-acetaminophen 5-325 MG per tablet  Commonly known as:  PERCOCET/ROXICET  Take 1-2 tablets by mouth See admin instructions. 2 tabs every morning, 1 tab in the afternoon and 2 tabs at bedtime     PROBIOTIC DAILY Caps  Take 1 capsule by mouth every evening.       Allergies  Allergen Reactions  . Cymbalta [Duloxetine Hcl] Other (See Comments)    Confused, paralyzed from waist up   . Statins Other (See Comments)    Leg pains   Follow-up Information    Follow up with [name], MD. Schedule an appointment as soon as possible for a visit in 1 week.   Specialty:  Family Medicine   Why:  Hospital followup, Repeat CBC and BMP   Contact information:   [address] [place] [address] [phone]       Follow up with [facility]. Schedule an appointment as soon as possible for a visit in 1 week.   Why:  Establish care, kidney function   Contact information:   [address] [place] [address] [phone]        The results of significant diagnostics from this hospitalization (including imaging, microbiology, ancillary and laboratory) are listed below for reference.    Significant Diagnostic Studies: Dg Chest 2 View  05/19/2014   CLINICAL DATA:  Hypoxia and history of atrial fibrillation. Hypertension.  EXAM: CHEST  2 VIEW  COMPARISON:  05/18/2014  FINDINGS: The patient is status post median sternotomy and CABG procedure. There is moderate cardiac enlargement. Calcified plaque involves the thoracic aorta. Diffuse, chronic reticular interstitial abnormality is identified. No  superimposed airspace consolidation. No pleural effusion or edema.  IMPRESSION: 1. Diffuse abnormal interstitial reticulation is again identified and is concerning for advanced interstitial lung disease. Consider further evaluation with contrast enhanced high-resolution CT of the chest to assess for interstitial lung disease.   Electronically Signed   By: Signa Kell M.D.   On: 05/19/2014 11:37   Dg Chest 2 View  05/18/2014   CLINICAL DATA:  Dyspnea.  EXAM: CHEST  2 VIEW  COMPARISON:  May 16, 2014.  FINDINGS: Stable cardiomediastinal silhouette. Status post coronary artery bypass graft. No pneumothorax or significant pleural effusion is noted. Interstitial densities are noted throughout both lungs most consistent with scarring or fibrosis. Increased opacity is  noted laterally in the left upper lobe suggesting superimposed pneumonia. Bony thorax is intact.  IMPRESSION: Interstitial densities are noted throughout both lungs concerning for fibrosis or scarring. Increased density is noted laterally in left upper lobe concerning for superimposed pneumonia. Followup radiographs are recommended until resolution.   Electronically Signed   By: Roque Lias M.D.   On: 05/18/2014 21:31    Microbiology: Recent Results (from the past 240 hour(s))  Urine culture     Status: None   Collection Time: 05/18/14  4:30 PM  Result Value Ref Range Status   Specimen Description URINE, CATHETERIZED  Final   Special Requests NONE  Final   Culture  Setup Time   Final    05/18/2014 20:41 Performed at [facility] Count   Final    >=100,000 COLONIES/ML Performed at [facility]    Culture   Final    ENTEROBACTER AEROGENES Performed at [facility]    Report Status 05/20/2014 FINAL  Final   Organism ID, Bacteria ENTEROBACTER AEROGENES  Final      Susceptibility   Enterobacter aerogenes - MIC*    CEFAZOLIN >=64 RESISTANT Resistant     CEFTRIAXONE <=1 SENSITIVE Sensitive      CIPROFLOXACIN 1 SENSITIVE Sensitive     GENTAMICIN <=1 SENSITIVE Sensitive     LEVOFLOXACIN 1 SENSITIVE Sensitive     NITROFURANTOIN 64 INTERMEDIATE Intermediate     TOBRAMYCIN <=1 SENSITIVE Sensitive     TRIMETH/SULFA <=20 SENSITIVE Sensitive     PIP/TAZO 8 SENSITIVE Sensitive     * ENTEROBACTER AEROGENES     Labs: Basic Metabolic Panel:  Recent Labs Lab 05/18/14 1410 05/19/14 0144 05/20/14 0515 05/21/14 0528  NA 141 141 138 138  K 5.3 5.3 4.9 5.3  CL 108 107 103 104  CO2 23 23 22 25   GLUCOSE 60* 186* 128* 163*  BUN 38* 39* 43* 40*  CREATININE 2.06* 2.19* 2.17* 2.11*  CALCIUM 9.1 8.5 8.6 8.9  MG 2.7*  --   --   --   PHOS 3.3  --   --   --    Liver Function Tests:  Recent Labs Lab 05/18/14 1410  AST 14  ALT 9  ALKPHOS 71  BILITOT 0.5  PROT 7.6  ALBUMIN 3.0*   No results for input(s): LIPASE, AMYLASE in the last 168 hours. No results for input(s): AMMONIA in the last 168 hours. CBC:  Recent Labs Lab 05/18/14 1410 05/19/14 0144 05/20/14 0515 05/21/14 0528  WBC 9.3 7.1 7.5 6.6  HGB 9.0* 8.7* 8.6* 8.9*  HCT 28.5* 28.2* 27.8* 28.3*  MCV 91.1 91.3 91.7 91.3  PLT 225 229 218 226   Cardiac Enzymes:  Recent Labs Lab 05/18/14 1410 05/18/14 2000 05/19/14 0144  TROPONINI <0.30 <0.30 <0.30   BNP: BNP (last 3 results)  Recent Labs  05/18/14 1410  PROBNP 4183.0*   CBG:  Recent Labs Lab 05/20/14 0737 05/20/14 1148 05/20/14 1648 05/20/14 2337 05/21/14 0756  GLUCAP 111* 357* 86 169* 142*       Signed:  Rion Catala  [facility] 05/21/2014, 9:16 AM

## 2014-05-21 NOTE — Discharge Instructions (Signed)

## 2014-05-21 NOTE — Progress Notes (Signed)
Pt was weaned off O2 with saturation of 90-91% on RA while lying in bed.

## 2014-05-21 NOTE — Plan of Care (Signed)
Problem: Phase III Progression Outcomes Goal: Pain controlled on oral analgesia Outcome: Progressing     

## 2014-06-02 ENCOUNTER — Ambulatory Visit: Payer: Medicare Other | Attending: Family Medicine | Admitting: Physical Therapy

## 2014-06-02 DIAGNOSIS — M6281 Muscle weakness (generalized): Secondary | ICD-10-CM | POA: Diagnosis present

## 2014-06-02 DIAGNOSIS — R262 Difficulty in walking, not elsewhere classified: Secondary | ICD-10-CM | POA: Diagnosis not present

## 2014-06-07 ENCOUNTER — Encounter: Payer: Medicare Other | Admitting: Physical Therapy

## 2014-06-09 ENCOUNTER — Encounter (HOSPITAL_COMMUNITY): Payer: Self-pay | Admitting: Cardiovascular Disease

## 2014-06-09 ENCOUNTER — Ambulatory Visit: Payer: Medicare Other | Admitting: Physical Therapy

## 2014-06-09 DIAGNOSIS — M6281 Muscle weakness (generalized): Secondary | ICD-10-CM | POA: Diagnosis not present

## 2014-06-14 ENCOUNTER — Ambulatory Visit: Payer: Medicare Other

## 2014-06-15 ENCOUNTER — Ambulatory Visit: Payer: Medicare Other | Admitting: Physical Therapy

## 2014-06-15 DIAGNOSIS — M6281 Muscle weakness (generalized): Secondary | ICD-10-CM | POA: Diagnosis not present

## 2014-06-20 ENCOUNTER — Emergency Department (HOSPITAL_COMMUNITY): Payer: Medicare Other

## 2014-06-20 ENCOUNTER — Encounter (HOSPITAL_COMMUNITY): Payer: Self-pay | Admitting: *Deleted

## 2014-06-20 ENCOUNTER — Inpatient Hospital Stay (HOSPITAL_COMMUNITY)
Admission: EM | Admit: 2014-06-20 | Discharge: 2014-06-30 | DRG: 291 | Disposition: A | Discharge status: Skilled Nursing Facility | Payer: Medicare Other | Attending: Internal Medicine | Admitting: Internal Medicine

## 2014-06-20 DIAGNOSIS — K5900 Constipation, unspecified: Secondary | ICD-10-CM | POA: Diagnosis present

## 2014-06-20 DIAGNOSIS — I719 Aortic aneurysm of unspecified site, without rupture: Secondary | ICD-10-CM | POA: Diagnosis present

## 2014-06-20 DIAGNOSIS — G5792 Unspecified mononeuropathy of left lower limb: Secondary | ICD-10-CM | POA: Diagnosis present

## 2014-06-20 DIAGNOSIS — E785 Hyperlipidemia, unspecified: Secondary | ICD-10-CM | POA: Diagnosis present

## 2014-06-20 DIAGNOSIS — Z8679 Personal history of other diseases of the circulatory system: Secondary | ICD-10-CM | POA: Insufficient documentation

## 2014-06-20 DIAGNOSIS — R911 Solitary pulmonary nodule: Secondary | ICD-10-CM | POA: Diagnosis present

## 2014-06-20 DIAGNOSIS — N390 Urinary tract infection, site not specified: Secondary | ICD-10-CM | POA: Insufficient documentation

## 2014-06-20 DIAGNOSIS — Z794 Long term (current) use of insulin: Secondary | ICD-10-CM

## 2014-06-20 DIAGNOSIS — I739 Peripheral vascular disease, unspecified: Secondary | ICD-10-CM | POA: Diagnosis present

## 2014-06-20 DIAGNOSIS — N400 Enlarged prostate without lower urinary tract symptoms: Secondary | ICD-10-CM | POA: Diagnosis present

## 2014-06-20 DIAGNOSIS — Z993 Dependence on wheelchair: Secondary | ICD-10-CM

## 2014-06-20 DIAGNOSIS — J8410 Pulmonary fibrosis, unspecified: Secondary | ICD-10-CM

## 2014-06-20 DIAGNOSIS — J441 Chronic obstructive pulmonary disease with (acute) exacerbation: Secondary | ICD-10-CM | POA: Diagnosis present

## 2014-06-20 DIAGNOSIS — I252 Old myocardial infarction: Secondary | ICD-10-CM | POA: Diagnosis not present

## 2014-06-20 DIAGNOSIS — Z87891 Personal history of nicotine dependence: Secondary | ICD-10-CM

## 2014-06-20 DIAGNOSIS — I5033 Acute on chronic diastolic (congestive) heart failure: Secondary | ICD-10-CM | POA: Diagnosis present

## 2014-06-20 DIAGNOSIS — E119 Type 2 diabetes mellitus without complications: Secondary | ICD-10-CM | POA: Diagnosis present

## 2014-06-20 DIAGNOSIS — N319 Neuromuscular dysfunction of bladder, unspecified: Secondary | ICD-10-CM | POA: Diagnosis present

## 2014-06-20 DIAGNOSIS — R9389 Abnormal findings on diagnostic imaging of other specified body structures: Secondary | ICD-10-CM | POA: Insufficient documentation

## 2014-06-20 DIAGNOSIS — M4802 Spinal stenosis, cervical region: Secondary | ICD-10-CM | POA: Diagnosis present

## 2014-06-20 DIAGNOSIS — K4020 Bilateral inguinal hernia, without obstruction or gangrene, not specified as recurrent: Secondary | ICD-10-CM | POA: Diagnosis present

## 2014-06-20 DIAGNOSIS — T502X5A Adverse effect of carbonic-anhydrase inhibitors, benzothiadiazides and other diuretics, initial encounter: Secondary | ICD-10-CM | POA: Diagnosis present

## 2014-06-20 DIAGNOSIS — I129 Hypertensive chronic kidney disease with stage 1 through stage 4 chronic kidney disease, or unspecified chronic kidney disease: Secondary | ICD-10-CM | POA: Diagnosis present

## 2014-06-20 DIAGNOSIS — R0602 Shortness of breath: Secondary | ICD-10-CM | POA: Diagnosis present

## 2014-06-20 DIAGNOSIS — I480 Paroxysmal atrial fibrillation: Secondary | ICD-10-CM | POA: Diagnosis present

## 2014-06-20 DIAGNOSIS — Z951 Presence of aortocoronary bypass graft: Secondary | ICD-10-CM | POA: Diagnosis not present

## 2014-06-20 DIAGNOSIS — N179 Acute kidney failure, unspecified: Secondary | ICD-10-CM | POA: Diagnosis present

## 2014-06-20 DIAGNOSIS — J189 Pneumonia, unspecified organism: Secondary | ICD-10-CM | POA: Diagnosis present

## 2014-06-20 DIAGNOSIS — R0902 Hypoxemia: Secondary | ICD-10-CM | POA: Insufficient documentation

## 2014-06-20 DIAGNOSIS — Z9181 History of falling: Secondary | ICD-10-CM

## 2014-06-20 DIAGNOSIS — N183 Chronic kidney disease, stage 3 unspecified: Secondary | ICD-10-CM

## 2014-06-20 DIAGNOSIS — I4819 Other persistent atrial fibrillation: Secondary | ICD-10-CM | POA: Insufficient documentation

## 2014-06-20 DIAGNOSIS — W19XXXA Unspecified fall, initial encounter: Secondary | ICD-10-CM

## 2014-06-20 DIAGNOSIS — I2510 Atherosclerotic heart disease of native coronary artery without angina pectoris: Secondary | ICD-10-CM | POA: Diagnosis present

## 2014-06-20 DIAGNOSIS — J432 Centrilobular emphysema: Secondary | ICD-10-CM

## 2014-06-20 DIAGNOSIS — J9601 Acute respiratory failure with hypoxia: Secondary | ICD-10-CM | POA: Diagnosis present

## 2014-06-20 DIAGNOSIS — T380X5A Adverse effect of glucocorticoids and synthetic analogues, initial encounter: Secondary | ICD-10-CM | POA: Diagnosis not present

## 2014-06-20 DIAGNOSIS — Z7982 Long term (current) use of aspirin: Secondary | ICD-10-CM | POA: Diagnosis not present

## 2014-06-20 DIAGNOSIS — R339 Retention of urine, unspecified: Secondary | ICD-10-CM | POA: Diagnosis present

## 2014-06-20 DIAGNOSIS — J9600 Acute respiratory failure, unspecified whether with hypoxia or hypercapnia: Secondary | ICD-10-CM | POA: Diagnosis present

## 2014-06-20 DIAGNOSIS — I10 Essential (primary) hypertension: Secondary | ICD-10-CM | POA: Diagnosis present

## 2014-06-20 LAB — COMPREHENSIVE METABOLIC PANEL
ALT: 15 U/L (ref 0–53)
AST: 26 U/L (ref 0–37)
Albumin: 3.3 g/dL — ABNORMAL LOW (ref 3.5–5.2)
Alkaline Phosphatase: 85 U/L (ref 39–117)
Anion gap: 18 — ABNORMAL HIGH (ref 5–15)
BUN: 41 mg/dL — AB (ref 6–23)
CALCIUM: 9.4 mg/dL (ref 8.4–10.5)
CO2: 19 meq/L (ref 19–32)
Chloride: 108 mEq/L (ref 96–112)
Creatinine, Ser: 2.09 mg/dL — ABNORMAL HIGH (ref 0.50–1.35)
GFR, EST AFRICAN AMERICAN: 32 mL/min — AB (ref >90)
GFR, EST NON AFRICAN AMERICAN: 28 mL/min — AB (ref >90)
GLUCOSE: 208 mg/dL — AB (ref 70–99)
Potassium: 4.5 mEq/L (ref 3.7–5.3)
SODIUM: 145 meq/L (ref 137–147)
Total Bilirubin: 1 mg/dL (ref 0.3–1.2)
Total Protein: 8.2 g/dL (ref 6.0–8.3)

## 2014-06-20 LAB — TROPONIN I: Troponin I: <0.3 ng/mL (ref <0.30)

## 2014-06-20 LAB — URINE MICROSCOPIC-ADD ON

## 2014-06-20 LAB — URINALYSIS, ROUTINE W REFLEX MICROSCOPIC
Bilirubin Urine: NEGATIVE
Glucose, UA: NEGATIVE mg/dL
Ketones, ur: NEGATIVE mg/dL
Nitrite: POSITIVE — AB
Specific Gravity, Urine: 1.016 (ref 1.005–1.030)
UROBILINOGEN UA: 0.2 mg/dL (ref 0.0–1.0)
pH: 6 (ref 5.0–8.0)

## 2014-06-20 LAB — PRO B NATRIURETIC PEPTIDE: Pro B Natriuretic peptide (BNP): 14250 pg/mL — ABNORMAL HIGH (ref 0–450)

## 2014-06-20 LAB — GLUCOSE, CAPILLARY
GLUCOSE-CAPILLARY: 209 mg/dL — AB (ref 70–99)
Glucose-Capillary: 140 mg/dL — ABNORMAL HIGH (ref 70–99)

## 2014-06-20 LAB — CBC WITH DIFFERENTIAL/PLATELET
Basophils Absolute: 0 10*3/uL (ref 0.0–0.1)
Basophils Relative: 0 % (ref 0–1)
Eosinophils Absolute: 0 10*3/uL (ref 0.0–0.7)
Eosinophils Relative: 0 % (ref 0–5)
HEMATOCRIT: 32.7 % — AB (ref 39.0–52.0)
HEMOGLOBIN: 10 g/dL — AB (ref 13.0–17.0)
LYMPHS ABS: 0.8 10*3/uL (ref 0.7–4.0)
LYMPHS PCT: 7 % — AB (ref 12–46)
MCH: 28.8 pg (ref 26.0–34.0)
MCHC: 30.6 g/dL (ref 30.0–36.0)
MCV: 94.2 fL (ref 78.0–100.0)
MONOS PCT: 9 % (ref 3–12)
Monocytes Absolute: 1.1 10*3/uL — ABNORMAL HIGH (ref 0.1–1.0)
Neutro Abs: 9.9 10*3/uL — ABNORMAL HIGH (ref 1.7–7.7)
Neutrophils Relative %: 84 % — ABNORMAL HIGH (ref 43–77)
Platelets: 211 10*3/uL (ref 150–400)
RBC: 3.47 MIL/uL — ABNORMAL LOW (ref 4.22–5.81)
RDW: 18.2 % — ABNORMAL HIGH (ref 11.5–15.5)
WBC: 11.8 10*3/uL — AB (ref 4.0–10.5)

## 2014-06-20 LAB — BLOOD GAS, ARTERIAL
Acid-base deficit: 2.5 mmol/L — ABNORMAL HIGH (ref 0.0–2.0)
BICARBONATE: 21.1 meq/L (ref 20.0–24.0)
Drawn by: 307971
FIO2: 1 %
O2 Saturation: 94.2 %
PCO2 ART: 34 mmHg — AB (ref 35.0–45.0)
Patient temperature: 98.6
TCO2: 19.5 mmol/L (ref 0–100)
pH, Arterial: 7.409 (ref 7.350–7.450)
pO2, Arterial: 84.6 mmHg (ref 80.0–100.0)

## 2014-06-20 LAB — I-STAT CG4 LACTIC ACID, ED: Lactic Acid, Venous: 2.26 mmol/L — ABNORMAL HIGH (ref 0.5–2.2)

## 2014-06-20 LAB — PROTIME-INR
INR: 1.41 (ref 0.00–1.49)
PROTHROMBIN TIME: 17.4 s — AB (ref 11.6–15.2)

## 2014-06-20 LAB — PROCALCITONIN: PROCALCITONIN: 0.67 ng/mL

## 2014-06-20 MED ORDER — INSULIN ASPART 100 UNIT/ML ~~LOC~~ SOLN
0.0000 [IU] | Freq: Three times a day (TID) | SUBCUTANEOUS | Status: DC
  Administered 2014-06-20 – 2014-06-21 (×2): 1 [IU] via SUBCUTANEOUS
  Administered 2014-06-21: 5 [IU] via SUBCUTANEOUS
  Administered 2014-06-22: 2 [IU] via SUBCUTANEOUS
  Administered 2014-06-22: 4 [IU] via SUBCUTANEOUS
  Administered 2014-06-22: 2 [IU] via SUBCUTANEOUS
  Administered 2014-06-23: 1 [IU] via SUBCUTANEOUS
  Administered 2014-06-23 (×2): 9 [IU] via SUBCUTANEOUS

## 2014-06-20 MED ORDER — ACETAMINOPHEN 325 MG PO TABS: 650.0000 mg | ORAL_TABLET | ORAL | Status: DC | PRN

## 2014-06-20 MED ORDER — VANCOMYCIN HCL IN DEXTROSE 1-5 GM/200ML-% IV SOLN
1000.0000 mg | Freq: Every day | INTRAVENOUS | Status: DC
  Administered 2014-06-20: 1000 mg via INTRAVENOUS
  Filled 2014-06-20: qty 200

## 2014-06-20 MED ORDER — IPRATROPIUM-ALBUTEROL 0.5-2.5 (3) MG/3ML IN SOLN
3.0000 mL | Freq: Four times a day (QID) | RESPIRATORY_TRACT | Status: DC
  Administered 2014-06-20 – 2014-06-22 (×8): 3 mL via RESPIRATORY_TRACT
  Filled 2014-06-20 (×8): qty 3

## 2014-06-20 MED ORDER — SODIUM CHLORIDE 0.9 % IJ SOLN
3.0000 mL | Freq: Two times a day (BID) | INTRAMUSCULAR | Status: DC
  Administered 2014-06-20 – 2014-06-30 (×11): 3 mL via INTRAVENOUS

## 2014-06-20 MED ORDER — GABAPENTIN 300 MG PO CAPS
300.0000 mg | ORAL_CAPSULE | ORAL | Status: DC
  Administered 2014-06-22 – 2014-06-29 (×8): 300 mg via ORAL
  Filled 2014-06-20 (×10): qty 1

## 2014-06-20 MED ORDER — SODIUM CHLORIDE 0.9 % IJ SOLN: 3.0000 mL | INTRAMUSCULAR | Status: DC | PRN

## 2014-06-20 MED ORDER — PIPERACILLIN-TAZOBACTAM 3.375 G IVPB
3.3750 g | Freq: Once | INTRAVENOUS | Status: AC
  Administered 2014-06-20: 3.375 g via INTRAVENOUS
  Filled 2014-06-20: qty 50

## 2014-06-20 MED ORDER — PIPERACILLIN-TAZOBACTAM 3.375 G IVPB
3.3750 g | Freq: Three times a day (TID) | INTRAVENOUS | Status: DC
  Administered 2014-06-21 – 2014-06-22 (×5): 3.375 g via INTRAVENOUS
  Filled 2014-06-20 (×5): qty 50

## 2014-06-20 MED ORDER — SODIUM CHLORIDE 0.9 % IV SOLN
250.0000 mL | INTRAVENOUS | Status: DC | PRN
  Administered 2014-06-27: 250 mL via INTRAVENOUS

## 2014-06-20 MED ORDER — OXYCODONE-ACETAMINOPHEN 5-325 MG PO TABS
1.0000 | ORAL_TABLET | ORAL | Status: DC
  Administered 2014-06-22 – 2014-06-29 (×8): 1 via ORAL
  Filled 2014-06-20 (×8): qty 1

## 2014-06-20 MED ORDER — OXYCODONE-ACETAMINOPHEN 5-325 MG PO TABS
2.0000 | ORAL_TABLET | Freq: Two times a day (BID) | ORAL | Status: DC
  Administered 2014-06-20 – 2014-06-30 (×20): 2 via ORAL
  Filled 2014-06-20 (×20): qty 2

## 2014-06-20 MED ORDER — OMEGA-3-ACID ETHYL ESTERS 1 G PO CAPS
1.0000 g | ORAL_CAPSULE | Freq: Two times a day (BID) | ORAL | Status: DC
  Administered 2014-06-20 – 2014-06-25 (×10): 1 g via ORAL
  Filled 2014-06-20 (×10): qty 1

## 2014-06-20 MED ORDER — INSULIN GLARGINE 100 UNIT/ML ~~LOC~~ SOLN
10.0000 [IU] | Freq: Every day | SUBCUTANEOUS | Status: DC
  Administered 2014-06-20 – 2014-06-22 (×3): 10 [IU] via SUBCUTANEOUS
  Filled 2014-06-20 (×4): qty 0.1

## 2014-06-20 MED ORDER — HEPARIN SODIUM (PORCINE) 5000 UNIT/ML IJ SOLN
5000.0000 [IU] | Freq: Three times a day (TID) | INTRAMUSCULAR | Status: DC
  Administered 2014-06-20 – 2014-06-30 (×29): 5000 [IU] via SUBCUTANEOUS
  Filled 2014-06-20 (×29): qty 1

## 2014-06-20 MED ORDER — FUROSEMIDE 10 MG/ML IJ SOLN
20.0000 mg | Freq: Once | INTRAMUSCULAR | Status: AC
  Administered 2014-06-20: 20 mg via INTRAVENOUS
  Filled 2014-06-20: qty 2

## 2014-06-20 MED ORDER — GABAPENTIN 300 MG PO CAPS
300.0000 mg | ORAL_CAPSULE | ORAL | Status: DC
Start: 2014-06-20 — End: 2014-06-20

## 2014-06-20 MED ORDER — FERROUS SULFATE 325 (65 FE) MG PO TABS
325.0000 mg | ORAL_TABLET | Freq: Every day | ORAL | Status: DC
  Administered 2014-06-21 – 2014-06-30 (×10): 325 mg via ORAL
  Filled 2014-06-20 (×10): qty 1

## 2014-06-20 MED ORDER — METOPROLOL TARTRATE 50 MG PO TABS
50.0000 mg | ORAL_TABLET | Freq: Two times a day (BID) | ORAL | Status: DC
  Administered 2014-06-20: 50 mg via ORAL
  Filled 2014-06-20: qty 1

## 2014-06-20 MED ORDER — GABAPENTIN 300 MG PO CAPS
600.0000 mg | ORAL_CAPSULE | Freq: Two times a day (BID) | ORAL | Status: DC
  Administered 2014-06-20 – 2014-06-24 (×9): 600 mg via ORAL
  Administered 2014-06-25: 300 mg via ORAL
  Administered 2014-06-25 – 2014-06-28 (×6): 600 mg via ORAL
  Filled 2014-06-20 (×16): qty 2

## 2014-06-20 MED ORDER — EZETIMIBE 10 MG PO TABS
10.0000 mg | ORAL_TABLET | Freq: Every day | ORAL | Status: DC
  Administered 2014-06-21 – 2014-06-30 (×10): 10 mg via ORAL
  Filled 2014-06-20 (×10): qty 1

## 2014-06-20 MED ORDER — ONDANSETRON HCL 4 MG/2ML IJ SOLN: 4.0000 mg | Freq: Four times a day (QID) | INTRAMUSCULAR | Status: DC | PRN

## 2014-06-20 MED ORDER — ASPIRIN EC 81 MG PO TBEC
81.0000 mg | DELAYED_RELEASE_TABLET | Freq: Every day | ORAL | Status: DC
  Administered 2014-06-21 – 2014-06-30 (×10): 81 mg via ORAL
  Filled 2014-06-20 (×10): qty 1

## 2014-06-20 MED ORDER — FUROSEMIDE 10 MG/ML IJ SOLN
20.0000 mg | Freq: Once | INTRAMUSCULAR | Status: AC
  Administered 2014-06-20: 20 mg via INTRAVENOUS
  Filled 2014-06-20: qty 4

## 2014-06-20 MED ORDER — OXYCODONE-ACETAMINOPHEN 5-325 MG PO TABS
1.0000 | ORAL_TABLET | Freq: Once | ORAL | Status: AC
  Administered 2014-06-20: 1 via ORAL
  Filled 2014-06-20: qty 1

## 2014-06-20 MED ORDER — AMIODARONE HCL 200 MG PO TABS
200.0000 mg | ORAL_TABLET | Freq: Every evening | ORAL | Status: DC
  Administered 2014-06-20: 200 mg via ORAL
  Filled 2014-06-20 (×2): qty 1

## 2014-06-20 MED ORDER — VANCOMYCIN HCL IN DEXTROSE 1-5 GM/200ML-% IV SOLN
1000.0000 mg | Freq: Once | INTRAVENOUS | Status: AC
  Administered 2014-06-20: 1000 mg via INTRAVENOUS
  Filled 2014-06-20: qty 200

## 2014-06-20 NOTE — ED Notes (Signed)
Bed: WA08 Expected date:  Expected time:  Means of arrival:  Comments: 

## 2014-06-20 NOTE — Progress Notes (Signed)
ANTIBIOTIC CONSULT NOTE - INITIAL  Pharmacy Consult for vancomycin Indication: pneumonia  Allergies  Allergen Reactions  . Cymbalta [Duloxetine Hcl] Other (See Comments)    Confused, paralyzed from waist up   . Statins Other (See Comments)    Leg pains    Patient Measurements:   Adjusted Body Weight:   Vital Signs: BP: 119/59 mmHg (12/21 1433) Pulse Rate: 61 (12/21 1433) Intake/Output from previous day:   Intake/Output from this shift:    Labs:  Recent Labs  06/20/14 1201  WBC 11.8*  HGB 10.0*  PLT 211  CREATININE 2.09*   CrCl cannot be calculated (Unknown ideal weight.). No results for input(s): VANCOTROUGH, VANCOPEAK, VANCORANDOM, GENTTROUGH, GENTPEAK, GENTRANDOM, TOBRATROUGH, TOBRAPEAK, TOBRARND, AMIKACINPEAK, AMIKACINTROU, AMIKACIN in the last 72 hours.   Microbiology: No results found for this or any previous visit (from the past 720 hour(s)).  Medical History: Past Medical History  Diagnosis Date  . Peripheral vascular disease     critical limb ischemia  . Myocardial infarction   . Diabetes mellitus without complication   . Hypertension   . Coronary artery disease   . Atrial fibrillation   . Aortic dissection     w/ Cardiac tamponade  . AVM (arteriovenous malformation)   . Murmur   . Hyperlipidemia   . Neuropathy of left lower extremity   . S/P CABG (coronary artery bypass graft)     Assessment: [age] YOM presents to ED with shortness of breath, found to be hypoxic.  He was admitted in November for pneumoniae and discharged on levofloxacin.  CXR reveals atypical pneumonia vs edema.  CT reveals right upper lobe pulmonary nodule with right paratracheal adenopathy, concerning for malignancy./  UA with significant pyuria. Vancomycin per pharmacy and zosyn ordered per MD  12/21 >>vancomycin  >> 12/21 >>zosyn (per MD)  >>    Tmax: WBCs: slightly elevated Renal: SCr = 2.09 with h/o CKD, normalized CrCL = 2927ml/min  / blood: 11/18 urine:  E. Aerogenes  (R to cefazolin, I NTF) / sputum:   Goal of Therapy:  Vancomycin trough level 15-20 mcg/ml  Plan:   Vancomycin 1gm x 1 given in ED, give 1gm IV q24h - start at 22:00 tonight as staggered LD  Check vancomycin trough as steady state or as indicated per renal fx  Monitor renal function  Correct zosyn to 3.375gm IV q8h (over 4h infusion, entered q8h over 30min)  CrCl is > 3520ml/min  Follow cultures and suggest adjust antibiotics accordingly  Juliette Alcideustin Zeigler, PharmD, BCPS.   Pager: [phone]  06/20/2014,4:39 PM

## 2014-06-20 NOTE — Progress Notes (Signed)
UR completed 

## 2014-06-20 NOTE — ED Provider Notes (Signed)
CSN: [id number]     Arrival date & time 06/20/14  1126 History   First MD Initiated Contact with Patient 06/20/14 1142     Chief Complaint  Patient presents with  . hypoxia    . Shortness of Breath  . Altered Mental Status     (Consider location/radiation/quality/duration/timing/severity/associated sxs/prior Treatment) HPI Comments: Patient from PCPs office with shortness of breath and confusion after fall 3 days ago. Wife states he fell while transferring from wheelchair to bed 3 days ago bruising left abdomen. Did not hit head or lose consciousness. Has had progressively worsening shortness of breath since then with productive cough. Hypoxic to 80% at PCPs office today. He denies chest pain. He denies abdominal pain, headache, nausea or vomiting. Denies any neck or back pain. Denies any fevers or chills. Recent hospitalization last month for pneumonia. Patient now alert and oriented 3. His wife states he's had intermittent confusion since the fall 3 days ago but seems better today.  Patient is a 80 y.o. male presenting with altered mental status. The history is provided by the patient and a relative. The history is limited by the absence of a caregiver and the condition of the patient.  Altered Mental Status Associated symptoms: no abdominal pain, no headaches, no light-headedness, no nausea and no vomiting     Past Medical History  Diagnosis Date  . Peripheral vascular disease     critical limb ischemia  . Myocardial infarction   . Diabetes mellitus without complication   . Hypertension   . Coronary artery disease   . Atrial fibrillation   . Aortic dissection     w/ Cardiac tamponade  . AVM (arteriovenous malformation)   . Murmur   . Hyperlipidemia   . Neuropathy of left lower extremity   . S/P CABG (coronary artery bypass graft)    Past Surgical History  Procedure Laterality Date  . Coronary artery bypass graft  02/11/2005    LIMA to LAD, SVG to 1st diag, SVG to OM1, and  SVG to PD  . Back surgery    . Atherectomy  09/15/2012    Diamondback orbital rotation atherectomy was performed using a 2 mm solid crown up to a max of 120,000 RPM. Angioplasty was performed using a 5x112mm long chocolate balloon for 2 minute inflations. Resulting in reduction of an 80% calcified distal R SFA stenosis to less than 20%  . Lea doppler  10/01/2012    R SFA demonstrated a mild amount of residual plaque suggesting less then 50% diametere reduction. R Calf runoff-one vessel runoff via peroneal artery. The posterior and anterior tibial arteries appeared occluded.  . Cardiac catheterization  02/04/2005    Severe 3-vessel disease affecting LAD and RCA. Consider CABG  . Cardiovascular stress test  12/16/2006    EKG negative for ischemia. No ECG changes. No significant ischemia demonstrated  . Transthoracic echocardiogram  12/04/2011    EF 50-55%, moderate aortic root dilation  . Atherectomy N/A 09/15/2012    Procedure: ATHERECTOMY;  Surgeon: Runell Gess, MD;  Location: [facility] CATH LAB;  Service: Cardiovascular;  Laterality: N/A;   History reviewed. No pertinent family history. History  Substance Use Topics  . Smoking status: Former Smoker    Quit date: 06/20/1992  . Smokeless tobacco: Never Used  . Alcohol Use: Yes     Comment: 1 glass red wine each night    Review of Systems  Constitutional: Positive for activity change, appetite change and fatigue.  HENT: Negative for congestion  and rhinorrhea.   Eyes: Negative for visual disturbance.  Respiratory: Positive for cough and shortness of breath.   Cardiovascular: Negative for chest pain.  Gastrointestinal: Negative for nausea, vomiting and abdominal pain.  Genitourinary: Negative for dysuria and hematuria.  Musculoskeletal: Negative for myalgias and arthralgias.  Skin: Negative for wound.  Neurological: Negative for dizziness, light-headedness and headaches.  A complete 10 system review of systems was obtained and all systems are  negative except as noted in the HPI and PMH.      Allergies  Cymbalta and Statins  Home Medications   Prior to Admission medications   Medication Sig Start Date End Date Taking? Authorizing Provider  amiodarone (PACERONE) 200 MG tablet Take 1 tablet (200 mg total) by mouth daily. Patient taking differently: Take 200 mg by mouth every evening.  12/16/13  Yes Lennette Biharihomas A Kelly, MD  amLODipine (NORVASC) 5 MG tablet Take 1 tablet (5 mg total) by mouth daily. Takes 1/2 tablet daily Patient taking differently: Take 5 mg by mouth daily.  03/31/14  Yes Lennette Biharihomas A Kelly, MD  apixaban (ELIQUIS) 2.5 MG TABS tablet Take 1 tablet (2.5 mg total) by mouth 2 (two) times daily. 10/28/13  Yes Lennette Biharihomas A Kelly, MD  aspirin 81 MG tablet Take 81 mg by mouth daily with breakfast.    Yes Historical Provider, MD  calcium carbonate (OS-CAL - DOSED IN MG OF ELEMENTAL CALCIUM) 1250 MG tablet Take 1 tablet by mouth 2 (two) times daily with a meal.    Yes Historical Provider, MD  doxazosin (CARDURA) 2 MG tablet Take 1.5 tablets (3 mg total) by mouth at bedtime. Patient taking differently: Take 2 mg by mouth at bedtime.  11/30/13  Yes Lennette Biharihomas A Kelly, MD  ezetimibe (ZETIA) 10 MG tablet Take 1 tablet (10 mg total) by mouth daily. Patient taking differently: Take 10 mg by mouth daily with breakfast.  12/29/12  Yes Runell GessJonathan J Berry, MD  ferrous sulfate 325 (65 FE) MG tablet Take 1 tablet (325 mg total) by mouth daily with breakfast. 05/21/14  Yes Maryann Mikhail, DO  gabapentin (NEURONTIN) 300 MG capsule Take 300-600 mg by mouth See admin instructions. 2 tabs every morning, 1 tab in the afternoon and 2 tabs at bedtime   Yes Historical Provider, MD  insulin glargine (LANTUS) 100 UNIT/ML injection Inject 20 Units into the skin daily with breakfast.    Yes Historical Provider, MD  metoprolol (LOPRESSOR) 50 MG tablet Take 50 mg by mouth 2 (two) times daily. 10/28/13  Yes Lennette Biharihomas A Kelly, MD  MILK THISTLE PO Take 1 tablet by mouth daily with  breakfast.   Yes Historical Provider, MD  Multiple Vitamin (MULTIVITAMIN WITH MINERALS) TABS tablet Take 1 tablet by mouth daily.   Yes Historical Provider, MD  Omega-3 Fatty Acids (FISH OIL PO) Take 1 capsule by mouth 2 (two) times daily.   Yes Historical Provider, MD  oxyCODONE-acetaminophen (PERCOCET/ROXICET) 5-325 MG per tablet Take 1-2 tablets by mouth See admin instructions. 2 tabs every morning, 1 tab in the afternoon and 2 tabs at bedtime   Yes Historical Provider, MD  Probiotic Product (PROBIOTIC DAILY) CAPS Take 1 capsule by mouth every evening.   Yes Historical Provider, MD  levofloxacin (LEVAQUIN) 500 MG tablet Take 1 tablet (500 mg total) by mouth every other day. Patient not taking: Reported on 06/20/2014 05/21/14   Maryann Mikhail, DO   BP 145/46 mmHg  Pulse 59  Temp(Src) 97.5 F (36.4 C) (Axillary)  Resp 18  Ht 6'  0.05" (1.83 m)  Wt 159 lb 13.3 oz (72.5 kg)  BMI 21.65 kg/m2  SpO2 97% Physical Exam  Constitutional: He is oriented to person, place, and time. He appears well-developed and well-nourished. No distress.  HENT:  Head: Normocephalic and atraumatic.  Mouth/Throat: Oropharynx is clear and moist. No oropharyngeal exudate.  Eyes: Conjunctivae and EOM are normal. Pupils are equal, round, and reactive to light.  Neck: Normal range of motion. Neck supple.  No C-spine tenderness  Cardiovascular: Normal rate, regular rhythm, normal heart sounds and intact distal pulses.   No murmur heard. Pulmonary/Chest: Effort normal. No respiratory distress. He has rales.  Crackles bilaterally  Abdominal: Soft. There is no tenderness. There is no rebound and no guarding.  Ecchymosis to L abdomen  Musculoskeletal: Normal range of motion. He exhibits no edema or tenderness.  No T or L spine tnederness  Neurological: He is alert and oriented to person, place, and time. No cranial nerve deficit. He exhibits normal muscle tone. Coordination normal.  CN 2-12 intact. Equal grip strength.  Equal weakness in lower extremities at baseline.  Skin: Skin is warm.  Psychiatric: He has a normal mood and affect. His behavior is normal.  Nursing note and vitals reviewed.   ED Course  Procedures (including critical care time) Labs Review Labs Reviewed  CBC WITH DIFFERENTIAL - Abnormal; Notable for the following:    WBC 11.8 (*)    RBC 3.47 (*)    Hemoglobin 10.0 (*)    HCT 32.7 (*)    RDW 18.2 (*)    Neutrophils Relative % 84 (*)    Neutro Abs 9.9 (*)    Lymphocytes Relative 7 (*)    Monocytes Absolute 1.1 (*)    All other components within normal limits  COMPREHENSIVE METABOLIC PANEL - Abnormal; Notable for the following:    Glucose, Bld 208 (*)    BUN 41 (*)    Creatinine, Ser 2.09 (*)    Albumin 3.3 (*)    GFR calc non Af Amer 28 (*)    GFR calc Af Amer 32 (*)    Anion gap 18 (*)    All other components within normal limits  PROTIME-INR - Abnormal; Notable for the following:    Prothrombin Time 17.4 (*)    All other components within normal limits  URINALYSIS, ROUTINE W REFLEX MICROSCOPIC - Abnormal; Notable for the following:    APPearance TURBID (*)    Hgb urine dipstick MODERATE (*)    Protein, ur >300 (*)    Nitrite POSITIVE (*)    Leukocytes, UA LARGE (*)    All other components within normal limits  PRO B NATRIURETIC PEPTIDE - Abnormal; Notable for the following:    Pro B Natriuretic peptide (BNP) 14250.0 (*)    All other components within normal limits  BLOOD GAS, ARTERIAL - Abnormal; Notable for the following:    pCO2 arterial 34.0 (*)    Acid-base deficit 2.5 (*)    All other components within normal limits  GLUCOSE, CAPILLARY - Abnormal; Notable for the following:    Glucose-Capillary 140 (*)    All other components within normal limits  I-STAT CG4 LACTIC ACID, ED - Abnormal; Notable for the following:    Lactic Acid, Venous 2.26 (*)    All other components within normal limits  CULTURE, BLOOD (ROUTINE X 2)  CULTURE, BLOOD (ROUTINE X 2)   TROPONIN I  URINE MICROSCOPIC-ADD ON  PROCALCITONIN  BASIC METABOLIC PANEL    Imaging Review  Ct Abdomen Pelvis Wo Contrast  06/20/2014   CLINICAL DATA:  Fall in bathroom. Anti coagulation. Increased confusion.  EXAM: CT CHEST, ABDOMEN AND PELVIS WITHOUT CONTRAST  TECHNIQUE: Multidetector CT imaging of the chest, abdomen and pelvis was performed following the standard protocol without IV contrast.  COMPARISON:  Multiple exams, including 05/19/2014 and 03/26/2005  FINDINGS: CT CHEST FINDINGS  Right upper paratracheal node 1.2 cm in short axis, image 12 series 2. Subcarinal node 1.4 cm in short axis, image 34 series 2. Additional paratracheal nodes are present. If hilar nodes are enlarged there difficult to measure against the background vasculature.  Aortic and branch vessel atherosclerosis. Prior CABG. Ascending thoracic aorta 4.6 cm transverse ; descending thoracic aorta 4.6 cm transverse. Lower descending thoracic aorta 5.0 cm transverse. There is mixed density surrounding tubular calcification in the ascending thoracic aorta potentially from chronic dissection, chronic mural thrombus, or even intramural hematoma. Some of this appearance could be from an ascending aortic graft-correlate with patient history. Without IV contrast did is difficult to characterize this further.  Trace left pleural effusion. Mild enlargement of the cardiopericardial silhouette  Emphysema is present with scattered scarring and peripheral fibrosis potentially with some early honeycombing at the lung bases. In addition, there is a 2.4 by 1.4 cm nodule anteriorly in the right upper lobe, image 25 of series 5 interstitial accentuation is present bilaterally.  CT ABDOMEN AND PELVIS FINDINGS  Hepatobiliary: Unremarkable  Pancreas: Speckled calcifications throughout the pancreatic parenchyma compatible with chronic calcific pancreatitis.  Spleen: Unremarkable  Adrenals/Urinary Tract: 8 mm left kidney lower pole nonobstructive  calculus. Bilateral fluid density renal lesions favoring cysts. No hydronephrosis or hydroureter. No ureteral calculi.  Stomach/Bowel: Prominent stool throughout the colon favors constipation.  Vascular/Lymphatic: Dense aortoiliac atherosclerosis.  Reproductive: Enlarged prostate gland with scattered calcifications, measuring 5.8 by 4.6 cm.  Other: No supplemental non-categorized findings.  Musculoskeletal: Bridging fusion of the sacroiliac joints. posterolateral rod and pedicle screw fixation at L4-5 with interbody and anterior facet fusion. Intervertebral and facet spurring at L3-4 and L5-S1. Small bilateral inguinal hernias contain adipose tissue.  IMPRESSION: 1. 2.4 by 1.4 cm right upper lobe pulmonary nodule with right paratracheal adenopathy, concerning for malignancy. An atypical infectious process could potentially give this appearance. Consider nuclear medicine PET-CT. 2. Severe emphysema with peripheral fibrosis, interstitial accentuation, and some basilar honeycombing. 3. Ascending and descending thoracic aortic aneurysms measuring up to 5 cm transversely. There is a mixed density in the ascending aortic aneurysm with a complex appearance, possibly from graft material and excluded thrombus, chronic mural thrombus, chronic dissection, or intramural hematoma. Correlate with operative history. If IV contrast could not be administered, MRI might help further assess the thoracic aorta. Otherwise I recommend semi-annual imaging followup by CTA or MRA and referral to cardiothoracic surgery if not already obtained. This recommendation follows 2010 ACCF/AHA/AATS/ACR/ASA/SCA/SCAI/SIR/STS/SVM Guidelines for the Diagnosis and Management of Patients With Thoracic Aortic Disease. Circulation. 2010; 121: Z610-R604. 4. Trace left pleural effusion. 5. Chronic calcific pancreatitis. 6.  Prominent stool throughout the colon favors constipation. 7. Enlarged prostate gland. 8. Lumbar spondylosis particularly at L3-4 and  L5-S1.   Electronically Signed   By: Herbie Baltimore M.D.   On: 06/20/2014 13:58   Ct Head Wo Contrast  06/20/2014   CLINICAL DATA:  Fall.  Hypoxia.  EXAM: CT HEAD WITHOUT CONTRAST  CT CERVICAL SPINE WITHOUT CONTRAST  TECHNIQUE: Multidetector CT imaging of the head and cervical spine was performed following the standard protocol without intravenous contrast. Multiplanar CT image  reconstructions of the cervical spine were also generated.  COMPARISON:  Report from 11/24/2000  FINDINGS: CT HEAD FINDINGS  Hypodense lesion in the right middle cranial fossa tracking up into the sylvian fissure, fluid density, probably an arachnoid cyst or epidermoid. Size 4.0 by 3.2 by approximately 6.0 cm.  Atherosclerosis noted. The brainstem, cerebellum, cerebral peduncle sits, thalami, and basal ganglia appear unremarkable. The ventricular system appears normal and aside from the right middle cranial fossa lesion the basilar cisterns appear normal.  Periventricular white matter and corona radiata hypodensities favor chronic ischemic microvascular white matter disease. No intracranial hemorrhage, mass lesion, or acute CVA. There is a subcutaneous lesion along the occiput/upper neck in the midline on image 1 of series 3 which could represent subcutaneous bruising. Chronic left frontal sinusitis.  CT CERVICAL SPINE FINDINGS  Considerable degenerative findings at the craniocervical junction and anterior arch of C1-2 with extensive spurring of the basion and pannus formation posterior to the odontoid. Prominent loss of articular space at the anterior C1-2 articulation.  Uncinate and facet spurring cause osseous foraminal stenosis on the right at C2-3 and on the left at C3-4, C4-5, and C6-7. There is interbody fusion at the C5-6 low-level and facet fusion on the left at C4-5 and C7-T1.  There is 2 mm degenerative anterolisthesis at C4-5 and at C7-T1. Degenerative endplate sclerosis noted with posterior osseous ridging at C6-7, C7-T1,  and T1-2.  No prevertebral soft tissue swelling. No acute fracture is identified. There is dextroconvex cervical scoliosis.  Prominent emphysema and scarring noted at the lung apices.  IMPRESSION: 1. Right middle cranial fossa arachnoid cyst or epidermoid tracking into the sylvian fissure. MRI can typically differentiate between these 2 entities if clinically warranted. 2. Atherosclerosis. 3. Dense subcutaneous lesion along the occiput but and upper neck could represent some focal bruising. 4. Chronic left frontal sinusitis. 5. Cervical spondylosis causes multilevel osseous foraminal impingement. 6. Considerable spurring at the C1- 2 articulation and along the basion, with pannus formation posterior to the odontoid-rheumatoid arthropathy not excluded. 7. Emphysema.   Electronically Signed   By: Herbie BaltimoreWalt  Liebkemann M.D.   On: 06/20/2014 13:36   Ct Chest Wo Contrast  06/20/2014   CLINICAL DATA:  Fall in bathroom. Anti coagulation. Increased confusion.  EXAM: CT CHEST, ABDOMEN AND PELVIS WITHOUT CONTRAST  TECHNIQUE: Multidetector CT imaging of the chest, abdomen and pelvis was performed following the standard protocol without IV contrast.  COMPARISON:  Multiple exams, including 05/19/2014 and 03/26/2005  FINDINGS: CT CHEST FINDINGS  Right upper paratracheal node 1.2 cm in short axis, image 12 series 2. Subcarinal node 1.4 cm in short axis, image 34 series 2. Additional paratracheal nodes are present. If hilar nodes are enlarged there difficult to measure against the background vasculature.  Aortic and branch vessel atherosclerosis. Prior CABG. Ascending thoracic aorta 4.6 cm transverse ; descending thoracic aorta 4.6 cm transverse. Lower descending thoracic aorta 5.0 cm transverse. There is mixed density surrounding tubular calcification in the ascending thoracic aorta potentially from chronic dissection, chronic mural thrombus, or even intramural hematoma. Some of this appearance could be from an ascending aortic  graft-correlate with patient history. Without IV contrast did is difficult to characterize this further.  Trace left pleural effusion. Mild enlargement of the cardiopericardial silhouette  Emphysema is present with scattered scarring and peripheral fibrosis potentially with some early honeycombing at the lung bases. In addition, there is a 2.4 by 1.4 cm nodule anteriorly in the right upper lobe, image 25 of series 5 interstitial  accentuation is present bilaterally.  CT ABDOMEN AND PELVIS FINDINGS  Hepatobiliary: Unremarkable  Pancreas: Speckled calcifications throughout the pancreatic parenchyma compatible with chronic calcific pancreatitis.  Spleen: Unremarkable  Adrenals/Urinary Tract: 8 mm left kidney lower pole nonobstructive calculus. Bilateral fluid density renal lesions favoring cysts. No hydronephrosis or hydroureter. No ureteral calculi.  Stomach/Bowel: Prominent stool throughout the colon favors constipation.  Vascular/Lymphatic: Dense aortoiliac atherosclerosis.  Reproductive: Enlarged prostate gland with scattered calcifications, measuring 5.8 by 4.6 cm.  Other: No supplemental non-categorized findings.  Musculoskeletal: Bridging fusion of the sacroiliac joints. posterolateral rod and pedicle screw fixation at L4-5 with interbody and anterior facet fusion. Intervertebral and facet spurring at L3-4 and L5-S1. Small bilateral inguinal hernias contain adipose tissue.  IMPRESSION: 1. 2.4 by 1.4 cm right upper lobe pulmonary nodule with right paratracheal adenopathy, concerning for malignancy. An atypical infectious process could potentially give this appearance. Consider nuclear medicine PET-CT. 2. Severe emphysema with peripheral fibrosis, interstitial accentuation, and some basilar honeycombing. 3. Ascending and descending thoracic aortic aneurysms measuring up to 5 cm transversely. There is a mixed density in the ascending aortic aneurysm with a complex appearance, possibly from graft material and  excluded thrombus, chronic mural thrombus, chronic dissection, or intramural hematoma. Correlate with operative history. If IV contrast could not be administered, MRI might help further assess the thoracic aorta. Otherwise I recommend semi-annual imaging followup by CTA or MRA and referral to cardiothoracic surgery if not already obtained. This recommendation follows 2010 ACCF/AHA/AATS/ACR/ASA/SCA/SCAI/SIR/STS/SVM Guidelines for the Diagnosis and Management of Patients With Thoracic Aortic Disease. Circulation. 2010; 121: [id number]. 4. Trace left pleural effusion. 5. Chronic calcific pancreatitis. 6.  Prominent stool throughout the colon favors constipation. 7. Enlarged prostate gland. 8. Lumbar spondylosis particularly at L3-4 and L5-S1.   Electronically Signed   By: Herbie Baltimore M.D.   On: 06/20/2014 13:58   Ct Cervical Spine Wo Contrast  06/20/2014   CLINICAL DATA:  Fall.  Hypoxia.  EXAM: CT HEAD WITHOUT CONTRAST  CT CERVICAL SPINE WITHOUT CONTRAST  TECHNIQUE: Multidetector CT imaging of the head and cervical spine was performed following the standard protocol without intravenous contrast. Multiplanar CT image reconstructions of the cervical spine were also generated.  COMPARISON:  Report from 11/24/2000  FINDINGS: CT HEAD FINDINGS  Hypodense lesion in the right middle cranial fossa tracking up into the sylvian fissure, fluid density, probably an arachnoid cyst or epidermoid. Size 4.0 by 3.2 by approximately 6.0 cm.  Atherosclerosis noted. The brainstem, cerebellum, cerebral peduncle sits, thalami, and basal ganglia appear unremarkable. The ventricular system appears normal and aside from the right middle cranial fossa lesion the basilar cisterns appear normal.  Periventricular white matter and corona radiata hypodensities favor chronic ischemic microvascular white matter disease. No intracranial hemorrhage, mass lesion, or acute CVA. There is a subcutaneous lesion along the occiput/upper neck in the  midline on image 1 of series 3 which could represent subcutaneous bruising. Chronic left frontal sinusitis.  CT CERVICAL SPINE FINDINGS  Considerable degenerative findings at the craniocervical junction and anterior arch of C1-2 with extensive spurring of the basion and pannus formation posterior to the odontoid. Prominent loss of articular space at the anterior C1-2 articulation.  Uncinate and facet spurring cause osseous foraminal stenosis on the right at C2-3 and on the left at C3-4, C4-5, and C6-7. There is interbody fusion at the C5-6 low-level and facet fusion on the left at C4-5 and C7-T1.  There is 2 mm degenerative anterolisthesis at C4-5 and at C7-T1. Degenerative endplate sclerosis  noted with posterior osseous ridging at C6-7, C7-T1, and T1-2.  No prevertebral soft tissue swelling. No acute fracture is identified. There is dextroconvex cervical scoliosis.  Prominent emphysema and scarring noted at the lung apices.  IMPRESSION: 1. Right middle cranial fossa arachnoid cyst or epidermoid tracking into the sylvian fissure. MRI can typically differentiate between these 2 entities if clinically warranted. 2. Atherosclerosis. 3. Dense subcutaneous lesion along the occiput but and upper neck could represent some focal bruising. 4. Chronic left frontal sinusitis. 5. Cervical spondylosis causes multilevel osseous foraminal impingement. 6. Considerable spurring at the C1- 2 articulation and along the basion, with pannus formation posterior to the odontoid-rheumatoid arthropathy not excluded. 7. Emphysema.   Electronically Signed   By: Herbie Baltimore M.D.   On: 06/20/2014 13:36   Dg Chest Portable 1 View  06/20/2014   CLINICAL DATA:  Fall.  Pneumonia.  Shortness of breath.  Confusion.  EXAM: PORTABLE CHEST - 1 VIEW  COMPARISON:  05/19/2014  FINDINGS: Diffuse interstitial and patchy airspace opacities are present superimposed on emphysema. Atherosclerotic aortic arch and prior CABG. Heart size within normal  limits for projection. Stable biapical pleural parenchymal scarring. No pneumothorax observed. No definite blunting of the costophrenic angles.  IMPRESSION: 1. Bilateral interstitial and airspace opacities, worsened, superimposed on severe emphysema. Appearance could reflect edema or atypical pneumonia.   Electronically Signed   By: Herbie Baltimore M.D.   On: 06/20/2014 12:09     EKG Interpretation   Date/Time:  Monday June 20 2014 12:09:08 EST Ventricular Rate:  61 PR Interval:  193 QRS Duration: 109 QT Interval:  485 QTC Calculation: 489 R Axis:   95 Text Interpretation:  Sinus rhythm Right axis deviation Nonspecific T  abnrm, anterolateral leads Borderline prolonged QT interval No significant  change was found Confirmed by Manus Gunning  MD, Shuayb Schepers [phone]) on 06/20/2014  2:37:59 PM      MDM   Final diagnoses:  Fall  Hypoxia  Chronic obstructive pulmonary disease with acute exacerbation   Fall 3 days ago with shortness of breath and confusion. Hypoxia to 63% on room air.  NRB placed.  EKG unchanged.  No chest pain.  CXR with bilateral airspace opacities versus edema.  Recent hospitalization for pneumonia and completed levaquin. ABG without CO2 retention.  Cr at baseline.  No fever or cough at home.  Treat with antibitoics, lasix.  Normal EF on last echo.  Will gently diurese.  Hypoxia and respiratory failure likely due to underlying lung disease, possible PNA, possible CHF.  Unable to give IV contrast due to CKD. No tachycardia to suggest PE. Given fall on eliquis, trauma imaging obtained.  CT head with arachnoid cyst, no hemorrhage.  No intraabdominal hemorrhage. CT chest with atypical lung nodule. 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  No chest pain or back pain now.  Remains on NRB, no distress.  Admission d/w Dr. Arbutus Leas.  CRITICAL CARE Performed by: Glynn Octave Total critical care time: 30 Critical care time was exclusive of separately  billable procedures and treating other patients. Critical care was necessary to treat or prevent imminent or life-threatening deterioration. Critical care was time spent personally by me on the following activities: development of treatment plan with patient and/or surrogate as well as nursing, discussions with consultants, evaluation of patient's response to treatment, examination of patient, obtaining history from patient or surrogate, ordering and performing treatments and interventions, ordering and review of laboratory studies, ordering and review of radiographic studies, pulse oximetry and re-evaluation  of patient's condition.   Glynn Octave, MD 06/20/14 [phone]

## 2014-06-20 NOTE — ED Notes (Signed)
Pt hx leg paralysis. Was seen in ED 1 month ago, treated for PNA, pt was doing PT, pt fell 12/18. Pt then started having increased confusion according to wife. Pt has no sensation below waste. Unable to determine dysuria. Pt went to pcp today was 80% on room air. pcp sent pt to ED. Pt was 63% on room air when arrival to room. After several minutes 100% on non rebreather. Pt remained alert and oriented x4 entire time.

## 2014-06-20 NOTE — H&P (Signed)
History and Physical  [name] [name] ZOX:[id number] DOB: 11/06/1928 DOA: 06/20/2014   PCP: [name],[name], MD   Chief Complaint: Shortness of breath  HPI:  80 year old male with a history of COPD, paroxysmal A. fib ablation, CKD stage III, diabetes mellitus, hypertension, thoracic aortic aneurysm repair presents with 3 day history of worsening shortness of breath. Notably, the patient was recently admitted to the hospital from November 18 through 05/21/2014 for pneumonia. The patient was discharged home with levofloxacin. The patient presented to his primary care physician's office today when he was noted to have oxygen saturation of 80%. He was sent to the emergency department. In the emergency department, the patient was noted to have oxygen saturation of 63% on room air. The patient was placed on a nonrebreather with oxygen saturation in the mid 90s. He is not in any distress presently. The patient has not had any fevers or chills, but has had some low temperature readings at home with a temperature of 96.13F. The patient's wife also supplements the history and states the patient has been confused for the past 3 days. The patient is a former smoker with 30-pack-year history. There is no history of chest pain, vomiting, diarrhea, abdominal pain, dysuria, hematuria, back pain.  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 He has to self catheterize 3 times daily due to neurogenic bladder. At baseline, the patient was previously using a rolling walker family, but since his discharge from the hospital at the end of November 2015, the patient has been wheelchair bound. When he was discharged from the hospital in November, the patient was not on any oxygen. In the emergency department, chest x-ray showed bilateral interstitial and airspace opacities with severe emphysema. CT of the chest showed "mixed density surrounding tubular  calcification in the ascending thoracic aorta potentially from chronic dissection, chronic mural thrombus, or even intramural hematoma. Some of this appearance could be from an ascending aortic graft". CT of the abdomen and pelvis was negative for any acute findings, but it does show prominent stool. CT of the brain showed a right middle cranial fossa arachnoid cyst versus epidermoid cyst.  Assessment/Plan: Acute respiratory failure -Likely multifactorial with the possibility of pulmonary edema, pneumonia (HCAP), and consideration for underlying ILD or BOOP -The patient is stable on a NRB without distress presently -Treat with intravenous furosemide and intravenous antibiotics -If there is no clinical improvement, may need to consider ILD/BOOP Pulmonary opacities/infiltrates -We'll give an additional dose of furosemide this evening -Revaluate the patient's pulmonary status in the morning for further diuresis -Intravenous vancomycin and Zosyn -Blood cultures 2 sets--Unfortunately, this was not initially ordered, and Zosyn was given prior to collection of the blood cultures -check procalcitonin -Continue bronchodilators Elevated proBNP  -Although the patient has CKD stage III which may elevate BNP, this is more elevated than his prior admission  -Although the patient has some JVD on examination, he does not appear to be floridly fluid overloaded  -We'll give additional dose of furosemide IV this evening and reassess pulmonary status in the morning  -echo -11/02/2013 echo shows EF 55-60%, no WMA -I/O's, daily weights Pyuria -TNTC WBC in urine  -continue zosyn pending culture data CKD stage III  -Baseline creatinine 1.7-2.1  -Monitor renal function on diuresis  Altered mental status -The patient is A&Ox3 in ED, but wife states he is not at baseline -multifactorial including infection and hypoxia -CT brain neg for acute findings  COPD  -Cannot rule out possibility of underlying   BOOP/ILD -continue bronchodilators -no wheezing presently Paroxysmal Atrial fibrillation  -Rate controlled  -Presently in sinus rhythm  -Discontinue apixiban given the patient's recent history of multiple falls  -Start aspirin  -Continue amiodarone  Diabetes mellitus type 2  -05/18/2014 hemoglobin A1c 6.5  -Give half home dose of Lantus  -NovoLog sliding scale  Hypertension  -Continue metoprolol tartrate  -Hold amlodipine pending blood pressure response with diuresis  -Hold Cardura coronary artery disease  -Patient had CABG in 2006 -Currently chest pain-free -Continue metoprolol tartrate       Past Medical History  Diagnosis Date  . Peripheral vascular disease     critical limb ischemia  . Myocardial infarction   . Diabetes mellitus without complication   . Hypertension   . Coronary artery disease   . Atrial fibrillation   . Aortic dissection     w/ Cardiac tamponade  . AVM (arteriovenous malformation)   . Murmur   . Hyperlipidemia   . Neuropathy of left lower extremity   . S/P CABG (coronary artery bypass graft)    Past Surgical History  Procedure Laterality Date  . Coronary artery bypass graft  02/11/2005    LIMA to LAD, SVG to 1st diag, SVG to OM1, and SVG to PD  . Back surgery    . Atherectomy  09/15/2012    Diamondback orbital rotation atherectomy was performed using a 2 mm solid crown up to a max of 120,000 RPM. Angioplasty was performed using a 5x139mm long chocolate balloon for 2 minute inflations. Resulting in reduction of an 80% calcified distal R SFA stenosis to less than 20%  . Lea doppler  10/01/2012    R SFA demonstrated a mild amount of residual plaque suggesting less then 50% diametere reduction. R Calf runoff-one vessel runoff via peroneal artery. The posterior and anterior tibial arteries appeared occluded.  . Cardiac catheterization  02/04/2005    Severe 3-vessel disease affecting LAD and RCA. Consider CABG  . Cardiovascular stress test  12/16/2006     EKG negative for ischemia. No ECG changes. No significant ischemia demonstrated  . Transthoracic echocardiogram  12/04/2011    EF 50-55%, moderate aortic root dilation  . Atherectomy N/A 09/15/2012    Procedure: ATHERECTOMY;  Surgeon: Runell Gess, MD;  Location: [facility] CATH LAB;  Service: Cardiovascular;  Laterality: N/A;   Social History:  reports that he quit smoking about 22 years ago. He has never used smokeless tobacco. He reports that he drinks alcohol. He reports that he does not use illicit drugs.   History reviewed. No pertinent family history.   Allergies  Allergen Reactions  . Cymbalta [Duloxetine Hcl] Other (See Comments)    Confused, paralyzed from waist up   . Statins Other (See Comments)    Leg pains      Prior to Admission medications   Medication Sig Start Date End Date Taking? Authorizing Provider  amiodarone (PACERONE) 200 MG tablet Take 1 tablet (200 mg total) by mouth daily. Patient taking differently: Take 200 mg by mouth every evening.  12/16/13  Yes Lennette Bihari, MD  amLODipine (NORVASC) 5 MG tablet Take 1 tablet (5 mg total) by mouth daily. Takes 1/2 tablet daily Patient taking differently: Take 5 mg by mouth daily.  03/31/14  Yes Lennette Bihari, MD  apixaban (ELIQUIS) 2.5 MG TABS tablet Take 1 tablet (2.5 mg total) by mouth 2 (two) times daily. 10/28/13  Yes Lennette Bihari, MD  aspirin 81 MG tablet Take 81 mg by mouth daily with breakfast.    Yes Historical Provider, MD  calcium carbonate (OS-CAL - DOSED IN MG OF ELEMENTAL CALCIUM) 1250 MG tablet Take 1 tablet by mouth 2 (two) times daily with a meal.    Yes Historical Provider, MD  doxazosin (CARDURA) 2 MG tablet Take 1.5 tablets (3 mg total) by mouth at bedtime. Patient taking differently: Take 2 mg by mouth at bedtime.  11/30/13  Yes Lennette Biharihomas A Kelly, MD  ezetimibe (ZETIA) 10 MG tablet Take 1 tablet (10 mg total) by mouth daily. Patient taking differently: Take 10 mg by mouth daily with breakfast.  12/29/12  Yes  Runell GessJonathan J Berry, MD  ferrous sulfate 325 (65 FE) MG tablet Take 1 tablet (325 mg total) by mouth daily with breakfast. 05/21/14  Yes Maryann Mikhail, DO  gabapentin (NEURONTIN) 300 MG capsule Take 300-600 mg by mouth See admin instructions. 2 tabs every morning, 1 tab in the afternoon and 2 tabs at bedtime   Yes Historical Provider, MD  insulin glargine (LANTUS) 100 UNIT/ML injection Inject 20 Units into the skin daily with breakfast.    Yes Historical Provider, MD  metoprolol (LOPRESSOR) 50 MG tablet Take 50 mg by mouth 2 (two) times daily. 10/28/13  Yes Lennette Biharihomas A Kelly, MD  MILK THISTLE PO Take 1 tablet by mouth daily with breakfast.   Yes Historical Provider, MD  Multiple Vitamin (MULTIVITAMIN WITH MINERALS) TABS tablet Take 1 tablet by mouth daily.   Yes Historical Provider, MD  Omega-3 Fatty Acids (FISH OIL PO) Take 1 capsule by mouth 2 (two) times daily.   Yes Historical Provider, MD  oxyCODONE-acetaminophen (PERCOCET/ROXICET) 5-325 MG per tablet Take 1-2 tablets by mouth See admin instructions. 2 tabs every morning, 1 tab in the afternoon and 2 tabs at bedtime   Yes Historical Provider, MD  Probiotic Product (PROBIOTIC DAILY) CAPS Take 1 capsule by mouth every evening.   Yes Historical Provider, MD  levofloxacin (LEVAQUIN) 500 MG tablet Take 1 tablet (500 mg total) by mouth every other day. Patient not taking: Reported on 06/20/2014 05/21/14   Edsel PetrinMaryann Mikhail, DO    Review of Systems:  Constitutional:  No weight loss, night sweats, Fevers, chills  Head&Eyes: No headache.  No vision loss.  No eye pain or scotoma ENT:  No Difficulty swallowing,Tooth/dental problems,Sore throat,   Cardio-vascular:  No chest pain, Orthopnea, PND, swelling in lower extremities,  dizziness, palpitations  GI:  No  abdominal pain, nausea, vomiting, diarrhea, loss of appetite, hematochezia, melena, heartburn, indigestion, Resp:  No coughing up of blood .No wheezing.No chest wall deformity  Skin:  no rash or  lesions.  GU:  no dysuria, change in color of urine, no urgency or frequency. No flank pain.  Musculoskeletal:  No joint pain or swelling. No decreased range of motion. No back pain.  Psych:  No change in mood or affect. No depression or anxiety. Neurologic: No headache, no dysesthesia, no focal weakness, no vision loss. No syncope  Physical Exam: Filed Vitals:   06/20/14 1139 06/20/14 1145 06/20/14 1433  BP: 160/64  119/59  Pulse: 72  61  Resp: 24  18  SpO2: 63% 100% 100%   General:  A&O x 3, NAD, nontoxic, pleasant/cooperative Head/Eye: No conjunctival hemorrhage, no icterus, [place]/AT, No nystagmus ENT:  No icterus,  No thrush, good dentition, no pharyngeal exudate Neck:  No masses, no lymphadenpathy, no bruits CV:  RRR, no rub, no gallop, no S3; +JVD Lung:  Bilateral crackles, left greater than right. No wheezing Abdomen: soft/NT, +BS, nondistended, no peritoneal signs Ext: No cyanosis, No rashes, No petechiae, No lymphangitis, trace LE edema Neuro: CNII-XII intact, strength 4/5 in bilateral upper and lower extremities, no dysmetria  Labs on Admission:  Basic Metabolic Panel:  Recent Labs Lab 06/20/14 1201  NA 145  K 4.5  CL 108  CO2 19  GLUCOSE 208*  BUN 41*  CREATININE 2.09*  CALCIUM 9.4   Liver Function Tests:  Recent Labs Lab 06/20/14 1201  AST 26  ALT 15  ALKPHOS 85  BILITOT 1.0  PROT 8.2  ALBUMIN 3.3*   No results for input(s): LIPASE, AMYLASE in the last 168 hours. No results for input(s): AMMONIA in the last 168 hours. CBC:  Recent Labs Lab 06/20/14 1201  WBC 11.8*  NEUTROABS 9.9*  HGB 10.0*  HCT 32.7*  MCV 94.2  PLT 211   Cardiac Enzymes:  Recent Labs Lab 06/20/14 1201  TROPONINI <0.30   BNP: Invalid input(s): POCBNP CBG: No results for input(s): GLUCAP in the last 168 hours.  Radiological Exams on Admission: Ct Abdomen Pelvis Wo Contrast  06/20/2014   CLINICAL DATA:  Fall in bathroom. Anti coagulation. Increased confusion.   EXAM: CT CHEST, ABDOMEN AND PELVIS WITHOUT CONTRAST  TECHNIQUE: Multidetector CT imaging of the chest, abdomen and pelvis was performed following the standard protocol without IV contrast.  COMPARISON:  Multiple exams, including 05/19/2014 and 03/26/2005  FINDINGS: CT CHEST FINDINGS  Right upper paratracheal node 1.2 cm in short axis, image 12 series 2. Subcarinal node 1.4 cm in short axis, image 34 series 2. Additional paratracheal nodes are present. If hilar nodes are enlarged there difficult to measure against the background vasculature.  Aortic and branch vessel atherosclerosis. Prior CABG. Ascending thoracic aorta 4.6 cm transverse ; descending thoracic aorta 4.6 cm transverse. Lower descending thoracic aorta 5.0 cm transverse. There is mixed density surrounding tubular calcification in the ascending thoracic aorta potentially from chronic dissection, chronic mural thrombus, or even intramural hematoma. Some of this appearance could be from an ascending aortic graft-correlate with patient history. Without IV contrast did is difficult to characterize this further.  Trace left pleural effusion. Mild enlargement of the cardiopericardial silhouette  Emphysema is present with scattered scarring and peripheral fibrosis potentially with some early honeycombing at the lung bases. In addition, there is a 2.4 by 1.4 cm nodule anteriorly in the right upper lobe, image 25 of series 5 interstitial accentuation is present bilaterally.  CT ABDOMEN AND PELVIS FINDINGS  Hepatobiliary: Unremarkable  Pancreas: Speckled calcifications throughout the pancreatic parenchyma compatible with chronic calcific pancreatitis.  Spleen: Unremarkable  Adrenals/Urinary Tract: 8 mm left kidney lower pole nonobstructive calculus. Bilateral fluid density renal lesions favoring cysts. No hydronephrosis or hydroureter. No ureteral calculi.  Stomach/Bowel: Prominent stool throughout the colon favors constipation.  Vascular/Lymphatic: Dense  aortoiliac atherosclerosis.  Reproductive: Enlarged prostate gland with scattered calcifications, measuring 5.8 by 4.6 cm.  Other: No supplemental non-categorized findings.  Musculoskeletal: Bridging fusion of the sacroiliac joints. posterolateral rod and pedicle screw fixation at L4-5 with interbody and anterior facet fusion. Intervertebral and facet spurring at L3-4 and L5-S1. Small bilateral inguinal hernias contain adipose tissue.  IMPRESSION: 1. 2.4 by 1.4 cm right upper lobe pulmonary nodule with right paratracheal adenopathy, concerning for malignancy. An atypical infectious process could potentially give this appearance. Consider nuclear medicine PET-CT. 2. Severe emphysema with peripheral fibrosis, interstitial accentuation, and some basilar honeycombing. 3. Ascending and descending thoracic aortic aneurysms measuring  up to 5 cm transversely. There is a mixed density in the ascending aortic aneurysm with a complex appearance, possibly from graft material and excluded thrombus, chronic mural thrombus, chronic dissection, or intramural hematoma. Correlate with operative history. If IV contrast could not be administered, MRI might help further assess the thoracic aorta. Otherwise I recommend semi-annual imaging followup by CTA or MRA and referral to cardiothoracic surgery if not already obtained. This recommendation follows 2010 ACCF/AHA/AATS/ACR/ASA/SCA/SCAI/SIR/STS/SVM Guidelines for the Diagnosis and Management of Patients With Thoracic Aortic Disease. Circulation. 2010; 121: V564-P329. 4. Trace left pleural effusion. 5. Chronic calcific pancreatitis. 6.  Prominent stool throughout the colon favors constipation. 7. Enlarged prostate gland. 8. Lumbar spondylosis particularly at L3-4 and L5-S1.   Electronically Signed   By: Herbie Baltimore M.D.   On: 06/20/2014 13:58   Ct Head Wo Contrast  06/20/2014   CLINICAL DATA:  Fall.  Hypoxia.  EXAM: CT HEAD WITHOUT CONTRAST  CT CERVICAL SPINE WITHOUT CONTRAST   TECHNIQUE: Multidetector CT imaging of the head and cervical spine was performed following the standard protocol without intravenous contrast. Multiplanar CT image reconstructions of the cervical spine were also generated.  COMPARISON:  Report from 11/24/2000  FINDINGS: CT HEAD FINDINGS  Hypodense lesion in the right middle cranial fossa tracking up into the sylvian fissure, fluid density, probably an arachnoid cyst or epidermoid. Size 4.0 by 3.2 by approximately 6.0 cm.  Atherosclerosis noted. The brainstem, cerebellum, cerebral peduncle sits, thalami, and basal ganglia appear unremarkable. The ventricular system appears normal and aside from the right middle cranial fossa lesion the basilar cisterns appear normal.  Periventricular white matter and corona radiata hypodensities favor chronic ischemic microvascular white matter disease. No intracranial hemorrhage, mass lesion, or acute CVA. There is a subcutaneous lesion along the occiput/upper neck in the midline on image 1 of series 3 which could represent subcutaneous bruising. Chronic left frontal sinusitis.  CT CERVICAL SPINE FINDINGS  Considerable degenerative findings at the craniocervical junction and anterior arch of C1-2 with extensive spurring of the basion and pannus formation posterior to the odontoid. Prominent loss of articular space at the anterior C1-2 articulation.  Uncinate and facet spurring cause osseous foraminal stenosis on the right at C2-3 and on the left at C3-4, C4-5, and C6-7. There is interbody fusion at the C5-6 low-level and facet fusion on the left at C4-5 and C7-T1.  There is 2 mm degenerative anterolisthesis at C4-5 and at C7-T1. Degenerative endplate sclerosis noted with posterior osseous ridging at C6-7, C7-T1, and T1-2.  No prevertebral soft tissue swelling. No acute fracture is identified. There is dextroconvex cervical scoliosis.  Prominent emphysema and scarring noted at the lung apices.  IMPRESSION: 1. Right middle cranial  fossa arachnoid cyst or epidermoid tracking into the sylvian fissure. MRI can typically differentiate between these 2 entities if clinically warranted. 2. Atherosclerosis. 3. Dense subcutaneous lesion along the occiput but and upper neck could represent some focal bruising. 4. Chronic left frontal sinusitis. 5. Cervical spondylosis causes multilevel osseous foraminal impingement. 6. Considerable spurring at the C1- 2 articulation and along the basion, with pannus formation posterior to the odontoid-rheumatoid arthropathy not excluded. 7. Emphysema.   Electronically Signed   By: Herbie Baltimore M.D.   On: 06/20/2014 13:36   Ct Chest Wo Contrast  06/20/2014   CLINICAL DATA:  Fall in bathroom. Anti coagulation. Increased confusion.  EXAM: CT CHEST, ABDOMEN AND PELVIS WITHOUT CONTRAST  TECHNIQUE: Multidetector CT imaging of the chest, abdomen and pelvis was performed following  the standard protocol without IV contrast.  COMPARISON:  Multiple exams, including 05/19/2014 and 03/26/2005  FINDINGS: CT CHEST FINDINGS  Right upper paratracheal node 1.2 cm in short axis, image 12 series 2. Subcarinal node 1.4 cm in short axis, image 34 series 2. Additional paratracheal nodes are present. If hilar nodes are enlarged there difficult to measure against the background vasculature.  Aortic and branch vessel atherosclerosis. Prior CABG. Ascending thoracic aorta 4.6 cm transverse ; descending thoracic aorta 4.6 cm transverse. Lower descending thoracic aorta 5.0 cm transverse. There is mixed density surrounding tubular calcification in the ascending thoracic aorta potentially from chronic dissection, chronic mural thrombus, or even intramural hematoma. Some of this appearance could be from an ascending aortic graft-correlate with patient history. Without IV contrast did is difficult to characterize this further.  Trace left pleural effusion. Mild enlargement of the cardiopericardial silhouette  Emphysema is present with scattered  scarring and peripheral fibrosis potentially with some early honeycombing at the lung bases. In addition, there is a 2.4 by 1.4 cm nodule anteriorly in the right upper lobe, image 25 of series 5 interstitial accentuation is present bilaterally.  CT ABDOMEN AND PELVIS FINDINGS  Hepatobiliary: Unremarkable  Pancreas: Speckled calcifications throughout the pancreatic parenchyma compatible with chronic calcific pancreatitis.  Spleen: Unremarkable  Adrenals/Urinary Tract: 8 mm left kidney lower pole nonobstructive calculus. Bilateral fluid density renal lesions favoring cysts. No hydronephrosis or hydroureter. No ureteral calculi.  Stomach/Bowel: Prominent stool throughout the colon favors constipation.  Vascular/Lymphatic: Dense aortoiliac atherosclerosis.  Reproductive: Enlarged prostate gland with scattered calcifications, measuring 5.8 by 4.6 cm.  Other: No supplemental non-categorized findings.  Musculoskeletal: Bridging fusion of the sacroiliac joints. posterolateral rod and pedicle screw fixation at L4-5 with interbody and anterior facet fusion. Intervertebral and facet spurring at L3-4 and L5-S1. Small bilateral inguinal hernias contain adipose tissue.  IMPRESSION: 1. 2.4 by 1.4 cm right upper lobe pulmonary nodule with right paratracheal adenopathy, concerning for malignancy. An atypical infectious process could potentially give this appearance. Consider nuclear medicine PET-CT. 2. Severe emphysema with peripheral fibrosis, interstitial accentuation, and some basilar honeycombing. 3. Ascending and descending thoracic aortic aneurysms measuring up to 5 cm transversely. There is a mixed density in the ascending aortic aneurysm with a complex appearance, possibly from graft material and excluded thrombus, chronic mural thrombus, chronic dissection, or intramural hematoma. Correlate with operative history. If IV contrast could not be administered, MRI might help further assess the thoracic aorta. Otherwise I  recommend semi-annual imaging followup by CTA or MRA and referral to cardiothoracic surgery if not already obtained. This recommendation follows 2010 ACCF/AHA/AATS/ACR/ASA/SCA/SCAI/SIR/STS/SVM Guidelines for the Diagnosis and Management of Patients With Thoracic Aortic Disease. Circulation. 2010; 121: Z610-R604. 4. Trace left pleural effusion. 5. Chronic calcific pancreatitis. 6.  Prominent stool throughout the colon favors constipation. 7. Enlarged prostate gland. 8. Lumbar spondylosis particularly at L3-4 and L5-S1.   Electronically Signed   By: Herbie Baltimore M.D.   On: 06/20/2014 13:58   Ct Cervical Spine Wo Contrast  06/20/2014   CLINICAL DATA:  Fall.  Hypoxia.  EXAM: CT HEAD WITHOUT CONTRAST  CT CERVICAL SPINE WITHOUT CONTRAST  TECHNIQUE: Multidetector CT imaging of the head and cervical spine was performed following the standard protocol without intravenous contrast. Multiplanar CT image reconstructions of the cervical spine were also generated.  COMPARISON:  Report from 11/24/2000  FINDINGS: CT HEAD FINDINGS  Hypodense lesion in the right middle cranial fossa tracking up into the sylvian fissure, fluid density, probably an arachnoid cyst or  epidermoid. Size 4.0 by 3.2 by approximately 6.0 cm.  Atherosclerosis noted. The brainstem, cerebellum, cerebral peduncle sits, thalami, and basal ganglia appear unremarkable. The ventricular system appears normal and aside from the right middle cranial fossa lesion the basilar cisterns appear normal.  Periventricular white matter and corona radiata hypodensities favor chronic ischemic microvascular white matter disease. No intracranial hemorrhage, mass lesion, or acute CVA. There is a subcutaneous lesion along the occiput/upper neck in the midline on image 1 of series 3 which could represent subcutaneous bruising. Chronic left frontal sinusitis.  CT CERVICAL SPINE FINDINGS  Considerable degenerative findings at the craniocervical junction and anterior arch of C1-2  with extensive spurring of the basion and pannus formation posterior to the odontoid. Prominent loss of articular space at the anterior C1-2 articulation.  Uncinate and facet spurring cause osseous foraminal stenosis on the right at C2-3 and on the left at C3-4, C4-5, and C6-7. There is interbody fusion at the C5-6 low-level and facet fusion on the left at C4-5 and C7-T1.  There is 2 mm degenerative anterolisthesis at C4-5 and at C7-T1. Degenerative endplate sclerosis noted with posterior osseous ridging at C6-7, C7-T1, and T1-2.  No prevertebral soft tissue swelling. No acute fracture is identified. There is dextroconvex cervical scoliosis.  Prominent emphysema and scarring noted at the lung apices.  IMPRESSION: 1. Right middle cranial fossa arachnoid cyst or epidermoid tracking into the sylvian fissure. MRI can typically differentiate between these 2 entities if clinically warranted. 2. Atherosclerosis. 3. Dense subcutaneous lesion along the occiput but and upper neck could represent some focal bruising. 4. Chronic left frontal sinusitis. 5. Cervical spondylosis causes multilevel osseous foraminal impingement. 6. Considerable spurring at the C1- 2 articulation and along the basion, with pannus formation posterior to the odontoid-rheumatoid arthropathy not excluded. 7. Emphysema.   Electronically Signed   By: Herbie Baltimore M.D.   On: 06/20/2014 13:36   Dg Chest Portable 1 View  06/20/2014   CLINICAL DATA:  Fall.  Pneumonia.  Shortness of breath.  Confusion.  EXAM: PORTABLE CHEST - 1 VIEW  COMPARISON:  05/19/2014  FINDINGS: Diffuse interstitial and patchy airspace opacities are present superimposed on emphysema. Atherosclerotic aortic arch and prior CABG. Heart size within normal limits for projection. Stable biapical pleural parenchymal scarring. No pneumothorax observed. No definite blunting of the costophrenic angles.  IMPRESSION: 1. Bilateral interstitial and airspace opacities, worsened, superimposed on  severe emphysema. Appearance could reflect edema or atypical pneumonia.   Electronically Signed   By: Herbie Baltimore M.D.   On: 06/20/2014 12:09    EKG: Independently reviewed. Sinus rhythm, nonspecific T-wave changes    Time spent:70 minutes Code Status:   FULL Family Communication:   Wife updated at bedside   Kamylle Axelson, DO  [facility] Pager [phone]  If 7PM-7AM, please contact night-coverage [email or web address] Password [facility] 06/20/2014, 3:48 PM

## 2014-06-20 NOTE — ED Notes (Signed)
RN and MD made aware of lactic acid results of 2.26

## 2014-06-21 ENCOUNTER — Ambulatory Visit: Payer: Medicare Other

## 2014-06-21 DIAGNOSIS — J189 Pneumonia, unspecified organism: Secondary | ICD-10-CM

## 2014-06-21 DIAGNOSIS — J810 Acute pulmonary edema: Secondary | ICD-10-CM

## 2014-06-21 DIAGNOSIS — I379 Nonrheumatic pulmonary valve disorder, unspecified: Secondary | ICD-10-CM

## 2014-06-21 LAB — GLUCOSE, CAPILLARY
GLUCOSE-CAPILLARY: 297 mg/dL — AB (ref 70–99)
Glucose-Capillary: 104 mg/dL — ABNORMAL HIGH (ref 70–99)
Glucose-Capillary: 134 mg/dL — ABNORMAL HIGH (ref 70–99)
Glucose-Capillary: 216 mg/dL — ABNORMAL HIGH (ref 70–99)

## 2014-06-21 LAB — BASIC METABOLIC PANEL
Anion gap: 9 (ref 5–15)
BUN: 47 mg/dL — AB (ref 6–23)
CALCIUM: 8.4 mg/dL (ref 8.4–10.5)
CO2: 24 mmol/L (ref 19–32)
CREATININE: 2.49 mg/dL — AB (ref 0.50–1.35)
Chloride: 109 mEq/L (ref 96–112)
GFR calc Af Amer: 26 mL/min — ABNORMAL LOW (ref >90)
GFR, EST NON AFRICAN AMERICAN: 22 mL/min — AB (ref >90)
Glucose, Bld: 136 mg/dL — ABNORMAL HIGH (ref 70–99)
POTASSIUM: 4.1 mmol/L (ref 3.5–5.1)
Sodium: 142 mmol/L (ref 135–145)

## 2014-06-21 LAB — SEDIMENTATION RATE: Sed Rate: 75 mm/hr — ABNORMAL HIGH (ref 0–16)

## 2014-06-21 LAB — CBC
HEMATOCRIT: 29.4 % — AB (ref 39.0–52.0)
HEMOGLOBIN: 9 g/dL — AB (ref 13.0–17.0)
MCH: 28.9 pg (ref 26.0–34.0)
MCHC: 30.6 g/dL (ref 30.0–36.0)
MCV: 94.5 fL (ref 78.0–100.0)
Platelets: 197 10*3/uL (ref 150–400)
RBC: 3.11 MIL/uL — ABNORMAL LOW (ref 4.22–5.81)
RDW: 18 % — ABNORMAL HIGH (ref 11.5–15.5)
WBC: 10.5 10*3/uL (ref 4.0–10.5)

## 2014-06-21 LAB — C-REACTIVE PROTEIN: CRP: 17 mg/dL — AB (ref <0.60)

## 2014-06-21 LAB — RHEUMATOID FACTOR: Rhuematoid fact SerPl-aCnc: 18 IU/mL — ABNORMAL HIGH (ref <=14)

## 2014-06-21 MED ORDER — FUROSEMIDE 10 MG/ML IJ SOLN
40.0000 mg | Freq: Once | INTRAMUSCULAR | Status: AC
  Administered 2014-06-21: 40 mg via INTRAVENOUS
  Filled 2014-06-21: qty 4

## 2014-06-21 MED ORDER — DOCUSATE SODIUM 100 MG PO CAPS
100.0000 mg | ORAL_CAPSULE | Freq: Two times a day (BID) | ORAL | Status: DC
  Administered 2014-06-23 – 2014-06-25 (×6): 100 mg via ORAL
  Filled 2014-06-21 (×16): qty 1

## 2014-06-21 MED ORDER — METOPROLOL TARTRATE 25 MG PO TABS
25.0000 mg | ORAL_TABLET | Freq: Two times a day (BID) | ORAL | Status: DC
  Administered 2014-06-21 – 2014-06-26 (×10): 25 mg via ORAL
  Filled 2014-06-21 (×11): qty 1

## 2014-06-21 MED ORDER — DEXTROSE 5 % IV SOLN
500.0000 mg | INTRAVENOUS | Status: DC
  Administered 2014-06-21 – 2014-06-22 (×2): 500 mg via INTRAVENOUS
  Filled 2014-06-21 (×3): qty 500

## 2014-06-21 MED ORDER — VANCOMYCIN HCL IN DEXTROSE 750-5 MG/150ML-% IV SOLN
750.0000 mg | Freq: Every day | INTRAVENOUS | Status: DC
  Administered 2014-06-21: 750 mg via INTRAVENOUS
  Filled 2014-06-21: qty 150

## 2014-06-21 MED ORDER — BISACODYL 10 MG RE SUPP: 10.0000 mg | Freq: Once | RECTAL | Status: DC

## 2014-06-21 NOTE — Progress Notes (Addendum)
Pt and his wife refused the suppository and stool softener ordered by MD. RN asked pt if he told MD that he was constipated and he stated that he did. His wife asked why he told MD that because he is "always constipated." Wife manually relieves pt's bowels TID at home d/t his spinal injury that has caused him to loose sensation from the waist down. Pt also In and Out catheterizes himself four times a day. [name] and pt refused the medications because they stated that it would make the stool too soft to manually relieve bowels and it would make too much of a mess. Wife attempted to manually relieve bowels at this time, but reported to RN that there was nothing there at this time. The RN and the wife will continue to assess pt's bowel status.   Arta BruceDeutsch, Devera Englander [facility] CenterDEBRULER 06/21/2014 11:31 AM   [name] told RN that she was able to remove a "good size/amount" of stool from pt yesterday am and a small amount last night. She also stated that it was not unusual that she did not have any stool to remove this am as pt has been less physically active recently and that he did not eat much yesterday.  1:23 PM

## 2014-06-21 NOTE — Progress Notes (Signed)
Patient's heart rate dropping to the low to mid 40's while sleeping.  Patient is in no distress and easily awakens.  Will continue to closely monitor patient.

## 2014-06-21 NOTE — Consult Note (Signed)
PULMONARY / CRITICAL CARE MEDICINE   Name: [name] [name] MRN: [id number] DOB: 06/02/1929    ADMISSION DATE:  06/20/2014 CONSULTATION DATE:  06/21/2014  REFERRING MD :   Tat  CHIEF COMPLAINT: Shortness of breath  INITIAL PRESENTATION: [age] year old male with no prior pulmonary history was admitted on 06/20/2014 with acute hypoxemic respiratory failure after an upper respiratory infection syndrome.  STUDIES:   06/20/2014 CT chest> there is notable emphysema in the lungs bilaterally with honeycombing in the bases and interstitial thickening, overall picture worrisome for interstitial lung disease superimposed on a background of emphysema. See radiology report for other findings including aorta, status post grafting.  SIGNIFICANT EVENTS:   HISTORY OF PRESENT ILLNESSMR. Banks IS AN 80-YEAR-OLD MALE WHO SMOKED ONE PACK OF CIGARETTES DAILY FOR 35 YEARS WHO WAS ADMITTED TO [place] [facility] on 06/20/2014 with acute respiratory failure and hypoxemia. He tells me that one year ago he was feeling fine without problems with shortness of breath, however he is immobile secondary to severe leg weakness and so he does not exert himself very often. Over the last year he has been admitted to the hospital for pneumonia and treated. This occurred most recently in November. During that time he did not require oxygen therapy at hospital discharge. He says that approximately 2 weeks ago he started feeling a sudden onset of a cough with chest congestion. One week ago he started to develop worsening shortness of breath. In the midst of this, he had a fall and landed on his right side and bruised his ribs. Because of the right-sided chest pain he went to go see his primary care physician for further evaluation and at that visit was noted to have an O2 saturation of 80% on room air. He was sent to the emergency department, there he was noted to have an O2 saturation of 63%. He was placed on oxygen by facemask and  was admitted to the internal medicine service for further management.  He denies fevers, chills. He does note clear sputum production. He denies headache, body aches, or arthralgias. The only chest pain he notes is that which is associated with the rib pain from his recent fall. Of note, he has been treated with amiodarone for atrial fibrillation for the last year.  PAST MEDICAL HISTORY :   has a past medical history of Peripheral vascular disease; Myocardial infarction; Diabetes mellitus without complication; Hypertension; Coronary artery disease; Atrial fibrillation; Aortic dissection; AVM (arteriovenous malformation); Murmur; Hyperlipidemia; Neuropathy of left lower extremity; and S/P CABG (coronary artery bypass graft).  has past surgical history that includes Coronary artery bypass graft (02/11/2005); Back surgery; Atherectomy (09/15/2012); LEA DOPPLER (10/01/2012); Cardiac catheterization (02/04/2005); Cardiovascular stress test (12/16/2006); transthoracic echocardiogram (12/04/2011); and atherectomy (N/A, 09/15/2012). Prior to Admission medications   Medication Sig Start Date End Date Taking? Authorizing Provider  amiodarone (PACERONE) 200 MG tablet Take 1 tablet (200 mg total) by mouth daily. Patient taking differently: Take 200 mg by mouth every evening.  12/16/13  Yes Lennette Biharihomas A Kelly, MD  amLODipine (NORVASC) 5 MG tablet Take 1 tablet (5 mg total) by mouth daily. Takes 1/2 tablet daily Patient taking differently: Take 5 mg by mouth daily.  03/31/14  Yes Lennette Biharihomas A Kelly, MD  apixaban (ELIQUIS) 2.5 MG TABS tablet Take 1 tablet (2.5 mg total) by mouth 2 (two) times daily. 10/28/13  Yes Lennette Biharihomas A Kelly, MD  aspirin 81 MG tablet Take 81 mg by mouth daily with breakfast.    Yes Historical Provider, MD  calcium carbonate (OS-CAL - DOSED IN MG OF ELEMENTAL CALCIUM) 1250 MG tablet Take 1 tablet by mouth 2 (two) times daily with a meal.    Yes Historical Provider, MD  doxazosin (CARDURA) 2 MG tablet Take 1.5 tablets  (3 mg total) by mouth at bedtime. Patient taking differently: Take 2 mg by mouth at bedtime.  11/30/13  Yes Lennette Biharihomas A Kelly, MD  ezetimibe (ZETIA) 10 MG tablet Take 1 tablet (10 mg total) by mouth daily. Patient taking differently: Take 10 mg by mouth daily with breakfast.  12/29/12  Yes Runell GessJonathan J Berry, MD  ferrous sulfate 325 (65 FE) MG tablet Take 1 tablet (325 mg total) by mouth daily with breakfast. 05/21/14  Yes Maryann Mikhail, DO  gabapentin (NEURONTIN) 300 MG capsule Take 300-600 mg by mouth See admin instructions. 2 tabs every morning, 1 tab in the afternoon and 2 tabs at bedtime   Yes Historical Provider, MD  insulin glargine (LANTUS) 100 UNIT/ML injection Inject 20 Units into the skin daily with breakfast.    Yes Historical Provider, MD  metoprolol (LOPRESSOR) 50 MG tablet Take 50 mg by mouth 2 (two) times daily. 10/28/13  Yes Lennette Biharihomas A Kelly, MD  MILK THISTLE PO Take 1 tablet by mouth daily with breakfast.   Yes Historical Provider, MD  Multiple Vitamin (MULTIVITAMIN WITH MINERALS) TABS tablet Take 1 tablet by mouth daily.   Yes Historical Provider, MD  Omega-3 Fatty Acids (FISH OIL PO) Take 1 capsule by mouth 2 (two) times daily.   Yes Historical Provider, MD  oxyCODONE-acetaminophen (PERCOCET/ROXICET) 5-325 MG per tablet Take 1-2 tablets by mouth See admin instructions. 2 tabs every morning, 1 tab in the afternoon and 2 tabs at bedtime   Yes Historical Provider, MD  Probiotic Product (PROBIOTIC DAILY) CAPS Take 1 capsule by mouth every evening.   Yes Historical Provider, MD  levofloxacin (LEVAQUIN) 500 MG tablet Take 1 tablet (500 mg total) by mouth every other day. Patient not taking: Reported on 06/20/2014 05/21/14   Edsel PetrinMaryann Mikhail, DO   Allergies  Allergen Reactions  . Cymbalta [Duloxetine Hcl] Other (See Comments)    Confused, paralyzed from waist up   . Statins Other (See Comments)    Leg pains    FAMILY HISTORY:  indicated that his mother is deceased. He indicated that his  father is deceased.  SOCIAL HISTORY:  reports that he quit smoking about 22 years ago. He has never used smokeless tobacco. He reports that he drinks alcohol. He reports that he does not use illicit drugs.  REVIEW OF SYSTEMS:   Gen: Denies fever, chills, weight change, + fatigue, night sweats HEENT: Denies blurred vision, double vision, hearing loss, tinnitus, sinus congestion, rhinorrhea, sore throat, neck stiffness, dysphagia PULM: per HPI CV: Denies chest pain, edema, orthopnea, paroxysmal nocturnal dyspnea, palpitations GI: Denies abdominal pain, nausea, vomiting, diarrhea, hematochezia, melena, constipation, change in bowel habits GU: Denies dysuria, hematuria, polyuria, oliguria, urethral discharge Endocrine: Denies hot or cold intolerance, polyuria, polyphagia or appetite change Derm: Denies rash, dry skin, scaling or peeling skin change Heme: Denies easy bruising, bleeding, bleeding gums Neuro: Denies headache, numbness, chronic leg weakness, slurred speech, loss of memory or consciousness   SUBJECTIVE:   VITAL SIGNS: Temp:  [97.5 F (36.4 C)-98.1 F (36.7 C)] 98.1 F (36.7 C) (12/22 0454) Pulse Rate:  [47-70] 70 (12/22 0953) Resp:  [16-18] 16 (12/22 0454) BP: (112-145)/(46-65) 144/50 mmHg (12/22 0953) SpO2:  [88 %-100 %] 93 % (12/22 1012) FiO2 (%):  [50 %-  100 %] 100 % (12/22 1012) Weight:  [72.5 kg (159 lb 13.3 oz)-73.2 kg (161 lb 6 oz)] 73.2 kg (161 lb 6 oz) (12/22 0454) HEMODYNAMICS:   VENTILATOR SETTINGS: Vent Mode:  [-]  FiO2 (%):  [50 %-100 %] 100 % INTAKE / OUTPUT:  Intake/Output Summary (Last 24 hours) at 06/21/14 1214 Last data filed at 06/21/14 0900  Gross per 24 hour  Intake    780 ml  Output   1250 ml  Net   -470 ml    PHYSICAL EXAMINATION: General:  Comfortable in bed Neuro:  Awake and alert, seems easily distracted but provides reasonable history, chronic left foot weakness noted HEENT:  NCAT, EOMi Cardiovascular:  irreg irreg, no mgr Lungs:   Crackles bilaterally, no wheezing Abdomen:  BS+, soft, nontender Musculoskeletal:  Decreased tone and bulk left leg Skin:  No rash or skin breakdown  LABS:  CBC  Recent Labs Lab 06/20/14 1201 06/21/14 0455  WBC 11.8* 10.5  HGB 10.0* 9.0*  HCT 32.7* 29.4*  PLT 211 197   Coag's  Recent Labs Lab 06/20/14 1201  INR 1.41   BMET  Recent Labs Lab 06/20/14 1201 06/21/14 0455  NA 145 142  K 4.5 4.1  CL 108 109  CO2 19 24  BUN 41* 47*  CREATININE 2.09* 2.49*  GLUCOSE 208* 136*   Electrolytes  Recent Labs Lab 06/20/14 1201 06/21/14 0455  CALCIUM 9.4 8.4   Sepsis Markers  Recent Labs Lab 06/20/14 1207 06/20/14 1742  LATICACIDVEN 2.26*  --   PROCALCITON  --  0.67   ABG  Recent Labs Lab 06/20/14 1301  PHART 7.409  PCO2ART 34.0*  PO2ART 84.6   Liver Enzymes  Recent Labs Lab 06/20/14 1201  AST 26  ALT 15  ALKPHOS 85  BILITOT 1.0  ALBUMIN 3.3*   Cardiac Enzymes  Recent Labs Lab 06/20/14 1201 06/20/14 1202  TROPONINI <0.30  --   PROBNP  --  14250.0*   Glucose  Recent Labs Lab 06/20/14 1708 06/20/14 2148 06/21/14 0753 06/21/14 1158  GLUCAP 140* 209* 104* 297*    Imaging    ASSESSMENT / PLAN:  PULMONARY A: Acute hypoxemic respiratory failure: Given the acute nature of this infectious etiology versus pulmonary edema seems most likely. However, the CT chest is not classic for pulmonary edema and is worrisome for severe emphysema with an underlying interstitial process. If this process is chronic, then it would be most likely idiopathic pulmonary fibrosis considering his age group. However, this has progressed very rapidly so I favor infectious versus pulmonary edema at this time. Notably, he does take amiodarone which classically causes an organizing pneumonia picture, the CT findings are not consistent with that right now. It should be noted that amiodarone can cause a number of pulmonary toxicities and so it's worth stopping that if  possible considering his high O2 requirements. P:   -Check connective tissue disease panel -continue O2 as needed -diurese, see below -stop amiodarone -treat as pneumonia, see below  CARDIOVASCULAR A: Acute diastolic heart failure? Has elevated JVD, proBNP Afib  P:  -diuresis -may need cardiology to see him after stopping amiodarone (follows with Dr. Tresa Endo), would recommend rate control for now  INFECTIOUS A: HCAP? Consider atypical pneumonia P: -continue Abx per primary service -add azithro -add urine legionella ag -check influenza  Heber Alton, MD [facility] Pager: [phone] Cell: [phone] If no response, call [phone]   06/21/2014, 12:14 PM

## 2014-06-21 NOTE — Progress Notes (Signed)
PROGRESS NOTE  [name] [name] ZOX:[id number] DOB: 04-01-1929 DOA: 06/20/2014 PCP: [name], MD  Brief history 80 year old male with a history of COPD, paroxysmal A. fib, CKD stage III, diabetes mellitus, hypertension, thoracic aortic aneurysm repair presents with 3 day history of worsening shortness of breath. Notably, the patient was recently admitted to the hospital from November 18 through 05/21/2014 for pneumonia. The patient was discharged home with levofloxacin. The patient presented to his primary care physician's office on the day of admission when he was noted to have oxygen saturation of 80%. He was sent to the emergency department. In the emergency department, the patient was noted to have oxygen saturation of 63% on room air. CT of the chest showed "mixed density surrounding tubular calcification in the ascending thoracic aorta potentially from chronic dissection, chronic muralthrombus, or even intramural hematoma. Some of this appearance could be from an ascending aortic graft". CT of the abdomen and pelvis was negative for any acute findings, but it does show prominent stool.  Assessment/Plan: Acute respiratory failure -Likely multifactorial with the possibility of pulmonary edema, pneumonia (HCAP), and consideration for underlying ILD or BOOP -The patient is stable on a NRB without distress presently -Treat wit intravenous antibiotics -The patient did not have any significant clinical improvement with intravenous furosemide x 2 doses -consult pulmonary--spoke with Dr. Kendrick Fries Pulmonary opacities/infiltrates -No significant clinical improvement with intravenous furosemide 2 doses -Consult pulmonary -Intravenous vancomycin and Zosyn -Blood cultures 2 sets--Unfortunately, this was not initially ordered, and Zosyn was given prior to collection of the blood cultures -check procalcitonin-->0.67 -Continue bronchodilators Elevated proBNP  -Although the patient has  CKD stage III which may elevate BNP, this is more elevated than his prior admission  -Although the patient has some JVD on examination, he does not appear to be clinically fluid overloaded  -11/02/13 Echo did not show any RV dysfunction, TR, or pulm HTN  -repeat echo -11/02/2013 echo shows EF 55-60%, no WMA -I/O's, daily weights Pyuria -TNTC WBC in urine  -continue zosyn -unfortunately, urine culture was not set from ED CKD stage III  -Baseline creatinine 1.7-2.1  -Monitor renal function on diuresis  Altered mental status -likely due to pt infection/hypoxemia -resolved -The patient is A&Ox3 in ED -CT brain neg for acute findings COPD  -Cannot rule out possibility of underlying BOOP/ILD -continue bronchodilators -no wheezing presently Paroxysmal Atrial fibrillation  -Rate controlled  -Presently in sinus rhythm  -Discontinue apixiban given the patient's recent history of multiple falls  -Start aspirin  -Continue amiodarone  Diabetes mellitus type 2  -05/18/2014 hemoglobin A1c 6.5  -Give half home dose of Lantus  -NovoLog sliding scale  Hypertension  -Continue metoprolol tartrate  -Hold amlodipine -Hold Cardura coronary artery disease  -Patient had CABG in 2006 -Currently chest pain-free -Continue metoprolol tartrate  Thoracic Aortic Aneursym -s/p repair -changes on CT likely due to previous repair Constipation -colace and bisacodyl -may need enema if no improvement Family Communication:   Pt at beside Disposition Plan:   Home when medically stable     Antibiotics: Vancomycin 06/20/14>>> Zosyn 06/20/14>>>   Procedures/Studies: Ct Abdomen Pelvis Wo Contrast  06/20/2014   CLINICAL DATA:  Fall in bathroom. Anti coagulation. Increased confusion.  EXAM: CT CHEST, ABDOMEN AND PELVIS WITHOUT CONTRAST  TECHNIQUE: Multidetector CT imaging of the chest, abdomen and pelvis was performed following the standard protocol without IV contrast.  COMPARISON:   Multiple exams, including 05/19/2014 and 03/26/2005  FINDINGS: CT CHEST FINDINGS  Right upper paratracheal node 1.2 cm in short axis, image 12 series 2. Subcarinal node 1.4 cm in short axis, image 34 series 2. Additional paratracheal nodes are present. If hilar nodes are enlarged there difficult to measure against the background vasculature.  Aortic and branch vessel atherosclerosis. Prior CABG. Ascending thoracic aorta 4.6 cm transverse ; descending thoracic aorta 4.6 cm transverse. Lower descending thoracic aorta 5.0 cm transverse. There is mixed density surrounding tubular calcification in the ascending thoracic aorta potentially from chronic dissection, chronic mural thrombus, or even intramural hematoma. Some of this appearance could be from an ascending aortic graft-correlate with patient history. Without IV contrast did is difficult to characterize this further.  Trace left pleural effusion. Mild enlargement of the cardiopericardial silhouette  Emphysema is present with scattered scarring and peripheral fibrosis potentially with some early honeycombing at the lung bases. In addition, there is a 2.4 by 1.4 cm nodule anteriorly in the right upper lobe, image 25 of series 5 interstitial accentuation is present bilaterally.  CT ABDOMEN AND PELVIS FINDINGS  Hepatobiliary: Unremarkable  Pancreas: Speckled calcifications throughout the pancreatic parenchyma compatible with chronic calcific pancreatitis.  Spleen: Unremarkable  Adrenals/Urinary Tract: 8 mm left kidney lower pole nonobstructive calculus. Bilateral fluid density renal lesions favoring cysts. No hydronephrosis or hydroureter. No ureteral calculi.  Stomach/Bowel: Prominent stool throughout the colon favors constipation.  Vascular/Lymphatic: Dense aortoiliac atherosclerosis.  Reproductive: Enlarged prostate gland with scattered calcifications, measuring 5.8 by 4.6 cm.  Other: No supplemental non-categorized findings.  Musculoskeletal: Bridging fusion of  the sacroiliac joints. posterolateral rod and pedicle screw fixation at L4-5 with interbody and anterior facet fusion. Intervertebral and facet spurring at L3-4 and L5-S1. Small bilateral inguinal hernias contain adipose tissue.  IMPRESSION: 1. 2.4 by 1.4 cm right upper lobe pulmonary nodule with right paratracheal adenopathy, concerning for malignancy. An atypical infectious process could potentially give this appearance. Consider nuclear medicine PET-CT. 2. Severe emphysema with peripheral fibrosis, interstitial accentuation, and some basilar honeycombing. 3. Ascending and descending thoracic aortic aneurysms measuring up to 5 cm transversely. There is a mixed density in the ascending aortic aneurysm with a complex appearance, possibly from graft material and excluded thrombus, chronic mural thrombus, chronic dissection, or intramural hematoma. Correlate with operative history. If IV contrast could not be administered, MRI might help further assess the thoracic aorta. Otherwise I recommend semi-annual imaging followup by CTA or MRA and referral to cardiothoracic surgery if not already obtained. This recommendation follows 2010 ACCF/AHA/AATS/ACR/ASA/SCA/SCAI/SIR/STS/SVM Guidelines for the Diagnosis and Management of Patients With Thoracic Aortic Disease. Circulation. 2010; 121: [id number]. 4. Trace left pleural effusion. 5. Chronic calcific pancreatitis. 6.  Prominent stool throughout the colon favors constipation. 7. Enlarged prostate gland. 8. Lumbar spondylosis particularly at L3-4 and L5-S1.   Electronically Signed   By: Herbie BaltimoreWalt  Liebkemann M.D.   On: 06/20/2014 13:58   Ct Head Wo Contrast  06/20/2014   CLINICAL DATA:  Fall.  Hypoxia.  EXAM: CT HEAD WITHOUT CONTRAST  CT CERVICAL SPINE WITHOUT CONTRAST  TECHNIQUE: Multidetector CT imaging of the head and cervical spine was performed following the standard protocol without intravenous contrast. Multiplanar CT image reconstructions of the cervical spine were also  generated.  COMPARISON:  Report from 11/24/2000  FINDINGS: CT HEAD FINDINGS  Hypodense lesion in the right middle cranial fossa tracking up into the sylvian fissure, fluid density, probably an arachnoid cyst or epidermoid. Size 4.0 by 3.2 by approximately 6.0 cm.  Atherosclerosis noted. The brainstem, cerebellum, cerebral peduncle sits, thalami, and basal ganglia  appear unremarkable. The ventricular system appears normal and aside from the right middle cranial fossa lesion the basilar cisterns appear normal.  Periventricular white matter and corona radiata hypodensities favor chronic ischemic microvascular white matter disease. No intracranial hemorrhage, mass lesion, or acute CVA. There is a subcutaneous lesion along the occiput/upper neck in the midline on image 1 of series 3 which could represent subcutaneous bruising. Chronic left frontal sinusitis.  CT CERVICAL SPINE FINDINGS  Considerable degenerative findings at the craniocervical junction and anterior arch of C1-2 with extensive spurring of the basion and pannus formation posterior to the odontoid. Prominent loss of articular space at the anterior C1-2 articulation.  Uncinate and facet spurring cause osseous foraminal stenosis on the right at C2-3 and on the left at C3-4, C4-5, and C6-7. There is interbody fusion at the C5-6 low-level and facet fusion on the left at C4-5 and C7-T1.  There is 2 mm degenerative anterolisthesis at C4-5 and at C7-T1. Degenerative endplate sclerosis noted with posterior osseous ridging at C6-7, C7-T1, and T1-2.  No prevertebral soft tissue swelling. No acute fracture is identified. There is dextroconvex cervical scoliosis.  Prominent emphysema and scarring noted at the lung apices.  IMPRESSION: 1. Right middle cranial fossa arachnoid cyst or epidermoid tracking into the sylvian fissure. MRI can typically differentiate between these 2 entities if clinically warranted. 2. Atherosclerosis. 3. Dense subcutaneous lesion along the  occiput but and upper neck could represent some focal bruising. 4. Chronic left frontal sinusitis. 5. Cervical spondylosis causes multilevel osseous foraminal impingement. 6. Considerable spurring at the C1- 2 articulation and along the basion, with pannus formation posterior to the odontoid-rheumatoid arthropathy not excluded. 7. Emphysema.   Electronically Signed   By: Herbie BaltimoreWalt  Liebkemann M.D.   On: 06/20/2014 13:36   Ct Chest Wo Contrast  06/20/2014   CLINICAL DATA:  Fall in bathroom. Anti coagulation. Increased confusion.  EXAM: CT CHEST, ABDOMEN AND PELVIS WITHOUT CONTRAST  TECHNIQUE: Multidetector CT imaging of the chest, abdomen and pelvis was performed following the standard protocol without IV contrast.  COMPARISON:  Multiple exams, including 05/19/2014 and 03/26/2005  FINDINGS: CT CHEST FINDINGS  Right upper paratracheal node 1.2 cm in short axis, image 12 series 2. Subcarinal node 1.4 cm in short axis, image 34 series 2. Additional paratracheal nodes are present. If hilar nodes are enlarged there difficult to measure against the background vasculature.  Aortic and branch vessel atherosclerosis. Prior CABG. Ascending thoracic aorta 4.6 cm transverse ; descending thoracic aorta 4.6 cm transverse. Lower descending thoracic aorta 5.0 cm transverse. There is mixed density surrounding tubular calcification in the ascending thoracic aorta potentially from chronic dissection, chronic mural thrombus, or even intramural hematoma. Some of this appearance could be from an ascending aortic graft-correlate with patient history. Without IV contrast did is difficult to characterize this further.  Trace left pleural effusion. Mild enlargement of the cardiopericardial silhouette  Emphysema is present with scattered scarring and peripheral fibrosis potentially with some early honeycombing at the lung bases. In addition, there is a 2.4 by 1.4 cm nodule anteriorly in the right upper lobe, image 25 of series 5 interstitial  accentuation is present bilaterally.  CT ABDOMEN AND PELVIS FINDINGS  Hepatobiliary: Unremarkable  Pancreas: Speckled calcifications throughout the pancreatic parenchyma compatible with chronic calcific pancreatitis.  Spleen: Unremarkable  Adrenals/Urinary Tract: 8 mm left kidney lower pole nonobstructive calculus. Bilateral fluid density renal lesions favoring cysts. No hydronephrosis or hydroureter. No ureteral calculi.  Stomach/Bowel: Prominent stool throughout the colon favors constipation.  Vascular/Lymphatic: Dense aortoiliac atherosclerosis.  Reproductive: Enlarged prostate gland with scattered calcifications, measuring 5.8 by 4.6 cm.  Other: No supplemental non-categorized findings.  Musculoskeletal: Bridging fusion of the sacroiliac joints. posterolateral rod and pedicle screw fixation at L4-5 with interbody and anterior facet fusion. Intervertebral and facet spurring at L3-4 and L5-S1. Small bilateral inguinal hernias contain adipose tissue.  IMPRESSION: 1. 2.4 by 1.4 cm right upper lobe pulmonary nodule with right paratracheal adenopathy, concerning for malignancy. An atypical infectious process could potentially give this appearance. Consider nuclear medicine PET-CT. 2. Severe emphysema with peripheral fibrosis, interstitial accentuation, and some basilar honeycombing. 3. Ascending and descending thoracic aortic aneurysms measuring up to 5 cm transversely. There is a mixed density in the ascending aortic aneurysm with a complex appearance, possibly from graft material and excluded thrombus, chronic mural thrombus, chronic dissection, or intramural hematoma. Correlate with operative history. If IV contrast could not be administered, MRI might help further assess the thoracic aorta. Otherwise I recommend semi-annual imaging followup by CTA or MRA and referral to cardiothoracic surgery if not already obtained. This recommendation follows 2010 ACCF/AHA/AATS/ACR/ASA/SCA/SCAI/SIR/STS/SVM Guidelines for the  Diagnosis and Management of Patients With Thoracic Aortic Disease. Circulation. 2010; 121: [id number]. 4. Trace left pleural effusion. 5. Chronic calcific pancreatitis. 6.  Prominent stool throughout the colon favors constipation. 7. Enlarged prostate gland. 8. Lumbar spondylosis particularly at L3-4 and L5-S1.   Electronically Signed   By: Herbie Baltimore M.D.   On: 06/20/2014 13:58   Ct Cervical Spine Wo Contrast  06/20/2014   CLINICAL DATA:  Fall.  Hypoxia.  EXAM: CT HEAD WITHOUT CONTRAST  CT CERVICAL SPINE WITHOUT CONTRAST  TECHNIQUE: Multidetector CT imaging of the head and cervical spine was performed following the standard protocol without intravenous contrast. Multiplanar CT image reconstructions of the cervical spine were also generated.  COMPARISON:  Report from 11/24/2000  FINDINGS: CT HEAD FINDINGS  Hypodense lesion in the right middle cranial fossa tracking up into the sylvian fissure, fluid density, probably an arachnoid cyst or epidermoid. Size 4.0 by 3.2 by approximately 6.0 cm.  Atherosclerosis noted. The brainstem, cerebellum, cerebral peduncle sits, thalami, and basal ganglia appear unremarkable. The ventricular system appears normal and aside from the right middle cranial fossa lesion the basilar cisterns appear normal.  Periventricular white matter and corona radiata hypodensities favor chronic ischemic microvascular white matter disease. No intracranial hemorrhage, mass lesion, or acute CVA. There is a subcutaneous lesion along the occiput/upper neck in the midline on image 1 of series 3 which could represent subcutaneous bruising. Chronic left frontal sinusitis.  CT CERVICAL SPINE FINDINGS  Considerable degenerative findings at the craniocervical junction and anterior arch of C1-2 with extensive spurring of the basion and pannus formation posterior to the odontoid. Prominent loss of articular space at the anterior C1-2 articulation.  Uncinate and facet spurring cause osseous foraminal  stenosis on the right at C2-3 and on the left at C3-4, C4-5, and C6-7. There is interbody fusion at the C5-6 low-level and facet fusion on the left at C4-5 and C7-T1.  There is 2 mm degenerative anterolisthesis at C4-5 and at C7-T1. Degenerative endplate sclerosis noted with posterior osseous ridging at C6-7, C7-T1, and T1-2.  No prevertebral soft tissue swelling. No acute fracture is identified. There is dextroconvex cervical scoliosis.  Prominent emphysema and scarring noted at the lung apices.  IMPRESSION: 1. Right middle cranial fossa arachnoid cyst or epidermoid tracking into the sylvian fissure. MRI can typically differentiate between these 2 entities if clinically warranted. 2.  Atherosclerosis. 3. Dense subcutaneous lesion along the occiput but and upper neck could represent some focal bruising. 4. Chronic left frontal sinusitis. 5. Cervical spondylosis causes multilevel osseous foraminal impingement. 6. Considerable spurring at the C1- 2 articulation and along the basion, with pannus formation posterior to the odontoid-rheumatoid arthropathy not excluded. 7. Emphysema.   Electronically Signed   By: Herbie Baltimore M.D.   On: 06/20/2014 13:36   Dg Chest Portable 1 View  06/20/2014   CLINICAL DATA:  Fall.  Pneumonia.  Shortness of breath.  Confusion.  EXAM: PORTABLE CHEST - 1 VIEW  COMPARISON:  05/19/2014  FINDINGS: Diffuse interstitial and patchy airspace opacities are present superimposed on emphysema. Atherosclerotic aortic arch and prior CABG. Heart size within normal limits for projection. Stable biapical pleural parenchymal scarring. No pneumothorax observed. No definite blunting of the costophrenic angles.  IMPRESSION: 1. Bilateral interstitial and airspace opacities, worsened, superimposed on severe emphysema. Appearance could reflect edema or atypical pneumonia.   Electronically Signed   By: Herbie Baltimore M.D.   On: 06/20/2014 12:09         Subjective: Patient is feeling subjectively  better than yesterday. He states he is breathing low but better. Denies any fevers, chills, headache, chest pain, shortness of breath, nausea, vomiting, diarrhea, abdominal pain.  Objective: Filed Vitals:   06/21/14 0114 06/21/14 0454 06/21/14 0807 06/21/14 0953  BP:  112/65  144/50  Pulse:  47  70  Temp:  98.1 F (36.7 C)    TempSrc:  Oral    Resp:  16    Height:      Weight:  73.2 kg (161 lb 6 oz)    SpO2: 94% 94% 98%     Intake/Output Summary (Last 24 hours) at 06/21/14 0957 Last data filed at 06/21/14 0900  Gross per 24 hour  Intake    780 ml  Output   1250 ml  Net   -470 ml   Weight change:  Exam:   General:  Pt is alert, follows commands appropriately, not in acute distress  HEENT: No icterus, No thrush,[place]/AT  Cardiovascular: RRR, S1/S2, no rubs, no gallops+JVD  Respiratory: Bilateral crackles, left greater than right. No wheezing  Abdomen: Soft/+BS, non tender, non distended, no guarding  Extremities: No edema, No lymphangitis, No petechiae, No rashes, no synovitis  Data Reviewed: Basic Metabolic Panel:  Recent Labs Lab 06/20/14 1201 06/21/14 0455  NA 145 142  K 4.5 4.1  CL 108 109  CO2 19 24  GLUCOSE 208* 136*  BUN 41* 47*  CREATININE 2.09* 2.49*  CALCIUM 9.4 8.4   Liver Function Tests:  Recent Labs Lab 06/20/14 1201  AST 26  ALT 15  ALKPHOS 85  BILITOT 1.0  PROT 8.2  ALBUMIN 3.3*   No results for input(s): LIPASE, AMYLASE in the last 168 hours. No results for input(s): AMMONIA in the last 168 hours. CBC:  Recent Labs Lab 06/20/14 1201 06/21/14 0455  WBC 11.8* 10.5  NEUTROABS 9.9*  --   HGB 10.0* 9.0*  HCT 32.7* 29.4*  MCV 94.2 94.5  PLT 211 197   Cardiac Enzymes:  Recent Labs Lab 06/20/14 1201  TROPONINI <0.30   BNP: Invalid input(s): POCBNP CBG:  Recent Labs Lab 06/20/14 1708 06/20/14 2148  GLUCAP 140* 209*    No results found for this or any previous visit (from the past 240 hour(s)).   Scheduled Meds: .  amiodarone  200 mg Oral QPM  . aspirin EC  81 mg Oral Q  breakfast  . ezetimibe  10 mg Oral Q breakfast  . ferrous sulfate  325 mg Oral Q breakfast  . gabapentin  300 mg Oral Q24H  . gabapentin  600 mg Oral BID  . heparin  5,000 Units Subcutaneous 3 times per day  . insulin aspart  0-9 Units Subcutaneous TID WC  . insulin glargine  10 Units Subcutaneous QHS  . ipratropium-albuterol  3 mL Nebulization Q6H  . metoprolol  25 mg Oral BID  . omega-3 acid ethyl esters  1 g Oral BID  . oxyCODONE-acetaminophen  1 tablet Oral Q24H  . oxyCODONE-acetaminophen  2 tablet Oral BID  . piperacillin-tazobactam (ZOSYN)  IV  3.375 g Intravenous 3 times per day  . sodium chloride  3 mL Intravenous Q12H  . vancomycin  750 mg Intravenous QHS   Continuous Infusions:    [name] Schnapp, DO  [facility] Pager [phone]  If 7PM-7AM, please contact night-coverage [email or web address] Password [facility] 06/21/2014, 9:57 AM   LOS: 1 day

## 2014-06-21 NOTE — Care Management Note (Signed)
    Page 1 of 2   06/28/2014     2:58:08 PM CARE MANAGEMENT NOTE 06/28/2014  Patient:  [name] [name],[name] [name]   Account Number:  [id number]  Date Initiated:  06/21/2014  Documentation initiated by:  [name] ClamMAHABIR,KATHY  Subjective/Objective Assessment:   80 y/o m admitted w/Acute resp failure.Hx:w/c bound.     Action/Plan:   From home w/spouse.   Anticipated DC Date:  06/24/2014   Anticipated DC Plan:  HOME W HOME HEALTH SERVICES      DC Planning Services  CM consult      [facility]   Choice offered to / List presented to:  C-3 Spouse   DME arranged  WHEELCHAIR - MANUAL  OXYGEN      DME agency  [facility]     HH arranged  HH-1 RN  HH-2 PT  HH-3 OT      [facility]   Status of service:  In process, will continue to follow Medicare Important Message given?  YES (If response is "NO", the following Medicare IM given date fields will be blank) Date Medicare IM given:  06/23/2014 Medicare IM given by:  Ferdinand CavaSCHETTINO,ANDREA Date Additional Medicare IM given:   Additional Medicare IM given by:    Discharge Disposition:  HOME W HOME HEALTH SERVICES  Per UR Regulation:  Reviewed for med. necessity/level of care/duration of stay  If discussed at Long Length of Stay Meetings, dates discussed:    Comments:  06/28/14 14:45 CM spoke with spouse, [name] [name],  of pt (who was working with PT).  CM offered choice of home health agency. [name].  Address and contact information verified with [name] [name].  [facility]  CM called [facility] DME rep, Lecretia for delivery of DME prior to discharge.  CM has requested HH orders and DME orders.  CM willl continue to monitor for home health needs.  [name] [name], BSN, Cm [phone].  06/21/14 [name] ClamKathy Mahabir RN BSN NCM [phone] Noted on NRB.May need home 02 @ d/c.

## 2014-06-21 NOTE — Progress Notes (Signed)
Echocardiogram 2D Echocardiogram has been performed.  Colletta Spillers 06/21/2014, 1:47 PM 

## 2014-06-21 NOTE — Progress Notes (Signed)
ANTIBIOTIC CONSULT NOTE - follow up  Pharmacy Consult for vancomycin Indication: pneumonia  Allergies  Allergen Reactions  . Cymbalta [Duloxetine Hcl] Other (See Comments)    Confused, paralyzed from waist up   . Statins Other (See Comments)    Leg pains    Patient Measurements: Height: 6' 0.05" (183 cm) Weight: 161 lb 6 oz (73.2 kg) IBW/kg (Calculated) : 77.71 Adjusted Body Weight:   Vital Signs: Temp: 98.1 F (36.7 C) (12/22 0454) Temp Source: Oral (12/22 0454) BP: 112/65 mmHg (12/22 0454) Pulse Rate: 47 (12/22 0454) Intake/Output from previous day: 12/21 0701 - 12/22 0700 In: 300 [IV Piggyback:300] Out: 1250 [Urine:1250] Intake/Output from this shift:    Labs:  Recent Labs  06/20/14 1201 06/21/14 0455  WBC 11.8* 10.5  HGB 10.0* 9.0*  PLT 211 197  CREATININE 2.09* 2.49*   Estimated Creatinine Clearance: 23.3 mL/min (by C-G formula based on Cr of 2.49). No results for input(s): VANCOTROUGH, VANCOPEAK, VANCORANDOM, GENTTROUGH, GENTPEAK, GENTRANDOM, TOBRATROUGH, TOBRAPEAK, TOBRARND, AMIKACINPEAK, AMIKACINTROU, AMIKACIN in the last 72 hours.   Microbiology: No results found for this or any previous visit (from the past 720 hour(s)).  Medical History: Past Medical History  Diagnosis Date  . Peripheral vascular disease     critical limb ischemia  . Myocardial infarction   . Diabetes mellitus without complication   . Hypertension   . Coronary artery disease   . Atrial fibrillation   . Aortic dissection     w/ Cardiac tamponade  . AVM (arteriovenous malformation)   . Murmur   . Hyperlipidemia   . Neuropathy of left lower extremity   . S/P CABG (coronary artery bypass graft)     Assessment: [age] YOM presents to ED with shortness of breath, found to be hypoxic.  He was admitted in November for pneumoniae and discharged on levofloxacin.  CXR reveals atypical pneumonia vs edema.  CT reveals right upper lobe pulmonary nodule with right paratracheal adenopathy,  concerning for malignancy./  UA with significant pyuria. Vancomycin per pharmacy and zosyn ordered per MD  12/21 >>vancomycin 1g q24  >> 12/21 >>zosyn (per MD)  >>    Tmax: afebrile WBCs: slightly improved Renal: SCr = 2.49, rising, with h/o CKD, normalized CrCL 23 ml/min  11/18 urine:  E. Aerogenes (R to cefazolin, I NTF) 12/21 blood: pending 12/21 UA: positive  Goal of Therapy:  Vancomycin trough level 15-20 mcg/ml  Plan:  1) Due to rising SCr, will change vancomycin from 1g q24 to 750mg  q24 2) Continue current Zosyn dosing   Hessie KnowsJustin M Yailin Biederman, PharmD, BCPS Pager [phone] 06/21/2014 8:53 AM

## 2014-06-22 DIAGNOSIS — J849 Interstitial pulmonary disease, unspecified: Secondary | ICD-10-CM

## 2014-06-22 DIAGNOSIS — J441 Chronic obstructive pulmonary disease with (acute) exacerbation: Secondary | ICD-10-CM | POA: Insufficient documentation

## 2014-06-22 DIAGNOSIS — N390 Urinary tract infection, site not specified: Secondary | ICD-10-CM | POA: Insufficient documentation

## 2014-06-22 DIAGNOSIS — J438 Other emphysema: Secondary | ICD-10-CM

## 2014-06-22 LAB — BASIC METABOLIC PANEL
ANION GAP: 7 (ref 5–15)
BUN: 59 mg/dL — ABNORMAL HIGH (ref 6–23)
CHLORIDE: 107 meq/L (ref 96–112)
CO2: 23 mmol/L (ref 19–32)
Calcium: 8 mg/dL — ABNORMAL LOW (ref 8.4–10.5)
Creatinine, Ser: 3.22 mg/dL — ABNORMAL HIGH (ref 0.50–1.35)
GFR calc Af Amer: 19 mL/min — ABNORMAL LOW (ref >90)
GFR calc non Af Amer: 16 mL/min — ABNORMAL LOW (ref >90)
Glucose, Bld: 175 mg/dL — ABNORMAL HIGH (ref 70–99)
POTASSIUM: 4.1 mmol/L (ref 3.5–5.1)
Sodium: 137 mmol/L (ref 135–145)

## 2014-06-22 LAB — CYCLIC CITRUL PEPTIDE ANTIBODY, IGG: Cyclic Citrullin Peptide Ab: <2 U/mL (ref 0.0–5.0)

## 2014-06-22 LAB — CBC
HEMATOCRIT: 28.2 % — AB (ref 39.0–52.0)
HEMOGLOBIN: 8.4 g/dL — AB (ref 13.0–17.0)
MCH: 28.7 pg (ref 26.0–34.0)
MCHC: 29.8 g/dL — ABNORMAL LOW (ref 30.0–36.0)
MCV: 96.2 fL (ref 78.0–100.0)
Platelets: 210 10*3/uL (ref 150–400)
RBC: 2.93 MIL/uL — AB (ref 4.22–5.81)
RDW: 17.7 % — ABNORMAL HIGH (ref 11.5–15.5)
WBC: 9.1 10*3/uL (ref 4.0–10.5)

## 2014-06-22 LAB — LEGIONELLA ANTIGEN, URINE

## 2014-06-22 LAB — GLUCOSE, CAPILLARY
GLUCOSE-CAPILLARY: 164 mg/dL — AB (ref 70–99)
GLUCOSE-CAPILLARY: 183 mg/dL — AB (ref 70–99)
GLUCOSE-CAPILLARY: 194 mg/dL — AB (ref 70–99)
Glucose-Capillary: 211 mg/dL — ABNORMAL HIGH (ref 70–99)

## 2014-06-22 LAB — ANA: ANA: POSITIVE — AB

## 2014-06-22 LAB — ANTI-NUCLEAR AB-TITER (ANA TITER): ANA Titer 1: NEGATIVE

## 2014-06-22 LAB — INFLUENZA PANEL BY PCR (TYPE A & B)
H1N1FLUPCR: NOT DETECTED
Influenza A By PCR: NEGATIVE
Influenza B By PCR: NEGATIVE

## 2014-06-22 LAB — PROCALCITONIN: PROCALCITONIN: 1.33 ng/mL

## 2014-06-22 LAB — SJOGRENS SYNDROME-A EXTRACTABLE NUCLEAR ANTIBODY: SSA (Ro) (ENA) Antibody, IgG: <1

## 2014-06-22 LAB — JO-1 ANTIBODY-IGG: Jo-1 Antibody, IgG: <1

## 2014-06-22 LAB — ANTI-SCLERODERMA ANTIBODY: Scleroderma (Scl-70) (ENA) Antibody, IgG: <1

## 2014-06-22 LAB — SJOGRENS SYNDROME-B EXTRACTABLE NUCLEAR ANTIBODY: SSB (LA) (ENA) ANTIBODY, IGG: NEGATIVE

## 2014-06-22 MED ORDER — METHYLPREDNISOLONE SODIUM SUCC 125 MG IJ SOLR
125.0000 mg | Freq: Four times a day (QID) | INTRAMUSCULAR | Status: DC
  Filled 2014-06-22 (×2): qty 2

## 2014-06-22 MED ORDER — PIPERACILLIN-TAZOBACTAM IN DEX 2-0.25 GM/50ML IV SOLN
2.2500 g | Freq: Four times a day (QID) | INTRAVENOUS | Status: DC
  Administered 2014-06-22 – 2014-06-23 (×4): 2.25 g via INTRAVENOUS
  Filled 2014-06-22 (×6): qty 50

## 2014-06-22 MED ORDER — METHYLPREDNISOLONE SODIUM SUCC 125 MG IJ SOLR
125.0000 mg | Freq: Two times a day (BID) | INTRAMUSCULAR | Status: AC
  Administered 2014-06-23 – 2014-06-24 (×3): 125 mg via INTRAVENOUS
  Filled 2014-06-22 (×3): qty 2

## 2014-06-22 MED ORDER — VANCOMYCIN HCL IN DEXTROSE 1-5 GM/200ML-% IV SOLN: 1000.0000 mg | INTRAVENOUS | Status: DC

## 2014-06-22 MED ORDER — HALOPERIDOL LACTATE 5 MG/ML IJ SOLN
2.5000 mg | Freq: Once | INTRAMUSCULAR | Status: AC
  Administered 2014-06-23: 2.5 mg via INTRAVENOUS
  Filled 2014-06-22: qty 1

## 2014-06-22 NOTE — Progress Notes (Signed)
PROGRESS NOTE  [name] [name]:[id number] DOB: 1929-06-16 DOA: 06/20/2014 PCP: [name], MD  Brief history 80 year old male with a history of COPD, paroxysmal A. fib, CKD stage III, diabetes mellitus, hypertension, thoracic aortic aneurysm repair presents with 3 day history of worsening shortness of breath. Notably, the patient was recently admitted to the hospital from November 18 through 05/21/2014 for pneumonia. The patient was discharged home with levofloxacin. The patient presented to his primary care physician's office on the day of admission when he was noted to have oxygen saturation of 80%. He was sent to the emergency department. In the emergency department, the patient was noted to have oxygen saturation of 63% on room air. CT of the chest showed "mixed density surrounding tubular calcification in the ascending thoracic aorta potentially from chronic dissection, chronic muralthrombus, or even intramural hematoma. Some of this appearance could be from an ascending aortic graft". CT of the abdomen and pelvis was negative for any acute findings, but it does show prominent stool.   Assessment/Plan: Acute respiratory failure -Likely multifactorial with the possibility of pulmonary edema, pneumonia (HCAP), and consideration for underlying ILD or BOOP -The patient is stable on a NRB without distress presently -Treat wit intravenous antibiotics and steroids as recommended by PCCM -The patient did not have any significant clinical improvement with intravenous furosemide x 4 doses; and given increase in Cr will hold on further lasix -will follow rec's per Dr. Kendrick Fries  Pulmonary opacities/infiltrates -No significant clinical improvement with intravenous furosemide 4 doses -will follow pulmonary rec's -Intravenous vancomycin and Zosyn -Blood cultures 2 sets--Unfortunately, this was not initially ordered, and Zosyn was given prior to collection of the blood  cultures -check procalcitonin-->0.67 -Continue bronchodilators -lasix now on hold due to acute component of renal failure  Elevated proBNP  -Although the patient has CKD stage III which may elevate BNP, this is more elevated than his prior admission  -Although the patient has some JVD on examination, he does not appear to be clinically fluid overloaded  -11/02/13 Echo did not show any RV dysfunction, TR, or pulm HTN  -repeated echo pending -I/O's, daily weights -low sodium diet  Pyuria -TNTC WBC in urine  -continue zosyn -unfortunately, urine culture was not set from ED  Acute on CKD stage III  -Baseline creatinine 1.7-2.1  -due to diuresis and presumed UTI -will hold lasix today -Cr up to 3.22  Altered mental status -likely due to pt infection/hypoxemia -resolved now -The patient is A&Ox3  -CT brain neg for acute findings  COPD  -Cannot rule out possibility of underlying BOOP/ILD -continue bronchodilators -no wheezing presently -per PCCM will start steroids for potential ILD  Paroxysmal Atrial fibrillation  -Rate controlled  -Presently in sinus rhythm  -Discontinue apixiban given the patient's recent history of multiple falls  -continue aspirin  -discontinue amiodarone, due to current lung issues   Diabetes mellitus type 2  -05/18/2014 hemoglobin A1c 6.5  -Give half home dose of Lantus  -NovoLog sliding scale   Hypertension  -Continue metoprolol tartrate  -Hold amlodipine -Hold Cardura -will also hold lasix given renal failure  coronary artery disease  -Patient had CABG in 2006 -Currently chest pain-free -Continue metoprolol tartrate   Thoracic Aortic Aneursym -s/p repair -changes on CT likely due to previous repair -continue outpatient follow up  Constipation -colace and bisacodyl -may need enema if no improvement  Family Communication:   No family at bedside Disposition Plan:   Home when medically  stable   Antibiotics: Vancomycin 06/20/14>>> Zosyn 06/20/14>>> zithromax 12/22>>   Procedures/Studies: Ct Abdomen Pelvis Wo Contrast  06/20/2014   CLINICAL DATA:  Fall in bathroom. Anti coagulation. Increased confusion.  EXAM: CT CHEST, ABDOMEN AND PELVIS WITHOUT CONTRAST  TECHNIQUE: Multidetector CT imaging of the chest, abdomen and pelvis was performed following the standard protocol without IV contrast.  COMPARISON:  Multiple exams, including 05/19/2014 and 03/26/2005  FINDINGS: CT CHEST FINDINGS  Right upper paratracheal node 1.2 cm in short axis, image 12 series 2. Subcarinal node 1.4 cm in short axis, image 34 series 2. Additional paratracheal nodes are present. If hilar nodes are enlarged there difficult to measure against the background vasculature.  Aortic and branch vessel atherosclerosis. Prior CABG. Ascending thoracic aorta 4.6 cm transverse ; descending thoracic aorta 4.6 cm transverse. Lower descending thoracic aorta 5.0 cm transverse. There is mixed density surrounding tubular calcification in the ascending thoracic aorta potentially from chronic dissection, chronic mural thrombus, or even intramural hematoma. Some of this appearance could be from an ascending aortic graft-correlate with patient history. Without IV contrast did is difficult to characterize this further.  Trace left pleural effusion. Mild enlargement of the cardiopericardial silhouette  Emphysema is present with scattered scarring and peripheral fibrosis potentially with some early honeycombing at the lung bases. In addition, there is a 2.4 by 1.4 cm nodule anteriorly in the right upper lobe, image 25 of series 5 interstitial accentuation is present bilaterally.  CT ABDOMEN AND PELVIS FINDINGS  Hepatobiliary: Unremarkable  Pancreas: Speckled calcifications throughout the pancreatic parenchyma compatible with chronic calcific pancreatitis.  Spleen: Unremarkable  Adrenals/Urinary Tract: 8 mm left kidney lower pole  nonobstructive calculus. Bilateral fluid density renal lesions favoring cysts. No hydronephrosis or hydroureter. No ureteral calculi.  Stomach/Bowel: Prominent stool throughout the colon favors constipation.  Vascular/Lymphatic: Dense aortoiliac atherosclerosis.  Reproductive: Enlarged prostate gland with scattered calcifications, measuring 5.8 by 4.6 cm.  Other: No supplemental non-categorized findings.  Musculoskeletal: Bridging fusion of the sacroiliac joints. posterolateral rod and pedicle screw fixation at L4-5 with interbody and anterior facet fusion. Intervertebral and facet spurring at L3-4 and L5-S1. Small bilateral inguinal hernias contain adipose tissue.  IMPRESSION: 1. 2.4 by 1.4 cm right upper lobe pulmonary nodule with right paratracheal adenopathy, concerning for malignancy. An atypical infectious process could potentially give this appearance. Consider nuclear medicine PET-CT. 2. Severe emphysema with peripheral fibrosis, interstitial accentuation, and some basilar honeycombing. 3. Ascending and descending thoracic aortic aneurysms measuring up to 5 cm transversely. There is a mixed density in the ascending aortic aneurysm with a complex appearance, possibly from graft material and excluded thrombus, chronic mural thrombus, chronic dissection, or intramural hematoma. Correlate with operative history. If IV contrast could not be administered, MRI might help further assess the thoracic aorta. Otherwise I recommend semi-annual imaging followup by CTA or MRA and referral to cardiothoracic surgery if not already obtained. This recommendation follows 2010 ACCF/AHA/AATS/ACR/ASA/SCA/SCAI/SIR/STS/SVM Guidelines for the Diagnosis and Management of Patients With Thoracic Aortic Disease. Circulation. 2010; 121: Z610-R604. 4. Trace left pleural effusion. 5. Chronic calcific pancreatitis. 6.  Prominent stool throughout the colon favors constipation. 7. Enlarged prostate gland. 8. Lumbar spondylosis particularly at  L3-4 and L5-S1.   Electronically Signed   By: Herbie Baltimore M.D.   On: 06/20/2014 13:58   Ct Head Wo Contrast  06/20/2014   CLINICAL DATA:  Fall.  Hypoxia.  EXAM: CT HEAD WITHOUT CONTRAST  CT CERVICAL SPINE WITHOUT CONTRAST  TECHNIQUE: Multidetector CT imaging of the head and cervical  spine was performed following the standard protocol without intravenous contrast. Multiplanar CT image reconstructions of the cervical spine were also generated.  COMPARISON:  Report from 11/24/2000  FINDINGS: CT HEAD FINDINGS  Hypodense lesion in the right middle cranial fossa tracking up into the sylvian fissure, fluid density, probably an arachnoid cyst or epidermoid. Size 4.0 by 3.2 by approximately 6.0 cm.  Atherosclerosis noted. The brainstem, cerebellum, cerebral peduncle sits, thalami, and basal ganglia appear unremarkable. The ventricular system appears normal and aside from the right middle cranial fossa lesion the basilar cisterns appear normal.  Periventricular white matter and corona radiata hypodensities favor chronic ischemic microvascular white matter disease. No intracranial hemorrhage, mass lesion, or acute CVA. There is a subcutaneous lesion along the occiput/upper neck in the midline on image 1 of series 3 which could represent subcutaneous bruising. Chronic left frontal sinusitis.  CT CERVICAL SPINE FINDINGS  Considerable degenerative findings at the craniocervical junction and anterior arch of C1-2 with extensive spurring of the basion and pannus formation posterior to the odontoid. Prominent loss of articular space at the anterior C1-2 articulation.  Uncinate and facet spurring cause osseous foraminal stenosis on the right at C2-3 and on the left at C3-4, C4-5, and C6-7. There is interbody fusion at the C5-6 low-level and facet fusion on the left at C4-5 and C7-T1.  There is 2 mm degenerative anterolisthesis at C4-5 and at C7-T1. Degenerative endplate sclerosis noted with posterior osseous ridging at C6-7,  C7-T1, and T1-2.  No prevertebral soft tissue swelling. No acute fracture is identified. There is dextroconvex cervical scoliosis.  Prominent emphysema and scarring noted at the lung apices.  IMPRESSION: 1. Right middle cranial fossa arachnoid cyst or epidermoid tracking into the sylvian fissure. MRI can typically differentiate between these 2 entities if clinically warranted. 2. Atherosclerosis. 3. Dense subcutaneous lesion along the occiput but and upper neck could represent some focal bruising. 4. Chronic left frontal sinusitis. 5. Cervical spondylosis causes multilevel osseous foraminal impingement. 6. Considerable spurring at the C1- 2 articulation and along the basion, with pannus formation posterior to the odontoid-rheumatoid arthropathy not excluded. 7. Emphysema.   Electronically Signed   By: Herbie BaltimoreWalt  Liebkemann M.D.   On: 06/20/2014 13:36   Ct Chest Wo Contrast  06/20/2014   CLINICAL DATA:  Fall in bathroom. Anti coagulation. Increased confusion.  EXAM: CT CHEST, ABDOMEN AND PELVIS WITHOUT CONTRAST  TECHNIQUE: Multidetector CT imaging of the chest, abdomen and pelvis was performed following the standard protocol without IV contrast.  COMPARISON:  Multiple exams, including 05/19/2014 and 03/26/2005  FINDINGS: CT CHEST FINDINGS  Right upper paratracheal node 1.2 cm in short axis, image 12 series 2. Subcarinal node 1.4 cm in short axis, image 34 series 2. Additional paratracheal nodes are present. If hilar nodes are enlarged there difficult to measure against the background vasculature.  Aortic and branch vessel atherosclerosis. Prior CABG. Ascending thoracic aorta 4.6 cm transverse ; descending thoracic aorta 4.6 cm transverse. Lower descending thoracic aorta 5.0 cm transverse. There is mixed density surrounding tubular calcification in the ascending thoracic aorta potentially from chronic dissection, chronic mural thrombus, or even intramural hematoma. Some of this appearance could be from an ascending  aortic graft-correlate with patient history. Without IV contrast did is difficult to characterize this further.  Trace left pleural effusion. Mild enlargement of the cardiopericardial silhouette  Emphysema is present with scattered scarring and peripheral fibrosis potentially with some early honeycombing at the lung bases. In addition, there is a 2.4 by 1.4 cm  nodule anteriorly in the right upper lobe, image 25 of series 5 interstitial accentuation is present bilaterally.  CT ABDOMEN AND PELVIS FINDINGS  Hepatobiliary: Unremarkable  Pancreas: Speckled calcifications throughout the pancreatic parenchyma compatible with chronic calcific pancreatitis.  Spleen: Unremarkable  Adrenals/Urinary Tract: 8 mm left kidney lower pole nonobstructive calculus. Bilateral fluid density renal lesions favoring cysts. No hydronephrosis or hydroureter. No ureteral calculi.  Stomach/Bowel: Prominent stool throughout the colon favors constipation.  Vascular/Lymphatic: Dense aortoiliac atherosclerosis.  Reproductive: Enlarged prostate gland with scattered calcifications, measuring 5.8 by 4.6 cm.  Other: No supplemental non-categorized findings.  Musculoskeletal: Bridging fusion of the sacroiliac joints. posterolateral rod and pedicle screw fixation at L4-5 with interbody and anterior facet fusion. Intervertebral and facet spurring at L3-4 and L5-S1. Small bilateral inguinal hernias contain adipose tissue.  IMPRESSION: 1. 2.4 by 1.4 cm right upper lobe pulmonary nodule with right paratracheal adenopathy, concerning for malignancy. An atypical infectious process could potentially give this appearance. Consider nuclear medicine PET-CT. 2. Severe emphysema with peripheral fibrosis, interstitial accentuation, and some basilar honeycombing. 3. Ascending and descending thoracic aortic aneurysms measuring up to 5 cm transversely. There is a mixed density in the ascending aortic aneurysm with a complex appearance, possibly from graft material and  excluded thrombus, chronic mural thrombus, chronic dissection, or intramural hematoma. Correlate with operative history. If IV contrast could not be administered, MRI might help further assess the thoracic aorta. Otherwise I recommend semi-annual imaging followup by CTA or MRA and referral to cardiothoracic surgery if not already obtained. This recommendation follows 2010 ACCF/AHA/AATS/ACR/ASA/SCA/SCAI/SIR/STS/SVM Guidelines for the Diagnosis and Management of Patients With Thoracic Aortic Disease. Circulation. 2010; 121: [id number]. 4. Trace left pleural effusion. 5. Chronic calcific pancreatitis. 6.  Prominent stool throughout the colon favors constipation. 7. Enlarged prostate gland. 8. Lumbar spondylosis particularly at L3-4 and L5-S1.   Electronically Signed   By: Herbie BaltimoreWalt  Liebkemann M.D.   On: 06/20/2014 13:58   Ct Cervical Spine Wo Contrast  06/20/2014   CLINICAL DATA:  Fall.  Hypoxia.  EXAM: CT HEAD WITHOUT CONTRAST  CT CERVICAL SPINE WITHOUT CONTRAST  TECHNIQUE: Multidetector CT imaging of the head and cervical spine was performed following the standard protocol without intravenous contrast. Multiplanar CT image reconstructions of the cervical spine were also generated.  COMPARISON:  Report from 11/24/2000  FINDINGS: CT HEAD FINDINGS  Hypodense lesion in the right middle cranial fossa tracking up into the sylvian fissure, fluid density, probably an arachnoid cyst or epidermoid. Size 4.0 by 3.2 by approximately 6.0 cm.  Atherosclerosis noted. The brainstem, cerebellum, cerebral peduncle sits, thalami, and basal ganglia appear unremarkable. The ventricular system appears normal and aside from the right middle cranial fossa lesion the basilar cisterns appear normal.  Periventricular white matter and corona radiata hypodensities favor chronic ischemic microvascular white matter disease. No intracranial hemorrhage, mass lesion, or acute CVA. There is a subcutaneous lesion along the occiput/upper neck in the  midline on image 1 of series 3 which could represent subcutaneous bruising. Chronic left frontal sinusitis.  CT CERVICAL SPINE FINDINGS  Considerable degenerative findings at the craniocervical junction and anterior arch of C1-2 with extensive spurring of the basion and pannus formation posterior to the odontoid. Prominent loss of articular space at the anterior C1-2 articulation.  Uncinate and facet spurring cause osseous foraminal stenosis on the right at C2-3 and on the left at C3-4, C4-5, and C6-7. There is interbody fusion at the C5-6 low-level and facet fusion on the left at C4-5 and C7-T1.  There  is 2 mm degenerative anterolisthesis at C4-5 and at C7-T1. Degenerative endplate sclerosis noted with posterior osseous ridging at C6-7, C7-T1, and T1-2.  No prevertebral soft tissue swelling. No acute fracture is identified. There is dextroconvex cervical scoliosis.  Prominent emphysema and scarring noted at the lung apices.  IMPRESSION: 1. Right middle cranial fossa arachnoid cyst or epidermoid tracking into the sylvian fissure. MRI can typically differentiate between these 2 entities if clinically warranted. 2. Atherosclerosis. 3. Dense subcutaneous lesion along the occiput but and upper neck could represent some focal bruising. 4. Chronic left frontal sinusitis. 5. Cervical spondylosis causes multilevel osseous foraminal impingement. 6. Considerable spurring at the C1- 2 articulation and along the basion, with pannus formation posterior to the odontoid-rheumatoid arthropathy not excluded. 7. Emphysema.   Electronically Signed   By: Herbie Baltimore M.D.   On: 06/20/2014 13:36   Dg Chest Portable 1 View  06/20/2014   CLINICAL DATA:  Fall.  Pneumonia.  Shortness of breath.  Confusion.  EXAM: PORTABLE CHEST - 1 VIEW  COMPARISON:  05/19/2014  FINDINGS: Diffuse interstitial and patchy airspace opacities are present superimposed on emphysema. Atherosclerotic aortic arch and prior CABG. Heart size within normal  limits for projection. Stable biapical pleural parenchymal scarring. No pneumothorax observed. No definite blunting of the costophrenic angles.  IMPRESSION: 1. Bilateral interstitial and airspace opacities, worsened, superimposed on severe emphysema. Appearance could reflect edema or atypical pneumonia.   Electronically Signed   By: Herbie Baltimore M.D.   On: 06/20/2014 12:09    Subjective: Patient is feeling subjectively better; but continue with high oxygen demand and now Cr is elevated at 3.22  Objective: Filed Vitals:   06/22/14 0129 06/22/14 0624 06/22/14 0824 06/22/14 1330  BP:  113/67  111/52  Pulse:  54  54  Temp:  98 F (36.7 C)  97.8 F (36.6 C)  TempSrc:  Axillary  Axillary  Resp:  16  16  Height:      Weight:  73.4 kg (161 lb 13.1 oz)    SpO2: 93% 100% 97% 99%    Intake/Output Summary (Last 24 hours) at 06/22/14 1827 Last data filed at 06/22/14 1514  Gross per 24 hour  Intake   1380 ml  Output    900 ml  Net    480 ml   Weight change: 0.9 kg (1 lb 15.7 oz) Exam:   General:  Pt is alert, follows commands appropriately, still with difficulty breathing and requiring high level of oxygen  HEENT: No icterus, No thrush, [place]/AT  Cardiovascular: RRR, S1/S2, no rubs, no gallops, +JVD  Respiratory: Bilateral crackles, left greater than right. No wheezing  Abdomen: Soft/+BS, non tender, non distended, no guarding  Extremities: No edema, No lymphangitis, No petechiae, No rashes, no synovitis  Data Reviewed: Basic Metabolic Panel:  Recent Labs Lab 06/20/14 1201 06/21/14 0455 06/22/14 0515  NA 145 142 137  K 4.5 4.1 4.1  CL 108 109 107  CO2 19 24 23   GLUCOSE 208* 136* 175*  BUN 41* 47* 59*  CREATININE 2.09* 2.49* 3.22*  CALCIUM 9.4 8.4 8.0*   Liver Function Tests:  Recent Labs Lab 06/20/14 1201  AST 26  ALT 15  ALKPHOS 85  BILITOT 1.0  PROT 8.2  ALBUMIN 3.3*   CBC:  Recent Labs Lab 06/20/14 1201 06/21/14 0455 06/22/14 0515  WBC 11.8* 10.5  9.1  NEUTROABS 9.9*  --   --   HGB 10.0* 9.0* 8.4*  HCT 32.7* 29.4* 28.2*  MCV 94.2  94.5 96.2  PLT 211 197 210   Cardiac Enzymes:  Recent Labs Lab 06/20/14 1201  TROPONINI <0.30   BNP: Invalid input(s): POCBNP CBG:  Recent Labs Lab 06/21/14 1708 06/21/14 2128 06/22/14 0753 06/22/14 1154 06/22/14 1743  GLUCAP 134* 216* 164* 183* 194*    Recent Results (from the past 240 hour(s))  Culture, blood (routine x 2)     Status: None (Preliminary result)   Collection Time: 06/20/14  5:42 PM  Result Value Ref Range Status   Specimen Description BLOOD RIGHT ARM  Final   Special Requests BOTTLES DRAWN AEROBIC AND ANAEROBIC 10CC  Final   Culture  Setup Time   Final    06/21/2014 02:53 Performed at [facility]    Culture   Final           BLOOD CULTURE RECEIVED NO GROWTH TO DATE CULTURE WILL BE HELD FOR 5 DAYS BEFORE ISSUING A FINAL NEGATIVE REPORT Performed at [facility]    Report Status PENDING  Incomplete  Culture, blood (routine x 2)     Status: None (Preliminary result)   Collection Time: 06/20/14  5:50 PM  Result Value Ref Range Status   Specimen Description BLOOD LEFT HAND  Final   Special Requests BOTTLES DRAWN AEROBIC ONLY 5CC  Final   Culture  Setup Time   Final    06/21/2014 02:56 Performed at [facility]    Culture   Final           BLOOD CULTURE RECEIVED NO GROWTH TO DATE CULTURE WILL BE HELD FOR 5 DAYS BEFORE ISSUING A FINAL NEGATIVE REPORT Performed at [facility]    Report Status PENDING  Incomplete     Scheduled Meds: . aspirin EC  81 mg Oral Q breakfast  . azithromycin  500 mg Intravenous Q24H  . bisacodyl  10 mg Rectal Once  . docusate sodium  100 mg Oral BID  . ezetimibe  10 mg Oral Q breakfast  . ferrous sulfate  325 mg Oral Q breakfast  . gabapentin  300 mg Oral Q24H  . gabapentin  600 mg Oral BID  . heparin  5,000 Units Subcutaneous 3 times per day  . insulin aspart  0-9 Units Subcutaneous TID WC  .  insulin glargine  10 Units Subcutaneous QHS  . ipratropium-albuterol  3 mL Nebulization Q6H  . [START ON 06/23/2014] methylPREDNISolone (SOLU-MEDROL) injection  125 mg Intravenous Q12H  . metoprolol  25 mg Oral BID  . omega-3 acid ethyl esters  1 g Oral BID  . oxyCODONE-acetaminophen  1 tablet Oral Q24H  . oxyCODONE-acetaminophen  2 tablet Oral BID  . piperacillin-tazobactam (ZOSYN)  IV  2.25 g Intravenous Q6H  . sodium chloride  3 mL Intravenous Q12H  . [START ON 06/23/2014] vancomycin  1,000 mg Intravenous Q48H   Continuous Infusions:    Marcha Dutton  [facility] Pager [phone]  If 7PM-7AM, please contact night-coverage [email or web address] Password [facility] 06/22/2014, 6:27 PM   LOS: 2 days

## 2014-06-22 NOTE — Progress Notes (Signed)
PULMONARY / CRITICAL CARE MEDICINE   Name: [name] [name] MRN: [id number] DOB: 07/23/1928    ADMISSION DATE:  06/20/2014 CONSULTATION DATE:  06/21/2014  REFERRING MD :   Tat  CHIEF COMPLAINT: Shortness of breath  INITIAL PRESENTATION: 80 year old male with no prior pulmonary history was admitted on 06/20/2014 with acute hypoxemic respiratory failure after an upper respiratory infection syndrome.  STUDIES:  12/21 CT chest >> there is notable emphysema in the lungs bilaterally with honeycombing in the bases and interstitial thickening, overall picture worrisome for interstitial lung disease superimposed on a background of emphysema. See radiology report for other findings including aorta, status post grafting. 12/22  ECHO >> EF 50-55%, mod LVH, PA Peak 41, dilated descending thoracic aorta 4.4 cm   SIGNIFICANT EVENTS: 12/22  PCCM consulted for hypoxemic respiratory failure 12/23  Remains on 15L O2   SUBJECTIVE: RN reports pt desaturates into 60's with breaks in O2.  Wife relays story of chronic illness since they lived in [place].  Feels there was government testing at a [facility] hospital that they lived near with potential contamination / infections.    VITAL SIGNS: Temp:  [97.4 F (36.3 C)-98 F (36.7 C)] 98 F (36.7 C) (12/23 0624) Pulse Rate:  [50-55] 54 (12/23 0624) Resp:  [16] 16 (12/23 0624) BP: (108-113)/(36-67) 113/67 mmHg (12/23 0624) SpO2:  [93 %-100 %] 97 % (12/23 0824) FiO2 (%):  [100 %] 100 % (12/23 0824) Weight:  [161 lb 13.1 oz (73.4 kg)] 161 lb 13.1 oz (73.4 kg) (12/23 0624)  VENTILATOR SETTINGS: Vent Mode:  [-]  FiO2 (%):  [100 %] 100 %   INTAKE / OUTPUT:  Intake/Output Summary (Last 24 hours) at 06/22/14 1107 Last data filed at 06/22/14 0900  Gross per 24 hour  Intake   1500 ml  Output    850 ml  Net    650 ml    PHYSICAL EXAMINATION: General:  Comfortable in bed Neuro:  Awake and alert, seems easily distracted but provides reasonable history,  chronic left foot weakness noted HEENT:  NCAT, EOMi Cardiovascular:  irreg irreg, no mgr Lungs:  Crackles bilaterally, no wheezing Abdomen:  BS+, soft, nontender Musculoskeletal:  Decreased tone and bulk left leg Skin:  No rash or skin breakdown  LABS:  CBC  Recent Labs Lab 06/20/14 1201 06/21/14 0455 06/22/14 0515  WBC 11.8* 10.5 9.1  HGB 10.0* 9.0* 8.4*  HCT 32.7* 29.4* 28.2*  PLT 211 197 210   Coag's  Recent Labs Lab 06/20/14 1201  INR 1.41   BMET  Recent Labs Lab 06/20/14 1201 06/21/14 0455 06/22/14 0515  NA 145 142 137  K 4.5 4.1 4.1  CL 108 109 107  CO2 $Re'19 24 23  'CEG$ BUN 41* 47* 59*  CREATININE 2.09* 2.49* 3.22*  GLUCOSE 208* 136* 175*   Electrolytes  Recent Labs Lab 06/20/14 1201 06/21/14 0455 06/22/14 0515  CALCIUM 9.4 8.4 8.0*   Sepsis Markers  Recent Labs Lab 06/20/14 1207 06/20/14 1742 06/22/14 0515  LATICACIDVEN 2.26*  --   --   PROCALCITON  --  0.67 1.33   ABG  Recent Labs Lab 06/20/14 1301  PHART 7.409  PCO2ART 34.0*  PO2ART 84.6   Liver Enzymes  Recent Labs Lab 06/20/14 1201  AST 26  ALT 15  ALKPHOS 85  BILITOT 1.0  ALBUMIN 3.3*   Cardiac Enzymes  Recent Labs Lab 06/20/14 1201 06/20/14 1202  TROPONINI <0.30  --   PROBNP  --  14250.0*   Glucose  Recent Labs Lab 06/20/14 2148 06/21/14 0753 06/21/14 1158 06/21/14 1708 06/21/14 2128 06/22/14 0753  GLUCAP 209* 104* 297* 134* 216* 164*    Imaging    ASSESSMENT / PLAN:  PULMONARY A:  Acute hypoxemic respiratory failure: Given the acute nature of this infectious etiology versus pulmonary edema seems most likely. However, the CT chest is not classic for pulmonary edema and is worrisome for severe emphysema with an underlying interstitial process. If this process is chronic, then it would be most likely idiopathic pulmonary fibrosis considering his age group. However, this has progressed very rapidly so I favor infectious versus pulmonary edema at this  time. Notably, he does take amiodarone which classically causes an organizing pneumonia picture, the CT findings are not consistent with that right now. It should be noted that amiodarone can cause a number of pulmonary toxicities and so it's worth stopping that if possible considering his high O2 requirements. PAH - pa peak 41 on ECHO P:   -Check connective tissue disease panel >> CRP 17, ESR 75, ANA positive, RF 18 -follow up CCP, Sjogrens, Anti-scleroderma antibody, Jo-1 IgG -continue O2 as needed for saturations > 88% -diurese 12/22 40 mg lasix, hold further  -Solu-medrol 125 mg IV q6, with likely transition to oral over the weekend -stop amiodarone -treat as pneumonia, see below -will need O2 eval prior to discharge  CARDIOVASCULAR A:  Acute diastolic heart failure? Has elevated JVD, proBNP Afib  P:  -hold further diuresis with rise of serum cr  -may need cardiology to see him after stopping amiodarone (follows with Dr. Claiborne Billings), would recommend rate control for now -ECHO reviewed, see above  INFECTIOUS U. Legionella 12/22 >>  RVF 12/22 >>  BCx2 12/21 >>  A:  HCAP? Consider atypical pneumonia P: -continue Abx per primary service -continue azithro -follow cultures as above   Noe Gens, NP-C [facility] Pgr: [phone] or [phone]   06/22/2014, 11:07 AM   Attending: I have seen and examined the patient with nurse practitioner/resident and agree with the note above.   On exam his lungs still have crackles, still has JVD Cr elevated, vitals reviewed    I explained to his wife today that he appears to have chronic lung changes> emphysema and fibrosis; I hope the fibrotic change is more acute inflammation as this would respond to steroids.  Not sure if amiodarone is at play or not.  Too sick for biopsy/bronch  Plan: Hold diuresis given Cr Solumedrol written today If urine legionella antigen negative and afebrile could stop Abx by  12/24.  Wife updated by me at bedside  Roselie Awkward, MD [facility] Pager: [phone] Cell: [phone] If no response, call [phone]

## 2014-06-22 NOTE — Progress Notes (Signed)
ANTIBIOTIC CONSULT NOTE - follow up  Pharmacy Consult for vancomycin Indication: pneumonia  Allergies  Allergen Reactions  . Cymbalta [Duloxetine Hcl] Other (See Comments)    Confused, paralyzed from waist up   . Statins Other (See Comments)    Leg pains    Patient Measurements: Height: 6' 0.05" (183 cm) Weight: 161 lb 13.1 oz (73.4 kg) IBW/kg (Calculated) : 77.71 Adjusted Body Weight:   Vital Signs: Temp: 98 F (36.7 C) (12/23 0624) Temp Source: Axillary (12/23 0624) BP: 113/67 mmHg (12/23 0624) Pulse Rate: 54 (12/23 0624) Intake/Output from previous day: 12/22 0701 - 12/23 0700 In: 1620 [P.O.:1320; IV Piggyback:300] Out: 850 [Urine:850] Intake/Output from this shift:    Labs:  Recent Labs  06/20/14 1201 06/21/14 0455 06/22/14 0515  WBC 11.8* 10.5 9.1  HGB 10.0* 9.0* 8.4*  PLT 211 197 210  CREATININE 2.09* 2.49* 3.22*   Estimated Creatinine Clearance: 18 mL/min (by C-G formula based on Cr of 3.22). No results for input(s): VANCOTROUGH, VANCOPEAK, VANCORANDOM, GENTTROUGH, GENTPEAK, GENTRANDOM, TOBRATROUGH, TOBRAPEAK, TOBRARND, AMIKACINPEAK, AMIKACINTROU, AMIKACIN in the last 72 hours.   Microbiology: Recent Results (from the past 720 hour(s))  Culture, blood (routine x 2)     Status: None (Preliminary result)   Collection Time: 06/20/14  5:42 PM  Result Value Ref Range Status   Specimen Description BLOOD RIGHT ARM  Final   Special Requests BOTTLES DRAWN AEROBIC AND ANAEROBIC 10CC  Final   Culture  Setup Time   Final    06/21/2014 02:53 Performed at [facility]    Culture   Final           BLOOD CULTURE RECEIVED NO GROWTH TO DATE CULTURE WILL BE HELD FOR 5 DAYS BEFORE ISSUING A FINAL NEGATIVE REPORT Performed at [facility]    Report Status PENDING  Incomplete  Culture, blood (routine x 2)     Status: None (Preliminary result)   Collection Time: 06/20/14  5:50 PM  Result Value Ref Range Status   Specimen Description BLOOD LEFT HAND   Final   Special Requests BOTTLES DRAWN AEROBIC ONLY 5CC  Final   Culture  Setup Time   Final    06/21/2014 02:56 Performed at [facility]    Culture   Final           BLOOD CULTURE RECEIVED NO GROWTH TO DATE CULTURE WILL BE HELD FOR 5 DAYS BEFORE ISSUING A FINAL NEGATIVE REPORT Performed at [facility]    Report Status PENDING  Incomplete    Medical History: Past Medical History  Diagnosis Date  . Peripheral vascular disease     critical limb ischemia  . Myocardial infarction   . Diabetes mellitus without complication   . Hypertension   . Coronary artery disease   . Atrial fibrillation   . Aortic dissection     w/ Cardiac tamponade  . AVM (arteriovenous malformation)   . Murmur   . Hyperlipidemia   . Neuropathy of left lower extremity   . S/P CABG (coronary artery bypass graft)     Assessment: [age] YOM presents to ED with shortness of breath, found to be hypoxic.  He was admitted in November for pneumoniae and discharged on levofloxacin.  CXR reveals atypical pneumonia vs edema.  CT reveals right upper lobe pulmonary nodule with right paratracheal adenopathy, concerning for malignancy./  UA with significant pyuria. Vancomycin per pharmacy and zosyn ordered per MD  12/21 >>vancomycin 1g q24  >> 12/21 >>zosyn 3.375g IV  extended interval q8  >>    Tmax: afebrile WBCs: slightly improved Renal: SCr = 3.22, continues to rise, with h/o CKD, CrCl now only est 18 ml/min  11/18 urine:  E. Aerogenes (R to cefazolin, I NTF) 12/21 blood: ngtd 12/21 UA: positive  Goal of Therapy:  Vancomycin trough level 15-20 mcg/ml  Plan:  1) Due to rising SCr, will again change vancomycin from 750mg  q24 to 1g q48 with next dose 12/24 PM 2) Change Zosyn dosing to 2.25g q6 due to CrCl < 20 ml/min 3) Will check a random vanc in AM 12/24 to evaluate how well patient clearing vancomycin   Hessie KnowsJustin M Kaileia Flow, PharmD, BCPS Pager [phone] 06/22/2014 8:50 AM

## 2014-06-23 ENCOUNTER — Telehealth: Payer: Self-pay | Admitting: *Deleted

## 2014-06-23 ENCOUNTER — Inpatient Hospital Stay (HOSPITAL_COMMUNITY): Payer: Medicare Other

## 2014-06-23 DIAGNOSIS — J432 Centrilobular emphysema: Secondary | ICD-10-CM

## 2014-06-23 DIAGNOSIS — J8410 Pulmonary fibrosis, unspecified: Secondary | ICD-10-CM

## 2014-06-23 LAB — BASIC METABOLIC PANEL
Anion gap: 7 (ref 5–15)
BUN: 66 mg/dL — ABNORMAL HIGH (ref 6–23)
CALCIUM: 8.5 mg/dL (ref 8.4–10.5)
CO2: 23 mmol/L (ref 19–32)
Chloride: 110 mEq/L (ref 96–112)
Creatinine, Ser: 3.46 mg/dL — ABNORMAL HIGH (ref 0.50–1.35)
GFR calc Af Amer: 17 mL/min — ABNORMAL LOW (ref >90)
GFR, EST NON AFRICAN AMERICAN: 15 mL/min — AB (ref >90)
GLUCOSE: 87 mg/dL (ref 70–99)
Potassium: 4.5 mmol/L (ref 3.5–5.1)
Sodium: 140 mmol/L (ref 135–145)

## 2014-06-23 LAB — GLUCOSE, CAPILLARY
GLUCOSE-CAPILLARY: 497 mg/dL — AB (ref 70–99)
Glucose-Capillary: 89 mg/dL (ref 70–99)
Glucose-Capillary: 124 mg/dL — ABNORMAL HIGH (ref 70–99)
Glucose-Capillary: 455 mg/dL — ABNORMAL HIGH (ref 70–99)
Glucose-Capillary: 478 mg/dL — ABNORMAL HIGH (ref 70–99)

## 2014-06-23 LAB — VANCOMYCIN, RANDOM: Vancomycin Rm: 20.9 ug/mL

## 2014-06-23 MED ORDER — CEFUROXIME AXETIL 250 MG PO TABS
250.0000 mg | ORAL_TABLET | Freq: Two times a day (BID) | ORAL | Status: AC
  Administered 2014-06-23 – 2014-06-28 (×10): 250 mg via ORAL
  Filled 2014-06-23 (×11): qty 1

## 2014-06-23 MED ORDER — PREDNISONE 20 MG PO TABS
40.0000 mg | ORAL_TABLET | Freq: Every day | ORAL | Status: DC
  Administered 2014-06-24 – 2014-06-25 (×2): 40 mg via ORAL
  Filled 2014-06-23 (×2): qty 2

## 2014-06-23 MED ORDER — INSULIN GLARGINE 100 UNIT/ML ~~LOC~~ SOLN
20.0000 [IU] | Freq: Every day | SUBCUTANEOUS | Status: DC
  Administered 2014-06-23 – 2014-06-25 (×3): 20 [IU] via SUBCUTANEOUS
  Filled 2014-06-23 (×3): qty 0.2

## 2014-06-23 MED ORDER — SODIUM CHLORIDE 0.9 % IV BOLUS (SEPSIS)
1000.0000 mL | Freq: Once | INTRAVENOUS | Status: AC
  Administered 2014-06-23: 1000 mL via INTRAVENOUS

## 2014-06-23 MED ORDER — SODIUM CHLORIDE 0.9 % IV SOLN
INTRAVENOUS | Status: DC
  Administered 2014-06-23 (×2): via INTRAVENOUS

## 2014-06-23 MED ORDER — IPRATROPIUM-ALBUTEROL 0.5-2.5 (3) MG/3ML IN SOLN
3.0000 mL | Freq: Three times a day (TID) | RESPIRATORY_TRACT | Status: DC
  Administered 2014-06-23 – 2014-06-30 (×22): 3 mL via RESPIRATORY_TRACT
  Filled 2014-06-23 (×22): qty 3

## 2014-06-23 MED ORDER — ALBUTEROL SULFATE (2.5 MG/3ML) 0.083% IN NEBU
2.5000 mg | INHALATION_SOLUTION | RESPIRATORY_TRACT | Status: DC | PRN
  Administered 2014-06-24: 2.5 mg via RESPIRATORY_TRACT
  Filled 2014-06-23: qty 3

## 2014-06-23 NOTE — Progress Notes (Signed)
PROGRESS NOTE  STACI CARVER NFA:[id number] DOB: 09-24-1928 DOA: 06/20/2014 PCP: [name], MD  Brief history 80 year old male with a history of COPD, paroxysmal A. fib, CKD stage III, diabetes mellitus, hypertension, thoracic aortic aneurysm repair presents with 3 day history of worsening shortness of breath. Notably, the patient was recently admitted to the hospital from November 18 through 05/21/2014 for pneumonia. The patient was discharged home with levofloxacin. The patient presented to his primary care physician's office on the day of admission when he was noted to have oxygen saturation of 80%. He was sent to the emergency department. In the emergency department, the patient was noted to have oxygen saturation of 63% on room air. CT of the chest showed "mixed density surrounding tubular calcification in the ascending thoracic aorta potentially from chronic dissection, chronic muralthrombus, or even intramural hematoma. Some of this appearance could be from an ascending aortic graft". CT of the abdomen and pelvis was negative for any acute findings, but it does show prominent stool.   Assessment/Plan: Acute respiratory failure -Likely multifactorial with the possibility of pulmonary edema, pneumonia (HCAP), and consideration for underlying ILD or BOOP -The patient is stable on a [place] 5L-6L -Treat with steroids as recommended by PCCM -The patient did not have any significant clinical improvement with intravenous furosemide x 4 doses; and given increase in Cr will hold on further lasix -will follow rec's per Dr. Kendrick Fries  Pulmonary opacities/infiltrates -No significant clinical improvement with intravenous furosemide 4 doses -will follow pulmonary rec's -since no clear signs of lung infection will narrow antibiotics to cover only UTI -Blood cultures 2 sets--Unfortunately, this was not initially ordered, and Zosyn was given prior to collection of the blood cultures -check  procalcitonin-->0.67 -Continue bronchodilators -lasix now on hold due to acute component of renal failure  Elevated proBNP  -Although the patient has CKD stage III which may elevate BNP, this is more elevated than his prior admission  -Although the patient has some JVD on examination, he does not appear to be clinically fluid overloaded  -11/02/13 Echo did not show any RV dysfunction, TR, or pulm HTN  -repeated echo pending -I/O's, daily weights -low sodium diet  Pyuria -TNTC WBC in urine  -continue zosyn; will narrow to ceftin for 5 more days to complete tx for UTI -unfortunately, urine culture was not sent from ED  Acute on CKD stage III  -Baseline creatinine 1.7-2.1  -due to diuresis and presumed UTI -will hold lasix today -Cr up to 3.46  Altered mental status -likely due to pt infection/hypoxemia -resolved now -The patient is A&Ox3  -CT brain neg for acute findings  COPD  -Cannot rule out possibility of underlying BOOP/ILD -continue bronchodilators -no wheezing presently -per PCCM will continue steroids for potential ILD  Paroxysmal Atrial fibrillation  -Rate controlled  -Presently in sinus rhythm  -Discontinue apixiban given the patient's recent history of multiple falls  -continue aspirin  -discontinue amiodarone, due to current lung issues   Diabetes mellitus type 2  -05/18/2014 hemoglobin A1c 6.5  -Give half home dose of Lantus  -NovoLog sliding scale  -will adjust coverage as needed while on steroids  Hypertension  -Continue metoprolol tartrate  -Hold amlodipine -Hold Cardura -will also hold lasix given renal failure  coronary artery disease  -Patient had CABG in 2006 -Currently chest pain-free -Continue metoprolol tartrate   Thoracic Aortic Aneursym -s/p repair -changes on CT likely due to previous repair -continue outpatient follow up  Constipation -colace and bisacodyl -will use enema if no improvement  Family Communication:    No family at bedside Disposition Plan:   Home when medically stable   Antibiotics: Vancomycin 06/20/14>>> Zosyn 06/20/14>>> zithromax 12/22>>   Procedures/Studies: Ct Abdomen Pelvis Wo Contrast  06/20/2014   CLINICAL DATA:  Fall in bathroom. Anti coagulation. Increased confusion.  EXAM: CT CHEST, ABDOMEN AND PELVIS WITHOUT CONTRAST  TECHNIQUE: Multidetector CT imaging of the chest, abdomen and pelvis was performed following the standard protocol without IV contrast.  COMPARISON:  Multiple exams, including 05/19/2014 and 03/26/2005  FINDINGS: CT CHEST FINDINGS  Right upper paratracheal node 1.2 cm in short axis, image 12 series 2. Subcarinal node 1.4 cm in short axis, image 34 series 2. Additional paratracheal nodes are present. If hilar nodes are enlarged there difficult to measure against the background vasculature.  Aortic and branch vessel atherosclerosis. Prior CABG. Ascending thoracic aorta 4.6 cm transverse ; descending thoracic aorta 4.6 cm transverse. Lower descending thoracic aorta 5.0 cm transverse. There is mixed density surrounding tubular calcification in the ascending thoracic aorta potentially from chronic dissection, chronic mural thrombus, or even intramural hematoma. Some of this appearance could be from an ascending aortic graft-correlate with patient history. Without IV contrast did is difficult to characterize this further.  Trace left pleural effusion. Mild enlargement of the cardiopericardial silhouette  Emphysema is present with scattered scarring and peripheral fibrosis potentially with some early honeycombing at the lung bases. In addition, there is a 2.4 by 1.4 cm nodule anteriorly in the right upper lobe, image 25 of series 5 interstitial accentuation is present bilaterally.  CT ABDOMEN AND PELVIS FINDINGS  Hepatobiliary: Unremarkable  Pancreas: Speckled calcifications throughout the pancreatic parenchyma compatible with chronic calcific pancreatitis.  Spleen: Unremarkable   Adrenals/Urinary Tract: 8 mm left kidney lower pole nonobstructive calculus. Bilateral fluid density renal lesions favoring cysts. No hydronephrosis or hydroureter. No ureteral calculi.  Stomach/Bowel: Prominent stool throughout the colon favors constipation.  Vascular/Lymphatic: Dense aortoiliac atherosclerosis.  Reproductive: Enlarged prostate gland with scattered calcifications, measuring 5.8 by 4.6 cm.  Other: No supplemental non-categorized findings.  Musculoskeletal: Bridging fusion of the sacroiliac joints. posterolateral rod and pedicle screw fixation at L4-5 with interbody and anterior facet fusion. Intervertebral and facet spurring at L3-4 and L5-S1. Small bilateral inguinal hernias contain adipose tissue.  IMPRESSION: 1. 2.4 by 1.4 cm right upper lobe pulmonary nodule with right paratracheal adenopathy, concerning for malignancy. An atypical infectious process could potentially give this appearance. Consider nuclear medicine PET-CT. 2. Severe emphysema with peripheral fibrosis, interstitial accentuation, and some basilar honeycombing. 3. Ascending and descending thoracic aortic aneurysms measuring up to 5 cm transversely. There is a mixed density in the ascending aortic aneurysm with a complex appearance, possibly from graft material and excluded thrombus, chronic mural thrombus, chronic dissection, or intramural hematoma. Correlate with operative history. If IV contrast could not be administered, MRI might help further assess the thoracic aorta. Otherwise I recommend semi-annual imaging followup by CTA or MRA and referral to cardiothoracic surgery if not already obtained. This recommendation follows 2010 ACCF/AHA/AATS/ACR/ASA/SCA/SCAI/SIR/STS/SVM Guidelines for the Diagnosis and Management of Patients With Thoracic Aortic Disease. Circulation. 2010; 121: Z610-R604. 4. Trace left pleural effusion. 5. Chronic calcific pancreatitis. 6.  Prominent stool throughout the colon favors constipation. 7. Enlarged  prostate gland. 8. Lumbar spondylosis particularly at L3-4 and L5-S1.   Electronically Signed   By: Herbie Baltimore M.D.   On: 06/20/2014 13:58   Ct Head Wo Contrast  06/20/2014   CLINICAL  DATA:  Fall.  Hypoxia.  EXAM: CT HEAD WITHOUT CONTRAST  CT CERVICAL SPINE WITHOUT CONTRAST  TECHNIQUE: Multidetector CT imaging of the head and cervical spine was performed following the standard protocol without intravenous contrast. Multiplanar CT image reconstructions of the cervical spine were also generated.  COMPARISON:  Report from 11/24/2000  FINDINGS: CT HEAD FINDINGS  Hypodense lesion in the right middle cranial fossa tracking up into the sylvian fissure, fluid density, probably an arachnoid cyst or epidermoid. Size 4.0 by 3.2 by approximately 6.0 cm.  Atherosclerosis noted. The brainstem, cerebellum, cerebral peduncle sits, thalami, and basal ganglia appear unremarkable. The ventricular system appears normal and aside from the right middle cranial fossa lesion the basilar cisterns appear normal.  Periventricular white matter and corona radiata hypodensities favor chronic ischemic microvascular white matter disease. No intracranial hemorrhage, mass lesion, or acute CVA. There is a subcutaneous lesion along the occiput/upper neck in the midline on image 1 of series 3 which could represent subcutaneous bruising. Chronic left frontal sinusitis.  CT CERVICAL SPINE FINDINGS  Considerable degenerative findings at the craniocervical junction and anterior arch of C1-2 with extensive spurring of the basion and pannus formation posterior to the odontoid. Prominent loss of articular space at the anterior C1-2 articulation.  Uncinate and facet spurring cause osseous foraminal stenosis on the right at C2-3 and on the left at C3-4, C4-5, and C6-7. There is interbody fusion at the C5-6 low-level and facet fusion on the left at C4-5 and C7-T1.  There is 2 mm degenerative anterolisthesis at C4-5 and at C7-T1. Degenerative endplate  sclerosis noted with posterior osseous ridging at C6-7, C7-T1, and T1-2.  No prevertebral soft tissue swelling. No acute fracture is identified. There is dextroconvex cervical scoliosis.  Prominent emphysema and scarring noted at the lung apices.  IMPRESSION: 1. Right middle cranial fossa arachnoid cyst or epidermoid tracking into the sylvian fissure. MRI can typically differentiate between these 2 entities if clinically warranted. 2. Atherosclerosis. 3. Dense subcutaneous lesion along the occiput but and upper neck could represent some focal bruising. 4. Chronic left frontal sinusitis. 5. Cervical spondylosis causes multilevel osseous foraminal impingement. 6. Considerable spurring at the C1- 2 articulation and along the basion, with pannus formation posterior to the odontoid-rheumatoid arthropathy not excluded. 7. Emphysema.   Electronically Signed   By: Herbie Baltimore M.D.   On: 06/20/2014 13:36   Ct Chest Wo Contrast  06/20/2014   CLINICAL DATA:  Fall in bathroom. Anti coagulation. Increased confusion.  EXAM: CT CHEST, ABDOMEN AND PELVIS WITHOUT CONTRAST  TECHNIQUE: Multidetector CT imaging of the chest, abdomen and pelvis was performed following the standard protocol without IV contrast.  COMPARISON:  Multiple exams, including 05/19/2014 and 03/26/2005  FINDINGS: CT CHEST FINDINGS  Right upper paratracheal node 1.2 cm in short axis, image 12 series 2. Subcarinal node 1.4 cm in short axis, image 34 series 2. Additional paratracheal nodes are present. If hilar nodes are enlarged there difficult to measure against the background vasculature.  Aortic and branch vessel atherosclerosis. Prior CABG. Ascending thoracic aorta 4.6 cm transverse ; descending thoracic aorta 4.6 cm transverse. Lower descending thoracic aorta 5.0 cm transverse. There is mixed density surrounding tubular calcification in the ascending thoracic aorta potentially from chronic dissection, chronic mural thrombus, or even intramural  hematoma. Some of this appearance could be from an ascending aortic graft-correlate with patient history. Without IV contrast did is difficult to characterize this further.  Trace left pleural effusion. Mild enlargement of the cardiopericardial silhouette  Emphysema is present with scattered scarring and peripheral fibrosis potentially with some early honeycombing at the lung bases. In addition, there is a 2.4 by 1.4 cm nodule anteriorly in the right upper lobe, image 25 of series 5 interstitial accentuation is present bilaterally.  CT ABDOMEN AND PELVIS FINDINGS  Hepatobiliary: Unremarkable  Pancreas: Speckled calcifications throughout the pancreatic parenchyma compatible with chronic calcific pancreatitis.  Spleen: Unremarkable  Adrenals/Urinary Tract: 8 mm left kidney lower pole nonobstructive calculus. Bilateral fluid density renal lesions favoring cysts. No hydronephrosis or hydroureter. No ureteral calculi.  Stomach/Bowel: Prominent stool throughout the colon favors constipation.  Vascular/Lymphatic: Dense aortoiliac atherosclerosis.  Reproductive: Enlarged prostate gland with scattered calcifications, measuring 5.8 by 4.6 cm.  Other: No supplemental non-categorized findings.  Musculoskeletal: Bridging fusion of the sacroiliac joints. posterolateral rod and pedicle screw fixation at L4-5 with interbody and anterior facet fusion. Intervertebral and facet spurring at L3-4 and L5-S1. Small bilateral inguinal hernias contain adipose tissue.  IMPRESSION: 1. 2.4 by 1.4 cm right upper lobe pulmonary nodule with right paratracheal adenopathy, concerning for malignancy. An atypical infectious process could potentially give this appearance. Consider nuclear medicine PET-CT. 2. Severe emphysema with peripheral fibrosis, interstitial accentuation, and some basilar honeycombing. 3. Ascending and descending thoracic aortic aneurysms measuring up to 5 cm transversely. There is a mixed density in the ascending aortic aneurysm  with a complex appearance, possibly from graft material and excluded thrombus, chronic mural thrombus, chronic dissection, or intramural hematoma. Correlate with operative history. If IV contrast could not be administered, MRI might help further assess the thoracic aorta. Otherwise I recommend semi-annual imaging followup by CTA or MRA and referral to cardiothoracic surgery if not already obtained. This recommendation follows 2010 ACCF/AHA/AATS/ACR/ASA/SCA/SCAI/SIR/STS/SVM Guidelines for the Diagnosis and Management of Patients With Thoracic Aortic Disease. Circulation. 2010; 121: Z610-[id number]. 4. Trace left pleural effusion. 5. Chronic calcific pancreatitis. 6.  Prominent stool throughout the colon favors constipation. 7. Enlarged prostate gland. 8. Lumbar spondylosis particularly at L3-4 and L5-S1.   Electronically Signed   By: Herbie BaltimoreWalt  Liebkemann M.D.   On: 06/20/2014 13:58   Ct Cervical Spine Wo Contrast  06/20/2014   CLINICAL DATA:  Fall.  Hypoxia.  EXAM: CT HEAD WITHOUT CONTRAST  CT CERVICAL SPINE WITHOUT CONTRAST  TECHNIQUE: Multidetector CT imaging of the head and cervical spine was performed following the standard protocol without intravenous contrast. Multiplanar CT image reconstructions of the cervical spine were also generated.  COMPARISON:  Report from 11/24/2000  FINDINGS: CT HEAD FINDINGS  Hypodense lesion in the right middle cranial fossa tracking up into the sylvian fissure, fluid density, probably an arachnoid cyst or epidermoid. Size 4.0 by 3.2 by approximately 6.0 cm.  Atherosclerosis noted. The brainstem, cerebellum, cerebral peduncle sits, thalami, and basal ganglia appear unremarkable. The ventricular system appears normal and aside from the right middle cranial fossa lesion the basilar cisterns appear normal.  Periventricular white matter and corona radiata hypodensities favor chronic ischemic microvascular white matter disease. No intracranial hemorrhage, mass lesion, or acute CVA. There is a  subcutaneous lesion along the occiput/upper neck in the midline on image 1 of series 3 which could represent subcutaneous bruising. Chronic left frontal sinusitis.  CT CERVICAL SPINE FINDINGS  Considerable degenerative findings at the craniocervical junction and anterior arch of C1-2 with extensive spurring of the basion and pannus formation posterior to the odontoid. Prominent loss of articular space at the anterior C1-2 articulation.  Uncinate and facet spurring cause osseous foraminal stenosis on the right at C2-3 and on  the left at C3-4, C4-5, and C6-7. There is interbody fusion at the C5-6 low-level and facet fusion on the left at C4-5 and C7-T1.  There is 2 mm degenerative anterolisthesis at C4-5 and at C7-T1. Degenerative endplate sclerosis noted with posterior osseous ridging at C6-7, C7-T1, and T1-2.  No prevertebral soft tissue swelling. No acute fracture is identified. There is dextroconvex cervical scoliosis.  Prominent emphysema and scarring noted at the lung apices.  IMPRESSION: 1. Right middle cranial fossa arachnoid cyst or epidermoid tracking into the sylvian fissure. MRI can typically differentiate between these 2 entities if clinically warranted. 2. Atherosclerosis. 3. Dense subcutaneous lesion along the occiput but and upper neck could represent some focal bruising. 4. Chronic left frontal sinusitis. 5. Cervical spondylosis causes multilevel osseous foraminal impingement. 6. Considerable spurring at the C1- 2 articulation and along the basion, with pannus formation posterior to the odontoid-rheumatoid arthropathy not excluded. 7. Emphysema.   Electronically Signed   By: Herbie Baltimore M.D.   On: 06/20/2014 13:36   Dg Chest Port 1 View  06/23/2014   CLINICAL DATA:  Acute respiratory failure, hypoxia  EXAM: PORTABLE CHEST - 1 VIEW  COMPARISON:  06/20/2014  FINDINGS: Cardiomediastinal silhouette is stable. Again noted reticular diffuse interstitial prominence bilaterally probable due to  chronic interstitial lung disease and fibrotic changes. No definite superimposed infiltrate or pulmonary edema. Poorly visualized nodule in right upper lobe again noted measures about 2 cm. This was better visualized on recent CT scan.  IMPRESSION: Again noted diffuse reticular interstitial prominence probable due to chronic interstitial lung disease and fibrotic changes. No definite superimposed infiltrate. Again noted nodule in right upper lobe medially. This was better visualized on recent CT scan.   Electronically Signed   By: Natasha Mead M.D.   On: 06/23/2014 11:01   Dg Chest Portable 1 View  06/20/2014   CLINICAL DATA:  Fall.  Pneumonia.  Shortness of breath.  Confusion.  EXAM: PORTABLE CHEST - 1 VIEW  COMPARISON:  05/19/2014  FINDINGS: Diffuse interstitial and patchy airspace opacities are present superimposed on emphysema. Atherosclerotic aortic arch and prior CABG. Heart size within normal limits for projection. Stable biapical pleural parenchymal scarring. No pneumothorax observed. No definite blunting of the costophrenic angles.  IMPRESSION: 1. Bilateral interstitial and airspace opacities, worsened, superimposed on severe emphysema. Appearance could reflect edema or atypical pneumonia.   Electronically Signed   By: Herbie Baltimore M.D.   On: 06/20/2014 12:09    Subjective: Patient is feeling subjectively better; O@ down to [place] 5-6L; kidney function slightly worse (Cr is elevated at 3.46)  Objective: Filed Vitals:   06/23/14 0441 06/23/14 0946 06/23/14 1322 06/23/14 1402  BP: 141/54  118/47   Pulse: 68  64   Temp: 98 F (36.7 C)  97.3 F (36.3 C)   TempSrc: Axillary  Oral   Resp: 16  16   Height:      Weight: 72.3 kg (159 lb 6.3 oz)     SpO2: 100% 96% 96% 95%    Intake/Output Summary (Last 24 hours) at 06/23/14 1606 Last data filed at 06/23/14 1330  Gross per 24 hour  Intake   1020 ml  Output   1050 ml  Net    -30 ml   Weight change: -1.1 kg (-2 lb 6.8  oz) Exam:   General:  Pt is alert, follows commands appropriately, breathing better and today is first day down from NRB to [place] 5-6L  HEENT: No icterus, No thrush, [place]/AT  Cardiovascular: RRR, S1/S2, no rubs, no gallops, +JVD; positive SEM  Respiratory: no crackles, no wheezing  Abdomen: Soft/+BS, non tender, non distended, no guarding  Extremities: No edema, No lymphangitis, No petechiae, No rashes, no synovitis  Data Reviewed: Basic Metabolic Panel:  Recent Labs Lab 06/20/14 1201 06/21/14 0455 06/22/14 0515 06/23/14 0420  NA 145 142 137 140  K 4.5 4.1 4.1 4.5  CL 108 109 107 110  CO2 19 24 23 23   GLUCOSE 208* 136* 175* 87  BUN 41* 47* 59* 66*  CREATININE 2.09* 2.49* 3.22* 3.46*  CALCIUM 9.4 8.4 8.0* 8.5   Liver Function Tests:  Recent Labs Lab 06/20/14 1201  AST 26  ALT 15  ALKPHOS 85  BILITOT 1.0  PROT 8.2  ALBUMIN 3.3*   CBC:  Recent Labs Lab 06/20/14 1201 06/21/14 0455 06/22/14 0515  WBC 11.8* 10.5 9.1  NEUTROABS 9.9*  --   --   HGB 10.0* 9.0* 8.4*  HCT 32.7* 29.4* 28.2*  MCV 94.2 94.5 96.2  PLT 211 197 210   Cardiac Enzymes:  Recent Labs Lab 06/20/14 1201  TROPONINI <0.30   BNP: Invalid input(s): POCBNP CBG:  Recent Labs Lab 06/22/14 0753 06/22/14 1154 06/22/14 1743 06/22/14 2147 06/23/14 0443  GLUCAP 164* 183* 194* 211* 89    Recent Results (from the past 240 hour(s))  Culture, blood (routine x 2)     Status: None (Preliminary result)   Collection Time: 06/20/14  5:42 PM  Result Value Ref Range Status   Specimen Description BLOOD RIGHT ARM  Final   Special Requests BOTTLES DRAWN AEROBIC AND ANAEROBIC 10CC  Final   Culture  Setup Time   Final    06/21/2014 02:53 Performed at [facility]    Culture   Final           BLOOD CULTURE RECEIVED NO GROWTH TO DATE CULTURE WILL BE HELD FOR 5 DAYS BEFORE ISSUING A FINAL NEGATIVE REPORT Performed at [facility]    Report Status PENDING  Incomplete  Culture,  blood (routine x 2)     Status: None (Preliminary result)   Collection Time: 06/20/14  5:50 PM  Result Value Ref Range Status   Specimen Description BLOOD LEFT HAND  Final   Special Requests BOTTLES DRAWN AEROBIC ONLY 5CC  Final   Culture  Setup Time   Final    06/21/2014 02:56 Performed at [facility]    Culture   Final           BLOOD CULTURE RECEIVED NO GROWTH TO DATE CULTURE WILL BE HELD FOR 5 DAYS BEFORE ISSUING A FINAL NEGATIVE REPORT Performed at [facility]    Report Status PENDING  Incomplete     Scheduled Meds: . aspirin EC  81 mg Oral Q breakfast  . bisacodyl  10 mg Rectal Once  . docusate sodium  100 mg Oral BID  . ezetimibe  10 mg Oral Q breakfast  . ferrous sulfate  325 mg Oral Q breakfast  . gabapentin  300 mg Oral Q24H  . gabapentin  600 mg Oral BID  . heparin  5,000 Units Subcutaneous 3 times per day  . insulin aspart  0-9 Units Subcutaneous TID WC  . insulin glargine  10 Units Subcutaneous QHS  . ipratropium-albuterol  3 mL Nebulization TID  . methylPREDNISolone (SOLU-MEDROL) injection  125 mg Intravenous Q12H  . metoprolol  25 mg Oral BID  . omega-3 acid ethyl esters  1 g Oral BID  .  oxyCODONE-acetaminophen  1 tablet Oral Q24H  . oxyCODONE-acetaminophen  2 tablet Oral BID  . [START ON 06/24/2014] predniSONE  40 mg Oral Q breakfast  . sodium chloride  3 mL Intravenous Q12H   Continuous Infusions: . sodium chloride 75 mL/hr at 06/23/14 1201     Marcha Dutton  [facility] Pager [phone]  If 7PM-7AM, please contact night-coverage [email or web address] Password [email or web address] 06/23/2014, 4:06 PM   LOS: 3 days

## 2014-06-23 NOTE — Progress Notes (Signed)
PULMONARY / CRITICAL CARE MEDICINE   Name: [name] [name] MRN: [id number] DOB: 03/26/1929    ADMISSION DATE:  06/20/2014 CONSULTATION DATE:  06/21/2014  REFERRING MD :   Tat  CHIEF COMPLAINT: Shortness of breath  INITIAL PRESENTATION: 80 year old male with no prior pulmonary history was admitted on 06/20/2014 with acute hypoxemic respiratory failure after an upper respiratory infection syndrome.  STUDIES:  12/21 CT chest >> there is notable emphysema in the lungs bilaterally with honeycombing in the bases and interstitial thickening, overall picture worrisome for interstitial lung disease superimposed on a background of emphysema. See radiology report for other findings including aorta, status post grafting. 12/22  ECHO >> EF 50-55%, mod LVH, PA Peak 41, dilated descending thoracic aorta 4.4 cm   SIGNIFICANT EVENTS: 12/22  PCCM consulted for hypoxemic respiratory failure 12/23  Remains on 15L O2, started solumedrol; ILD serology labs negative 12/24  O2 requirements down to 5L   SUBJECTIVE: Feeling well today, improved mood, energy level, breathing much improved    VITAL SIGNS: Temp:  [97.8 F (36.6 C)-98.4 F (36.9 C)] 98 F (36.7 C) (12/24 0441) Pulse Rate:  [54-68] 68 (12/24 0441) Resp:  [16] 16 (12/24 0441) BP: (103-141)/(40-54) 141/54 mmHg (12/24 0441) SpO2:  [94 %-100 %] 96 % (12/24 0946) FiO2 (%):  [75 %-100 %] 75 % (12/24 0441) Weight:  [72.3 kg (159 lb 6.3 oz)] 72.3 kg (159 lb 6.3 oz) (12/24 0441)  VENTILATOR SETTINGS: Vent Mode:  [-]  FiO2 (%):  [75 %-100 %] 75 %   INTAKE / OUTPUT:  Intake/Output Summary (Last 24 hours) at 06/23/14 1023 Last data filed at 06/23/14 0543  Gross per 24 hour  Intake    540 ml  Output    850 ml  Net   -310 ml    PHYSICAL EXAMINATION: General:  Comfortable in bed Neuro: A&OX4 HEENT: NCAT, OP Clear PULM: Crackles bilaterally CV: RRR, no mgr Ab: BS+, soft Ext; warm, no edema   LABS:  CBC  Recent Labs Lab  06/20/14 1201 06/21/14 0455 06/22/14 0515  WBC 11.8* 10.5 9.1  HGB 10.0* 9.0* 8.4*  HCT 32.7* 29.4* 28.2*  PLT 211 197 210   Coag's  Recent Labs Lab 06/20/14 1201  INR 1.41   BMET  Recent Labs Lab 06/21/14 0455 06/22/14 0515 06/23/14 0420  NA 142 137 140  K 4.1 4.1 4.5  CL 109 107 110  CO2 24 23 23   BUN 47* 59* 66*  CREATININE 2.49* 3.22* 3.46*  GLUCOSE 136* 175* 87   Electrolytes  Recent Labs Lab 06/21/14 0455 06/22/14 0515 06/23/14 0420  CALCIUM 8.4 8.0* 8.5   Sepsis Markers  Recent Labs Lab 06/20/14 1207 06/20/14 1742 06/22/14 0515  LATICACIDVEN 2.26*  --   --   PROCALCITON  --  0.67 1.33   ABG  Recent Labs Lab 06/20/14 1301  PHART 7.409  PCO2ART 34.0*  PO2ART 84.6   Liver Enzymes  Recent Labs Lab 06/20/14 1201  AST 26  ALT 15  ALKPHOS 85  BILITOT 1.0  ALBUMIN 3.3*   Cardiac Enzymes  Recent Labs Lab 06/20/14 1201 06/20/14 1202  TROPONINI <0.30  --   PROBNP  --  14250.0*   Glucose  Recent Labs Lab 06/21/14 2128 06/22/14 0753 06/22/14 1154 06/22/14 1743 06/22/14 2147 06/23/14 0443  GLUCAP 216* 164* 183* 194* 211* 89    Imaging    ASSESSMENT / PLAN:  PULMONARY A:  Acute hypoxemic respiratory failure: See previous discussion for DDX, Given improvement  with steroids and holding amiodarone (and lack of response to antibiotics and diuretics), I think this is related to his amiodarone Severe underlying emphysema, not in exacerbation PAH - pa peak 41 on ECHO P:   -continue solumedrol 12/24, start prednisone 12/25, would d/c home on 30mg  prednisone until follow up in pulmonary clinic -continue O2 as needed for saturations > 88% -hold amiodarone for life -will need O2 eval prior to discharge -will make arrangements for hospital follow up with McQuaid and or Tammy Parrett in pulmonary clinic with a CXR  CARDIOVASCULAR A:  Acute diastolic heart failure? Has elevated JVD, proBNP Afib  P:  -hold further diuresis  with rise of serum cr  -may need cardiology to see him after stopping amiodarone (follows with Dr. Tresa EndoKelly), would recommend rate control for now  INFECTIOUS U. Legionella 12/22 >>  RVF 12/22 >>  BCx2 12/21 >>  A:  Don't think he has HCAP P: Agree with stopping antibiotics  We will see once over the holiday weekend, and will be available as needed  Heber CarolinaBrent McQuaid, MD [facility] Pager: [phone] Cell: [phone] If no response, call [phone]

## 2014-06-23 NOTE — Telephone Encounter (Signed)
-----   Message from Lupita Leashouglas B McQuaid, MD sent at 06/23/2014 10:28 AM EST ----- Hi,  Please arrange hospital f/u with me or Tammy for sometime in the next 2-3 weeks.  Needs a CXR on day of visit, reason: pulmonary fibrosis  Thanks B

## 2014-06-23 NOTE — Telephone Encounter (Signed)
Left message for pt to call back x1 

## 2014-06-24 DIAGNOSIS — J8410 Pulmonary fibrosis, unspecified: Secondary | ICD-10-CM

## 2014-06-24 LAB — GLUCOSE, CAPILLARY
GLUCOSE-CAPILLARY: 426 mg/dL — AB (ref 70–99)
Glucose-Capillary: 466 mg/dL — ABNORMAL HIGH (ref 70–99)
Glucose-Capillary: 359 mg/dL — ABNORMAL HIGH (ref 70–99)
Glucose-Capillary: 204 mg/dL — ABNORMAL HIGH (ref 70–99)
Glucose-Capillary: 366 mg/dL — ABNORMAL HIGH (ref 70–99)

## 2014-06-24 LAB — PROCALCITONIN: Procalcitonin: 0.53 ng/mL

## 2014-06-24 MED ORDER — INSULIN ASPART 100 UNIT/ML ~~LOC~~ SOLN
15.0000 [IU] | Freq: Once | SUBCUTANEOUS | Status: AC
  Administered 2014-06-24: 15 [IU] via SUBCUTANEOUS

## 2014-06-24 MED ORDER — INSULIN ASPART 100 UNIT/ML ~~LOC~~ SOLN
0.0000 [IU] | Freq: Three times a day (TID) | SUBCUTANEOUS | Status: DC
  Administered 2014-06-24: 15 [IU] via SUBCUTANEOUS
  Administered 2014-06-25: 11 [IU] via SUBCUTANEOUS
  Administered 2014-06-25 (×2): 5 [IU] via SUBCUTANEOUS
  Administered 2014-06-26: 3 [IU] via SUBCUTANEOUS
  Administered 2014-06-26: 2 [IU] via SUBCUTANEOUS
  Administered 2014-06-26 – 2014-06-27 (×2): 8 [IU] via SUBCUTANEOUS
  Administered 2014-06-27: 11 [IU] via SUBCUTANEOUS
  Administered 2014-06-27 – 2014-06-28 (×2): 8 [IU] via SUBCUTANEOUS
  Administered 2014-06-28: 11 [IU] via SUBCUTANEOUS
  Administered 2014-06-28: 20 [IU] via SUBCUTANEOUS
  Administered 2014-06-29: 15 [IU] via SUBCUTANEOUS
  Administered 2014-06-29 (×2): 8 [IU] via SUBCUTANEOUS
  Administered 2014-06-30: 5 [IU] via SUBCUTANEOUS

## 2014-06-24 NOTE — Progress Notes (Signed)
PROGRESS NOTE  [name] MAU WUJ:[id number] DOB: [birth year] DOA: 06/20/2014 PCP: [name], MD  Brief history 80 year old male with a history of COPD, paroxysmal A. fib, CKD stage III, diabetes mellitus, hypertension, thoracic aortic aneurysm repair presents with 3 day history of worsening shortness of breath. Notably, the patient was recently admitted to the hospital from November 18 through 05/21/2014 for pneumonia. The patient was discharged home with levofloxacin. The patient presented to his primary care physician's office on the day of admission when he was noted to have oxygen saturation of 80%. He was sent to the emergency department. In the emergency department, the patient was noted to have oxygen saturation of 63% on room air. CT of the chest showed "mixed density surrounding tubular calcification in the ascending thoracic aorta potentially from chronic dissection, chronic muralthrombus, or even intramural hematoma. Some of this appearance could be from an ascending aortic graft". CT of the abdomen and pelvis was negative for any acute findings, but it does show prominent stool.   Assessment/Plan: Acute respiratory failure -Likely multifactorial with the possibility of pulmonary edema, pneumonia (HCAP), and consideration for underlying ILD or BOOP -The patient is stable on a [place] 5L-6L -Treat with steroids as recommended by PCCM; transitioned to PO prednisone -The patient did not have any significant clinical improvement with intravenous furosemide x 4 doses; and given increase in Cr -will hold on further lasix -will follow rec's per Dr. Kendrick Fries  Pulmonary opacities/infiltrates -No significant clinical improvement with intravenous furosemide 4 doses -will follow pulmonary rec's -since no clear signs of lung infection will narrow antibiotics to cover only UTI -Blood cultures 2 sets--Unfortunately, this was not initially ordered, and Zosyn was given prior to  collection of the blood cultures -check procalcitonin-->0.67 -Continue bronchodilators -lasix now on hold due to acute component of renal failure  Elevated proBNP  -Although the patient has CKD stage III which may elevate BNP, this is more elevated than his prior admission  -Although the patient has some JVD on examination, he does not appear to be clinically fluid overloaded  -11/02/13 Echo did not show any RV dysfunction, TR, or pulm HTN  -repeated echo pending -I/O's, daily weights -low sodium diet  Pyuria -TNTC WBC in urine  -continue zosyn; will narrow to ceftin for 5 more days to complete tx for UTI -unfortunately, urine culture was not sent from ED  Acute on CKD stage III  -Baseline creatinine 1.7-2.1  -due to diuresis and presumed UTI -will hold lasix today -Cr up to 3.46 -will check BMET in am  Altered mental status -likely due to pt infection/hypoxemia -resolved now -The patient is A&Ox3  -CT brain neg for acute findings  COPD  -Cannot rule out possibility of underlying BOOP/ILD -continue bronchodilators -no wheezing presently -per PCCM will continue steroids for potential ILD  Paroxysmal Atrial fibrillation  -Rate controlled  -Presently in sinus rhythm  -Discontinue apixiban given the patient's recent history of multiple falls  -continue aspirin  -discontinue amiodarone, due to current lung issues   Diabetes mellitus type 2  -05/18/2014 hemoglobin A1c 6.5  -Give half home dose of Lantus  -NovoLog sliding scale  -will adjust coverage as needed while on steroids  Hypertension  -Continue metoprolol tartrate  -Hold amlodipine -Hold Cardura -will also hold lasix given renal failure  coronary artery disease  -Patient had CABG in 2006 -Currently chest pain-free -Continue metoprolol tartrate   Thoracic Aortic Aneursym -s/p repair -changes on CT likely  due to previous repair -continue outpatient follow up  Constipation -colace and  bisacodyl -will use enema if no improvement  Family Communication:   No family at bedside Disposition Plan:   Home when medically stable   Antibiotics: Vancomycin 06/20/14>>> Zosyn 06/20/14>>> zithromax 12/22>>   Procedures/Studies: Ct Abdomen Pelvis Wo Contrast  06/20/2014   CLINICAL DATA:  Fall in bathroom. Anti coagulation. Increased confusion.  EXAM: CT CHEST, ABDOMEN AND PELVIS WITHOUT CONTRAST  TECHNIQUE: Multidetector CT imaging of the chest, abdomen and pelvis was performed following the standard protocol without IV contrast.  COMPARISON:  Multiple exams, including 05/19/2014 and 03/26/2005  FINDINGS: CT CHEST FINDINGS  Right upper paratracheal node 1.2 cm in short axis, image 12 series 2. Subcarinal node 1.4 cm in short axis, image 34 series 2. Additional paratracheal nodes are present. If hilar nodes are enlarged there difficult to measure against the background vasculature.  Aortic and branch vessel atherosclerosis. Prior CABG. Ascending thoracic aorta 4.6 cm transverse ; descending thoracic aorta 4.6 cm transverse. Lower descending thoracic aorta 5.0 cm transverse. There is mixed density surrounding tubular calcification in the ascending thoracic aorta potentially from chronic dissection, chronic mural thrombus, or even intramural hematoma. Some of this appearance could be from an ascending aortic graft-correlate with patient history. Without IV contrast did is difficult to characterize this further.  Trace left pleural effusion. Mild enlargement of the cardiopericardial silhouette  Emphysema is present with scattered scarring and peripheral fibrosis potentially with some early honeycombing at the lung bases. In addition, there is a 2.4 by 1.4 cm nodule anteriorly in the right upper lobe, image 25 of series 5 interstitial accentuation is present bilaterally.  CT ABDOMEN AND PELVIS FINDINGS  Hepatobiliary: Unremarkable  Pancreas: Speckled calcifications throughout the pancreatic parenchyma  compatible with chronic calcific pancreatitis.  Spleen: Unremarkable  Adrenals/Urinary Tract: 8 mm left kidney lower pole nonobstructive calculus. Bilateral fluid density renal lesions favoring cysts. No hydronephrosis or hydroureter. No ureteral calculi.  Stomach/Bowel: Prominent stool throughout the colon favors constipation.  Vascular/Lymphatic: Dense aortoiliac atherosclerosis.  Reproductive: Enlarged prostate gland with scattered calcifications, measuring 5.8 by 4.6 cm.  Other: No supplemental non-categorized findings.  Musculoskeletal: Bridging fusion of the sacroiliac joints. posterolateral rod and pedicle screw fixation at L4-5 with interbody and anterior facet fusion. Intervertebral and facet spurring at L3-4 and L5-S1. Small bilateral inguinal hernias contain adipose tissue.  IMPRESSION: 1. 2.4 by 1.4 cm right upper lobe pulmonary nodule with right paratracheal adenopathy, concerning for malignancy. An atypical infectious process could potentially give this appearance. Consider nuclear medicine PET-CT. 2. Severe emphysema with peripheral fibrosis, interstitial accentuation, and some basilar honeycombing. 3. Ascending and descending thoracic aortic aneurysms measuring up to 5 cm transversely. There is a mixed density in the ascending aortic aneurysm with a complex appearance, possibly from graft material and excluded thrombus, chronic mural thrombus, chronic dissection, or intramural hematoma. Correlate with operative history. If IV contrast could not be administered, MRI might help further assess the thoracic aorta. Otherwise I recommend semi-annual imaging followup by CTA or MRA and referral to cardiothoracic surgery if not already obtained. This recommendation follows 2010 ACCF/AHA/AATS/ACR/ASA/SCA/SCAI/SIR/STS/SVM Guidelines for the Diagnosis and Management of Patients With Thoracic Aortic Disease. Circulation. 2010; 121: Z610-R604. 4. Trace left pleural effusion. 5. Chronic calcific pancreatitis. 6.   Prominent stool throughout the colon favors constipation. 7. Enlarged prostate gland. 8. Lumbar spondylosis particularly at L3-4 and L5-S1.   Electronically Signed   By: Herbie Baltimore M.D.   On: 06/20/2014 13:58  Ct Head Wo Contrast  06/20/2014   CLINICAL DATA:  Fall.  Hypoxia.  EXAM: CT HEAD WITHOUT CONTRAST  CT CERVICAL SPINE WITHOUT CONTRAST  TECHNIQUE: Multidetector CT imaging of the head and cervical spine was performed following the standard protocol without intravenous contrast. Multiplanar CT image reconstructions of the cervical spine were also generated.  COMPARISON:  Report from 11/24/2000  FINDINGS: CT HEAD FINDINGS  Hypodense lesion in the right middle cranial fossa tracking up into the sylvian fissure, fluid density, probably an arachnoid cyst or epidermoid. Size 4.0 by 3.2 by approximately 6.0 cm.  Atherosclerosis noted. The brainstem, cerebellum, cerebral peduncle sits, thalami, and basal ganglia appear unremarkable. The ventricular system appears normal and aside from the right middle cranial fossa lesion the basilar cisterns appear normal.  Periventricular white matter and corona radiata hypodensities favor chronic ischemic microvascular white matter disease. No intracranial hemorrhage, mass lesion, or acute CVA. There is a subcutaneous lesion along the occiput/upper neck in the midline on image 1 of series 3 which could represent subcutaneous bruising. Chronic left frontal sinusitis.  CT CERVICAL SPINE FINDINGS  Considerable degenerative findings at the craniocervical junction and anterior arch of C1-2 with extensive spurring of the basion and pannus formation posterior to the odontoid. Prominent loss of articular space at the anterior C1-2 articulation.  Uncinate and facet spurring cause osseous foraminal stenosis on the right at C2-3 and on the left at C3-4, C4-5, and C6-7. There is interbody fusion at the C5-6 low-level and facet fusion on the left at C4-5 and C7-T1.  There is 2 mm  degenerative anterolisthesis at C4-5 and at C7-T1. Degenerative endplate sclerosis noted with posterior osseous ridging at C6-7, C7-T1, and T1-2.  No prevertebral soft tissue swelling. No acute fracture is identified. There is dextroconvex cervical scoliosis.  Prominent emphysema and scarring noted at the lung apices.  IMPRESSION: 1. Right middle cranial fossa arachnoid cyst or epidermoid tracking into the sylvian fissure. MRI can typically differentiate between these 2 entities if clinically warranted. 2. Atherosclerosis. 3. Dense subcutaneous lesion along the occiput but and upper neck could represent some focal bruising. 4. Chronic left frontal sinusitis. 5. Cervical spondylosis causes multilevel osseous foraminal impingement. 6. Considerable spurring at the C1- 2 articulation and along the basion, with pannus formation posterior to the odontoid-rheumatoid arthropathy not excluded. 7. Emphysema.   Electronically Signed   By: Herbie Baltimore M.D.   On: 06/20/2014 13:36   Ct Chest Wo Contrast  06/20/2014   CLINICAL DATA:  Fall in bathroom. Anti coagulation. Increased confusion.  EXAM: CT CHEST, ABDOMEN AND PELVIS WITHOUT CONTRAST  TECHNIQUE: Multidetector CT imaging of the chest, abdomen and pelvis was performed following the standard protocol without IV contrast.  COMPARISON:  Multiple exams, including 05/19/2014 and 03/26/2005  FINDINGS: CT CHEST FINDINGS  Right upper paratracheal node 1.2 cm in short axis, image 12 series 2. Subcarinal node 1.4 cm in short axis, image 34 series 2. Additional paratracheal nodes are present. If hilar nodes are enlarged there difficult to measure against the background vasculature.  Aortic and branch vessel atherosclerosis. Prior CABG. Ascending thoracic aorta 4.6 cm transverse ; descending thoracic aorta 4.6 cm transverse. Lower descending thoracic aorta 5.0 cm transverse. There is mixed density surrounding tubular calcification in the ascending thoracic aorta potentially  from chronic dissection, chronic mural thrombus, or even intramural hematoma. Some of this appearance could be from an ascending aortic graft-correlate with patient history. Without IV contrast did is difficult to characterize this further.  Trace left  pleural effusion. Mild enlargement of the cardiopericardial silhouette  Emphysema is present with scattered scarring and peripheral fibrosis potentially with some early honeycombing at the lung bases. In addition, there is a 2.4 by 1.4 cm nodule anteriorly in the right upper lobe, image 25 of series 5 interstitial accentuation is present bilaterally.  CT ABDOMEN AND PELVIS FINDINGS  Hepatobiliary: Unremarkable  Pancreas: Speckled calcifications throughout the pancreatic parenchyma compatible with chronic calcific pancreatitis.  Spleen: Unremarkable  Adrenals/Urinary Tract: 8 mm left kidney lower pole nonobstructive calculus. Bilateral fluid density renal lesions favoring cysts. No hydronephrosis or hydroureter. No ureteral calculi.  Stomach/Bowel: Prominent stool throughout the colon favors constipation.  Vascular/Lymphatic: Dense aortoiliac atherosclerosis.  Reproductive: Enlarged prostate gland with scattered calcifications, measuring 5.8 by 4.6 cm.  Other: No supplemental non-categorized findings.  Musculoskeletal: Bridging fusion of the sacroiliac joints. posterolateral rod and pedicle screw fixation at L4-5 with interbody and anterior facet fusion. Intervertebral and facet spurring at L3-4 and L5-S1. Small bilateral inguinal hernias contain adipose tissue.  IMPRESSION: 1. 2.4 by 1.4 cm right upper lobe pulmonary nodule with right paratracheal adenopathy, concerning for malignancy. An atypical infectious process could potentially give this appearance. Consider nuclear medicine PET-CT. 2. Severe emphysema with peripheral fibrosis, interstitial accentuation, and some basilar honeycombing. 3. Ascending and descending thoracic aortic aneurysms measuring up to 5 cm  transversely. There is a mixed density in the ascending aortic aneurysm with a complex appearance, possibly from graft material and excluded thrombus, chronic mural thrombus, chronic dissection, or intramural hematoma. Correlate with operative history. If IV contrast could not be administered, MRI might help further assess the thoracic aorta. Otherwise I recommend semi-annual imaging followup by CTA or MRA and referral to cardiothoracic surgery if not already obtained. This recommendation follows 2010 ACCF/AHA/AATS/ACR/ASA/SCA/SCAI/SIR/STS/SVM Guidelines for the Diagnosis and Management of Patients With Thoracic Aortic Disease. Circulation. 2010; 121: E454-U981. 4. Trace left pleural effusion. 5. Chronic calcific pancreatitis. 6.  Prominent stool throughout the colon favors constipation. 7. Enlarged prostate gland. 8. Lumbar spondylosis particularly at L3-4 and L5-S1.   Electronically Signed   By: Herbie Baltimore M.D.   On: 06/20/2014 13:58   Ct Cervical Spine Wo Contrast  06/20/2014   CLINICAL DATA:  Fall.  Hypoxia.  EXAM: CT HEAD WITHOUT CONTRAST  CT CERVICAL SPINE WITHOUT CONTRAST  TECHNIQUE: Multidetector CT imaging of the head and cervical spine was performed following the standard protocol without intravenous contrast. Multiplanar CT image reconstructions of the cervical spine were also generated.  COMPARISON:  Report from 11/24/2000  FINDINGS: CT HEAD FINDINGS  Hypodense lesion in the right middle cranial fossa tracking up into the sylvian fissure, fluid density, probably an arachnoid cyst or epidermoid. Size 4.0 by 3.2 by approximately 6.0 cm.  Atherosclerosis noted. The brainstem, cerebellum, cerebral peduncle sits, thalami, and basal ganglia appear unremarkable. The ventricular system appears normal and aside from the right middle cranial fossa lesion the basilar cisterns appear normal.  Periventricular white matter and corona radiata hypodensities favor chronic ischemic microvascular white matter  disease. No intracranial hemorrhage, mass lesion, or acute CVA. There is a subcutaneous lesion along the occiput/upper neck in the midline on image 1 of series 3 which could represent subcutaneous bruising. Chronic left frontal sinusitis.  CT CERVICAL SPINE FINDINGS  Considerable degenerative findings at the craniocervical junction and anterior arch of C1-2 with extensive spurring of the basion and pannus formation posterior to the odontoid. Prominent loss of articular space at the anterior C1-2 articulation.  Uncinate and facet spurring cause osseous  foraminal stenosis on the right at C2-3 and on the left at C3-4, C4-5, and C6-7. There is interbody fusion at the C5-6 low-level and facet fusion on the left at C4-5 and C7-T1.  There is 2 mm degenerative anterolisthesis at C4-5 and at C7-T1. Degenerative endplate sclerosis noted with posterior osseous ridging at C6-7, C7-T1, and T1-2.  No prevertebral soft tissue swelling. No acute fracture is identified. There is dextroconvex cervical scoliosis.  Prominent emphysema and scarring noted at the lung apices.  IMPRESSION: 1. Right middle cranial fossa arachnoid cyst or epidermoid tracking into the sylvian fissure. MRI can typically differentiate between these 2 entities if clinically warranted. 2. Atherosclerosis. 3. Dense subcutaneous lesion along the occiput but and upper neck could represent some focal bruising. 4. Chronic left frontal sinusitis. 5. Cervical spondylosis causes multilevel osseous foraminal impingement. 6. Considerable spurring at the C1- 2 articulation and along the basion, with pannus formation posterior to the odontoid-rheumatoid arthropathy not excluded. 7. Emphysema.   Electronically Signed   By: Herbie Baltimore M.D.   On: 06/20/2014 13:36   Dg Chest Port 1 View  06/23/2014   CLINICAL DATA:  Acute respiratory failure, hypoxia  EXAM: PORTABLE CHEST - 1 VIEW  COMPARISON:  06/20/2014  FINDINGS: Cardiomediastinal silhouette is stable. Again noted  reticular diffuse interstitial prominence bilaterally probable due to chronic interstitial lung disease and fibrotic changes. No definite superimposed infiltrate or pulmonary edema. Poorly visualized nodule in right upper lobe again noted measures about 2 cm. This was better visualized on recent CT scan.  IMPRESSION: Again noted diffuse reticular interstitial prominence probable due to chronic interstitial lung disease and fibrotic changes. No definite superimposed infiltrate. Again noted nodule in right upper lobe medially. This was better visualized on recent CT scan.   Electronically Signed   By: Natasha Mead M.D.   On: 06/23/2014 11:01   Dg Chest Portable 1 View  06/20/2014   CLINICAL DATA:  Fall.  Pneumonia.  Shortness of breath.  Confusion.  EXAM: PORTABLE CHEST - 1 VIEW  COMPARISON:  05/19/2014  FINDINGS: Diffuse interstitial and patchy airspace opacities are present superimposed on emphysema. Atherosclerotic aortic arch and prior CABG. Heart size within normal limits for projection. Stable biapical pleural parenchymal scarring. No pneumothorax observed. No definite blunting of the costophrenic angles.  IMPRESSION: 1. Bilateral interstitial and airspace opacities, worsened, superimposed on severe emphysema. Appearance could reflect edema or atypical pneumonia.   Electronically Signed   By: Herbie Baltimore M.D.   On: 06/20/2014 12:09    Subjective: Patient is feeling subjectively better; O2 down to [place] 5-6L; patient wheezing more today.  Objective: Filed Vitals:   06/24/14 0338 06/24/14 0508 06/24/14 0739 06/24/14 1402  BP:  140/51  158/51  Pulse:  62  58  Temp:  97.1 F (36.2 C)  98.4 F (36.9 C)  TempSrc:  Oral  Oral  Resp:  18  20  Height:      Weight:  78.5 kg (173 lb 1 oz)    SpO2: 88% 95% 93% 92%    Intake/Output Summary (Last 24 hours) at 06/24/14 1429 Last data filed at 06/24/14 1300  Gross per 24 hour  Intake   2025 ml  Output   1676 ml  Net    349 ml   Weight change: 6.2  kg (13 lb 10.7 oz) Exam:   General:  Pt is alert, follows commands appropriately, breathing ok; on [place] 5-6L wheezing more today.  HEENT: No icterus, No thrush, [place]/AT  Cardiovascular:  RRR, S1/S2, no rubs, no gallops, +JVD; positive SEM  Respiratory: no crackles, positive wheezing; somewhat improved air movement   Abdomen: Soft/+BS, non tender, non distended, no guarding  Extremities: No edema, No lymphangitis, No petechiae, No rashes, no synovitis  Data Reviewed: Basic Metabolic Panel:  Recent Labs Lab 06/20/14 1201 06/21/14 0455 06/22/14 0515 06/23/14 0420  NA 145 142 137 140  K 4.5 4.1 4.1 4.5  CL 108 109 107 110  CO2 19 24 23 23   GLUCOSE 208* 136* 175* 87  BUN 41* 47* 59* 66*  CREATININE 2.09* 2.49* 3.22* 3.46*  CALCIUM 9.4 8.4 8.0* 8.5   Liver Function Tests:  Recent Labs Lab 06/20/14 1201  AST 26  ALT 15  ALKPHOS 85  BILITOT 1.0  PROT 8.2  ALBUMIN 3.3*   CBC:  Recent Labs Lab 06/20/14 1201 06/21/14 0455 06/22/14 0515  WBC 11.8* 10.5 9.1  NEUTROABS 9.9*  --   --   HGB 10.0* 9.0* 8.4*  HCT 32.7* 29.4* 28.2*  MCV 94.2 94.5 96.2  PLT 211 197 210   Cardiac Enzymes:  Recent Labs Lab 06/20/14 1201  TROPONINI <0.30   BNP: Invalid input(s): POCBNP CBG:  Recent Labs Lab 06/23/14 1206 06/23/14 1656 06/23/14 2032 06/24/14 0748 06/24/14 1155  GLUCAP 455* 497* 466* 426* 359*    Recent Results (from the past 240 hour(s))  Culture, blood (routine x 2)     Status: None (Preliminary result)   Collection Time: 06/20/14  5:42 PM  Result Value Ref Range Status   Specimen Description BLOOD RIGHT ARM  Final   Special Requests BOTTLES DRAWN AEROBIC AND ANAEROBIC 10CC  Final   Culture  Setup Time   Final    06/21/2014 02:53 Performed at [facility]    Culture   Final           BLOOD CULTURE RECEIVED NO GROWTH TO DATE CULTURE WILL BE HELD FOR 5 DAYS BEFORE ISSUING A FINAL NEGATIVE REPORT Performed at [facility]    Report  Status PENDING  Incomplete  Culture, blood (routine x 2)     Status: None (Preliminary result)   Collection Time: 06/20/14  5:50 PM  Result Value Ref Range Status   Specimen Description BLOOD LEFT HAND  Final   Special Requests BOTTLES DRAWN AEROBIC ONLY 5CC  Final   Culture  Setup Time   Final    06/21/2014 02:56 Performed at [facility]    Culture   Final           BLOOD CULTURE RECEIVED NO GROWTH TO DATE CULTURE WILL BE HELD FOR 5 DAYS BEFORE ISSUING A FINAL NEGATIVE REPORT Performed at [facility]    Report Status PENDING  Incomplete     Scheduled Meds: . aspirin EC  81 mg Oral Q breakfast  . bisacodyl  10 mg Rectal Once  . cefUROXime  250 mg Oral BID WC  . docusate sodium  100 mg Oral BID  . ezetimibe  10 mg Oral Q breakfast  . ferrous sulfate  325 mg Oral Q breakfast  . gabapentin  300 mg Oral Q24H  . gabapentin  600 mg Oral BID  . heparin  5,000 Units Subcutaneous 3 times per day  . insulin aspart  0-15 Units Subcutaneous TID WC  . insulin glargine  20 Units Subcutaneous QHS  . ipratropium-albuterol  3 mL Nebulization TID  . metoprolol  25 mg Oral BID  . omega-3 acid ethyl esters  1  g Oral BID  . oxyCODONE-acetaminophen  1 tablet Oral Q24H  . oxyCODONE-acetaminophen  2 tablet Oral BID  . predniSONE  40 mg Oral Q breakfast  . sodium chloride  3 mL Intravenous Q12H   Continuous Infusions: . sodium chloride 75 mL/hr at 06/23/14 2237     Marcha DuttonMadera, Hadi Dubin,MD  [facility] Pager [phone]  If 7PM-7AM, please contact night-coverage [email or web address] Password [email or web address] 06/24/2014, 2:29 PM   LOS: 4 days

## 2014-06-24 NOTE — Progress Notes (Signed)
Text paged Md about having to put pt on 100% nonrebreather because cont to desat without it,

## 2014-06-24 NOTE — Progress Notes (Signed)
PULMONARY / CRITICAL CARE MEDICINE   Name: [name] [name] A Watkin MRN: [id number] DOB: 08/04/1928    ADMISSION DATE:  06/20/2014 CONSULTATION DATE:  06/21/2014  REFERRING MD :   Tat  CHIEF COMPLAINT: Shortness of breath  INITIAL PRESENTATION: 80 year old male with no prior pulmonary history was admitted on 06/20/2014 with acute hypoxemic respiratory failure after an upper respiratory infection syndrome vs amiodarone toxicity (d/c'd on 06/21/14 )   STUDIES:  12/21 CT chest >> there is notable emphysema in the lungs bilaterally with honeycombing in the bases and interstitial thickening, overall picture worrisome for interstitial lung disease superimposed on a background of emphysema. See radiology report for other findings including aorta, status post grafting. 12/22  ECHO >> EF 50-55%, mod LVH, PA Peak 41, dilated descending thoracic aorta 4.4 cm   SIGNIFICANT EVENTS: 12/22  PCCM consulted for hypoxemic respiratory failure 12/23  Remains on 15L O2, started solumedrol; ILD serology labs negative 12/24  O2 requirements down to 5L   SUBJECTIVE:  Comfortable at rest 5lpm but baseline = walk with a walker x 100 ft,  No 02, last did this 2 m prior to admit     VITAL SIGNS: Temp:  [97.1 F (36.2 C)-97.5 F (36.4 C)] 97.1 F (36.2 C) (12/25 0508) Pulse Rate:  [62-64] 62 (12/25 0508) Resp:  [16-18] 18 (12/25 0508) BP: (118-145)/(47-51) 140/51 mmHg (12/25 0508) SpO2:  [88 %-96 %] 93 % (12/25 0739) FiO2 (%):  [55 %] 55 % (12/25 0508) Weight:  [173 lb 1 oz (78.5 kg)] 173 lb 1 oz (78.5 kg) (12/25 0508)  VENTILATOR SETTINGS: Vent Mode:  [-]  FiO2 (%):  [55 %] 55 %   INTAKE / OUTPUT:  Intake/Output Summary (Last 24 hours) at 06/24/14 0927 Last data filed at 06/24/14 0700  Gross per 24 hour  Intake   1665 ml  Output   1575 ml  Net     90 ml    PHYSICAL EXAMINATION: General:  Comfortable in bed Neuro: A&OX4 HEENT: NCAT, OP Clear PULM: Crackles bilaterally on insp/ no wheeze  CV:  RRR, no mgr Ab: BS+, soft Ext; warm, no edema   LABS:  CBC  Recent Labs Lab 06/20/14 1201 06/21/14 0455 06/22/14 0515  WBC 11.8* 10.5 9.1  HGB 10.0* 9.0* 8.4*  HCT 32.7* 29.4* 28.2*  PLT 211 197 210   Coag's  Recent Labs Lab 06/20/14 1201  INR 1.41   BMET  Recent Labs Lab 06/21/14 0455 06/22/14 0515 06/23/14 0420  NA 142 137 140  K 4.1 4.1 4.5  CL 109 107 110  CO2 24 23 23   BUN 47* 59* 66*  CREATININE 2.49* 3.22* 3.46*  GLUCOSE 136* 175* 87   Electrolytes  Recent Labs Lab 06/21/14 0455 06/22/14 0515 06/23/14 0420  CALCIUM 8.4 8.0* 8.5   Sepsis Markers  Recent Labs Lab 06/20/14 1207 06/20/14 1742 06/22/14 0515 06/24/14 0416  LATICACIDVEN 2.26*  --   --   --   PROCALCITON  --  0.67 1.33 0.53   ABG  Recent Labs Lab 06/20/14 1301  PHART 7.409  PCO2ART 34.0*  PO2ART 84.6   Liver Enzymes  Recent Labs Lab 06/20/14 1201  AST 26  ALT 15  ALKPHOS 85  BILITOT 1.0  ALBUMIN 3.3*   Cardiac Enzymes  Recent Labs Lab 06/20/14 1201 06/20/14 1202  TROPONINI <0.30  --   PROBNP  --  14250.0*   Glucose  Recent Labs Lab 06/23/14 0818 06/23/14 1204 06/23/14 1206 06/23/14 1656 06/23/14 2032 06/24/14  0748  GLUCAP 124* 478* 455* 497* 466* 426*    Imaging    ASSESSMENT / PLAN:  PULMONARY A:  Acute hypoxemic respiratory failure: See previous discussion for DDX, Given improvement with steroids and holding amiodarone (and lack of response to antibiotics and diuretics), c/w amiodarone pulmonary toxicity  Severe underlying emphysema, not in exacerbation PAH - pa peak 41 on ECHO P:   -continue solumedrol 12/24, start prednisone 12/25, would d/c home on 30mg  prednisone until follow up in pulmonary clinic -continue O2 as needed for saturations > 88% -hold amiodarone for life -will need O2 eval prior to discharge -will make arrangements for hospital follow up with McQuaid and or Tammy Parrett in pulmonary clinic with a  CXR  CARDIOVASCULAR A:  Acute diastolic heart failure? Has elevated JVD, proBNP Afib  P:  -hold further diuresis with rise of serum cr  -may need cardiology to see him after stopping amiodarone 06/21/14  (follows with Dr. Tresa EndoKelly), would recommend rate control for now  INFECTIOUS U. Legionella 12/22 > neg  RVF 12/22 >>  BCx2 12/21 >>  A:  Don't think he has HCAP P: No role for abx      Discussed with wife trying to get him back to where he was in terms of activity tol 2 m pta = walking with walker x 100 ft ultimate goal but not clear at this point how much of the amiodarone effect is reversible, esp with honeycombing on ct   Sandrea HughsMichael Oneil Behney, MD Pulmonary and Critical Care Medicine [facility] Cell [phone] After 5:30 PM or weekends, call [phone]

## 2014-06-25 ENCOUNTER — Inpatient Hospital Stay (HOSPITAL_COMMUNITY): Payer: Medicare Other

## 2014-06-25 DIAGNOSIS — R9389 Abnormal findings on diagnostic imaging of other specified body structures: Secondary | ICD-10-CM | POA: Insufficient documentation

## 2014-06-25 DIAGNOSIS — R938 Abnormal findings on diagnostic imaging of other specified body structures: Secondary | ICD-10-CM

## 2014-06-25 DIAGNOSIS — J441 Chronic obstructive pulmonary disease with (acute) exacerbation: Secondary | ICD-10-CM

## 2014-06-25 LAB — GLUCOSE, CAPILLARY
GLUCOSE-CAPILLARY: 242 mg/dL — AB (ref 70–99)
GLUCOSE-CAPILLARY: 292 mg/dL — AB (ref 70–99)
Glucose-Capillary: 317 mg/dL — ABNORMAL HIGH (ref 70–99)
Glucose-Capillary: 231 mg/dL — ABNORMAL HIGH (ref 70–99)

## 2014-06-25 LAB — BASIC METABOLIC PANEL
Anion gap: 5 (ref 5–15)
BUN: 76 mg/dL — AB (ref 6–23)
CHLORIDE: 112 meq/L (ref 96–112)
CO2: 21 mmol/L (ref 19–32)
CREATININE: 2.35 mg/dL — AB (ref 0.50–1.35)
Calcium: 8.4 mg/dL (ref 8.4–10.5)
GFR calc Af Amer: 28 mL/min — ABNORMAL LOW (ref >90)
GFR calc non Af Amer: 24 mL/min — ABNORMAL LOW (ref >90)
GLUCOSE: 383 mg/dL — AB (ref 70–99)
Potassium: 4.7 mmol/L (ref 3.5–5.1)
Sodium: 138 mmol/L (ref 135–145)

## 2014-06-25 LAB — CBC
HCT: 25.7 % — ABNORMAL LOW (ref 39.0–52.0)
HEMOGLOBIN: 7.9 g/dL — AB (ref 13.0–17.0)
MCH: 28.5 pg (ref 26.0–34.0)
MCHC: 30.7 g/dL (ref 30.0–36.0)
MCV: 92.8 fL (ref 78.0–100.0)
Platelets: 220 10*3/uL (ref 150–400)
RBC: 2.77 MIL/uL — ABNORMAL LOW (ref 4.22–5.81)
RDW: 16.7 % — ABNORMAL HIGH (ref 11.5–15.5)
WBC: 9.9 10*3/uL (ref 4.0–10.5)

## 2014-06-25 MED ORDER — METHYLPREDNISOLONE SODIUM SUCC 125 MG IJ SOLR
80.0000 mg | Freq: Two times a day (BID) | INTRAMUSCULAR | Status: DC
  Administered 2014-06-25 – 2014-06-29 (×8): 80 mg via INTRAVENOUS
  Filled 2014-06-25: qty 2
  Filled 2014-06-25: qty 1.28
  Filled 2014-06-25 (×3): qty 2
  Filled 2014-06-25 (×2): qty 1.28
  Filled 2014-06-25: qty 2
  Filled 2014-06-25: qty 1.28
  Filled 2014-06-25: qty 2

## 2014-06-25 NOTE — Progress Notes (Signed)
[facility] hospitalist progress note. Chief complaint. Dyspnea, desats. History of present illness. This 80 year old male in hospital with multi factorial acute respiratory failure. Being treated empirically with antibiotics and bronchodilators. Respiratory therapy noted the patient having desats into the 80s and increase work of breathing with use of accessory respiratory muscles. I came up to see the patient at bedside and found him alert on 100% facemask oxygen with O2 sats in the upper 80s.. The mildly/moderately dyspneic with use of secondary respiratory muscles. Unable to talk in anything but short sentences. Physical exam. Vital signs. Temperature 97.5, pulse 55, respiration 25, blood pressure 168/52. O2 sats 88% on facemask 100% oxygen. General appearance. Frail elderly male who is alert and with mild/moderate dyspnea. Cardiac. Rate and rhythm regular. 2/6 systolic ejection murmur. No jugular venous distention. Lungs. Crackles appreciated bilaterally. Mild/moderate increase work of breathing with use of accessory muscles. Impression/plan. Problem #1. Dyspnea and hypoxia. I suspect with the increase work of breathing that the patient may become fatigued and decompensated. I will obtain a stat portable chest x-ray to further evaluate. Will move to step down for closer monitoring and initiated BiPAP therapy per respiratory therapy. Will obtain an arterial blood gas in approximately 3 hours. O2 sats gas exchange.

## 2014-06-25 NOTE — Progress Notes (Signed)
[name] [name]. Callahan paged. Patient sats mid to low 80's with 100% NRB. Patient with labored breathing. [name] [name]. Callahan to assess patient for before further instruction.

## 2014-06-25 NOTE — Progress Notes (Signed)
PROGRESS NOTE  [name] [name] EAV:[id number] DOB: 11/30/1928 DOA: 06/20/2014 PCP: [name],[name], MD  Brief history 80 year old male with a history of COPD, paroxysmal A. fib, CKD stage III, diabetes mellitus, hypertension, thoracic aortic aneurysm repair presents with 3 day history of worsening shortness of breath. Notably, the patient was recently admitted to the hospital from November 18 through 05/21/2014 for pneumonia. The patient was discharged home with levofloxacin. The patient presented to his primary care physician's office on the day of admission when he was noted to have oxygen saturation of 80%. He was sent to the emergency department. In the emergency department, the patient was noted to have oxygen saturation of 63% on room air. CT of the chest showed "mixed density surrounding tubular calcification in the ascending thoracic aorta potentially from chronic dissection, chronic muralthrombus, or even intramural hematoma. Some of this appearance could be from an ascending aortic graft". CT of the abdomen and pelvis was negative for any acute findings, but it does show prominent stool.   Assessment/Plan: Acute respiratory failure -Likely multifactorial with the possibility of pulmonary edema, pneumonia (HCAP), and consideration for underlying ILD or BOOP -The patient is stable back on NRB mask -Treat with steroids as recommended by PCCM; transitioned to PO prednisone -The patient did not have any significant clinical improvement with intravenous furosemide x 4 doses; and given increase in Cr -no more lasix for now -will follow rec's per Dr. Blaine HamperMcQuaid/Dr. Sherene SiresWert -start flutter valve  Pulmonary opacities/infiltrates -No significant clinical improvement with intravenous furosemide 4 doses -will follow pulmonary rec's -since no clear signs of lung infection will narrow antibiotics to cover only UTI -Blood cultures 2 sets--Unfortunately, this was not initially ordered, and  Zosyn was given prior to collection of the blood cultures -check procalcitonin-->0.67 -Continue bronchodilators -lasix now on hold due to acute component of renal failure  Elevated proBNP  -Although the patient has CKD stage III which may elevate BNP, this is more elevated than his prior admission  -Although the patient has some JVD on examination, he does not appear to be clinically fluid overloaded  -11/02/13 Echo did not show any RV dysfunction, TR, or pulm HTN  -repeated echo pending -I/O's, daily weights -low sodium diet  Pyuria -TNTC WBC in urine  -continue zosyn; will narrow to ceftin for 5 more days to complete tx for UTI -unfortunately, urine culture was not sent from ED  Acute on CKD stage III  -Baseline creatinine 1.7-2.1  -due to diuresis and presumed UTI -Cr up to 2.36 -will follow renal function trend -holding lasix -[facility]  Altered mental status -likely due to infection/hypoxemia -resolved now -The patient is now A&Ox3  -CT brain neg for acute findings  COPD  -Cannot rule out possibility of underlying BOOP/ILD -continue bronchodilators -no wheezing presently -per PCCM will continue steroids for potential ILD  Paroxysmal Atrial fibrillation  -Rate controlled  -Presently in sinus rhythm  -Discontinue apixiban given the patient's recent history of multiple falls  -continue aspirin  -discontinue amiodarone, due to current lung issues   Diabetes mellitus type 2  -05/18/2014 hemoglobin A1c 6.5  -Give half home dose of Lantus  -NovoLog sliding scale  -will adjust coverage as needed while on steroids  Hypertension  -Continue metoprolol tartrate  -Hold amlodipine -Hold Cardura -will continue holding lasix given recent worsening on renal function  coronary artery disease  -Patient had CABG in 2006 -Currently chest pain-free -Continue metoprolol tartrate  Thoracic Aortic Aneursym -s/p repair -changes on CT likely due  to previous repair -continue outpatient follow up  Constipation -colace and bisacodyl -will use enema if no improvement  Family Communication:   No family at bedside Disposition Plan:   Home when medically stable   Antibiotics: Vancomycin 06/20/14>>> Zosyn 06/20/14>>> zithromax 12/22>>   Procedures/Studies: Ct Abdomen Pelvis Wo Contrast  06/20/2014   CLINICAL DATA:  Fall in bathroom. Anti coagulation. Increased confusion.  EXAM: CT CHEST, ABDOMEN AND PELVIS WITHOUT CONTRAST  TECHNIQUE: Multidetector CT imaging of the chest, abdomen and pelvis was performed following the standard protocol without IV contrast.  COMPARISON:  Multiple exams, including 05/19/2014 and 03/26/2005  FINDINGS: CT CHEST FINDINGS  Right upper paratracheal node 1.2 cm in short axis, image 12 series 2. Subcarinal node 1.4 cm in short axis, image 34 series 2. Additional paratracheal nodes are present. If hilar nodes are enlarged there difficult to measure against the background vasculature.  Aortic and branch vessel atherosclerosis. Prior CABG. Ascending thoracic aorta 4.6 cm transverse ; descending thoracic aorta 4.6 cm transverse. Lower descending thoracic aorta 5.0 cm transverse. There is mixed density surrounding tubular calcification in the ascending thoracic aorta potentially from chronic dissection, chronic mural thrombus, or even intramural hematoma. Some of this appearance could be from an ascending aortic graft-correlate with patient history. Without IV contrast did is difficult to characterize this further.  Trace left pleural effusion. Mild enlargement of the cardiopericardial silhouette  Emphysema is present with scattered scarring and peripheral fibrosis potentially with some early honeycombing at the lung bases. In addition, there is a 2.4 by 1.4 cm nodule anteriorly in the right upper lobe, image 25 of series 5 interstitial accentuation is present bilaterally.  CT ABDOMEN AND PELVIS FINDINGS  Hepatobiliary:  Unremarkable  Pancreas: Speckled calcifications throughout the pancreatic parenchyma compatible with chronic calcific pancreatitis.  Spleen: Unremarkable  Adrenals/Urinary Tract: 8 mm left kidney lower pole nonobstructive calculus. Bilateral fluid density renal lesions favoring cysts. No hydronephrosis or hydroureter. No ureteral calculi.  Stomach/Bowel: Prominent stool throughout the colon favors constipation.  Vascular/Lymphatic: Dense aortoiliac atherosclerosis.  Reproductive: Enlarged prostate gland with scattered calcifications, measuring 5.8 by 4.6 cm.  Other: No supplemental non-categorized findings.  Musculoskeletal: Bridging fusion of the sacroiliac joints. posterolateral rod and pedicle screw fixation at L4-5 with interbody and anterior facet fusion. Intervertebral and facet spurring at L3-4 and L5-S1. Small bilateral inguinal hernias contain adipose tissue.  IMPRESSION: 1. 2.4 by 1.4 cm right upper lobe pulmonary nodule with right paratracheal adenopathy, concerning for malignancy. An atypical infectious process could potentially give this appearance. Consider nuclear medicine PET-CT. 2. Severe emphysema with peripheral fibrosis, interstitial accentuation, and some basilar honeycombing. 3. Ascending and descending thoracic aortic aneurysms measuring up to 5 cm transversely. There is a mixed density in the ascending aortic aneurysm with a complex appearance, possibly from graft material and excluded thrombus, chronic mural thrombus, chronic dissection, or intramural hematoma. Correlate with operative history. If IV contrast could not be administered, MRI might help further assess the thoracic aorta. Otherwise I recommend semi-annual imaging followup by CTA or MRA and referral to cardiothoracic surgery if not already obtained. This recommendation follows 2010 ACCF/AHA/AATS/ACR/ASA/SCA/SCAI/SIR/STS/SVM Guidelines for the Diagnosis and Management of Patients With Thoracic Aortic Disease. Circulation. 2010;  121: N562-Z308. 4. Trace left pleural effusion. 5. Chronic calcific pancreatitis. 6.  Prominent stool throughout the colon favors constipation. 7. Enlarged prostate gland. 8. Lumbar spondylosis particularly at L3-4 and L5-S1.   Electronically Signed   By: Lorelle Gibbs  Ova FreshwaterLiebkemann M.D.   On: 06/20/2014 13:58   Ct Head Wo Contrast  06/20/2014   CLINICAL DATA:  Fall.  Hypoxia.  EXAM: CT HEAD WITHOUT CONTRAST  CT CERVICAL SPINE WITHOUT CONTRAST  TECHNIQUE: Multidetector CT imaging of the head and cervical spine was performed following the standard protocol without intravenous contrast. Multiplanar CT image reconstructions of the cervical spine were also generated.  COMPARISON:  Report from 11/24/2000  FINDINGS: CT HEAD FINDINGS  Hypodense lesion in the right middle cranial fossa tracking up into the sylvian fissure, fluid density, probably an arachnoid cyst or epidermoid. Size 4.0 by 3.2 by approximately 6.0 cm.  Atherosclerosis noted. The brainstem, cerebellum, cerebral peduncle sits, thalami, and basal ganglia appear unremarkable. The ventricular system appears normal and aside from the right middle cranial fossa lesion the basilar cisterns appear normal.  Periventricular white matter and corona radiata hypodensities favor chronic ischemic microvascular white matter disease. No intracranial hemorrhage, mass lesion, or acute CVA. There is a subcutaneous lesion along the occiput/upper neck in the midline on image 1 of series 3 which could represent subcutaneous bruising. Chronic left frontal sinusitis.  CT CERVICAL SPINE FINDINGS  Considerable degenerative findings at the craniocervical junction and anterior arch of C1-2 with extensive spurring of the basion and pannus formation posterior to the odontoid. Prominent loss of articular space at the anterior C1-2 articulation.  Uncinate and facet spurring cause osseous foraminal stenosis on the right at C2-3 and on the left at C3-4, C4-5, and C6-7. There is interbody fusion at  the C5-6 low-level and facet fusion on the left at C4-5 and C7-T1.  There is 2 mm degenerative anterolisthesis at C4-5 and at C7-T1. Degenerative endplate sclerosis noted with posterior osseous ridging at C6-7, C7-T1, and T1-2.  No prevertebral soft tissue swelling. No acute fracture is identified. There is dextroconvex cervical scoliosis.  Prominent emphysema and scarring noted at the lung apices.  IMPRESSION: 1. Right middle cranial fossa arachnoid cyst or epidermoid tracking into the sylvian fissure. MRI can typically differentiate between these 2 entities if clinically warranted. 2. Atherosclerosis. 3. Dense subcutaneous lesion along the occiput but and upper neck could represent some focal bruising. 4. Chronic left frontal sinusitis. 5. Cervical spondylosis causes multilevel osseous foraminal impingement. 6. Considerable spurring at the C1- 2 articulation and along the basion, with pannus formation posterior to the odontoid-rheumatoid arthropathy not excluded. 7. Emphysema.   Electronically Signed   By: Herbie BaltimoreWalt  Liebkemann M.D.   On: 06/20/2014 13:36   Ct Chest Wo Contrast  06/20/2014   CLINICAL DATA:  Fall in bathroom. Anti coagulation. Increased confusion.  EXAM: CT CHEST, ABDOMEN AND PELVIS WITHOUT CONTRAST  TECHNIQUE: Multidetector CT imaging of the chest, abdomen and pelvis was performed following the standard protocol without IV contrast.  COMPARISON:  Multiple exams, including 05/19/2014 and 03/26/2005  FINDINGS: CT CHEST FINDINGS  Right upper paratracheal node 1.2 cm in short axis, image 12 series 2. Subcarinal node 1.4 cm in short axis, image 34 series 2. Additional paratracheal nodes are present. If hilar nodes are enlarged there difficult to measure against the background vasculature.  Aortic and branch vessel atherosclerosis. Prior CABG. Ascending thoracic aorta 4.6 cm transverse ; descending thoracic aorta 4.6 cm transverse. Lower descending thoracic aorta 5.0 cm transverse. There is mixed density  surrounding tubular calcification in the ascending thoracic aorta potentially from chronic dissection, chronic mural thrombus, or even intramural hematoma. Some of this appearance could be from an ascending aortic graft-correlate with patient history. Without IV contrast did  is difficult to characterize this further.  Trace left pleural effusion. Mild enlargement of the cardiopericardial silhouette  Emphysema is present with scattered scarring and peripheral fibrosis potentially with some early honeycombing at the lung bases. In addition, there is a 2.4 by 1.4 cm nodule anteriorly in the right upper lobe, image 25 of series 5 interstitial accentuation is present bilaterally.  CT ABDOMEN AND PELVIS FINDINGS  Hepatobiliary: Unremarkable  Pancreas: Speckled calcifications throughout the pancreatic parenchyma compatible with chronic calcific pancreatitis.  Spleen: Unremarkable  Adrenals/Urinary Tract: 8 mm left kidney lower pole nonobstructive calculus. Bilateral fluid density renal lesions favoring cysts. No hydronephrosis or hydroureter. No ureteral calculi.  Stomach/Bowel: Prominent stool throughout the colon favors constipation.  Vascular/Lymphatic: Dense aortoiliac atherosclerosis.  Reproductive: Enlarged prostate gland with scattered calcifications, measuring 5.8 by 4.6 cm.  Other: No supplemental non-categorized findings.  Musculoskeletal: Bridging fusion of the sacroiliac joints. posterolateral rod and pedicle screw fixation at L4-5 with interbody and anterior facet fusion. Intervertebral and facet spurring at L3-4 and L5-S1. Small bilateral inguinal hernias contain adipose tissue.  IMPRESSION: 1. 2.4 by 1.4 cm right upper lobe pulmonary nodule with right paratracheal adenopathy, concerning for malignancy. An atypical infectious process could potentially give this appearance. Consider nuclear medicine PET-CT. 2. Severe emphysema with peripheral fibrosis, interstitial accentuation, and some basilar honeycombing.  3. Ascending and descending thoracic aortic aneurysms measuring up to 5 cm transversely. There is a mixed density in the ascending aortic aneurysm with a complex appearance, possibly from graft material and excluded thrombus, chronic mural thrombus, chronic dissection, or intramural hematoma. Correlate with operative history. If IV contrast could not be administered, MRI might help further assess the thoracic aorta. Otherwise I recommend semi-annual imaging followup by CTA or MRA and referral to cardiothoracic surgery if not already obtained. This recommendation follows 2010 ACCF/AHA/AATS/ACR/ASA/SCA/SCAI/SIR/STS/SVM Guidelines for the Diagnosis and Management of Patients With Thoracic Aortic Disease. Circulation. 2010; 121: Z610-R604. 4. Trace left pleural effusion. 5. Chronic calcific pancreatitis. 6.  Prominent stool throughout the colon favors constipation. 7. Enlarged prostate gland. 8. Lumbar spondylosis particularly at L3-4 and L5-S1.   Electronically Signed   By: Herbie Baltimore M.D.   On: 06/20/2014 13:58   Ct Cervical Spine Wo Contrast  06/20/2014   CLINICAL DATA:  Fall.  Hypoxia.  EXAM: CT HEAD WITHOUT CONTRAST  CT CERVICAL SPINE WITHOUT CONTRAST  TECHNIQUE: Multidetector CT imaging of the head and cervical spine was performed following the standard protocol without intravenous contrast. Multiplanar CT image reconstructions of the cervical spine were also generated.  COMPARISON:  Report from 11/24/2000  FINDINGS: CT HEAD FINDINGS  Hypodense lesion in the right middle cranial fossa tracking up into the sylvian fissure, fluid density, probably an arachnoid cyst or epidermoid. Size 4.0 by 3.2 by approximately 6.0 cm.  Atherosclerosis noted. The brainstem, cerebellum, cerebral peduncle sits, thalami, and basal ganglia appear unremarkable. The ventricular system appears normal and aside from the right middle cranial fossa lesion the basilar cisterns appear normal.  Periventricular white matter and corona  radiata hypodensities favor chronic ischemic microvascular white matter disease. No intracranial hemorrhage, mass lesion, or acute CVA. There is a subcutaneous lesion along the occiput/upper neck in the midline on image 1 of series 3 which could represent subcutaneous bruising. Chronic left frontal sinusitis.  CT CERVICAL SPINE FINDINGS  Considerable degenerative findings at the craniocervical junction and anterior arch of C1-2 with extensive spurring of the basion and pannus formation posterior to the odontoid. Prominent loss of articular space at the anterior  C1-2 articulation.  Uncinate and facet spurring cause osseous foraminal stenosis on the right at C2-3 and on the left at C3-4, C4-5, and C6-7. There is interbody fusion at the C5-6 low-level and facet fusion on the left at C4-5 and C7-T1.  There is 2 mm degenerative anterolisthesis at C4-5 and at C7-T1. Degenerative endplate sclerosis noted with posterior osseous ridging at C6-7, C7-T1, and T1-2.  No prevertebral soft tissue swelling. No acute fracture is identified. There is dextroconvex cervical scoliosis.  Prominent emphysema and scarring noted at the lung apices.  IMPRESSION: 1. Right middle cranial fossa arachnoid cyst or epidermoid tracking into the sylvian fissure. MRI can typically differentiate between these 2 entities if clinically warranted. 2. Atherosclerosis. 3. Dense subcutaneous lesion along the occiput but and upper neck could represent some focal bruising. 4. Chronic left frontal sinusitis. 5. Cervical spondylosis causes multilevel osseous foraminal impingement. 6. Considerable spurring at the C1- 2 articulation and along the basion, with pannus formation posterior to the odontoid-rheumatoid arthropathy not excluded. 7. Emphysema.   Electronically Signed   By: Herbie Baltimore M.D.   On: 06/20/2014 13:36   Dg Chest Port 1 View  06/23/2014   CLINICAL DATA:  Acute respiratory failure, hypoxia  EXAM: PORTABLE CHEST - 1 VIEW  COMPARISON:   06/20/2014  FINDINGS: Cardiomediastinal silhouette is stable. Again noted reticular diffuse interstitial prominence bilaterally probable due to chronic interstitial lung disease and fibrotic changes. No definite superimposed infiltrate or pulmonary edema. Poorly visualized nodule in right upper lobe again noted measures about 2 cm. This was better visualized on recent CT scan.  IMPRESSION: Again noted diffuse reticular interstitial prominence probable due to chronic interstitial lung disease and fibrotic changes. No definite superimposed infiltrate. Again noted nodule in right upper lobe medially. This was better visualized on recent CT scan.   Electronically Signed   By: Natasha Mead M.D.   On: 06/23/2014 11:01   Dg Chest Portable 1 View  06/20/2014   CLINICAL DATA:  Fall.  Pneumonia.  Shortness of breath.  Confusion.  EXAM: PORTABLE CHEST - 1 VIEW  COMPARISON:  05/19/2014  FINDINGS: Diffuse interstitial and patchy airspace opacities are present superimposed on emphysema. Atherosclerotic aortic arch and prior CABG. Heart size within normal limits for projection. Stable biapical pleural parenchymal scarring. No pneumothorax observed. No definite blunting of the costophrenic angles.  IMPRESSION: 1. Bilateral interstitial and airspace opacities, worsened, superimposed on severe emphysema. Appearance could reflect edema or atypical pneumonia.   Electronically Signed   By: Herbie Baltimore M.D.   On: 06/20/2014 12:09    Subjective: Patient is mildly SOB on exam; tachypneic and with needs for NRB mask.  Objective: Filed Vitals:   06/25/14 0439 06/25/14 0741 06/25/14 1032 06/25/14 1351  BP: 147/48     Pulse: 54     Temp: 97.4 F (36.3 C)  97.5 F (36.4 C)   TempSrc: Oral  Oral   Resp: 18     Height:      Weight: 80.1 kg (176 lb 9.4 oz)     SpO2: 100% 96%  96%    Intake/Output Summary (Last 24 hours) at 06/25/14 1639 Last data filed at 06/25/14 1400  Gross per 24 hour  Intake   2685 ml  Output    1025 ml  Net   1660 ml   Weight change: 1.6 kg (3 lb 8.4 oz) Exam:   General:  Pt is alert, follows commands appropriately, mild resp distress and needs to be back on NRB;  positive wheezing on exam.  HEENT: No icterus, No thrush, [place]/AT  Cardiovascular: RRR, S1/S2, no rubs, no gallops, +JVD; positive SEM  Respiratory: no crackles, positive wheezing; tachypnea  Abdomen: Soft/+BS, non tender, non distended, no guarding  Extremities: No edema, No lymphangitis, No petechiae, No rashes, no synovitis  Data Reviewed: Basic Metabolic Panel:  Recent Labs Lab 06/20/14 1201 06/21/14 0455 06/22/14 0515 06/23/14 0420 06/25/14 0502  NA 145 142 137 140 138  K 4.5 4.1 4.1 4.5 4.7  CL 108 109 107 110 112  CO2 19 24 23 23 21   GLUCOSE 208* 136* 175* 87 383*  BUN 41* 47* 59* 66* 76*  CREATININE 2.09* 2.49* 3.22* 3.46* 2.35*  CALCIUM 9.4 8.4 8.0* 8.5 8.4   Liver Function Tests:  Recent Labs Lab 06/20/14 1201  AST 26  ALT 15  ALKPHOS 85  BILITOT 1.0  PROT 8.2  ALBUMIN 3.3*   CBC:  Recent Labs Lab 06/20/14 1201 06/21/14 0455 06/22/14 0515 06/25/14 0502  WBC 11.8* 10.5 9.1 9.9  NEUTROABS 9.9*  --   --   --   HGB 10.0* 9.0* 8.4* 7.9*  HCT 32.7* 29.4* 28.2* 25.7*  MCV 94.2 94.5 96.2 92.8  PLT 211 197 210 220   Cardiac Enzymes:  Recent Labs Lab 06/20/14 1201  TROPONINI <0.30   BNP: Invalid input(s): POCBNP CBG:  Recent Labs Lab 06/24/14 1155 06/24/14 1652 06/24/14 2128 06/25/14 0803 06/25/14 1125  GLUCAP 359* 204* 366* 317* 231*    Recent Results (from the past 240 hour(s))  Culture, blood (routine x 2)     Status: None (Preliminary result)   Collection Time: 06/20/14  5:42 PM  Result Value Ref Range Status   Specimen Description BLOOD RIGHT ARM  Final   Special Requests BOTTLES DRAWN AEROBIC AND ANAEROBIC 10CC  Final   Culture  Setup Time   Final    06/21/2014 02:53 Performed at [facility]    Culture   Final           BLOOD CULTURE  RECEIVED NO GROWTH TO DATE CULTURE WILL BE HELD FOR 5 DAYS BEFORE ISSUING A FINAL NEGATIVE REPORT Performed at [facility]    Report Status PENDING  Incomplete  Culture, blood (routine x 2)     Status: None (Preliminary result)   Collection Time: 06/20/14  5:50 PM  Result Value Ref Range Status   Specimen Description BLOOD LEFT HAND  Final   Special Requests BOTTLES DRAWN AEROBIC ONLY 5CC  Final   Culture  Setup Time   Final    06/21/2014 02:56 Performed at [facility]    Culture   Final           BLOOD CULTURE RECEIVED NO GROWTH TO DATE CULTURE WILL BE HELD FOR 5 DAYS BEFORE ISSUING A FINAL NEGATIVE REPORT Performed at [facility]    Report Status PENDING  Incomplete     Scheduled Meds: . aspirin EC  81 mg Oral Q breakfast  . bisacodyl  10 mg Rectal Once  . cefUROXime  250 mg Oral BID WC  . docusate sodium  100 mg Oral BID  . ezetimibe  10 mg Oral Q breakfast  . ferrous sulfate  325 mg Oral Q breakfast  . gabapentin  300 mg Oral Q24H  . gabapentin  600 mg Oral BID  . heparin  5,000 Units Subcutaneous 3 times per day  . insulin aspart  0-15 Units Subcutaneous TID WC  . insulin glargine  20 Units Subcutaneous QHS  . ipratropium-albuterol  3 mL Nebulization TID  . methylPREDNISolone (SOLU-MEDROL) injection  80 mg Intravenous Q12H  . metoprolol  25 mg Oral BID  . oxyCODONE-acetaminophen  1 tablet Oral Q24H  . oxyCODONE-acetaminophen  2 tablet Oral BID  . sodium chloride  3 mL Intravenous Q12H   Continuous Infusions: . sodium chloride 75 mL/hr at 06/23/14 2237     Marcha Dutton  [facility] Pager [phone]  If 7PM-7AM, please contact night-coverage [email or web address] Password [facility] 06/25/2014, 4:39 PM   LOS: 5 days

## 2014-06-25 NOTE — Progress Notes (Signed)
PULMONARY / CRITICAL CARE MEDICINE   Name: [name] [name] MRN: [id number] DOB: Feb 17, 1929    ADMISSION DATE:  06/20/2014 CONSULTATION DATE:  06/21/2014  REFERRING MD :   Tat  CHIEF COMPLAINT: Shortness of breath  INITIAL PRESENTATION: 80 year old male with no prior pulmonary history was admitted on 06/20/2014 with acute hypoxemic respiratory failure after an upper respiratory infection syndrome vs amiodarone toxicity (amio d/c'd on 06/21/14 with ESR = 75)   STUDIES:  12/21 CT chest >> there is notable emphysema in the lungs bilaterally with honeycombing in the bases and interstitial thickening, overall picture worrisome for interstitial lung disease superimposed on a background of emphysema. See radiology report for other findings including aorta, status post grafting. 12/22  ECHO >> EF 50-55%, mod LVH, PA Peak 41, dilated descending thoracic aorta 4.4 cm   SIGNIFICANT EVENTS: 12/22  PCCM consulted for hypoxemic respiratory failure 12/23  Remains on 15L O2, started solumedrol; ILD serology labs negative 12/24  O2 requirements down to 5L   SUBJECTIVE:    baseline = walk with a walker x 100 ft,  No 02, last did this 2 months pta  Needing increase fio2 x 24 h    VITAL SIGNS: Temp:  [97.4 F (36.3 C)-97.5 F (36.4 C)] 97.5 F (36.4 C) (12/26 1032) Pulse Rate:  [54-68] 54 (12/26 0439) Resp:  [18] 18 (12/26 0439) BP: (140-147)/(45-48) 147/48 mmHg (12/26 0439) SpO2:  [91 %-100 %] 96 % (12/26 1351) FiO2 (%):  [100 %] 100 % (12/26 1351) Weight:  [176 lb 9.4 oz (80.1 kg)] 176 lb 9.4 oz (80.1 kg) (12/26 0439)  VENTILATOR SETTINGS: Vent Mode:  [-]  FiO2 (%):  [100 %] 100 %   INTAKE / OUTPUT:  Intake/Output Summary (Last 24 hours) at 06/25/14 1503 Last data filed at 06/25/14 1400  Gross per 24 hour  Intake   2685 ml  Output   1025 ml  Net   1660 ml    PHYSICAL EXAMINATION: General:  Comfortable in bed but requiring nrm Neuro: A&OX4 HEENT: NCAT, OP Clear PULM: Crackles  bilaterally on insp/ no wheeze  CV: RRR, no mgr Ab: BS+, soft Ext; warm, no edema   LABS:  CBC  Recent Labs Lab 06/21/14 0455 06/22/14 0515 06/25/14 0502  WBC 10.5 9.1 9.9  HGB 9.0* 8.4* 7.9*  HCT 29.4* 28.2* 25.7*  PLT 197 210 220   Coag's  Recent Labs Lab 06/20/14 1201  INR 1.41   BMET  Recent Labs Lab 06/22/14 0515 06/23/14 0420 06/25/14 0502  NA 137 140 138  K 4.1 4.5 4.7  CL 107 110 112  CO2 _0 BUN 59* 66* 76*  CREATININE 3.22* 3.46* 2.35*  GLUCOSE 175* 87 383*   Electrolytes  Recent Labs Lab 06/22/14 0515 06/23/14 0420 06/25/14 0502  CALCIUM 8.0* 8.5 8.4   Sepsis Markers  Recent Labs Lab 06/20/14 1207 06/20/14 1742 06/22/14 0515 06/24/14 0416  LATICACIDVEN 2.26*  --   --   --   PROCALCITON  --  0.67 1.33 0.53   ABG  Recent Labs Lab 06/20/14 1301  PHART 7.409  PCO2ART 34.0*  PO2ART 84.6   Liver Enzymes  Recent Labs Lab 06/20/14 1201  AST 26  ALT 15  ALKPHOS 85  BILITOT 1.0  ALBUMIN 3.3*   Cardiac Enzymes  Recent Labs Lab 06/20/14 1201 06/20/14 1202  TROPONINI <0.30  --   PROBNP  --  14250.0*   Glucose  Recent Labs Lab 06/24/14 0748 06/24/14 1155 06/24/14  1652 06/24/14 2128 06/25/14 0803 06/25/14 1125  GLUCAP 426* 359* 204* 366* 317* 231*   Lab Results  Component Value Date   ESRSEDRATE 75* 06/21/2014    Imaging    ASSESSMENT / PLAN:  PULMONARY A:  Acute hypoxemic respiratory failure: See previous discussion for DDX, Given improvement with steroids and holding amiodarone (and lack of response to antibiotics and diuretics), c/w amiodarone pulmonary toxicity  Severe underlying emphysema, not in exacerbation PAH - pa peak 41 on ECHO P:   - worse with po pred > 12/26 change back to solumedrol 80 q 12h and restarted  -continue O2 as needed for saturations > 88% -amiodarone permanently d/c'd 12/22 -will need O2 eval prior to discharge -will make arrangements for hospital follow up with  McQuaid and or Tammy Parrett in pulmonary clinic with a CXR  CARDIOVASCULAR A:  Acute diastolic heart failure? Has elevated JVD, proBNP Afib  P:  -hold further diuresis with rise of serum cr  -may need cardiology to see him after stopping amiodarone (follows with Dr. Claiborne Billings), would recommend rate control for now  INFECTIOUS U. Legionella 12/22 > neg  RVF 12/22 >> neg  BCx2 12/21 >>  A:  Don't think he has HCAP P: No role for abx        Christinia Gully, MD Pulmonary [facility] [place] [phone] After 5:30 PM or weekends, call [phone]

## 2014-06-26 LAB — URINE MICROSCOPIC-ADD ON

## 2014-06-26 LAB — BLOOD GAS, ARTERIAL
ACID-BASE DEFICIT: 4.1 mmol/L — AB (ref 0.0–2.0)
BICARBONATE: 19.8 meq/L — AB (ref 20.0–24.0)
DRAWN BY: 308601
Delivery systems: POSITIVE
Expiratory PAP: 5
FIO2: 1 %
Inspiratory PAP: 10
Mode: POSITIVE
O2 Saturation: 97.3 %
PO2 ART: 200 mmHg — AB (ref 80.0–100.0)
Patient temperature: 98.6
TCO2: 18.9 mmol/L (ref 0–100)
pCO2 arterial: 33.2 mmHg — ABNORMAL LOW (ref 35.0–45.0)
pH, Arterial: 7.392 (ref 7.350–7.450)

## 2014-06-26 LAB — CBC
HEMATOCRIT: 28.8 % — AB (ref 39.0–52.0)
Hemoglobin: 9 g/dL — ABNORMAL LOW (ref 13.0–17.0)
MCH: 28.5 pg (ref 26.0–34.0)
MCHC: 31.3 g/dL (ref 30.0–36.0)
MCV: 91.1 fL (ref 78.0–100.0)
Platelets: 257 10*3/uL (ref 150–400)
RBC: 3.16 MIL/uL — ABNORMAL LOW (ref 4.22–5.81)
RDW: 16.7 % — ABNORMAL HIGH (ref 11.5–15.5)
WBC: 11.8 10*3/uL — AB (ref 4.0–10.5)

## 2014-06-26 LAB — GLUCOSE, CAPILLARY
GLUCOSE-CAPILLARY: 254 mg/dL — AB (ref 70–99)
GLUCOSE-CAPILLARY: 174 mg/dL — AB (ref 70–99)
Glucose-Capillary: 181 mg/dL — ABNORMAL HIGH (ref 70–99)

## 2014-06-26 LAB — BASIC METABOLIC PANEL
ANION GAP: 5 (ref 5–15)
BUN: 66 mg/dL — ABNORMAL HIGH (ref 6–23)
CHLORIDE: 113 meq/L — AB (ref 96–112)
CO2: 21 mmol/L (ref 19–32)
CREATININE: 1.91 mg/dL — AB (ref 0.50–1.35)
Calcium: 8.9 mg/dL (ref 8.4–10.5)
GFR calc non Af Amer: 31 mL/min — ABNORMAL LOW (ref >90)
GFR, EST AFRICAN AMERICAN: 36 mL/min — AB (ref >90)
Glucose, Bld: 274 mg/dL — ABNORMAL HIGH (ref 70–99)
Potassium: 4.4 mmol/L (ref 3.5–5.1)
SODIUM: 139 mmol/L (ref 135–145)

## 2014-06-26 LAB — PROCALCITONIN: PROCALCITONIN: 0.22 ng/mL

## 2014-06-26 LAB — URINALYSIS, ROUTINE W REFLEX MICROSCOPIC
Bilirubin Urine: NEGATIVE
GLUCOSE, UA: NEGATIVE mg/dL
Hgb urine dipstick: NEGATIVE
KETONES UR: NEGATIVE mg/dL
NITRITE: NEGATIVE
Protein, ur: NEGATIVE mg/dL
Specific Gravity, Urine: 1.008 (ref 1.005–1.030)
UROBILINOGEN UA: 0.2 mg/dL (ref 0.0–1.0)
pH: 5 (ref 5.0–8.0)

## 2014-06-26 LAB — MRSA PCR SCREENING: MRSA BY PCR: NEGATIVE

## 2014-06-26 MED ORDER — HYDRALAZINE HCL 20 MG/ML IJ SOLN
20.0000 mg | Freq: Four times a day (QID) | INTRAMUSCULAR | Status: DC | PRN
  Administered 2014-06-26 – 2014-06-28 (×3): 20 mg via INTRAVENOUS
  Filled 2014-06-26 (×3): qty 1

## 2014-06-26 MED ORDER — CETYLPYRIDINIUM CHLORIDE 0.05 % MT LIQD
7.0000 mL | Freq: Two times a day (BID) | OROMUCOSAL | Status: DC
  Administered 2014-06-26 – 2014-06-28 (×5): 7 mL via OROMUCOSAL

## 2014-06-26 MED ORDER — HYDRALAZINE HCL 20 MG/ML IJ SOLN
10.0000 mg | Freq: Four times a day (QID) | INTRAMUSCULAR | Status: DC | PRN
  Administered 2014-06-26: 10 mg via INTRAVENOUS
  Filled 2014-06-26: qty 1

## 2014-06-26 MED ORDER — INSULIN GLARGINE 100 UNIT/ML ~~LOC~~ SOLN
25.0000 [IU] | Freq: Every day | SUBCUTANEOUS | Status: DC
Start: 2014-06-26 — End: 2014-06-27
  Administered 2014-06-26: 25 [IU] via SUBCUTANEOUS
  Filled 2014-06-26 (×2): qty 0.25

## 2014-06-26 MED ORDER — HYDRALAZINE HCL 20 MG/ML IJ SOLN
10.0000 mg | Freq: Once | INTRAMUSCULAR | Status: AC
  Administered 2014-06-26: 10 mg via INTRAVENOUS
  Filled 2014-06-26: qty 1

## 2014-06-26 MED ORDER — CHLORHEXIDINE GLUCONATE 0.12 % MT SOLN
15.0000 mL | Freq: Two times a day (BID) | OROMUCOSAL | Status: DC
  Administered 2014-06-26 – 2014-06-30 (×9): 15 mL via OROMUCOSAL
  Filled 2014-06-26 (×11): qty 15

## 2014-06-26 MED ORDER — FUROSEMIDE 10 MG/ML IJ SOLN
40.0000 mg | Freq: Two times a day (BID) | INTRAMUSCULAR | Status: DC
  Administered 2014-06-26 (×2): 40 mg via INTRAVENOUS
  Filled 2014-06-26 (×2): qty 4

## 2014-06-26 MED ORDER — FUROSEMIDE 10 MG/ML IJ SOLN
20.0000 mg | Freq: Once | INTRAMUSCULAR | Status: AC
  Administered 2014-06-26: 20 mg via INTRAVENOUS
  Filled 2014-06-26: qty 2

## 2014-06-26 NOTE — Progress Notes (Signed)
PROGRESS NOTE  [name] [name] ZOX:[id number] DOB: 08/09/1928 DOA: 06/20/2014 PCP: [name], MD  Brief history 80 year old male with a history of COPD, paroxysmal A. fib, CKD stage III, diabetes mellitus, hypertension, thoracic aortic aneurysm repair presents with 3 day history of worsening shortness of breath. Notably, the patient was recently admitted to the hospital from November 18 through 05/21/2014 for pneumonia. The patient was discharged home with levofloxacin. The patient presented to his primary care physician's office on the day of admission when he was noted to have oxygen saturation of 80%. He was sent to the emergency department. In the emergency department, the patient was noted to have oxygen saturation of 63% on room air. CT of the chest showed "mixed density surrounding tubular calcification in the ascending thoracic aorta potentially from chronic dissection, chronic muralthrombus, or even intramural hematoma. Some of this appearance could be from an ascending aortic graft". CT of the abdomen and pelvis was negative for any acute findings, but it does show prominent stool.   Assessment/Plan: Acute respiratory failure -Likely multifactorial with the possibility of pulmonary edema, pneumonia (HCAP), and consideration for underlying ILD or BOOP/ Amiodarone pulmonary toxicity -06/25/14 evening-patient developed respiratory distress -06/26/2014 chest x-ray--worsening diffuse interstitial infiltrates -06/25/2014--intravenous Solu-Medrol restarted by pulmonary - at time of admission, The patient did not have any significant clinical improvement with intravenous furosemide x 4 doses -will follow rec's per Dr. Blaine Hamper. Sherene Sires -Recheck BMP, CBC, procalcitonin  -Redose furosemide 20mg  IV x 1--weight increased 73.4kg-->79.9kg since admission  Pulmonary opacities/infiltrates -No significant clinical improvement with intravenous furosemide 4 doses -will follow pulmonary  rec's -since no clear signs of lung infection will narrow antibiotics to cover only UTI -Blood cultures 2 sets--Unfortunately, this was not initially ordered, and Zosyn was given prior to collection of the blood cultures -check procalcitonin-->0.67 -Continue bronchodilators -06/26/2014 chest x-ray shows worsening bilateral interstitial opacities--> redosed intravenous furosemide  Elevated proBNP  -Although the patient has CKD stage III which may elevate BNP, this is more elevated than his prior admission  -Although the patient has some JVD on examination, he does not appear to be clinically fluid overloaded  -11/02/13 Echo did not show any RV dysfunction, TR, or pulm HTN  -06/21/2014-- echocardiogram EF 50-55%, increased EDP, mild TR, PAP 41 -I/O's, daily weights -low sodium diet  Pyuria -TNTC WBC in urine  -continue zosyn; will narrow to ceftin for 5 more days to complete tx for UTI -unfortunately, urine culture was not sent from ED -Today is day #7 of antibiotics   Acute on CKD stage III  -Baseline creatinine 1.7-2.1  -due to diuresis and presumed UTI -Cr up to 2.35 -will follow renal function trend  Altered mental status -likely due to infection/hypoxemia -resolved now -The patient is now A&Ox3  -CT brain neg for acute findings  COPD  -Cannot rule out possibility of underlying BOOP/ILD -continue bronchodilators -no wheezing presently -per PCCM will continue steroids for potential ILD  Paroxysmal Atrial fibrillation  -Rate controlled  -Presently in sinus rhythm  -Discontinue apixiban given the patient's recent history of multiple falls  -continue aspirin  -discontinue amiodarone, due to current lung issues   Diabetes mellitus type 2  -05/18/2014 hemoglobin A1c 6.5  -CBGs elevated secondary to steroids, increase Lantus to 25 units  -NovoLog sliding scale  -will adjust coverage as needed while on steroids  Hypertension  -Continue metoprolol tartrate   -Hold Cardura -will continue holding lasix given recent worsening on renal function  coronary artery disease  -Patient had CABG in 2006 -Currently chest pain-free -Continue metoprolol tartrate   Thoracic Aortic Aneursym -s/p repair -changes on CT likely due to previous repair -continue outpatient follow up  Constipation -colace and bisacodyl -will use enema if no improvement  Family Communication: No family at bedside Disposition Plan: Home when medically stable   Antibiotics: Vancomycin 06/20/14>>> Zosyn 06/20/14>>> zithromax 12/22>>    Procedures/Studies: Ct Abdomen Pelvis Wo Contrast  06/20/2014   CLINICAL DATA:  Fall in bathroom. Anti coagulation. Increased confusion.  EXAM: CT CHEST, ABDOMEN AND PELVIS WITHOUT CONTRAST  TECHNIQUE: Multidetector CT imaging of the chest, abdomen and pelvis was performed following the standard protocol without IV contrast.  COMPARISON:  Multiple exams, including 05/19/2014 and 03/26/2005  FINDINGS: CT CHEST FINDINGS  Right upper paratracheal node 1.2 cm in short axis, image 12 series 2. Subcarinal node 1.4 cm in short axis, image 34 series 2. Additional paratracheal nodes are present. If hilar nodes are enlarged there difficult to measure against the background vasculature.  Aortic and branch vessel atherosclerosis. Prior CABG. Ascending thoracic aorta 4.6 cm transverse ; descending thoracic aorta 4.6 cm transverse. Lower descending thoracic aorta 5.0 cm transverse. There is mixed density surrounding tubular calcification in the ascending thoracic aorta potentially from chronic dissection, chronic mural thrombus, or even intramural hematoma. Some of this appearance could be from an ascending aortic graft-correlate with patient history. Without IV contrast did is difficult to characterize this further.  Trace left pleural effusion. Mild enlargement of the cardiopericardial silhouette  Emphysema is present with scattered scarring and peripheral  fibrosis potentially with some early honeycombing at the lung bases. In addition, there is a 2.4 by 1.4 cm nodule anteriorly in the right upper lobe, image 25 of series 5 interstitial accentuation is present bilaterally.  CT ABDOMEN AND PELVIS FINDINGS  Hepatobiliary: Unremarkable  Pancreas: Speckled calcifications throughout the pancreatic parenchyma compatible with chronic calcific pancreatitis.  Spleen: Unremarkable  Adrenals/Urinary Tract: 8 mm left kidney lower pole nonobstructive calculus. Bilateral fluid density renal lesions favoring cysts. No hydronephrosis or hydroureter. No ureteral calculi.  Stomach/Bowel: Prominent stool throughout the colon favors constipation.  Vascular/Lymphatic: Dense aortoiliac atherosclerosis.  Reproductive: Enlarged prostate gland with scattered calcifications, measuring 5.8 by 4.6 cm.  Other: No supplemental non-categorized findings.  Musculoskeletal: Bridging fusion of the sacroiliac joints. posterolateral rod and pedicle screw fixation at L4-5 with interbody and anterior facet fusion. Intervertebral and facet spurring at L3-4 and L5-S1. Small bilateral inguinal hernias contain adipose tissue.  IMPRESSION: 1. 2.4 by 1.4 cm right upper lobe pulmonary nodule with right paratracheal adenopathy, concerning for malignancy. An atypical infectious process could potentially give this appearance. Consider nuclear medicine PET-CT. 2. Severe emphysema with peripheral fibrosis, interstitial accentuation, and some basilar honeycombing. 3. Ascending and descending thoracic aortic aneurysms measuring up to 5 cm transversely. There is a mixed density in the ascending aortic aneurysm with a complex appearance, possibly from graft material and excluded thrombus, chronic mural thrombus, chronic dissection, or intramural hematoma. Correlate with operative history. If IV contrast could not be administered, MRI might help further assess the thoracic aorta. Otherwise I recommend semi-annual imaging  followup by CTA or MRA and referral to cardiothoracic surgery if not already obtained. This recommendation follows 2010 ACCF/AHA/AATS/ACR/ASA/SCA/SCAI/SIR/STS/SVM Guidelines for the Diagnosis and Management of Patients With Thoracic Aortic Disease. Circulation. 2010; 121: Z610-R604. 4. Trace left pleural effusion. 5. Chronic calcific pancreatitis. 6.  Prominent stool throughout the colon favors constipation. 7. Enlarged prostate gland. 8. Lumbar spondylosis particularly  at L3-4 and L5-S1.   Electronically Signed   By: Herbie Baltimore M.D.   On: 06/20/2014 13:58   Ct Head Wo Contrast  06/20/2014   CLINICAL DATA:  Fall.  Hypoxia.  EXAM: CT HEAD WITHOUT CONTRAST  CT CERVICAL SPINE WITHOUT CONTRAST  TECHNIQUE: Multidetector CT imaging of the head and cervical spine was performed following the standard protocol without intravenous contrast. Multiplanar CT image reconstructions of the cervical spine were also generated.  COMPARISON:  Report from 11/24/2000  FINDINGS: CT HEAD FINDINGS  Hypodense lesion in the right middle cranial fossa tracking up into the sylvian fissure, fluid density, probably an arachnoid cyst or epidermoid. Size 4.0 by 3.2 by approximately 6.0 cm.  Atherosclerosis noted. The brainstem, cerebellum, cerebral peduncle sits, thalami, and basal ganglia appear unremarkable. The ventricular system appears normal and aside from the right middle cranial fossa lesion the basilar cisterns appear normal.  Periventricular white matter and corona radiata hypodensities favor chronic ischemic microvascular white matter disease. No intracranial hemorrhage, mass lesion, or acute CVA. There is a subcutaneous lesion along the occiput/upper neck in the midline on image 1 of series 3 which could represent subcutaneous bruising. Chronic left frontal sinusitis.  CT CERVICAL SPINE FINDINGS  Considerable degenerative findings at the craniocervical junction and anterior arch of C1-2 with extensive spurring of the basion  and pannus formation posterior to the odontoid. Prominent loss of articular space at the anterior C1-2 articulation.  Uncinate and facet spurring cause osseous foraminal stenosis on the right at C2-3 and on the left at C3-4, C4-5, and C6-7. There is interbody fusion at the C5-6 low-level and facet fusion on the left at C4-5 and C7-T1.  There is 2 mm degenerative anterolisthesis at C4-5 and at C7-T1. Degenerative endplate sclerosis noted with posterior osseous ridging at C6-7, C7-T1, and T1-2.  No prevertebral soft tissue swelling. No acute fracture is identified. There is dextroconvex cervical scoliosis.  Prominent emphysema and scarring noted at the lung apices.  IMPRESSION: 1. Right middle cranial fossa arachnoid cyst or epidermoid tracking into the sylvian fissure. MRI can typically differentiate between these 2 entities if clinically warranted. 2. Atherosclerosis. 3. Dense subcutaneous lesion along the occiput but and upper neck could represent some focal bruising. 4. Chronic left frontal sinusitis. 5. Cervical spondylosis causes multilevel osseous foraminal impingement. 6. Considerable spurring at the C1- 2 articulation and along the basion, with pannus formation posterior to the odontoid-rheumatoid arthropathy not excluded. 7. Emphysema.   Electronically Signed   By: Herbie Baltimore M.D.   On: 06/20/2014 13:36   Ct Chest Wo Contrast  06/20/2014   CLINICAL DATA:  Fall in bathroom. Anti coagulation. Increased confusion.  EXAM: CT CHEST, ABDOMEN AND PELVIS WITHOUT CONTRAST  TECHNIQUE: Multidetector CT imaging of the chest, abdomen and pelvis was performed following the standard protocol without IV contrast.  COMPARISON:  Multiple exams, including 05/19/2014 and 03/26/2005  FINDINGS: CT CHEST FINDINGS  Right upper paratracheal node 1.2 cm in short axis, image 12 series 2. Subcarinal node 1.4 cm in short axis, image 34 series 2. Additional paratracheal nodes are present. If hilar nodes are enlarged there  difficult to measure against the background vasculature.  Aortic and branch vessel atherosclerosis. Prior CABG. Ascending thoracic aorta 4.6 cm transverse ; descending thoracic aorta 4.6 cm transverse. Lower descending thoracic aorta 5.0 cm transverse. There is mixed density surrounding tubular calcification in the ascending thoracic aorta potentially from chronic dissection, chronic mural thrombus, or even intramural hematoma. Some of this appearance could  be from an ascending aortic graft-correlate with patient history. Without IV contrast did is difficult to characterize this further.  Trace left pleural effusion. Mild enlargement of the cardiopericardial silhouette  Emphysema is present with scattered scarring and peripheral fibrosis potentially with some early honeycombing at the lung bases. In addition, there is a 2.4 by 1.4 cm nodule anteriorly in the right upper lobe, image 25 of series 5 interstitial accentuation is present bilaterally.  CT ABDOMEN AND PELVIS FINDINGS  Hepatobiliary: Unremarkable  Pancreas: Speckled calcifications throughout the pancreatic parenchyma compatible with chronic calcific pancreatitis.  Spleen: Unremarkable  Adrenals/Urinary Tract: 8 mm left kidney lower pole nonobstructive calculus. Bilateral fluid density renal lesions favoring cysts. No hydronephrosis or hydroureter. No ureteral calculi.  Stomach/Bowel: Prominent stool throughout the colon favors constipation.  Vascular/Lymphatic: Dense aortoiliac atherosclerosis.  Reproductive: Enlarged prostate gland with scattered calcifications, measuring 5.8 by 4.6 cm.  Other: No supplemental non-categorized findings.  Musculoskeletal: Bridging fusion of the sacroiliac joints. posterolateral rod and pedicle screw fixation at L4-5 with interbody and anterior facet fusion. Intervertebral and facet spurring at L3-4 and L5-S1. Small bilateral inguinal hernias contain adipose tissue.  IMPRESSION: 1. 2.4 by 1.4 cm right upper lobe pulmonary  nodule with right paratracheal adenopathy, concerning for malignancy. An atypical infectious process could potentially give this appearance. Consider nuclear medicine PET-CT. 2. Severe emphysema with peripheral fibrosis, interstitial accentuation, and some basilar honeycombing. 3. Ascending and descending thoracic aortic aneurysms measuring up to 5 cm transversely. There is a mixed density in the ascending aortic aneurysm with a complex appearance, possibly from graft material and excluded thrombus, chronic mural thrombus, chronic dissection, or intramural hematoma. Correlate with operative history. If IV contrast could not be administered, MRI might help further assess the thoracic aorta. Otherwise I recommend semi-annual imaging followup by CTA or MRA and referral to cardiothoracic surgery if not already obtained. This recommendation follows 2010 ACCF/AHA/AATS/ACR/ASA/SCA/SCAI/SIR/STS/SVM Guidelines for the Diagnosis and Management of Patients With Thoracic Aortic Disease. Circulation. 2010; 121: [id number]. 4. Trace left pleural effusion. 5. Chronic calcific pancreatitis. 6.  Prominent stool throughout the colon favors constipation. 7. Enlarged prostate gland. 8. Lumbar spondylosis particularly at L3-4 and L5-S1.   Electronically Signed   By: Herbie BaltimoreWalt  Liebkemann M.D.   On: 06/20/2014 13:58   Ct Cervical Spine Wo Contrast  06/20/2014   CLINICAL DATA:  Fall.  Hypoxia.  EXAM: CT HEAD WITHOUT CONTRAST  CT CERVICAL SPINE WITHOUT CONTRAST  TECHNIQUE: Multidetector CT imaging of the head and cervical spine was performed following the standard protocol without intravenous contrast. Multiplanar CT image reconstructions of the cervical spine were also generated.  COMPARISON:  Report from 11/24/2000  FINDINGS: CT HEAD FINDINGS  Hypodense lesion in the right middle cranial fossa tracking up into the sylvian fissure, fluid density, probably an arachnoid cyst or epidermoid. Size 4.0 by 3.2 by approximately 6.0 cm.   Atherosclerosis noted. The brainstem, cerebellum, cerebral peduncle sits, thalami, and basal ganglia appear unremarkable. The ventricular system appears normal and aside from the right middle cranial fossa lesion the basilar cisterns appear normal.  Periventricular white matter and corona radiata hypodensities favor chronic ischemic microvascular white matter disease. No intracranial hemorrhage, mass lesion, or acute CVA. There is a subcutaneous lesion along the occiput/upper neck in the midline on image 1 of series 3 which could represent subcutaneous bruising. Chronic left frontal sinusitis.  CT CERVICAL SPINE FINDINGS  Considerable degenerative findings at the craniocervical junction and anterior arch of C1-2 with extensive spurring of the basion and pannus  formation posterior to the odontoid. Prominent loss of articular space at the anterior C1-2 articulation.  Uncinate and facet spurring cause osseous foraminal stenosis on the right at C2-3 and on the left at C3-4, C4-5, and C6-7. There is interbody fusion at the C5-6 low-level and facet fusion on the left at C4-5 and C7-T1.  There is 2 mm degenerative anterolisthesis at C4-5 and at C7-T1. Degenerative endplate sclerosis noted with posterior osseous ridging at C6-7, C7-T1, and T1-2.  No prevertebral soft tissue swelling. No acute fracture is identified. There is dextroconvex cervical scoliosis.  Prominent emphysema and scarring noted at the lung apices.  IMPRESSION: 1. Right middle cranial fossa arachnoid cyst or epidermoid tracking into the sylvian fissure. MRI can typically differentiate between these 2 entities if clinically warranted. 2. Atherosclerosis. 3. Dense subcutaneous lesion along the occiput but and upper neck could represent some focal bruising. 4. Chronic left frontal sinusitis. 5. Cervical spondylosis causes multilevel osseous foraminal impingement. 6. Considerable spurring at the C1- 2 articulation and along the basion, with pannus formation  posterior to the odontoid-rheumatoid arthropathy not excluded. 7. Emphysema.   Electronically Signed   By: Herbie Baltimore M.D.   On: 06/20/2014 13:36   Dg Chest Port 1 View  06/25/2014   CLINICAL DATA:  Acute onset of hypoxia and worsening shortness of breath. Initial encounter.  EXAM: PORTABLE CHEST - 1 VIEW  COMPARISON:  Chest radiograph performed 06/23/2014, and CT of the chest performed 06/20/2014  FINDINGS: There is mildly worsened diffuse interstitial prominence, raising suspicion for mild pulmonary edema or atypical pneumonia, superimposed on the patient's chronic emphysema, fibrosis and interstitial lung disease. The known right upper lobe pulmonary nodule is better characterized on recent CT. No definite pleural effusion or pneumothorax is seen.  The cardiomediastinal silhouette remains normal in size. The patient is status post median sternotomy, with evidence of prior CABG. No acute osseous abnormalities are seen.  IMPRESSION: 1. Mildly worsened diffuse interstitial prominence, raising suspicion for mild pulmonary edema or atypical pneumonia, superimposed on the patient's chronic emphysema, fibrosis and interstitial lung disease. 2. Known right upper lobe pulmonary nodule is better characterized on recent CT.   Electronically Signed   By: Roanna Raider M.D.   On: 06/25/2014 21:53   Dg Chest Port 1 View  06/23/2014   CLINICAL DATA:  Acute respiratory failure, hypoxia  EXAM: PORTABLE CHEST - 1 VIEW  COMPARISON:  06/20/2014  FINDINGS: Cardiomediastinal silhouette is stable. Again noted reticular diffuse interstitial prominence bilaterally probable due to chronic interstitial lung disease and fibrotic changes. No definite superimposed infiltrate or pulmonary edema. Poorly visualized nodule in right upper lobe again noted measures about 2 cm. This was better visualized on recent CT scan.  IMPRESSION: Again noted diffuse reticular interstitial prominence probable due to chronic interstitial lung  disease and fibrotic changes. No definite superimposed infiltrate. Again noted nodule in right upper lobe medially. This was better visualized on recent CT scan.   Electronically Signed   By: Natasha Mead M.D.   On: 06/23/2014 11:01   Dg Chest Portable 1 View  06/20/2014   CLINICAL DATA:  Fall.  Pneumonia.  Shortness of breath.  Confusion.  EXAM: PORTABLE CHEST - 1 VIEW  COMPARISON:  05/19/2014  FINDINGS: Diffuse interstitial and patchy airspace opacities are present superimposed on emphysema. Atherosclerotic aortic arch and prior CABG. Heart size within normal limits for projection. Stable biapical pleural parenchymal scarring. No pneumothorax observed. No definite blunting of the costophrenic angles.  IMPRESSION: 1. Bilateral interstitial  and airspace opacities, worsened, superimposed on severe emphysema. Appearance could reflect edema or atypical pneumonia.   Electronically Signed   By: Herbie BaltimoreWalt  Liebkemann M.D.   On: 06/20/2014 12:09         Subjective:  patient developed respiratory distress last evening. He is breathing a lot better on BiPAP this morning. Denies any chest pain, shortness of breath, nausea, vomiting, abdominal pain, vomiting, diarrhea.  Objective: Filed Vitals:   06/26/14 0500 06/26/14 0600 06/26/14 0648 06/26/14 0700  BP: 186/63 183/56  195/51  Pulse: 54 56 72 64  Temp:      TempSrc:      Resp: 19 16 26 16   Height:      Weight:      SpO2: 97% 100% 92% 96%    Intake/Output Summary (Last 24 hours) at 06/26/14 0740 Last data filed at 06/26/14 [id number]  Gross per 24 hour  Intake   1560 ml  Output   2250 ml  Net   -690 ml   Weight change: -0.2 kg (-7.1 oz) Exam:   General:  Pt is alert, follows commands appropriately, not in acute distress  HEENT: No icterus, No thrush,  [place]/AT  Cardiovascular: RRR, S1/S2, no rubs, no gallops, +JVD  Respiratory: bilateral crackles, left greater than right. No wheezing. Good air movement   Abdomen: Soft/+BS, non tender, non  distended, no guarding  Extremities: trace LE edema, No lymphangitis, No petechiae, No rashes, no synovitis  Data Reviewed: Basic Metabolic Panel:  Recent Labs Lab 06/20/14 1201 06/21/14 0455 06/22/14 0515 06/23/14 0420 06/25/14 0502  NA 145 142 137 140 138  K 4.5 4.1 4.1 4.5 4.7  CL 108 109 107 110 112  CO2 19 24 23 23 21   GLUCOSE 208* 136* 175* 87 383*  BUN 41* 47* 59* 66* 76*  CREATININE 2.09* 2.49* 3.22* 3.46* 2.35*  CALCIUM 9.4 8.4 8.0* 8.5 8.4   Liver Function Tests:  Recent Labs Lab 06/20/14 1201  AST 26  ALT 15  ALKPHOS 85  BILITOT 1.0  PROT 8.2  ALBUMIN 3.3*   No results for input(s): LIPASE, AMYLASE in the last 168 hours. No results for input(s): AMMONIA in the last 168 hours. CBC:  Recent Labs Lab 06/20/14 1201 06/21/14 0455 06/22/14 0515 06/25/14 0502  WBC 11.8* 10.5 9.1 9.9  NEUTROABS 9.9*  --   --   --   HGB 10.0* 9.0* 8.4* 7.9*  HCT 32.7* 29.4* 28.2* 25.7*  MCV 94.2 94.5 96.2 92.8  PLT 211 197 210 220   Cardiac Enzymes:  Recent Labs Lab 06/20/14 1201  TROPONINI <0.30   BNP: Invalid input(s): POCBNP CBG:  Recent Labs Lab 06/24/14 2128 06/25/14 0803 06/25/14 1125 06/25/14 1702 06/25/14 2203  GLUCAP 366* 317* 231* 242* 292*    Recent Results (from the past 240 hour(s))  Culture, blood (routine x 2)     Status: None (Preliminary result)   Collection Time: 06/20/14  5:42 PM  Result Value Ref Range Status   Specimen Description BLOOD RIGHT ARM  Final   Special Requests BOTTLES DRAWN AEROBIC AND ANAEROBIC 10CC  Final   Culture  Setup Time   Final    06/21/2014 02:53 [facility]formed [facility]    Culture   Final           BLOOD CULTURE RECEIVED NO GROWTH TO DATE CULTURE WILL BE HELD FOR 5 DAYS BEFORE ISSUING A FINAL NEGATIVE REPORT Performed at [facility]    Report Status PENDING  Incomplete  Culture, blood (routine x 2)     Status: None (Preliminary result)   Collection Time: 06/20/14  5:50 PM  Result  Value Ref Range Status   Specimen Description BLOOD LEFT HAND  Final   Special Requests BOTTLES DRAWN AEROBIC ONLY 5CC  Final   Culture  Setup Time   Final    06/21/2014 02:56 Performed at [facility]    Culture   Final           BLOOD CULTURE RECEIVED NO GROWTH TO DATE CULTURE WILL BE HELD FOR 5 DAYS BEFORE ISSUING A FINAL NEGATIVE REPORT Performed at [facility]    Report Status PENDING  Incomplete     Scheduled Meds: . antiseptic oral rinse  7 mL Mouth Rinse q12n4p  . aspirin EC  81 mg Oral Q breakfast  . bisacodyl  10 mg Rectal Once  . cefUROXime  250 mg Oral BID WC  . chlorhexidine  15 mL Mouth Rinse BID  . docusate sodium  100 mg Oral BID  . ezetimibe  10 mg Oral Q breakfast  . ferrous sulfate  325 mg Oral Q breakfast  . gabapentin  300 mg Oral Q24H  . gabapentin  600 mg Oral BID  . heparin  5,000 Units Subcutaneous 3 times per day  . insulin aspart  0-15 Units Subcutaneous TID WC  . insulin glargine  20 Units Subcutaneous QHS  . ipratropium-albuterol  3 mL Nebulization TID  . methylPREDNISolone (SOLU-MEDROL) injection  80 mg Intravenous Q12H  . metoprolol  25 mg Oral BID  . oxyCODONE-acetaminophen  1 tablet Oral Q24H  . oxyCODONE-acetaminophen  2 tablet Oral BID  . sodium chloride  3 mL Intravenous Q12H   Continuous Infusions:    Corine Solorio, DO  [facility] Pager [phone]  If 7PM-7AM, please contact night-coverage [email or web address] Password [email or web address] 06/26/2014, 7:40 AM   LOS: 6 days

## 2014-06-26 NOTE — Plan of Care (Signed)
Problem: Consults Goal: Diabetes Guidelines if Diabetic/Glucose > 140 If diabetic or lab glucose is > 140 mg/dl - Initiate Diabetes/Hyperglycemia Guidelines & Document Interventions  Outcome: Not Progressing Pt on ACHS blood glucose test

## 2014-06-26 NOTE — Progress Notes (Signed)
PULMONARY / CRITICAL CARE MEDICINE   Name: [name] [name] MRN: [id number] DOB: 1929/06/26    ADMISSION DATE:  06/20/2014 CONSULTATION DATE:  06/21/2014  REFERRING MD :   Tat  CHIEF COMPLAINT: Shortness of breath  INITIAL PRESENTATION: 80 year old male with no prior pulmonary history was admitted on 06/20/2014 with acute hypoxemic respiratory failure after an upper respiratory infection syndrome vs amiodarone toxicity (amio d/c'd on 06/21/14 with ESR = 75)   STUDIES:  12/21 CT chest >> there is notable emphysema in the lungs bilaterally with honeycombing in the bases and interstitial thickening, overall picture worrisome for interstitial lung disease superimposed on a background of emphysema. See radiology report for other findings including aorta, status post grafting. 12/22  ECHO >> EF 50-55%, mod LVH, PA Peak 41, dilated descending thoracic aorta 4.4 cm   SIGNIFICANT EVENTS: 12/22  PCCM consulted for hypoxemic respiratory failure 12/23  Remains on 15L O2, started solumedrol; ILD serology labs negative 12/24  O2 requirements down to 5L 12/26 tr to SDU for bipap   SUBJECTIVE:  To SDU , on bipap Appears comfortable, tolerating well, down to 60%  VITAL SIGNS: Temp:  [97.4 F (36.3 C)-98.3 F (36.8 C)] 97.5 F (36.4 C) (12/27 0800) Pulse Rate:  [52-74] 68 (12/27 0958) Resp:  [14-26] 21 (12/27 0951) BP: (140-229)/(43-141) 177/47 mmHg (12/27 0951) SpO2:  [88 %-100 %] 94 % (12/27 0951) FiO2 (%):  [60 %-100 %] 60 % (12/27 0950) Weight:  [79.9 kg (176 lb 2.4 oz)] 79.9 kg (176 lb 2.4 oz) (12/27 0400)  VENTILATOR SETTINGS: Vent Mode:  [-]  FiO2 (%):  [60 %-100 %] 60 %   INTAKE / OUTPUT:  Intake/Output Summary (Last 24 hours) at 06/26/14 1218 Last data filed at 06/26/14 0746  Gross per 24 hour  Intake   1320 ml  Output   2400 ml  Net  -1080 ml    PHYSICAL EXAMINATION: General:  Comfortable in bed but requiring bipap Neuro: A&OX4 HEENT: NCAT, OP Clear PULM: Crackles  bilaterally on insp/ no wheeze  CV: RRR, no mgr Ab: BS+, soft Ext; warm, no edema   LABS:  CBC  Recent Labs Lab 06/22/14 0515 06/25/14 0502 06/26/14 0812  WBC 9.1 9.9 11.8*  HGB 8.4* 7.9* 9.0*  HCT 28.2* 25.7* 28.8*  PLT 210 220 257   Coag's  Recent Labs Lab 06/20/14 1201  INR 1.41   BMET  Recent Labs Lab 06/23/14 0420 06/25/14 0502 06/26/14 0812  NA 140 138 139  K 4.5 4.7 4.4  CL 110 112 113*  CO2 _0 BUN 66* 76* 66*  CREATININE 3.46* 2.35* 1.91*  GLUCOSE 87 383* 274*   Electrolytes  Recent Labs Lab 06/23/14 0420 06/25/14 0502 06/26/14 0812  CALCIUM 8.5 8.4 8.9   Sepsis Markers  Recent Labs Lab 06/20/14 1207  06/22/14 0515 06/24/14 0416 06/26/14 0812  LATICACIDVEN 2.26*  --   --   --   --   PROCALCITON  --   < > 1.33 0.53 0.22  < > = values in this interval not displayed. ABG  Recent Labs Lab 06/20/14 1301 06/26/14 0112  PHART 7.409 7.392  PCO2ART 34.0* 33.2*  PO2ART 84.6 200.0*   Liver Enzymes  Recent Labs Lab 06/20/14 1201  AST 26  ALT 15  ALKPHOS 85  BILITOT 1.0  ALBUMIN 3.3*   Cardiac Enzymes  Recent Labs Lab 06/20/14 1201 06/20/14 1202  TROPONINI <0.30  --   PROBNP  --  14250.0*  Glucose  Recent Labs Lab 06/24/14 2128 06/25/14 0803 06/25/14 1125 06/25/14 1702 06/25/14 2203 06/26/14 0829  GLUCAP 366* 317* 231* 242* 292* 254*   Lab Results  Component Value Date   ESRSEDRATE 75* 06/21/2014    Imaging    ASSESSMENT / PLAN:  PULMONARY A:  Acute hypoxemic respiratory failure: See previous discussion for DDX, Given improvement with steroids and holding amiodarone (and lack of response to antibiotics and diuretics), c/w amiodarone pulmonary toxicity  Severe underlying emphysema, not in exacerbation PAH - pa peak 41 on ECHO P:   - worse with po pred > 12/26 change back to solumedrol 80 q 12h  -continue O2 as needed for saturations > 88% -amiodarone permanently d/c'd 12/22 -will need O2 eval  prior to discharge   CARDIOVASCULAR A:  Acute diastolic heart failure? Has elevated JVD, proBNP Afib  P:  -resume  diuresis as cr permits -may need cardiology to see him after stopping amiodarone (follows with Dr. Claiborne Billings), would recommend rate control for now  INFECTIOUS U. Legionella 12/22 > neg  RVF 12/22 >> neg  BCx2 12/21 >> neg A:  Don't think he has HCAP P: No role for abx , also note low pct  Prognosis guarded - if amio toxicity, can take weeks to resolve but expcet to see some improvement in hypoxia soon    Kara Mead MD. FCCP. [facility] Pager [phone] If no response call [phone]

## 2014-06-26 NOTE — Progress Notes (Signed)
Foley catheter leaking heavily.  Notified Dr. Arbutus Leasat.  Removing 107F and replacing with 68F.  Patient's wife removed a small amount of stool from the patient's bowel.  He tolerated this well.

## 2014-06-26 NOTE — Progress Notes (Signed)
Spouse contacted with update on patient and new room assignment. No questions or concerns at this time. Report called to ICU.

## 2014-06-27 ENCOUNTER — Encounter (HOSPITAL_COMMUNITY): Payer: Self-pay | Admitting: Cardiology

## 2014-06-27 ENCOUNTER — Inpatient Hospital Stay (HOSPITAL_COMMUNITY): Payer: Medicare Other

## 2014-06-27 DIAGNOSIS — I2584 Coronary atherosclerosis due to calcified coronary lesion: Secondary | ICD-10-CM

## 2014-06-27 DIAGNOSIS — Z951 Presence of aortocoronary bypass graft: Secondary | ICD-10-CM

## 2014-06-27 DIAGNOSIS — R0902 Hypoxemia: Secondary | ICD-10-CM

## 2014-06-27 DIAGNOSIS — I4819 Other persistent atrial fibrillation: Secondary | ICD-10-CM | POA: Insufficient documentation

## 2014-06-27 DIAGNOSIS — I481 Persistent atrial fibrillation: Secondary | ICD-10-CM

## 2014-06-27 DIAGNOSIS — Z8679 Personal history of other diseases of the circulatory system: Secondary | ICD-10-CM

## 2014-06-27 DIAGNOSIS — I2510 Atherosclerotic heart disease of native coronary artery without angina pectoris: Secondary | ICD-10-CM

## 2014-06-27 LAB — GLUCOSE, CAPILLARY
GLUCOSE-CAPILLARY: 254 mg/dL — AB (ref 70–99)
Glucose-Capillary: 226 mg/dL — ABNORMAL HIGH (ref 70–99)
Glucose-Capillary: 275 mg/dL — ABNORMAL HIGH (ref 70–99)
Glucose-Capillary: 318 mg/dL — ABNORMAL HIGH (ref 70–99)
Glucose-Capillary: 222 mg/dL — ABNORMAL HIGH (ref 70–99)

## 2014-06-27 LAB — BASIC METABOLIC PANEL
Anion gap: 8 (ref 5–15)
BUN: 72 mg/dL — ABNORMAL HIGH (ref 6–23)
CALCIUM: 9.1 mg/dL (ref 8.4–10.5)
CHLORIDE: 111 meq/L (ref 96–112)
CO2: 23 mmol/L (ref 19–32)
CREATININE: 1.97 mg/dL — AB (ref 0.50–1.35)
GFR calc Af Amer: 34 mL/min — ABNORMAL LOW (ref >90)
GFR calc non Af Amer: 30 mL/min — ABNORMAL LOW (ref >90)
GLUCOSE: 314 mg/dL — AB (ref 70–99)
Potassium: 4.1 mmol/L (ref 3.5–5.1)
Sodium: 142 mmol/L (ref 135–145)

## 2014-06-27 LAB — CULTURE, BLOOD (ROUTINE X 2)
CULTURE: NO GROWTH
Culture: NO GROWTH

## 2014-06-27 LAB — CBC
HCT: 29.1 % — ABNORMAL LOW (ref 39.0–52.0)
HEMOGLOBIN: 9 g/dL — AB (ref 13.0–17.0)
MCH: 27.8 pg (ref 26.0–34.0)
MCHC: 30.9 g/dL (ref 30.0–36.0)
MCV: 89.8 fL (ref 78.0–100.0)
Platelets: 259 10*3/uL (ref 150–400)
RBC: 3.24 MIL/uL — ABNORMAL LOW (ref 4.22–5.81)
RDW: 17 % — AB (ref 11.5–15.5)
WBC: 10.1 10*3/uL (ref 4.0–10.5)

## 2014-06-27 LAB — PROCALCITONIN: Procalcitonin: 0.18 ng/mL

## 2014-06-27 MED ORDER — INSULIN GLARGINE 100 UNIT/ML ~~LOC~~ SOLN
30.0000 [IU] | Freq: Every day | SUBCUTANEOUS | Status: DC
  Administered 2014-06-27: 30 [IU] via SUBCUTANEOUS
  Filled 2014-06-27: qty 0.3

## 2014-06-27 MED ORDER — FUROSEMIDE 10 MG/ML IJ SOLN
20.0000 mg | Freq: Two times a day (BID) | INTRAMUSCULAR | Status: AC
  Administered 2014-06-27 – 2014-06-28 (×4): 20 mg via INTRAVENOUS
  Filled 2014-06-27 (×4): qty 2

## 2014-06-27 MED ORDER — AMLODIPINE BESYLATE 5 MG PO TABS
5.0000 mg | ORAL_TABLET | Freq: Every day | ORAL | Status: DC
  Administered 2014-06-27: 5 mg via ORAL
  Filled 2014-06-27: qty 1

## 2014-06-27 MED ORDER — METOPROLOL TARTRATE 25 MG PO TABS
50.0000 mg | ORAL_TABLET | Freq: Two times a day (BID) | ORAL | Status: DC
  Administered 2014-06-27 (×2): 50 mg via ORAL
  Filled 2014-06-27 (×2): qty 2

## 2014-06-27 NOTE — Progress Notes (Signed)
Pt taken off BIPAP and placed on 40% VM, Pt tolerating well at this time.  Pt has no increased WOB or in any noted distress, RT will leave BIPAP off for now.  RT to monitor and assess as needed.

## 2014-06-27 NOTE — Progress Notes (Signed)
Reason for Consult: Atrial Fibrillation Referring Physician: [facility] Primary Cardiologist: Dr. Claiborne Billings   HPI: [name] [name] is an 80 year old gentleman, followed by Dr. Claiborne Billings, with extensive cardiovascular history. In March 2004 he was found to be in cardiac tamponade as a result of an acute ascending aortic dissection. He underwent emergent pericardiocentesis and surgical repair with grafting of his ascending aorta. In August 2006 he underwent CABG surgery x4 by Dr. Roxan Hockey. He has a history of paroxysmal atrial fibrillation (was on amiodarone and Eliquis prior to admit), neurogenic bladder, hypertension, insulin-dependent diabetes mellitus. In the past, while on Coumadin he did develop significant difficulty leading to multiple episodes of urologic bleeding. He had an AVM repair by Dr. Tommi Rumps at [facility] in 2004 which left him with paresthesias and numbness below the waist such that he has significant impairment in mobility and walks with a walker. He has had development of peripheral vascular disease and had a nonhealing wound on his right heel with Dopplers with Doppler suggesting severe peripheral vascular disease with ABIs of 0.58 on the right and 0.65 on the left. He is status post peripheral intervention of his left external iliac artery and underwent staged diamondback portable rotational atherectomy of a calcified mid right SFA by Dr. Gwenlyn Found. His other PMH includes COPD, stage III CKD, HTN and DM.   He was admitted by [facility] 06/20/14 for acute respiratory failure and confussion with O2 sats as low as 63% on arrival to the ED. In the emergency department, chest x-ray showed bilateral interstitial and airspace opacities with severe emphysema. CT of the chest showed "mixed density surrounding tubular calcification in the ascending thoracic aorta potentially from chronic dissection, chronic mural thrombus, or even intramural hematoma. Some of this appearance could be from an ascending aortic graft". CT of the  abdomen and pelvis was negative for any acute findings. CT of the brain showed a right middle cranial fossa arachnoid cyst versus epidermoid cyst. [place] [address]. 2D echo revealed normal LV function with EF of 50-55%. It was felt that his ARF was likely multifactorial with the possibility of pulmonary edema, pneumonia (HCAP), COPD exacerbation as well as possible pulmonary toxicity. For this reason, amiodarone was discontinued. He has been receiving IV Lasix, antibiotics and bronchodilators.  Since discontinuation of his amiodarone, he has reverted back into atrial fibrillation with a controlled ventricular response. His Eliquis was also discontinued due to h/o falls. Cardiology has been consulted for recommendations for afib management.   His HR is currently in the mid-upper 60s. He is completely asymptomatic with his afib.    Past Medical History  Diagnosis Date  . Peripheral vascular disease     critical limb ischemia  . Myocardial infarction   . Diabetes mellitus without complication   . Hypertension   . Coronary artery disease   . Atrial fibrillation   . Aortic dissection     w/ Cardiac tamponade  . AVM (arteriovenous malformation)   . Murmur   . Hyperlipidemia   . Neuropathy of left lower extremity   . S/P CABG (coronary artery bypass graft)     Past Surgical History  Procedure Laterality Date  . Coronary artery bypass graft  02/11/2005    LIMA to LAD, SVG to 1st diag, SVG to OM1, and SVG to PD  . Back surgery    . Atherectomy  09/15/2012    Diamondback orbital rotation atherectomy was performed using a 2 mm solid crown up to a max of 120,000 RPM.  Angioplasty was performed using a 5x139mm long chocolate balloon for 2 minute inflations. Resulting in reduction of an 80% calcified distal R SFA stenosis to less than 20%  . Lea doppler  10/01/2012    R SFA demonstrated a mild amount of residual plaque suggesting less then 50% diametere reduction. R Calf runoff-one  vessel runoff via peroneal artery. The posterior and anterior tibial arteries appeared occluded.  . Cardiac catheterization  02/04/2005    Severe 3-vessel disease affecting LAD and RCA. Consider CABG  . Cardiovascular stress test  12/16/2006    EKG negative for ischemia. No ECG changes. No significant ischemia demonstrated  . Transthoracic echocardiogram  12/04/2011    EF 50-55%, moderate aortic root dilation  . Atherectomy N/A 09/15/2012    Procedure: ATHERECTOMY;  Surgeon: Lorretta Harp, MD;  Location: [facility] CATH LAB;  Service: Cardiovascular;  Laterality: N/A;    Family History  Problem Relation Age of Onset  . Hypertension Father     Social History:  reports that he quit smoking about 22 years ago. He has never used smokeless tobacco. He reports that he drinks alcohol. He reports that he does not use illicit drugs.  Allergies:  Allergies  Allergen Reactions  . Cymbalta [Duloxetine Hcl] Other (See Comments)    Confused, paralyzed from waist up   . Statins Other (See Comments)    Leg pains    Medications:  Prior to Admission medications   Medication Sig Start Date End Date Taking? Authorizing Provider  amiodarone (PACERONE) 200 MG tablet Take 1 tablet (200 mg total) by mouth daily. Patient taking differently: Take 200 mg by mouth every evening.  12/16/13  Yes Troy Sine, MD  amLODipine (NORVASC) 5 MG tablet Take 1 tablet (5 mg total) by mouth daily. Takes 1/2 tablet daily Patient taking differently: Take 5 mg by mouth daily.  03/31/14  Yes Troy Sine, MD  apixaban (ELIQUIS) 2.5 MG TABS tablet Take 1 tablet (2.5 mg total) by mouth 2 (two) times daily. 10/28/13  Yes Troy Sine, MD  aspirin 81 MG tablet Take 81 mg by mouth daily with breakfast.    Yes Historical Provider, MD  calcium carbonate (OS-CAL - DOSED IN MG OF ELEMENTAL CALCIUM) 1250 MG tablet Take 1 tablet by mouth 2 (two) times daily with a meal.    Yes Historical Provider, MD  doxazosin (CARDURA) 2 MG tablet Take 1.5  tablets (3 mg total) by mouth at bedtime. Patient taking differently: Take 2 mg by mouth at bedtime.  11/30/13  Yes Troy Sine, MD  ezetimibe (ZETIA) 10 MG tablet Take 1 tablet (10 mg total) by mouth daily. Patient taking differently: Take 10 mg by mouth daily with breakfast.  12/29/12  Yes Lorretta Harp, MD  ferrous sulfate 325 (65 FE) MG tablet Take 1 tablet (325 mg total) by mouth daily with breakfast. 05/21/14  Yes Maryann Mikhail, DO  gabapentin (NEURONTIN) 300 MG capsule Take 300-600 mg by mouth See admin instructions. 2 tabs every morning, 1 tab in the afternoon and 2 tabs at bedtime   Yes Historical Provider, MD  insulin glargine (LANTUS) 100 UNIT/ML injection Inject 20 Units into the skin daily with breakfast.    Yes Historical Provider, MD  metoprolol (LOPRESSOR) 50 MG tablet Take 50 mg by mouth 2 (two) times daily. 10/28/13  Yes Troy Sine, MD  MILK THISTLE PO Take 1 tablet by mouth daily with breakfast.   Yes Historical Provider, MD  Multiple Vitamin (MULTIVITAMIN  WITH MINERALS) TABS tablet Take 1 tablet by mouth daily.   Yes Historical Provider, MD  Omega-3 Fatty Acids (FISH OIL PO) Take 1 capsule by mouth 2 (two) times daily.   Yes Historical Provider, MD  oxyCODONE-acetaminophen (PERCOCET/ROXICET) 5-325 MG per tablet Take 1-2 tablets by mouth See admin instructions. 2 tabs every morning, 1 tab in the afternoon and 2 tabs at bedtime   Yes Historical Provider, MD  Probiotic Product (PROBIOTIC DAILY) CAPS Take 1 capsule by mouth every evening.   Yes Historical Provider, MD  levofloxacin (LEVAQUIN) 500 MG tablet Take 1 tablet (500 mg total) by mouth every other day. Patient not taking: Reported on 06/20/2014 05/21/14   Cristal Ford, DO     Results for orders placed or performed during the hospital encounter of 06/20/14 (from the past 48 hour(s))  Glucose, capillary     Status: Abnormal   Collection Time: 06/25/14  5:02 PM  Result Value Ref Range   Glucose-Capillary 242 (H)  70 - 99 mg/dL   Comment 1 Documented in Chart    Comment 2 Notify RN   Glucose, capillary     Status: Abnormal   Collection Time: 06/25/14 10:03 PM  Result Value Ref Range   Glucose-Capillary 292 (H) 70 - 99 mg/dL  Blood gas, arterial     Status: Abnormal   Collection Time: 06/26/14  1:12 AM  Result Value Ref Range   FIO2 1.00 %   Delivery systems BILEVEL POSITIVE AIRWAY PRESSURE    Mode BILEVEL POSITIVE AIRWAY PRESSURE    Inspiratory PAP 10    Expiratory PAP 5    pH, Arterial 7.392 7.350 - 7.450   pCO2 arterial 33.2 (L) 35.0 - 45.0 mmHg   pO2, Arterial 200.0 (H) 80.0 - 100.0 mmHg   Bicarbonate 19.8 (L) 20.0 - 24.0 mEq/L   TCO2 18.9 0 - 100 mmol/L   Acid-base deficit 4.1 (H) 0.0 - 2.0 mmol/L   O2 Saturation 97.3 %   Patient temperature 98.6    Collection site RIGHT RADIAL    Drawn by [id number]    Sample type ARTERIAL DRAW    Allens test (pass/fail) PASS PASS  MRSA PCR Screening     Status: None   Collection Time: 06/26/14  6:53 AM  Result Value Ref Range   MRSA by PCR NEGATIVE NEGATIVE    Comment:        The GeneXpert MRSA Assay (FDA approved for NASAL specimens only), is one component of a comprehensive MRSA colonization surveillance program. It is not intended to diagnose MRSA infection nor to guide or monitor treatment for MRSA infections.   Basic metabolic panel     Status: Abnormal   Collection Time: 06/26/14  8:12 AM  Result Value Ref Range   Sodium 139 135 - 145 mmol/L    Comment: Please note change in reference range.   Potassium 4.4 3.5 - 5.1 mmol/L    Comment: Please note change in reference range.   Chloride 113 (H) 96 - 112 mEq/L   CO2 21 19 - 32 mmol/L   Glucose, Bld 274 (H) 70 - 99 mg/dL   BUN 66 (H) 6 - 23 mg/dL   Creatinine, Ser 1.91 (H) 0.50 - 1.35 mg/dL   Calcium 8.9 8.4 - 10.5 mg/dL   GFR calc non Af Amer 31 (L) >90 mL/min   GFR calc Af Amer 36 (L) >90 mL/min    Comment: (NOTE) The eGFR has been calculated using the CKD EPI equation. This  calculation has not been validated in all clinical situations. eGFR's persistently <90 mL/min signify possible Chronic Kidney Disease.    Anion gap 5 5 - 15  CBC     Status: Abnormal   Collection Time: 06/26/14  8:12 AM  Result Value Ref Range   WBC 11.8 (H) 4.0 - 10.5 K/uL   RBC 3.16 (L) 4.22 - 5.81 MIL/uL   Hemoglobin 9.0 (L) 13.0 - 17.0 g/dL   HCT 28.8 (L) 39.0 - 52.0 %   MCV 91.1 78.0 - 100.0 fL   MCH 28.5 26.0 - 34.0 pg   MCHC 31.3 30.0 - 36.0 g/dL   RDW 16.7 (H) 11.5 - 15.5 %   Platelets 257 150 - 400 K/uL  Procalcitonin - Baseline     Status: None   Collection Time: 06/26/14  8:12 AM  Result Value Ref Range   Procalcitonin 0.22 ng/mL    Comment:        Interpretation: PCT (Procalcitonin) <= 0.5 ng/mL: Systemic infection (sepsis) is not likely. Local bacterial infection is possible. (NOTE)         ICU PCT Algorithm               Non ICU PCT Algorithm    ----------------------------     ------------------------------         PCT < 0.25 ng/mL                 PCT < 0.1 ng/mL     Stopping of antibiotics            Stopping of antibiotics       strongly encouraged.               strongly encouraged.    ----------------------------     ------------------------------       PCT level decrease by               PCT < 0.25 ng/mL       >= 80% from peak PCT       OR PCT 0.25 - 0.5 ng/mL          Stopping of antibiotics                                             encouraged.     Stopping of antibiotics           encouraged.    ----------------------------     ------------------------------       PCT level decrease by              PCT >= 0.25 ng/mL       < 80% from peak PCT        AND PCT >= 0.5 ng/mL            Continuin g antibiotics                                              encouraged.       Continuing antibiotics            encouraged.    ----------------------------     ------------------------------     PCT level increase compared          PCT > 0.5  ng/mL         with  peak PCT AND          PCT >= 0.5 ng/mL             Escalation of antibiotics                                          strongly encouraged.      Escalation of antibiotics        strongly encouraged.   Glucose, capillary     Status: Abnormal   Collection Time: 06/26/14  8:29 AM  Result Value Ref Range   Glucose-Capillary 254 (H) 70 - 99 mg/dL   Comment 1 Documented in Chart    Comment 2 Notify RN   Glucose, capillary     Status: Abnormal   Collection Time: 06/26/14 12:25 PM  Result Value Ref Range   Glucose-Capillary 174 (H) 70 - 99 mg/dL   Comment 1 Documented in Chart    Comment 2 Notify RN   Glucose, capillary     Status: Abnormal   Collection Time: 06/26/14  5:12 PM  Result Value Ref Range   Glucose-Capillary 181 (H) 70 - 99 mg/dL  Urinalysis, Routine w reflex microscopic     Status: Abnormal   Collection Time: 06/26/14  6:23 PM  Result Value Ref Range   Color, Urine YELLOW YELLOW   APPearance CLEAR CLEAR   Specific Gravity, Urine 1.008 1.005 - 1.030   pH 5.0 5.0 - 8.0   Glucose, UA NEGATIVE NEGATIVE mg/dL   Hgb urine dipstick NEGATIVE NEGATIVE   Bilirubin Urine NEGATIVE NEGATIVE   Ketones, ur NEGATIVE NEGATIVE mg/dL   Protein, ur NEGATIVE NEGATIVE mg/dL   Urobilinogen, UA 0.2 0.0 - 1.0 mg/dL   Nitrite NEGATIVE NEGATIVE   Leukocytes, UA TRACE (A) NEGATIVE  Urine microscopic-add on     Status: None   Collection Time: 06/26/14  6:23 PM  Result Value Ref Range   WBC, UA 0-2 <3 WBC/hpf   RBC / HPF 0-2 <3 RBC/hpf   Urine-Other RARE YEAST   Glucose, capillary     Status: Abnormal   Collection Time: 06/26/14  9:27 PM  Result Value Ref Range   Glucose-Capillary 226 (H) 70 - 99 mg/dL  Basic metabolic panel     Status: Abnormal   Collection Time: 06/27/14  3:42 AM  Result Value Ref Range   Sodium 142 135 - 145 mmol/L    Comment: Please note change in reference range.   Potassium 4.1 3.5 - 5.1 mmol/L    Comment: Please note change in reference range.   Chloride 111 96 -  112 mEq/L   CO2 23 19 - 32 mmol/L   Glucose, Bld 314 (H) 70 - 99 mg/dL   BUN 72 (H) 6 - 23 mg/dL   Creatinine, Ser 7.78 (H) 0.50 - 1.35 mg/dL   Calcium 9.1 8.4 - 24.2 mg/dL   GFR calc non Af Amer 30 (L) >90 mL/min   GFR calc Af Amer 34 (L) >90 mL/min    Comment: (NOTE) The eGFR has been calculated using the CKD EPI equation. This calculation has not been validated in all clinical situations. eGFR's persistently <90 mL/min signify possible Chronic Kidney Disease.    Anion gap 8 5 - 15  CBC     Status: Abnormal   Collection Time: 06/27/14  3:42 AM  Result Value Ref Range   WBC 10.1 4.0 - 10.5 K/uL   RBC 3.24 (L) 4.22 - 5.81 MIL/uL   Hemoglobin 9.0 (L) 13.0 - 17.0 g/dL   HCT 29.1 (L) 39.0 - 52.0 %   MCV 89.8 78.0 - 100.0 fL   MCH 27.8 26.0 - 34.0 pg   MCHC 30.9 30.0 - 36.0 g/dL   RDW 17.0 (H) 11.5 - 15.5 %   Platelets 259 150 - 400 K/uL  Procalcitonin     Status: None   Collection Time: 06/27/14  3:43 AM  Result Value Ref Range   Procalcitonin 0.18 ng/mL    Comment:        Interpretation: PCT (Procalcitonin) <= 0.5 ng/mL: Systemic infection (sepsis) is not likely. Local bacterial infection is possible. (NOTE)         ICU PCT Algorithm               Non ICU PCT Algorithm    ----------------------------     ------------------------------         PCT < 0.25 ng/mL                 PCT < 0.1 ng/mL     Stopping of antibiotics            Stopping of antibiotics       strongly encouraged.               strongly encouraged.    ----------------------------     ------------------------------       PCT level decrease by               PCT < 0.25 ng/mL       >= 80% from peak PCT       OR PCT 0.25 - 0.5 ng/mL          Stopping of antibiotics                                             encouraged.     Stopping of antibiotics           encouraged.    ----------------------------     ------------------------------       PCT level decrease by              PCT >= 0.25 ng/mL       < 80%  from peak PCT        AND PCT >= 0.5 ng/mL            Continuin g antibiotics                                              encouraged.       Continuing antibiotics            encouraged.    ----------------------------     ------------------------------     PCT level increase compared          PCT > 0.5 ng/mL         with peak PCT AND          PCT >= 0.5 ng/mL             Escalation of antibiotics  strongly encouraged.      Escalation of antibiotics        strongly encouraged.   Glucose, capillary     Status: Abnormal   Collection Time: 06/27/14  8:01 AM  Result Value Ref Range   Glucose-Capillary 275 (H) 70 - 99 mg/dL   Comment 1 Documented in Chart    Comment 2 Notify RN   Glucose, capillary     Status: Abnormal   Collection Time: 06/27/14 12:36 PM  Result Value Ref Range   Glucose-Capillary 318 (H) 70 - 99 mg/dL   Comment 1 Documented in Chart    Comment 2 Notify RN     Dg Chest Port 1 View  06/27/2014   CLINICAL DATA:  Acute respiratory failure.  EXAM: PORTABLE CHEST - 1 VIEW  COMPARISON:  06/25/2014, 06/23/2014, 03/29/2013.  CT 06/20/2014.  FINDINGS: Mediastinum hilar structures normal. Prior CABG. Stable cardiomegaly with normal pulmonary vascularity. Persistent dense bilateral pulmonary interstitial infiltrates. Known right upper lobe pulmonary nodule better characterized by recent CT. No pleural effusion or pneumothorax. No acute osseus abnormality.  IMPRESSION: 1. Persistent dense bilateral pulmonary interstitial infiltrates. 2. Known right upper lobe pulmonary nodule better characterized by CT of 06/20/2014. 3. Prior CABG.  Heart size is stable.  Pulmonary vascularity normal.   Electronically Signed   By: Morehouse   On: 06/27/2014 07:19   Dg Chest Port 1 View  06/25/2014   CLINICAL DATA:  Acute onset of hypoxia and worsening shortness of breath. Initial encounter.  EXAM: PORTABLE CHEST - 1 VIEW  COMPARISON:  Chest radiograph  performed 06/23/2014, and CT of the chest performed 06/20/2014  FINDINGS: There is mildly worsened diffuse interstitial prominence, raising suspicion for mild pulmonary edema or atypical pneumonia, superimposed on the patient's chronic emphysema, fibrosis and interstitial lung disease. The known right upper lobe pulmonary nodule is better characterized on recent CT. No definite pleural effusion or pneumothorax is seen.  The cardiomediastinal silhouette remains normal in size. The patient is status post median sternotomy, with evidence of prior CABG. No acute osseous abnormalities are seen.  IMPRESSION: 1. Mildly worsened diffuse interstitial prominence, raising suspicion for mild pulmonary edema or atypical pneumonia, superimposed on the patient's chronic emphysema, fibrosis and interstitial lung disease. 2. Known right upper lobe pulmonary nodule is better characterized on recent CT.   Electronically Signed   By: Garald Balding M.D.   On: 06/25/2014 21:53    Review of Systems  Respiratory: Positive for shortness of breath.   Cardiovascular: Negative for chest pain, palpitations and leg swelling.  Musculoskeletal: Positive for falls.  Neurological: Negative for dizziness and loss of consciousness.  All other systems reviewed and are negative.  Blood pressure 165/42, pulse 69, temperature 97.5 F (36.4 C), temperature source Oral, resp. rate 19, height 6' 0.05" (1.83 m), weight 159 lb 13.3 oz (72.5 kg), SpO2 96 %. Physical Exam  Constitutional: He is oriented to person, place, and time. He appears well-developed and well-nourished. No distress.  Neck: No JVD present.  Cardiovascular: Normal heart sounds and intact distal pulses.  An irregularly irregular rhythm present. Exam reveals no gallop and no friction rub.   No murmur heard. Pulses:      Radial pulses are 2+ on the right side, and 2+ on the left side.       Dorsalis pedis pulses are 2+ on the right side, and 2+ on the left side.    Respiratory: Effort normal. No respiratory distress. He has no wheezes. Rales: left  basilar.  Musculoskeletal: He exhibits no edema.  Neurological: He is alert and oriented to person, place, and time.  Skin: Skin is warm and dry. He is not diaphoretic.  Psychiatric: He has a normal mood and affect. His behavior is normal.    Assessment/Plan: Active Problems:   CAD - CABG x 4 in 2006   PAF - not on chronic anticoagulation due to high bleeding risk   HTN (hypertension)   Neurogenic bladder   Acute respiratory failure   CKD (chronic kidney disease) stage 3, GFR 30-59 ml/min   Chronic obstructive pulmonary disease with acute exacerbation   Urinary tract infectious disease   Pulmonary fibrosis   Centrilobular emphysema   Abnormal CXR (chest x-ray)  1. Atrial Fibrillation: Amiodarone was discontinued recently due to concerns for pulmonary toxicity. He unfortunately reverted back from SR to afib. Luckily his ventricular rate is well controlled in the mid-upper 60s and he is asymptomatic. He is currently on 50 mg of Lopressor BID. Since rate is well controlled and he is completely asymptomatic, would recommend continuation of rate control strategy w/ BB. Can further increase or add Cardizem as an OP if additional rate control is need.   Agree with stopping Eliquis due to increased fall risk.  Continue daily ASA.   2. Acute on Chronic Diastolic CHF: TRH managing. EF 50-55%. Continue IV Lasix. Daily weights and low sodium diet.   SIMMONS, BRITTAINY 06/27/2014, 2:12 PM    I have seen and examined the patient along with SIMMONS, BRITTAINY, PA.  I have reviewed the chart, notes and new data.  I agree with PA's note.  Diagnosis of amiodarone lung is not certain, but at this point amiodarone related risk appears to outweigh benefits. He feels no different in AF with controlled rate than he did in NSR.  Recommend rate control strategy (as amiodarone effect resolves slowly he will probably need a  new rate control med introduced) and resume anticoagulants for embolic stroke prevention.  Sanda Klein, MD, [place] [phone] 06/27/2014, 4:27 PM

## 2014-06-27 NOTE — Progress Notes (Signed)
PROGRESS NOTE  [name] [name] ZOX:[id number] DOB: 12/27/1928 DOA: 06/20/2014 PCP: [name],[name], MD  Brief history [age] year old male with a history of COPD, paroxysmal A. fib, CKD stage III, diabetes mellitus, hypertension, thoracic aortic aneurysm repair presents with 3 day history of worsening shortness of breath. Notably, the patient was recently admitted to the hospital from November 18 through 05/21/2014 for pneumonia. The patient was discharged home with levofloxacin. The patient presented to his primary care physician's office on the day of admission when he was noted to have oxygen saturation of 80%. He was sent to the emergency department. In the emergency department, the patient was noted to have oxygen saturation of 63% on room air. CT of the chest showed "mixed density surrounding tubular calcification in the ascending thoracic aorta potentially from chronic dissection, chronic muralthrombus, or even intramural hematoma. Some of this appearance could be from an ascending aortic graft". CT of the abdomen and pelvis was negative for any acute findings, but it does show prominent stool.  The patient was started on intravenous antibiotics, furosemide but did not clinically improve. 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 Amiodarone was discontinued, and the patient's atrial fibrillation has remained rate controlled. Unfortunately, the patient developed respiratory distress during the night on 06/25/2014. Chest x-ray and exam suggested the patient developed fluid overload and he was started on intravenous furosemide with clinical improvement. Assessment/Plan: Acute respiratory failure -Likely multifactorial with the possibility of pulmonary edema, pneumonia (HCAP), and consideration for underlying ILD or BOOP/ Amiodarone pulmonary toxicity -06/25/14 evening-patient developed respiratory distress -06/26/2014 chest  x-ray--worsening diffuse interstitial infiltrates -06/25/2014--intravenous Solu-Medrol restarted by pulmonary - at time of admission, The patient did not have any significant clinical improvement with intravenous furosemide x 4 doses or abx -appreciate pulmonary followup -Recheck BMP, CBC, procalcitonin  -06/26/14 Redose furosemide IV x 3-->20mg , 40mg , 40 mg-->put out 6L with improving renal function Pulmonary opacities/infiltrates -No significant clinical improvement with intravenous furosemide 4 doses initially at time of admission -since no clear signs of lung infection will narrow antibiotics to cover only UTI -Blood cultures 2 sets--Unfortunately, this was not initially ordered, and Zosyn was given prior to collection of the blood cultures -check procalcitonin-->1.33-->0.53-->0.18 -Continue bronchodilators -06/26/2014 chest x-ray shows worsening bilateral interstitial opacities--> redosed intravenous furosemide  Elevated proBNP/Acute diastolic CHF  -Although the patient has CKD stage III which may elevate BNP, this is more elevated than his prior admission  -Although the patient has some JVD on examination, he does not appear to be floridly fluid overloaded  -11/02/13 Echo did not show any RV dysfunction, TR, or pulm HTN  -06/21/2014-- echocardiogram EF 50-55%, increased EDP, mild TR, PAP 41 -I/O's, daily weights -low sodium diet -06/26/2014--> diuresed 6 L -Decrease furosemide to 20 mg IV twice a day Paroxysmal Atrial fibrillation  -Rate controlled  -back in Afib after initial sinus -Discontinue apixiban given the patient's recent history of multiple falls  -continue aspirin  -discontinue amiodarone, due to concerns for pulmonary toxicity -consult cardiology Pyuria -TNTC WBC in urine  -continue zosyn; will narrow to ceftin for 5 more days to complete tx for UTI -unfortunately, urine culture was not sent from ED -Today is day #8 of antibiotics   Acute on CKD stage III   -Baseline creatinine 1.7-2.1  -due to diuresis and presumed UTI -Cr improved to 1.97 -follow renal function trend  Altered mental status -likely due to infection/hypoxemia -resolved now -The patient  is now A&Ox3  -CT brain neg for acute findings  COPD  -continue bronchodilators -no wheezing presently -per PCCM will continue steroids for potential ILD    Diabetes mellitus type 2  -05/18/2014 hemoglobin A1c 6.5  -CBGs elevated secondary to steroids, increase Lantus to 25 units  -NovoLog sliding scale  -will adjust coverage as needed while on steroids  Hypertension  -Continue metoprolol tartrate--increase to 50 mg twice a day -Hold Cardura -will continue holding lasix given recent worsening on renal function  coronary artery disease  -Patient had CABG in 2006 -Currently chest pain-free -Continue metoprolol tartrate   Thoracic Aortic Aneursym -s/p repair -changes on CT likely due to previous repair -continue outpatient follow up  Constipation -colace and bisacodyl -will use enema if no improvement  Family Communication: wife updated at bedside Disposition Plan: Home when medically stable           Procedures/Studies: Ct Abdomen Pelvis Wo Contrast  06/20/2014   CLINICAL DATA:  Fall in bathroom. Anti coagulation. Increased confusion.  EXAM: CT CHEST, ABDOMEN AND PELVIS WITHOUT CONTRAST  TECHNIQUE: Multidetector CT imaging of the chest, abdomen and pelvis was performed following the standard protocol without IV contrast.  COMPARISON:  Multiple exams, including 05/19/2014 and 03/26/2005  FINDINGS: CT CHEST FINDINGS  Right upper paratracheal node 1.2 cm in short axis, image 12 series 2. Subcarinal node 1.4 cm in short axis, image 34 series 2. Additional paratracheal nodes are present. If hilar nodes are enlarged there difficult to measure against the background vasculature.  Aortic and branch vessel atherosclerosis. Prior CABG. Ascending thoracic aorta 4.6  cm transverse ; descending thoracic aorta 4.6 cm transverse. Lower descending thoracic aorta 5.0 cm transverse. There is mixed density surrounding tubular calcification in the ascending thoracic aorta potentially from chronic dissection, chronic mural thrombus, or even intramural hematoma. Some of this appearance could be from an ascending aortic graft-correlate with patient history. Without IV contrast did is difficult to characterize this further.  Trace left pleural effusion. Mild enlargement of the cardiopericardial silhouette  Emphysema is present with scattered scarring and peripheral fibrosis potentially with some early honeycombing at the lung bases. In addition, there is a 2.4 by 1.4 cm nodule anteriorly in the right upper lobe, image 25 of series 5 interstitial accentuation is present bilaterally.  CT ABDOMEN AND PELVIS FINDINGS  Hepatobiliary: Unremarkable  Pancreas: Speckled calcifications throughout the pancreatic parenchyma compatible with chronic calcific pancreatitis.  Spleen: Unremarkable  Adrenals/Urinary Tract: 8 mm left kidney lower pole nonobstructive calculus. Bilateral fluid density renal lesions favoring cysts. No hydronephrosis or hydroureter. No ureteral calculi.  Stomach/Bowel: Prominent stool throughout the colon favors constipation.  Vascular/Lymphatic: Dense aortoiliac atherosclerosis.  Reproductive: Enlarged prostate gland with scattered calcifications, measuring 5.8 by 4.6 cm.  Other: No supplemental non-categorized findings.  Musculoskeletal: Bridging fusion of the sacroiliac joints. posterolateral rod and pedicle screw fixation at L4-5 with interbody and anterior facet fusion. Intervertebral and facet spurring at L3-4 and L5-S1. Small bilateral inguinal hernias contain adipose tissue.  IMPRESSION: 1. 2.4 by 1.4 cm right upper lobe pulmonary nodule with right paratracheal adenopathy, concerning for malignancy. An atypical infectious process could potentially give this appearance.  Consider nuclear medicine PET-CT. 2. Severe emphysema with peripheral fibrosis, interstitial accentuation, and some basilar honeycombing. 3. Ascending and descending thoracic aortic aneurysms measuring up to 5 cm transversely. There is a mixed density in the ascending aortic aneurysm with a complex appearance, possibly from graft material and excluded thrombus, chronic mural thrombus, chronic dissection, or intramural hematoma.  Correlate with operative history. If IV contrast could not be administered, MRI might help further assess the thoracic aorta. Otherwise I recommend semi-annual imaging followup by CTA or MRA and referral to cardiothoracic surgery if not already obtained. This recommendation follows 2010 ACCF/AHA/AATS/ACR/ASA/SCA/SCAI/SIR/STS/SVM Guidelines for the Diagnosis and Management of Patients With Thoracic Aortic Disease. Circulation. 2010; 121: U045-W098. 4. Trace left pleural effusion. 5. Chronic calcific pancreatitis. 6.  Prominent stool throughout the colon favors constipation. 7. Enlarged prostate gland. 8. Lumbar spondylosis particularly at L3-4 and L5-S1.   Electronically Signed   By: Herbie Baltimore M.D.   On: 06/20/2014 13:58   Ct Head Wo Contrast  06/20/2014   CLINICAL DATA:  Fall.  Hypoxia.  EXAM: CT HEAD WITHOUT CONTRAST  CT CERVICAL SPINE WITHOUT CONTRAST  TECHNIQUE: Multidetector CT imaging of the head and cervical spine was performed following the standard protocol without intravenous contrast. Multiplanar CT image reconstructions of the cervical spine were also generated.  COMPARISON:  Report from 11/24/2000  FINDINGS: CT HEAD FINDINGS  Hypodense lesion in the right middle cranial fossa tracking up into the sylvian fissure, fluid density, probably an arachnoid cyst or epidermoid. Size 4.0 by 3.2 by approximately 6.0 cm.  Atherosclerosis noted. The brainstem, cerebellum, cerebral peduncle sits, thalami, and basal ganglia appear unremarkable. The ventricular system appears normal  and aside from the right middle cranial fossa lesion the basilar cisterns appear normal.  Periventricular white matter and corona radiata hypodensities favor chronic ischemic microvascular white matter disease. No intracranial hemorrhage, mass lesion, or acute CVA. There is a subcutaneous lesion along the occiput/upper neck in the midline on image 1 of series 3 which could represent subcutaneous bruising. Chronic left frontal sinusitis.  CT CERVICAL SPINE FINDINGS  Considerable degenerative findings at the craniocervical junction and anterior arch of C1-2 with extensive spurring of the basion and pannus formation posterior to the odontoid. Prominent loss of articular space at the anterior C1-2 articulation.  Uncinate and facet spurring cause osseous foraminal stenosis on the right at C2-3 and on the left at C3-4, C4-5, and C6-7. There is interbody fusion at the C5-6 low-level and facet fusion on the left at C4-5 and C7-T1.  There is 2 mm degenerative anterolisthesis at C4-5 and at C7-T1. Degenerative endplate sclerosis noted with posterior osseous ridging at C6-7, C7-T1, and T1-2.  No prevertebral soft tissue swelling. No acute fracture is identified. There is dextroconvex cervical scoliosis.  Prominent emphysema and scarring noted at the lung apices.  IMPRESSION: 1. Right middle cranial fossa arachnoid cyst or epidermoid tracking into the sylvian fissure. MRI can typically differentiate between these 2 entities if clinically warranted. 2. Atherosclerosis. 3. Dense subcutaneous lesion along the occiput but and upper neck could represent some focal bruising. 4. Chronic left frontal sinusitis. 5. Cervical spondylosis causes multilevel osseous foraminal impingement. 6. Considerable spurring at the C1- 2 articulation and along the basion, with pannus formation posterior to the odontoid-rheumatoid arthropathy not excluded. 7. Emphysema.   Electronically Signed   By: Herbie Baltimore M.D.   On: 06/20/2014 13:36   Ct  Chest Wo Contrast  06/20/2014   CLINICAL DATA:  Fall in bathroom. Anti coagulation. Increased confusion.  EXAM: CT CHEST, ABDOMEN AND PELVIS WITHOUT CONTRAST  TECHNIQUE: Multidetector CT imaging of the chest, abdomen and pelvis was performed following the standard protocol without IV contrast.  COMPARISON:  Multiple exams, including 05/19/2014 and 03/26/2005  FINDINGS: CT CHEST FINDINGS  Right upper paratracheal node 1.2 cm in short axis, image 12 series 2.  Subcarinal node 1.4 cm in short axis, image 34 series 2. Additional paratracheal nodes are present. If hilar nodes are enlarged there difficult to measure against the background vasculature.  Aortic and branch vessel atherosclerosis. Prior CABG. Ascending thoracic aorta 4.6 cm transverse ; descending thoracic aorta 4.6 cm transverse. Lower descending thoracic aorta 5.0 cm transverse. There is mixed density surrounding tubular calcification in the ascending thoracic aorta potentially from chronic dissection, chronic mural thrombus, or even intramural hematoma. Some of this appearance could be from an ascending aortic graft-correlate with patient history. Without IV contrast did is difficult to characterize this further.  Trace left pleural effusion. Mild enlargement of the cardiopericardial silhouette  Emphysema is present with scattered scarring and peripheral fibrosis potentially with some early honeycombing at the lung bases. In addition, there is a 2.4 by 1.4 cm nodule anteriorly in the right upper lobe, image 25 of series 5 interstitial accentuation is present bilaterally.  CT ABDOMEN AND PELVIS FINDINGS  Hepatobiliary: Unremarkable  Pancreas: Speckled calcifications throughout the pancreatic parenchyma compatible with chronic calcific pancreatitis.  Spleen: Unremarkable  Adrenals/Urinary Tract: 8 mm left kidney lower pole nonobstructive calculus. Bilateral fluid density renal lesions favoring cysts. No hydronephrosis or hydroureter. No ureteral calculi.   Stomach/Bowel: Prominent stool throughout the colon favors constipation.  Vascular/Lymphatic: Dense aortoiliac atherosclerosis.  Reproductive: Enlarged prostate gland with scattered calcifications, measuring 5.8 by 4.6 cm.  Other: No supplemental non-categorized findings.  Musculoskeletal: Bridging fusion of the sacroiliac joints. posterolateral rod and pedicle screw fixation at L4-5 with interbody and anterior facet fusion. Intervertebral and facet spurring at L3-4 and L5-S1. Small bilateral inguinal hernias contain adipose tissue.  IMPRESSION: 1. 2.4 by 1.4 cm right upper lobe pulmonary nodule with right paratracheal adenopathy, concerning for malignancy. An atypical infectious process could potentially give this appearance. Consider nuclear medicine PET-CT. 2. Severe emphysema with peripheral fibrosis, interstitial accentuation, and some basilar honeycombing. 3. Ascending and descending thoracic aortic aneurysms measuring up to 5 cm transversely. There is a mixed density in the ascending aortic aneurysm with a complex appearance, possibly from graft material and excluded thrombus, chronic mural thrombus, chronic dissection, or intramural hematoma. Correlate with operative history. If IV contrast could not be administered, MRI might help further assess the thoracic aorta. Otherwise I recommend semi-annual imaging followup by CTA or MRA and referral to cardiothoracic surgery if not already obtained. This recommendation follows 2010 ACCF/AHA/AATS/ACR/ASA/SCA/SCAI/SIR/STS/SVM Guidelines for the Diagnosis and Management of Patients With Thoracic Aortic Disease. Circulation. 2010; 121: Z610-R604. 4. Trace left pleural effusion. 5. Chronic calcific pancreatitis. 6.  Prominent stool throughout the colon favors constipation. 7. Enlarged prostate gland. 8. Lumbar spondylosis particularly at L3-4 and L5-S1.   Electronically Signed   By: Herbie Baltimore M.D.   On: 06/20/2014 13:58   Ct Cervical Spine Wo  Contrast  06/20/2014   CLINICAL DATA:  Fall.  Hypoxia.  EXAM: CT HEAD WITHOUT CONTRAST  CT CERVICAL SPINE WITHOUT CONTRAST  TECHNIQUE: Multidetector CT imaging of the head and cervical spine was performed following the standard protocol without intravenous contrast. Multiplanar CT image reconstructions of the cervical spine were also generated.  COMPARISON:  Report from 11/24/2000  FINDINGS: CT HEAD FINDINGS  Hypodense lesion in the right middle cranial fossa tracking up into the sylvian fissure, fluid density, probably an arachnoid cyst or epidermoid. Size 4.0 by 3.2 by approximately 6.0 cm.  Atherosclerosis noted. The brainstem, cerebellum, cerebral peduncle sits, thalami, and basal ganglia appear unremarkable. The ventricular system appears normal and aside from the right  middle cranial fossa lesion the basilar cisterns appear normal.  Periventricular white matter and corona radiata hypodensities favor chronic ischemic microvascular white matter disease. No intracranial hemorrhage, mass lesion, or acute CVA. There is a subcutaneous lesion along the occiput/upper neck in the midline on image 1 of series 3 which could represent subcutaneous bruising. Chronic left frontal sinusitis.  CT CERVICAL SPINE FINDINGS  Considerable degenerative findings at the craniocervical junction and anterior arch of C1-2 with extensive spurring of the basion and pannus formation posterior to the odontoid. Prominent loss of articular space at the anterior C1-2 articulation.  Uncinate and facet spurring cause osseous foraminal stenosis on the right at C2-3 and on the left at C3-4, C4-5, and C6-7. There is interbody fusion at the C5-6 low-level and facet fusion on the left at C4-5 and C7-T1.  There is 2 mm degenerative anterolisthesis at C4-5 and at C7-T1. Degenerative endplate sclerosis noted with posterior osseous ridging at C6-7, C7-T1, and T1-2.  No prevertebral soft tissue swelling. No acute fracture is identified. There is  dextroconvex cervical scoliosis.  Prominent emphysema and scarring noted at the lung apices.  IMPRESSION: 1. Right middle cranial fossa arachnoid cyst or epidermoid tracking into the sylvian fissure. MRI can typically differentiate between these 2 entities if clinically warranted. 2. Atherosclerosis. 3. Dense subcutaneous lesion along the occiput but and upper neck could represent some focal bruising. 4. Chronic left frontal sinusitis. 5. Cervical spondylosis causes multilevel osseous foraminal impingement. 6. Considerable spurring at the C1- 2 articulation and along the basion, with pannus formation posterior to the odontoid-rheumatoid arthropathy not excluded. 7. Emphysema.   Electronically Signed   By: Herbie Baltimore M.D.   On: 06/20/2014 13:36   Dg Chest Port 1 View  06/27/2014   CLINICAL DATA:  Acute respiratory failure.  EXAM: PORTABLE CHEST - 1 VIEW  COMPARISON:  06/25/2014, 06/23/2014, 03/29/2013.  CT 06/20/2014.  FINDINGS: Mediastinum hilar structures normal. Prior CABG. Stable cardiomegaly with normal pulmonary vascularity. Persistent dense bilateral pulmonary interstitial infiltrates. Known right upper lobe pulmonary nodule better characterized by recent CT. No pleural effusion or pneumothorax. No acute osseus abnormality.  IMPRESSION: 1. Persistent dense bilateral pulmonary interstitial infiltrates. 2. Known right upper lobe pulmonary nodule better characterized by CT of 06/20/2014. 3. Prior CABG.  Heart size is stable.  Pulmonary vascularity normal.   Electronically Signed   By: Maisie Fus  Register   On: 06/27/2014 07:19   Dg Chest Port 1 View  06/25/2014   CLINICAL DATA:  Acute onset of hypoxia and worsening shortness of breath. Initial encounter.  EXAM: PORTABLE CHEST - 1 VIEW  COMPARISON:  Chest radiograph performed 06/23/2014, and CT of the chest performed 06/20/2014  FINDINGS: There is mildly worsened diffuse interstitial prominence, raising suspicion for mild pulmonary edema or atypical  pneumonia, superimposed on the patient's chronic emphysema, fibrosis and interstitial lung disease. The known right upper lobe pulmonary nodule is better characterized on recent CT. No definite pleural effusion or pneumothorax is seen.  The cardiomediastinal silhouette remains normal in size. The patient is status post median sternotomy, with evidence of prior CABG. No acute osseous abnormalities are seen.  IMPRESSION: 1. Mildly worsened diffuse interstitial prominence, raising suspicion for mild pulmonary edema or atypical pneumonia, superimposed on the patient's chronic emphysema, fibrosis and interstitial lung disease. 2. Known right upper lobe pulmonary nodule is better characterized on recent CT.   Electronically Signed   By: Roanna Raider M.D.   On: 06/25/2014 21:53   Dg Chest [facility] [address]  06/23/2014   CLINICAL DATA:  Acute respiratory failure, hypoxia  EXAM: PORTABLE CHEST - 1 VIEW  COMPARISON:  06/20/2014  FINDINGS: Cardiomediastinal silhouette is stable. Again noted reticular diffuse interstitial prominence bilaterally probable due to chronic interstitial lung disease and fibrotic changes. No definite superimposed infiltrate or pulmonary edema. Poorly visualized nodule in right upper lobe again noted measures about 2 cm. This was better visualized on recent CT scan.  IMPRESSION: Again noted diffuse reticular interstitial prominence probable due to chronic interstitial lung disease and fibrotic changes. No definite superimposed infiltrate. Again noted nodule in right upper lobe medially. This was better visualized on recent CT scan.   Electronically Signed   By: Natasha MeadLiviu  Pop M.D.   On: 06/23/2014 11:01   Dg Chest Portable 1 View  06/20/2014   CLINICAL DATA:  Fall.  Pneumonia.  Shortness of breath.  Confusion.  EXAM: PORTABLE CHEST - 1 VIEW  COMPARISON:  05/19/2014  FINDINGS: Diffuse interstitial and patchy airspace opacities are present superimposed on emphysema. Atherosclerotic aortic arch and prior  CABG. Heart size within normal limits for projection. Stable biapical pleural parenchymal scarring. No pneumothorax observed. No definite blunting of the costophrenic angles.  IMPRESSION: 1. Bilateral interstitial and airspace opacities, worsened, superimposed on severe emphysema. Appearance could reflect edema or atypical pneumonia.   Electronically Signed   By: Herbie BaltimoreWalt  Liebkemann M.D.   On: 06/20/2014 12:09         Subjective: Patient is breathing better today. He is on BiPAP. Denies fevers, chills, chest pain, short of breath, nausea, vomiting, diarrhea, pain, headache.  Objective: Filed Vitals:   06/27/14 0800 06/27/14 0900 06/27/14 1000 06/27/14 1100  BP: 180/44 199/44 203/67 176/63  Pulse: 80 75 74 85  Temp: 97.7 F (36.5 C)     TempSrc: Oral     Resp: 16 16 14 25   Height:      Weight:      SpO2: 88% 93% 97% 95%    Intake/Output Summary (Last 24 hours) at 06/27/14 1139 Last data filed at 06/27/14 1100  Gross per 24 hour  Intake    380 ml  Output   7200 ml  Net  -6820 ml   Weight change: -7.4 kg (-16 lb 5 oz) Exam:   General:  Pt is alert, follows commands appropriately, not in acute distress  HEENT: No icterus, No thrush, [place]/AT  Cardiovascular: RRR, S1/S2, no rubs, no gallops  Respiratory: Bilateral crackles. No wheezing.  Abdomen: Soft/+BS, non tender, non distended, no guarding  Extremities: trace le edema, No lymphangitis, No petechiae, No rashes, no synovitis  Data Reviewed: Basic Metabolic Panel:  Recent Labs Lab 06/22/14 0515 06/23/14 0420 06/25/14 0502 06/26/14 0812 06/27/14 0342  NA 137 140 138 139 142  K 4.1 4.5 4.7 4.4 4.1  CL 107 110 112 113* 111  CO2 23 23 21 21 23   GLUCOSE 175* 87 383* 274* 314*  BUN 59* 66* 76* 66* 72*  CREATININE 3.22* 3.46* 2.35* 1.91* 1.97*  CALCIUM 8.0* 8.5 8.4 8.9 9.1   Liver Function Tests:  Recent Labs Lab 06/20/14 1201  AST 26  ALT 15  ALKPHOS 85  BILITOT 1.0  PROT 8.2  ALBUMIN 3.3*   No results  for input(s): LIPASE, AMYLASE in the last 168 hours. No results for input(s): AMMONIA in the last 168 hours. CBC:  Recent Labs Lab 06/20/14 1201 06/21/14 0455 06/22/14 0515 06/25/14 0502 06/26/14 0812 06/27/14 0342  WBC 11.8* 10.5 9.1 9.9 11.8* 10.1  NEUTROABS 9.9*  --   --   --   --   --  HGB 10.0* 9.0* 8.4* 7.9* 9.0* 9.0*  HCT 32.7* 29.4* 28.2* 25.7* 28.8* 29.1*  MCV 94.2 94.5 96.2 92.8 91.1 89.8  PLT 211 197 210 220 257 259   Cardiac Enzymes:  Recent Labs Lab 06/20/14 1201  TROPONINI <0.30   BNP: Invalid input(s): POCBNP CBG:  Recent Labs Lab 06/26/14 0829 06/26/14 1225 06/26/14 1712 06/26/14 2127 06/27/14 0801  GLUCAP 254* 174* 181* 226* 275*    Recent Results (from the past 240 hour(s))  Culture, blood (routine x 2)     Status: None   Collection Time: 06/20/14  5:42 PM  Result Value Ref Range Status   Specimen Description BLOOD RIGHT ARM  Final   Special Requests BOTTLES DRAWN AEROBIC AND ANAEROBIC 10CC  Final   Culture  Setup Time   Final    06/21/2014 02:53 Performed at [facility]    Culture   Final    NO GROWTH 5 DAYS Performed at [facility]    Report Status 06/27/2014 FINAL  Final  Culture, blood (routine x 2)     Status: None   Collection Time: 06/20/14  5:50 PM  Result Value Ref Range Status   Specimen Description BLOOD LEFT HAND  Final   Special Requests BOTTLES DRAWN AEROBIC ONLY 5CC  Final   Culture  Setup Time   Final    06/21/2014 02:56 Performed at [facility]    Culture   Final    NO GROWTH 5 DAYS Performed at [facility]    Report Status 06/27/2014 FINAL  Final  MRSA PCR Screening     Status: None   Collection Time: 06/26/14  6:53 AM  Result Value Ref Range Status   MRSA by PCR NEGATIVE NEGATIVE Final    Comment:        The GeneXpert MRSA Assay (FDA approved for NASAL specimens only), is one component of a comprehensive MRSA colonization surveillance program. It is not intended to  diagnose MRSA infection nor to guide or monitor treatment for MRSA infections.      Scheduled Meds: . amLODipine  5 mg Oral Daily  . antiseptic oral rinse  7 mL Mouth Rinse q12n4p  . aspirin EC  81 mg Oral Q breakfast  . bisacodyl  10 mg Rectal Once  . cefUROXime  250 mg Oral BID WC  . chlorhexidine  15 mL Mouth Rinse BID  . docusate sodium  100 mg Oral BID  . ezetimibe  10 mg Oral Q breakfast  . ferrous sulfate  325 mg Oral Q breakfast  . furosemide  20 mg Intravenous Q12H  . gabapentin  300 mg Oral Q24H  . gabapentin  600 mg Oral BID  . heparin  5,000 Units Subcutaneous 3 times per day  . insulin aspart  0-15 Units Subcutaneous TID WC  . insulin glargine  25 Units Subcutaneous QHS  . ipratropium-albuterol  3 mL Nebulization TID  . methylPREDNISolone (SOLU-MEDROL) injection  80 mg Intravenous Q12H  . metoprolol  50 mg Oral BID  . oxyCODONE-acetaminophen  1 tablet Oral Q24H  . oxyCODONE-acetaminophen  2 tablet Oral BID  . sodium chloride  3 mL Intravenous Q12H   Continuous Infusions:    Elva Mauro, DO  [facility] Pager [phone]  If 7PM-7AM, please contact night-coverage [email or web address] Password [email or web address] 06/27/2014, 11:39 AM   LOS: 7 days

## 2014-06-27 NOTE — Progress Notes (Signed)
Pt currently off BIPAP and on 5 LPM [place] and tolerating well at this time.  Pt is in no noted distress at this time and has no increased WOB.  BIPAP not needed at this time, RT to monitor and assess as needed.

## 2014-06-27 NOTE — Progress Notes (Signed)
Inpatient Diabetes Program Recommendations  AACE/ADA: New Consensus Statement on Inpatient Glycemic Control (2013)  Target Ranges:  Prepandial:   less than 140 mg/dL      Peak postprandial:   less than 180 mg/dL (1-2 hours)      Critically ill patients:  140 - 180 mg/dL     Results for [name], Court A (MRN [id number]) as of 06/27/2014 10:15  Ref. Range 06/26/2014 08:29 06/26/2014 12:25 06/26/2014 17:12 06/26/2014 21:27  Glucose-Capillary Latest Range: 70-99 mg/dL 147254 (H) 829174 (H) 562181 (H) 226 (H)    Results for [name], Beverley A (MRN [id number]) as of 06/27/2014 10:15  Ref. Range 06/27/2014 08:01  Glucose-Capillary Latest Range: 70-99 mg/dL 696275 (H)     Home DM Meds: Lantus 20 units QAM   Current Orders: Lantus 25 units QHS     Novolog Moderate SSI    **Patient currently receiving IV Solumedrol 80 mg Q12 hours.  **Glucose levels consistently elevated.    MD- Please consider the following while patient on IV steroids:  1. Increase Lantus further to 30 units QHS 2. Add Novolog Meal Coverage (if patient eating OK)- Novolog 4 units tid with meals    Will follow Ambrose FinlandJeannine Johnston Brixton Franko RN, MSN, CDE Diabetes Coordinator Inpatient Diabetes Program Team Pager: [phone] (8a-10p)

## 2014-06-27 NOTE — Progress Notes (Signed)
PULMONARY / CRITICAL CARE MEDICINE   Name: [name] [name] MRN: [id number] DOB: 12-11-1928    ADMISSION DATE:  06/20/2014 CONSULTATION DATE:  06/21/2014  REFERRING MD :   Tat  CHIEF COMPLAINT: Shortness of breath  INITIAL PRESENTATION: 80 year old male with no prior pulmonary history was admitted on 06/20/2014 with acute hypoxemic respiratory failure after an upper respiratory infection syndrome vs amiodarone toxicity (amio d/c'd on 06/21/14 with ESR = 75)   STUDIES:  12/21 CT chest >> there is notable emphysema in the lungs bilaterally with honeycombing in the bases and interstitial thickening, overall picture worrisome for interstitial lung disease superimposed on a background of emphysema. See radiology report for other findings including aorta, status post grafting. 12/22  ECHO >> EF 50-55%, mod LVH, PA Peak 41, dilated descending thoracic aorta 4.4 cm   SIGNIFICANT EVENTS: 12/22  PCCM consulted for hypoxemic respiratory failure 12/23  Remains on 15L O2, started solumedrol; ILD serology labs negative 12/24  O2 requirements down to 5L 12/26 tr to SDU for bipap   SUBJECTIVE:  To SDU , on bipap Appears comfortable, tolerating well, down to 60%  VITAL SIGNS: Temp:  [97.5 F (36.4 C)-98.3 F (36.8 C)] 97.5 F (36.4 C) (12/28 0000) Pulse Rate:  [50-82] 80 (12/28 0800) Resp:  [11-29] 16 (12/28 0800) BP: (154-198)/(40-155) 180/44 mmHg (12/28 0800) SpO2:  [85 %-100 %] 88 % (12/28 0800) FiO2 (%):  [60 %] 60 % (12/27 1442) Weight:  [72.5 kg (159 lb 13.3 oz)] 72.5 kg (159 lb 13.3 oz) (12/28 0500)  VENTILATOR SETTINGS: Vent Mode:  [-]  FiO2 (%):  [60 %] 60 %   INTAKE / OUTPUT:  Intake/Output Summary (Last 24 hours) at 06/27/14 0842 Last data filed at 06/27/14 0600  Gross per 24 hour  Intake    380 ml  Output   5900 ml  Net  -5520 ml    PHYSICAL EXAMINATION: General: Currently on 40% mask, desats quickly when off of it to take pills, was down to 78% on RA. Neuro:  Oriented only to self and place but not time, moving all ext to command. HEENT: NCAT, OP Clear PULM: Crackles bilaterally on insp/ no wheeze  CV: RRR, no mgr Ab: BS+, soft Ext; warm, no edema   LABS:  CBC  Recent Labs Lab 06/25/14 0502 06/26/14 0812 06/27/14 0342  WBC 9.9 11.8* 10.1  HGB 7.9* 9.0* 9.0*  HCT 25.7* 28.8* 29.1*  PLT 220 257 259   Coag's  Recent Labs Lab 06/20/14 1201  INR 1.41   BMET  Recent Labs Lab 06/25/14 0502 06/26/14 0812 06/27/14 0342  NA 138 139 142  K 4.7 4.4 4.1  CL 112 113* 111  CO2 $Re'21 21 23  'Lcc$ BUN 76* 66* 72*  CREATININE 2.35* 1.91* 1.97*  GLUCOSE 383* 274* 314*   Electrolytes  Recent Labs Lab 06/25/14 0502 06/26/14 0812 06/27/14 0342  CALCIUM 8.4 8.9 9.1   Sepsis Markers  Recent Labs Lab 06/20/14 1207  06/24/14 0416 06/26/14 0812 06/27/14 0343  LATICACIDVEN 2.26*  --   --   --   --   PROCALCITON  --   < > 0.53 0.22 0.18  < > = values in this interval not displayed. ABG  Recent Labs Lab 06/20/14 1301 06/26/14 0112  PHART 7.409 7.392  PCO2ART 34.0* 33.2*  PO2ART 84.6 200.0*   Liver Enzymes  Recent Labs Lab 06/20/14 1201  AST 26  ALT 15  ALKPHOS 85  BILITOT 1.0  ALBUMIN 3.3*  Cardiac Enzymes  Recent Labs Lab 06/20/14 1201 06/20/14 1202  TROPONINI <0.30  --   PROBNP  --  14250.0*   Glucose  Recent Labs Lab 06/25/14 2203 06/26/14 0829 06/26/14 1225 06/26/14 1712 06/26/14 2127 06/27/14 0801  GLUCAP 292* 254* 174* 181* 226* 275*   Lab Results  Component Value Date   ESRSEDRATE 75* 06/21/2014    Imaging    ASSESSMENT / PLAN:  PULMONARY A:  Acute hypoxemic respiratory failure: See previous discussion for DDX, Given improvement with steroids and holding amiodarone (and lack of response to antibiotics and diuretics), c/w amiodarone pulmonary toxicity  Severe underlying emphysema, not in exacerbation PAH - pa peak 41 on ECHO P:   - Worse with po pred > 12/26 change back to  solumedrol 80 q 12h and will continue for now. - Continue O2 as needed for saturations > 88% - Amiodarone permanently d/c'd 12/22 - Will need O2 eval prior to discharge, so ambulatory desat study when closer to discharge needs to be ordered. - Change BiPAP to PRN, WOB acceptable this AM but desaturates very quickly.  CARDIOVASCULAR A:  Acute diastolic heart failure? Has elevated JVD, proBNP Afib  Hypertensive in the 180. P:  - Recommend decreasing lasix to 20 mg IV BID and keep even at this time. - May need cardiology to see him after stopping amiodarone (follows with Dr. Claiborne Billings), rate is adequately controlled for now. - Low dose Norvasc ordered.  INFECTIOUS U. Legionella 12/22 > neg  RVF 12/22 >> neg  BCx2 12/21 >> neg A:  Don't think he has HCAP Remains on cefuroxime per primary MD. P: No role for abx , also note low pct, but will defer to primary.  Prognosis guarded - if amio toxicity, can take weeks to resolve but expcet to see some improvement in hypoxia soon.  Decrease lasix as ordered.  Add norvasc with holding parameters.  Ambulate.  Would not move out of the SDU for now.   Rush Farmer, M.D. [facility] Pulmonary/Critical Care Medicine. Pager: [phone]. After hours pager: [phone].

## 2014-06-28 ENCOUNTER — Ambulatory Visit: Payer: Medicare Other

## 2014-06-28 DIAGNOSIS — R0902 Hypoxemia: Secondary | ICD-10-CM | POA: Insufficient documentation

## 2014-06-28 DIAGNOSIS — I5033 Acute on chronic diastolic (congestive) heart failure: Principal | ICD-10-CM

## 2014-06-28 LAB — GLUCOSE, CAPILLARY
GLUCOSE-CAPILLARY: 274 mg/dL — AB (ref 70–99)
GLUCOSE-CAPILLARY: 461 mg/dL — AB (ref 70–99)
GLUCOSE-CAPILLARY: 483 mg/dL — AB (ref 70–99)
GLUCOSE-CAPILLARY: 303 mg/dL — AB (ref 70–99)
Glucose-Capillary: 448 mg/dL — ABNORMAL HIGH (ref 70–99)

## 2014-06-28 LAB — BASIC METABOLIC PANEL
Anion gap: 9 (ref 5–15)
BUN: 81 mg/dL — AB (ref 6–23)
CO2: 26 mmol/L (ref 19–32)
Calcium: 9.5 mg/dL (ref 8.4–10.5)
Chloride: 107 mEq/L (ref 96–112)
Creatinine, Ser: 2.2 mg/dL — ABNORMAL HIGH (ref 0.50–1.35)
GFR calc Af Amer: 30 mL/min — ABNORMAL LOW (ref >90)
GFR calc non Af Amer: 26 mL/min — ABNORMAL LOW (ref >90)
GLUCOSE: 279 mg/dL — AB (ref 70–99)
POTASSIUM: 4.2 mmol/L (ref 3.5–5.1)
Sodium: 142 mmol/L (ref 135–145)

## 2014-06-28 LAB — URINE CULTURE
Colony Count: NO GROWTH
Culture: NO GROWTH

## 2014-06-28 LAB — PROCALCITONIN: Procalcitonin: 0.17 ng/mL

## 2014-06-28 MED ORDER — METOPROLOL TARTRATE 25 MG PO TABS
62.5000 mg | ORAL_TABLET | Freq: Two times a day (BID) | ORAL | Status: DC
  Administered 2014-06-28 – 2014-06-30 (×5): 62.5 mg via ORAL
  Filled 2014-06-28 (×9): qty 1
  Filled 2014-06-28: qty 2

## 2014-06-28 MED ORDER — GABAPENTIN 300 MG PO CAPS
300.0000 mg | ORAL_CAPSULE | Freq: Two times a day (BID) | ORAL | Status: DC
  Administered 2014-06-28 – 2014-06-30 (×4): 300 mg via ORAL
  Filled 2014-06-28 (×4): qty 1

## 2014-06-28 MED ORDER — INSULIN ASPART 100 UNIT/ML ~~LOC~~ SOLN
4.0000 [IU] | Freq: Three times a day (TID) | SUBCUTANEOUS | Status: DC
  Administered 2014-06-28 – 2014-06-29 (×4): 4 [IU] via SUBCUTANEOUS

## 2014-06-28 MED ORDER — INSULIN ASPART 100 UNIT/ML ~~LOC~~ SOLN
8.0000 [IU] | Freq: Once | SUBCUTANEOUS | Status: AC
  Administered 2014-06-28: 8 [IU] via SUBCUTANEOUS

## 2014-06-28 MED ORDER — AMLODIPINE BESYLATE 5 MG PO TABS
7.5000 mg | ORAL_TABLET | Freq: Every day | ORAL | Status: DC
  Administered 2014-06-28 – 2014-06-30 (×3): 7.5 mg via ORAL
  Filled 2014-06-28: qty 1
  Filled 2014-06-28: qty 2
  Filled 2014-06-28: qty 1

## 2014-06-28 MED ORDER — INSULIN GLARGINE 100 UNIT/ML ~~LOC~~ SOLN
35.0000 [IU] | Freq: Every day | SUBCUTANEOUS | Status: DC
  Administered 2014-06-28 – 2014-06-29 (×2): 35 [IU] via SUBCUTANEOUS
  Filled 2014-06-28 (×3): qty 0.35

## 2014-06-28 NOTE — Progress Notes (Signed)
Subjective:  Breathing better; no chest pain  Objective:   Vital Signs in the last 24 hours: Temp:  [97.5 F (36.4 C)-98.5 F (36.9 C)] 97.9 F (36.6 C) (12/29 0500) Pulse Rate:  [53-104] 75 (12/29 0600) Resp:  [12-25] 16 (12/29 0600) BP: (106-204)/(37-155) 161/40 mmHg (12/29 0600) SpO2:  [86 %-100 %] 97 % (12/29 0600) FiO2 (%):  [40 %] 40 % (12/28 0800) Weight:  [151 lb 7.3 oz (68.7 kg)] 151 lb 7.3 oz (68.7 kg) (12/29 0500)  Intake/Output from previous day: 12/28 0701 - 12/29 0700 In: 210 [I.V.:210] Out: 3180 [Urine:3180]   I/O since admission: -7981  Medications: . amLODipine  5 mg Oral Daily  . antiseptic oral rinse  7 mL Mouth Rinse q12n4p  . aspirin EC  81 mg Oral Q breakfast  . bisacodyl  10 mg Rectal Once  . cefUROXime  250 mg Oral BID WC  . chlorhexidine  15 mL Mouth Rinse BID  . docusate sodium  100 mg Oral BID  . ezetimibe  10 mg Oral Q breakfast  . ferrous sulfate  325 mg Oral Q breakfast  . furosemide  20 mg Intravenous Q12H  . gabapentin  300 mg Oral Q24H  . gabapentin  600 mg Oral BID  . heparin  5,000 Units Subcutaneous 3 times per day  . insulin aspart  0-15 Units Subcutaneous TID WC  . insulin glargine  30 Units Subcutaneous QHS  . ipratropium-albuterol  3 mL Nebulization TID  . methylPREDNISolone (SOLU-MEDROL) injection  80 mg Intravenous Q12H  . metoprolol  50 mg Oral BID  . oxyCODONE-acetaminophen  1 tablet Oral Q24H  . oxyCODONE-acetaminophen  2 tablet Oral BID  . sodium chloride  3 mL Intravenous Q12H       Physical Exam:   General appearance: alert, cooperative, fatigued and no distress Neck: no adenopathy, no JVD, supple, symmetrical, trachea midline and thyroid not enlarged, symmetric, no tenderness/mass/nodules Lungs: Decreased BS without wheezing Heart: irregularly irregular rhythm Abdomen: soft, non-tender; bowel sounds normal; no masses,  no organomegaly Extremities: no edema Neurologic: Alert and oriented X 3, LE weakness and  atrophy   Rate: 78  Rhythm: atrial fibrillation  Lab Results:   Recent Labs  06/27/14 0342 06/28/14 0331  NA 142 142  K 4.1 4.2  CL 111 107  CO2 23 26  GLUCOSE 314* 279*  BUN 72* 81*  CREATININE 1.97* 2.20*    No results for input(s): TROPONINI in the last 72 hours.  Invalid input(s): CK, MB  Hepatic Function Panel No results for input(s): PROT, ALBUMIN, AST, ALT, ALKPHOS, BILITOT, BILIDIR, IBILI in the last 72 hours. No results for input(s): INR in the last 72 hours. BNP (last 3 results)  Recent Labs  05/18/14 1410 06/20/14 1202  PROBNP 4183.0* 14250.0*    Lipid Panel     Component Value Date/Time   CHOL 182 11/04/2013 1135   TRIG 85 11/04/2013 1135   HDL 54 11/04/2013 1135   CHOLHDL 3.4 11/04/2013 1135   VLDL 17 11/04/2013 1135   LDLCALC 111* 11/04/2013 1135      Imaging:  Dg Chest Port 1 View  06/27/2014   CLINICAL DATA:  Acute respiratory failure.  EXAM: PORTABLE CHEST - 1 VIEW  COMPARISON:  06/25/2014, 06/23/2014, 03/29/2013.  CT 06/20/2014.  FINDINGS: Mediastinum hilar structures normal. Prior CABG. Stable cardiomegaly with normal pulmonary vascularity. Persistent dense bilateral pulmonary interstitial infiltrates. Known right upper lobe pulmonary nodule better characterized by recent CT. No pleural effusion or  pneumothorax. No acute osseus abnormality.  IMPRESSION: 1. Persistent dense bilateral pulmonary interstitial infiltrates. 2. Known right upper lobe pulmonary nodule better characterized by CT of 06/20/2014. 3. Prior CABG.  Heart size is stable.  Pulmonary vascularity normal.   Electronically Signed   By: [name]  Register   On: 06/27/2014 07:19      Assessment/Plan:   Active Problems:   CAD - CABG x 4 in 2006   PAF - not on chronic anticoagulation due to high bleeding risk   HTN (hypertension)   Neurogenic bladder   Acute respiratory failure   CKD (chronic kidney disease) stage 3, GFR 30-59 ml/min   Chronic obstructive pulmonary disease  with acute exacerbation   Urinary tract infectious disease   Pulmonary fibrosis   Centrilobular emphysema   Abnormal CXR (chest x-ray)   Persistent atrial fibrillation   H/O aortic dissection   Hx of CABG  1. AF: now persistent off amiodarone. Agree with rate control strategy for now.  Will titrate metoprolol to 62.5 mg bid today. Now off NOAC due to fall risk, continue ASA. If wheezing occurs on increased BB may need to reduce BB and change amlodipine to cardizem 2. Essential HTN: BP increased today, currently 172 systolic. Will increase amlodipine to 7.5 mg daily. 3. Renal Insufficiency: Cr 2.2 not on any ACE-I or ARB 4. Acute on Chronic Diastolic Heart failure;  -7981 net output since admission; will repeat BNP 5. CAD: s/p CABG, no chest pain. 6. H/O Aortic dissection/cardiac tamponade 7. PVD 8. Type 2 DM, sugars elevated    Lennette Biharihomas A. Tesslyn Baumert, MD, [facility] Psych[facility] CenterFACC 06/28/2014, 7:51 AM

## 2014-06-28 NOTE — Progress Notes (Signed)
PROGRESS NOTE  [name] [name] ZOX:[id number] DOB: 02-01-1929 DOA: 06/20/2014 PCP: [name], MD   Brief history 80 year old male with a history of COPD, paroxysmal A. fib, CKD stage III, diabetes mellitus, hypertension, thoracic aortic aneurysm repair presents with 3 day history of worsening shortness of breath. Notably, the patient was recently admitted to the hospital from November 18 through 05/21/2014 for pneumonia. The patient was discharged home with levofloxacin. The patient presented to his primary care physician's office on the day of admission when he was noted to have oxygen saturation of 80%. He was sent to the emergency department. In the emergency department, the patient was noted to have oxygen saturation of 63% on room air. CT of the chest showed "mixed density surrounding tubular calcification in the ascending thoracic aorta potentially from chronic dissection, chronic muralthrombus, or even intramural hematoma. Some of this appearance could be from an ascending aortic graft". CT of the abdomen and pelvis was negative for any acute findings, but it does show prominent stool.  The patient was started on intravenous antibiotics, furosemide but did not clinically improve. 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 Amiodarone was discontinued, and the patient's atrial fibrillation has remained rate controlled. Unfortunately, the patient developed respiratory distress during the night on 06/25/2014. Chest x-ray and exam suggested the patient developed fluid overload and he was started on intravenous furosemide with clinical improvement. Assessment/Plan: Acute respiratory failure -Likely multifactorial with the possibility of pulmonary edema, pneumonia (HCAP), and consideration for underlying ILD or BOOP/ Amiodarone pulmonary toxicity -06/25/14 evening-patient developed respiratory distress -06/26/2014 chest  x-ray--worsening diffuse interstitial infiltrates -06/25/2014--intravenous Solu-Medrol restarted by pulmonary - at time of admission, The patient did not have any significant clinical improvement with intravenous furosemide x 4 doses or abx -appreciate pulmonary followup-->plan to switch to oral prednisone on 06/29/2014 if medically stable -Presently stable on 2 L nasal cannula -06/26/14 Redose furosemide IV x 3-->20mg , 40mg , 40 mg-->put out 6L with improving renal function   Pulmonary opacities/infiltrates -No significant clinical improvement with intravenous furosemide 4 doses initially at time of admission -since no clear signs of lung infection will narrow antibiotics to cover only UTI -Blood cultures 2 sets--Unfortunately, this was not initially ordered, and Zosyn was given prior to collection of the blood cultures -check procalcitonin-->1.33-->0.53-->0.18 -Continue bronchodilators -06/26/2014 chest x-ray shows worsening bilateral interstitial opacities--> redosed intravenous furosemide  Elevated proBNP/Acute diastolic CHF  -Although the patient has CKD stage III which may elevate BNP, this is more elevated than his prior admission  -Although the patient has some JVD on examination, he does not appear to be floridly fluid overloaded  -11/02/13 Echo did not show any RV dysfunction, TR, or pulm HTN  -06/21/2014-- echocardiogram EF 50-55%, increased EDP, mild TR, PAP 41 -I/O's, daily weights -low sodium diet -06/26/2014--> diuresed 6 L -Decrease furosemide to 20 mg IV twice a day--06/28/2014 is last day -plan to convert to po lasix 06/29/14 if stable Paroxysmal Atrial fibrillation  -Rate controlled  -back in Afib after initial sinus -Discontinue apixiban given the patient's recent history of multiple falls  -continue aspirin  -discontinue amiodarone, due to concerns for pulmonary toxicity -appreciate cardiology--> metoprolol dose increased to 62.5 mg twice a day, amlodipine  increased to 7.5 mg daily Pyuria -TNTC WBC in urine  -continue zosyn; will narrow to ceftin for 5 more days to complete tx for UTI -unfortunately, urine culture was not sent from ED -Today is  day #8 of antibiotics   Acute on CKD stage III  -Baseline creatinine 1.7-2.1  -due to diuresis and presumed UTI -Cr improved to 1.97 -follow renal function trend  Altered mental status -likely due to infection/hypoxemia -resolved now -The patient is now A&Ox3  -CT brain neg for acute findings  COPD  -continue bronchodilators -no wheezing presently -per PCCM will continue steroids for potential ILD  Diabetes mellitus type 2  -05/18/2014 hemoglobin A1c 6.5  -CBGs elevated secondary to steroids, increase Lantus to 365 units  -NovoLog sliding scale  -Add NovoLog 4 units with meals -will adjust coverage as needed while on steroids  Hypertension  -Continue metoprolol tartrate--increase to 62.5 mg twice a day -Hold Cardura -will continue holding lasix given recent worsening on renal function -added amlodipine coronary artery disease  -Patient had CABG in 2006 -Currently chest pain-free -Continue metoprolol tartrate   Thoracic Aortic Aneursym -s/p repair -changes on CT likely due to previous repair -continue outpatient follow up  Constipation -colace and bisacodyl -will use enema if no improvement  Family Communication: wife updated at bedside Disposition Plan: Home when medically stable   Procedures/Studies: Ct Abdomen Pelvis Wo Contrast  06/20/2014   CLINICAL DATA:  Fall in bathroom. Anti coagulation. Increased confusion.  EXAM: CT CHEST, ABDOMEN AND PELVIS WITHOUT CONTRAST  TECHNIQUE: Multidetector CT imaging of the chest, abdomen and pelvis was performed following the standard protocol without IV contrast.  COMPARISON:  Multiple exams, including 05/19/2014 and 03/26/2005  FINDINGS: CT CHEST FINDINGS  Right upper paratracheal node 1.2 cm in short axis, image 12  series 2. Subcarinal node 1.4 cm in short axis, image 34 series 2. Additional paratracheal nodes are present. If hilar nodes are enlarged there difficult to measure against the background vasculature.  Aortic and branch vessel atherosclerosis. Prior CABG. Ascending thoracic aorta 4.6 cm transverse ; descending thoracic aorta 4.6 cm transverse. Lower descending thoracic aorta 5.0 cm transverse. There is mixed density surrounding tubular calcification in the ascending thoracic aorta potentially from chronic dissection, chronic mural thrombus, or even intramural hematoma. Some of this appearance could be from an ascending aortic graft-correlate with patient history. Without IV contrast did is difficult to characterize this further.  Trace left pleural effusion. Mild enlargement of the cardiopericardial silhouette  Emphysema is present with scattered scarring and peripheral fibrosis potentially with some early honeycombing at the lung bases. In addition, there is a 2.4 by 1.4 cm nodule anteriorly in the right upper lobe, image 25 of series 5 interstitial accentuation is present bilaterally.  CT ABDOMEN AND PELVIS FINDINGS  Hepatobiliary: Unremarkable  Pancreas: Speckled calcifications throughout the pancreatic parenchyma compatible with chronic calcific pancreatitis.  Spleen: Unremarkable  Adrenals/Urinary Tract: 8 mm left kidney lower pole nonobstructive calculus. Bilateral fluid density renal lesions favoring cysts. No hydronephrosis or hydroureter. No ureteral calculi.  Stomach/Bowel: Prominent stool throughout the colon favors constipation.  Vascular/Lymphatic: Dense aortoiliac atherosclerosis.  Reproductive: Enlarged prostate gland with scattered calcifications, measuring 5.8 by 4.6 cm.  Other: No supplemental non-categorized findings.  Musculoskeletal: Bridging fusion of the sacroiliac joints. posterolateral rod and pedicle screw fixation at L4-5 with interbody and anterior facet fusion. Intervertebral and facet  spurring at L3-4 and L5-S1. Small bilateral inguinal hernias contain adipose tissue.  IMPRESSION: 1. 2.4 by 1.4 cm right upper lobe pulmonary nodule with right paratracheal adenopathy, concerning for malignancy. An atypical infectious process could potentially give this appearance. Consider nuclear medicine PET-CT. 2. Severe emphysema with peripheral fibrosis, interstitial accentuation, and some basilar honeycombing. 3. Ascending and  descending thoracic aortic aneurysms measuring up to 5 cm transversely. There is a mixed density in the ascending aortic aneurysm with a complex appearance, possibly from graft material and excluded thrombus, chronic mural thrombus, chronic dissection, or intramural hematoma. Correlate with operative history. If IV contrast could not be administered, MRI might help further assess the thoracic aorta. Otherwise I recommend semi-annual imaging followup by CTA or MRA and referral to cardiothoracic surgery if not already obtained. This recommendation follows 2010 ACCF/AHA/AATS/ACR/ASA/SCA/SCAI/SIR/STS/SVM Guidelines for the Diagnosis and Management of Patients With Thoracic Aortic Disease. Circulation. 2010; 121: Z610-R604. 4. Trace left pleural effusion. 5. Chronic calcific pancreatitis. 6.  Prominent stool throughout the colon favors constipation. 7. Enlarged prostate gland. 8. Lumbar spondylosis particularly at L3-4 and L5-S1.   Electronically Signed   By: Herbie Baltimore M.D.   On: 06/20/2014 13:58   Ct Head Wo Contrast  06/20/2014   CLINICAL DATA:  Fall.  Hypoxia.  EXAM: CT HEAD WITHOUT CONTRAST  CT CERVICAL SPINE WITHOUT CONTRAST  TECHNIQUE: Multidetector CT imaging of the head and cervical spine was performed following the standard protocol without intravenous contrast. Multiplanar CT image reconstructions of the cervical spine were also generated.  COMPARISON:  Report from 11/24/2000  FINDINGS: CT HEAD FINDINGS  Hypodense lesion in the right middle cranial fossa tracking up into  the sylvian fissure, fluid density, probably an arachnoid cyst or epidermoid. Size 4.0 by 3.2 by approximately 6.0 cm.  Atherosclerosis noted. The brainstem, cerebellum, cerebral peduncle sits, thalami, and basal ganglia appear unremarkable. The ventricular system appears normal and aside from the right middle cranial fossa lesion the basilar cisterns appear normal.  Periventricular white matter and corona radiata hypodensities favor chronic ischemic microvascular white matter disease. No intracranial hemorrhage, mass lesion, or acute CVA. There is a subcutaneous lesion along the occiput/upper neck in the midline on image 1 of series 3 which could represent subcutaneous bruising. Chronic left frontal sinusitis.  CT CERVICAL SPINE FINDINGS  Considerable degenerative findings at the craniocervical junction and anterior arch of C1-2 with extensive spurring of the basion and pannus formation posterior to the odontoid. Prominent loss of articular space at the anterior C1-2 articulation.  Uncinate and facet spurring cause osseous foraminal stenosis on the right at C2-3 and on the left at C3-4, C4-5, and C6-7. There is interbody fusion at the C5-6 low-level and facet fusion on the left at C4-5 and C7-T1.  There is 2 mm degenerative anterolisthesis at C4-5 and at C7-T1. Degenerative endplate sclerosis noted with posterior osseous ridging at C6-7, C7-T1, and T1-2.  No prevertebral soft tissue swelling. No acute fracture is identified. There is dextroconvex cervical scoliosis.  Prominent emphysema and scarring noted at the lung apices.  IMPRESSION: 1. Right middle cranial fossa arachnoid cyst or epidermoid tracking into the sylvian fissure. MRI can typically differentiate between these 2 entities if clinically warranted. 2. Atherosclerosis. 3. Dense subcutaneous lesion along the occiput but and upper neck could represent some focal bruising. 4. Chronic left frontal sinusitis. 5. Cervical spondylosis causes multilevel osseous  foraminal impingement. 6. Considerable spurring at the C1- 2 articulation and along the basion, with pannus formation posterior to the odontoid-rheumatoid arthropathy not excluded. 7. Emphysema.   Electronically Signed   By: Herbie Baltimore M.D.   On: 06/20/2014 13:36   Ct Chest Wo Contrast  06/20/2014   CLINICAL DATA:  Fall in bathroom. Anti coagulation. Increased confusion.  EXAM: CT CHEST, ABDOMEN AND PELVIS WITHOUT CONTRAST  TECHNIQUE: Multidetector CT imaging of the chest, abdomen  and pelvis was performed following the standard protocol without IV contrast.  COMPARISON:  Multiple exams, including 05/19/2014 and 03/26/2005  FINDINGS: CT CHEST FINDINGS  Right upper paratracheal node 1.2 cm in short axis, image 12 series 2. Subcarinal node 1.4 cm in short axis, image 34 series 2. Additional paratracheal nodes are present. If hilar nodes are enlarged there difficult to measure against the background vasculature.  Aortic and branch vessel atherosclerosis. Prior CABG. Ascending thoracic aorta 4.6 cm transverse ; descending thoracic aorta 4.6 cm transverse. Lower descending thoracic aorta 5.0 cm transverse. There is mixed density surrounding tubular calcification in the ascending thoracic aorta potentially from chronic dissection, chronic mural thrombus, or even intramural hematoma. Some of this appearance could be from an ascending aortic graft-correlate with patient history. Without IV contrast did is difficult to characterize this further.  Trace left pleural effusion. Mild enlargement of the cardiopericardial silhouette  Emphysema is present with scattered scarring and peripheral fibrosis potentially with some early honeycombing at the lung bases. In addition, there is a 2.4 by 1.4 cm nodule anteriorly in the right upper lobe, image 25 of series 5 interstitial accentuation is present bilaterally.  CT ABDOMEN AND PELVIS FINDINGS  Hepatobiliary: Unremarkable  Pancreas: Speckled calcifications throughout the  pancreatic parenchyma compatible with chronic calcific pancreatitis.  Spleen: Unremarkable  Adrenals/Urinary Tract: 8 mm left kidney lower pole nonobstructive calculus. Bilateral fluid density renal lesions favoring cysts. No hydronephrosis or hydroureter. No ureteral calculi.  Stomach/Bowel: Prominent stool throughout the colon favors constipation.  Vascular/Lymphatic: Dense aortoiliac atherosclerosis.  Reproductive: Enlarged prostate gland with scattered calcifications, measuring 5.8 by 4.6 cm.  Other: No supplemental non-categorized findings.  Musculoskeletal: Bridging fusion of the sacroiliac joints. posterolateral rod and pedicle screw fixation at L4-5 with interbody and anterior facet fusion. Intervertebral and facet spurring at L3-4 and L5-S1. Small bilateral inguinal hernias contain adipose tissue.  IMPRESSION: 1. 2.4 by 1.4 cm right upper lobe pulmonary nodule with right paratracheal adenopathy, concerning for malignancy. An atypical infectious process could potentially give this appearance. Consider nuclear medicine PET-CT. 2. Severe emphysema with peripheral fibrosis, interstitial accentuation, and some basilar honeycombing. 3. Ascending and descending thoracic aortic aneurysms measuring up to 5 cm transversely. There is a mixed density in the ascending aortic aneurysm with a complex appearance, possibly from graft material and excluded thrombus, chronic mural thrombus, chronic dissection, or intramural hematoma. Correlate with operative history. If IV contrast could not be administered, MRI might help further assess the thoracic aorta. Otherwise I recommend semi-annual imaging followup by CTA or MRA and referral to cardiothoracic surgery if not already obtained. This recommendation follows 2010 ACCF/AHA/AATS/ACR/ASA/SCA/SCAI/SIR/STS/SVM Guidelines for the Diagnosis and Management of Patients With Thoracic Aortic Disease. Circulation. 2010; 121: Z610-R604. 4. Trace left pleural effusion. 5. Chronic  calcific pancreatitis. 6.  Prominent stool throughout the colon favors constipation. 7. Enlarged prostate gland. 8. Lumbar spondylosis particularly at L3-4 and L5-S1.   Electronically Signed   By: Herbie Baltimore M.D.   On: 06/20/2014 13:58   Ct Cervical Spine Wo Contrast  06/20/2014   CLINICAL DATA:  Fall.  Hypoxia.  EXAM: CT HEAD WITHOUT CONTRAST  CT CERVICAL SPINE WITHOUT CONTRAST  TECHNIQUE: Multidetector CT imaging of the head and cervical spine was performed following the standard protocol without intravenous contrast. Multiplanar CT image reconstructions of the cervical spine were also generated.  COMPARISON:  Report from 11/24/2000  FINDINGS: CT HEAD FINDINGS  Hypodense lesion in the right middle cranial fossa tracking up into the sylvian fissure, fluid density,  probably an arachnoid cyst or epidermoid. Size 4.0 by 3.2 by approximately 6.0 cm.  Atherosclerosis noted. The brainstem, cerebellum, cerebral peduncle sits, thalami, and basal ganglia appear unremarkable. The ventricular system appears normal and aside from the right middle cranial fossa lesion the basilar cisterns appear normal.  Periventricular white matter and corona radiata hypodensities favor chronic ischemic microvascular white matter disease. No intracranial hemorrhage, mass lesion, or acute CVA. There is a subcutaneous lesion along the occiput/upper neck in the midline on image 1 of series 3 which could represent subcutaneous bruising. Chronic left frontal sinusitis.  CT CERVICAL SPINE FINDINGS  Considerable degenerative findings at the craniocervical junction and anterior arch of C1-2 with extensive spurring of the basion and pannus formation posterior to the odontoid. Prominent loss of articular space at the anterior C1-2 articulation.  Uncinate and facet spurring cause osseous foraminal stenosis on the right at C2-3 and on the left at C3-4, C4-5, and C6-7. There is interbody fusion at the C5-6 low-level and facet fusion on the left  at C4-5 and C7-T1.  There is 2 mm degenerative anterolisthesis at C4-5 and at C7-T1. Degenerative endplate sclerosis noted with posterior osseous ridging at C6-7, C7-T1, and T1-2.  No prevertebral soft tissue swelling. No acute fracture is identified. There is dextroconvex cervical scoliosis.  Prominent emphysema and scarring noted at the lung apices.  IMPRESSION: 1. Right middle cranial fossa arachnoid cyst or epidermoid tracking into the sylvian fissure. MRI can typically differentiate between these 2 entities if clinically warranted. 2. Atherosclerosis. 3. Dense subcutaneous lesion along the occiput but and upper neck could represent some focal bruising. 4. Chronic left frontal sinusitis. 5. Cervical spondylosis causes multilevel osseous foraminal impingement. 6. Considerable spurring at the C1- 2 articulation and along the basion, with pannus formation posterior to the odontoid-rheumatoid arthropathy not excluded. 7. Emphysema.   Electronically Signed   By: Herbie Baltimore M.D.   On: 06/20/2014 13:36   Dg Chest Port 1 View  06/27/2014   CLINICAL DATA:  Acute respiratory failure.  EXAM: PORTABLE CHEST - 1 VIEW  COMPARISON:  06/25/2014, 06/23/2014, 03/29/2013.  CT 06/20/2014.  FINDINGS: Mediastinum hilar structures normal. Prior CABG. Stable cardiomegaly with normal pulmonary vascularity. Persistent dense bilateral pulmonary interstitial infiltrates. Known right upper lobe pulmonary nodule better characterized by recent CT. No pleural effusion or pneumothorax. No acute osseus abnormality.  IMPRESSION: 1. Persistent dense bilateral pulmonary interstitial infiltrates. 2. Known right upper lobe pulmonary nodule better characterized by CT of 06/20/2014. 3. Prior CABG.  Heart size is stable.  Pulmonary vascularity normal.   Electronically Signed   By: Maisie Fus  Register   On: 06/27/2014 07:19   Dg Chest Port 1 View  06/25/2014   CLINICAL DATA:  Acute onset of hypoxia and worsening shortness of breath. Initial  encounter.  EXAM: PORTABLE CHEST - 1 VIEW  COMPARISON:  Chest radiograph performed 06/23/2014, and CT of the chest performed 06/20/2014  FINDINGS: There is mildly worsened diffuse interstitial prominence, raising suspicion for mild pulmonary edema or atypical pneumonia, superimposed on the patient's chronic emphysema, fibrosis and interstitial lung disease. The known right upper lobe pulmonary nodule is better characterized on recent CT. No definite pleural effusion or pneumothorax is seen.  The cardiomediastinal silhouette remains normal in size. The patient is status post median sternotomy, with evidence of prior CABG. No acute osseous abnormalities are seen.  IMPRESSION: 1. Mildly worsened diffuse interstitial prominence, raising suspicion for mild pulmonary edema or atypical pneumonia, superimposed on the patient's chronic emphysema, fibrosis and  interstitial lung disease. 2. Known right upper lobe pulmonary nodule is better characterized on recent CT.   Electronically Signed   By: Roanna RaiderJeffery  Chang M.D.   On: 06/25/2014 21:53   Dg Chest Port 1 View  06/23/2014   CLINICAL DATA:  Acute respiratory failure, hypoxia  EXAM: PORTABLE CHEST - 1 VIEW  COMPARISON:  06/20/2014  FINDINGS: Cardiomediastinal silhouette is stable. Again noted reticular diffuse interstitial prominence bilaterally probable due to chronic interstitial lung disease and fibrotic changes. No definite superimposed infiltrate or pulmonary edema. Poorly visualized nodule in right upper lobe again noted measures about 2 cm. This was better visualized on recent CT scan.  IMPRESSION: Again noted diffuse reticular interstitial prominence probable due to chronic interstitial lung disease and fibrotic changes. No definite superimposed infiltrate. Again noted nodule in right upper lobe medially. This was better visualized on recent CT scan.   Electronically Signed   By: Natasha MeadLiviu  Pop M.D.   On: 06/23/2014 11:01   Dg Chest Portable 1 View  06/20/2014    CLINICAL DATA:  Fall.  Pneumonia.  Shortness of breath.  Confusion.  EXAM: PORTABLE CHEST - 1 VIEW  COMPARISON:  05/19/2014  FINDINGS: Diffuse interstitial and patchy airspace opacities are present superimposed on emphysema. Atherosclerotic aortic arch and prior CABG. Heart size within normal limits for projection. Stable biapical pleural parenchymal scarring. No pneumothorax observed. No definite blunting of the costophrenic angles.  IMPRESSION: 1. Bilateral interstitial and airspace opacities, worsened, superimposed on severe emphysema. Appearance could reflect edema or atypical pneumonia.   Electronically Signed   By: Herbie BaltimoreWalt  Liebkemann M.D.   On: 06/20/2014 12:09         Subjective: Patient denies fevers, chills, headache, chest pain, dyspnea, nausea, vomiting, diarrhea, abdominal pain, dysuria, hematuria   Objective: Filed Vitals:   06/28/14 0900 06/28/14 0917 06/28/14 1000 06/28/14 1037  BP: 185/66  147/66   Pulse: 56  76 85  Temp:      TempSrc:      Resp: 15  18   Height:      Weight:      SpO2: 92% 94% 97%     Intake/Output Summary (Last 24 hours) at 06/28/14 1235 Last data filed at 06/28/14 1000  Gross per 24 hour  Intake    220 ml  Output   3350 ml  Net  -3130 ml   Weight change: -3.8 kg (-8 lb 6 oz) Exam:   General:  Pt is alert, follows commands appropriately, not in acute distress  HEENT: No icterus, No thrush,  [place]/AT  Cardiovascular: RRR, S1/S2, no rubs, no gallops  Respiratory: Bilateral crackles. No wheezing  Abdomen: Soft/+BS, non tender, non distended, no guarding  Extremities: trace LE edema, No lymphangitis, No petechiae, No rashes, no synovitis  Data Reviewed: Basic Metabolic Panel:  Recent Labs Lab 06/23/14 0420 06/25/14 0502 06/26/14 0812 06/27/14 0342 06/28/14 0331  NA 140 138 139 142 142  K 4.5 4.7 4.4 4.1 4.2  CL 110 112 113* 111 107  CO2 23 21 21 23 26   GLUCOSE 87 383* 274* 314* 279*  BUN 66* 76* 66* 72* 81*  CREATININE 3.46*  2.35* 1.91* 1.97* 2.20*  CALCIUM 8.5 8.4 8.9 9.1 9.5   Liver Function Tests: No results for input(s): AST, ALT, ALKPHOS, BILITOT, PROT, ALBUMIN in the last 168 hours. No results for input(s): LIPASE, AMYLASE in the last 168 hours. No results for input(s): AMMONIA in the last 168 hours. CBC:  Recent Labs Lab 06/22/14 0515 06/25/14  0502 06/26/14 0812 06/27/14 0342  WBC 9.1 9.9 11.8* 10.1  HGB 8.4* 7.9* 9.0* 9.0*  HCT 28.2* 25.7* 28.8* 29.1*  MCV 96.2 92.8 91.1 89.8  PLT 210 220 257 259   Cardiac Enzymes: No results for input(s): CKTOTAL, CKMB, CKMBINDEX, TROPONINI in the last 168 hours. BNP: Invalid input(s): POCBNP CBG:  Recent Labs Lab 06/27/14 0801 06/27/14 1236 06/27/14 1559 06/27/14 2307 06/28/14 0718  GLUCAP 275* 318* 254* 222* 274*    Recent Results (from the past 240 hour(s))  Culture, blood (routine x 2)     Status: None   Collection Time: 06/20/14  5:42 PM  Result Value Ref Range Status   Specimen Description BLOOD RIGHT ARM  Final   Special Requests BOTTLES DRAWN AEROBIC AND ANAEROBIC 10CC  Final   Culture  Setup Time   Final    06/21/2014 02:53 Performed at [facility]    Culture   Final    NO GROWTH 5 DAYS Performed at [facility]    Report Status 06/27/2014 FINAL  Final  Culture, blood (routine x 2)     Status: None   Collection Time: 06/20/14  5:50 PM  Result Value Ref Range Status   Specimen Description BLOOD LEFT HAND  Final   Special Requests BOTTLES DRAWN AEROBIC ONLY 5CC  Final   Culture  Setup Time   Final    06/21/2014 02:56 Performed at [facility]    Culture   Final    NO GROWTH 5 DAYS Performed at [facility]    Report Status 06/27/2014 FINAL  Final  MRSA PCR Screening     Status: None   Collection Time: 06/26/14  6:53 AM  Result Value Ref Range Status   MRSA by PCR NEGATIVE NEGATIVE Final    Comment:        The GeneXpert MRSA Assay (FDA approved for NASAL specimens only), is one  component of a comprehensive MRSA colonization surveillance program. It is not intended to diagnose MRSA infection nor to guide or monitor treatment for MRSA infections.   Urine culture     Status: None   Collection Time: 06/26/14  6:23 PM  Result Value Ref Range Status   Specimen Description URINE, CATHETERIZED  Final   Special Requests NONE  Final   Colony Count NO GROWTH Performed at [facility]   Final   Culture NO GROWTH Performed at [facility]   Final   Report Status 06/28/2014 FINAL  Final     Scheduled Meds: . amLODipine  7.5 mg Oral Daily  . antiseptic oral rinse  7 mL Mouth Rinse q12n4p  . aspirin EC  81 mg Oral Q breakfast  . bisacodyl  10 mg Rectal Once  . chlorhexidine  15 mL Mouth Rinse BID  . docusate sodium  100 mg Oral BID  . ezetimibe  10 mg Oral Q breakfast  . ferrous sulfate  325 mg Oral Q breakfast  . furosemide  20 mg Intravenous Q12H  . gabapentin  300 mg Oral Q24H  . gabapentin  300 mg Oral BID  . heparin  5,000 Units Subcutaneous 3 times per day  . insulin aspart  0-15 Units Subcutaneous TID WC  . insulin aspart  4 Units Subcutaneous TID WC  . insulin glargine  35 Units Subcutaneous QHS  . ipratropium-albuterol  3 mL Nebulization TID  . methylPREDNISolone (SOLU-MEDROL) injection  80 mg Intravenous Q12H  . metoprolol  62.5 mg Oral BID  .  oxyCODONE-acetaminophen  1 tablet Oral Q24H  . oxyCODONE-acetaminophen  2 tablet Oral BID  . sodium chloride  3 mL Intravenous Q12H   Continuous Infusions:    Krystofer Hevener, DO  [facility] Pager [phone]  If 7PM-7AM, please contact night-coverage [email or web address] Password [email or web address] 06/28/2014, 12:35 PM   LOS: 8 days

## 2014-06-28 NOTE — Evaluation (Signed)
Physical Therapy Evaluation Patient Details Name: [name] [name] MRN: [id number] DOB: 03/04/1929 Today's Date: 06/28/2014   History of Present Illness  80 year old male with a history of COPD, paroxysmal A. fib, CKD stage III, diabetes mellitus, hypertension, thoracic aortic aneurysm repair presents with 3 day history of worsening shortness of breath  Clinical Impression  Pt admitted with dx of respiratory failure and presenting with functional mobility limitations 2* fatigue, SOB with min exertion (O2@6L  to maintain sats with activity), and pre-existing paraparesis.  Pt would benefit from follow up rehab at SNF level to maximize IND and safety prior to return home with spouse.    Follow Up Recommendations SNF    Equipment Recommendations  None recommended by PT    Recommendations for Other Services OT consult     Precautions / Restrictions Precautions Precautions: Fall Precaution Comments:  (Pt with hx of paraparesis 2* spinal AVM) Restrictions Weight Bearing Restrictions: No      Mobility  Bed Mobility Overal bed mobility: Needs Assistance Bed Mobility: Supine to Sit;Sit to Supine     Supine to sit: Min guard Sit to supine: Min guard   General bed mobility comments: pt able to bring legs on/off bed without assist  Transfers Overall transfer level: Needs assistance Equipment used: Rolling walker (2 wheeled) Transfers: Sit to/from Stand Sit to Stand: +2 physical assistance;+2 safety/equipment;Mod assist         General transfer comment: cues for LE placement and use of UEs to self assist.  Physical assist to bring wt up and fwd and to block knees  Ambulation/Gait             General Gait Details: Pt stood only with RW an assist of 2.  Pt ltd by fatigue and apprehensive re attempts to step or pvt to chair  Stairs            Wheelchair Mobility    Modified Rankin (Stroke Patients Only)       Balance Overall balance assessment: Needs  assistance Sitting-balance support: Feet supported Sitting balance-Leahy Scale: Fair     Standing balance support: Bilateral upper extremity supported Standing balance-Leahy Scale: Zero                               Pertinent Vitals/Pain Pain Assessment: No/denies pain    Home Living Family/patient expects to be discharged to:: Skilled nursing facility Living Arrangements: Spouse/significant other                    Prior Function Level of Independence: Needs assistance   Gait / Transfers Assistance Needed: Ltd with RW and generally using w/c for mobility  ADL's / Homemaking Assistance Needed: Spouse assisting with all needs        Hand Dominance        Extremity/Trunk Assessment   Upper Extremity Assessment: Overall WFL for tasks assessed           Lower Extremity Assessment: RLE deficits/detail;LLE deficits/detail RLE Deficits / Details: strength 3/5 with ltd sensation s/p spinal AVM LLE Deficits / Details: 3+/5 strength with very min patchy sensation 2* spinal AVM  Cervical / Trunk Assessment: Kyphotic  Communication   Communication: No difficulties  Cognition Arousal/Alertness: Awake/alert Behavior During Therapy: WFL for tasks assessed/performed Overall Cognitive Status: Within Functional Limits for tasks assessed  General Comments      Exercises        Assessment/Plan    PT Assessment Patient needs continued PT services  PT Diagnosis Difficulty walking;Generalized weakness   PT Problem List Decreased strength;Decreased activity tolerance;Decreased balance;Decreased mobility;Decreased coordination;Decreased knowledge of use of DME  PT Treatment Interventions DME instruction;Gait training;Functional mobility training;Therapeutic activities;Therapeutic exercise;Balance training;Patient/family education   PT Goals (Current goals can be found in the Care Plan section) Acute Rehab PT Goals Patient  Stated Goal: Get stronger PT Goal Formulation: With patient Time For Goal Achievement: 07/12/14 Potential to Achieve Goals: Fair    Frequency Min 3X/week   Barriers to discharge        Co-evaluation               End of Session Equipment Utilized During Treatment: Gait belt Activity Tolerance: Patient limited by fatigue Patient left: in bed;with call bell/phone within reach;with family/visitor present Nurse Communication: Mobility status         Time: 1357-1440 PT Time Calculation (min) (ACUTE ONLY): 43 min   Charges:   PT Evaluation $Initial PT Evaluation Tier I: 1 Procedure PT Treatments $Therapeutic Activity: 23-37 mins   PT G Codes:        Hildagarde Holleran 06/28/2014, 4:26 PM

## 2014-06-28 NOTE — Progress Notes (Signed)
Agree with previous RN assessment. Will continue to monitor pt.

## 2014-06-28 NOTE — Progress Notes (Signed)
PULMONARY / CRITICAL CARE MEDICINE   Name: [name] [name] MRN: [id number] DOB: [birth year]    ADMISSION DATE:  06/20/2014 CONSULTATION DATE:  06/21/2014  REFERRING MD :   Tat  CHIEF COMPLAINT: Shortness of breath  INITIAL PRESENTATION: 80 year old male with no prior pulmonary history was admitted on 06/20/2014 with acute hypoxemic respiratory failure after an upper respiratory infection syndrome vs amiodarone toxicity (amio d/c'd on 06/21/14 with ESR = 75)   STUDIES:  12/21 CT chest >> there is notable emphysema in the lungs bilaterally with honeycombing in the bases and interstitial thickening, overall picture worrisome for interstitial lung disease superimposed on a background of emphysema. See radiology report for other findings including aorta, status post grafting. 12/22  ECHO >> EF 50-55%, mod LVH, PA Peak 41, dilated descending thoracic aorta 4.4 cm   SIGNIFICANT EVENTS: 12/22  PCCM consulted for hypoxemic respiratory failure 12/23  Remains on 15L O2, started solumedrol; ILD serology labs negative 12/24  O2 requirements down to 5L 12/26 tr to SDU for bipap  SUBJECTIVE:  Stable, no new complaints, O2 demand decreasing.  VITAL SIGNS: Temp:  [97.5 F (36.4 C)-98.5 F (36.9 C)] 97.9 F (36.6 C) (12/29 0500) Pulse Rate:  [53-104] 75 (12/29 0600) Resp:  [12-25] 16 (12/29 0600) BP: (106-204)/(37-155) 161/40 mmHg (12/29 0600) SpO2:  [86 %-100 %] 97 % (12/29 0600) Weight:  [68.7 kg (151 lb 7.3 oz)] 68.7 kg (151 lb 7.3 oz) (12/29 0500)  VENTILATOR SETTINGS:     INTAKE / OUTPUT:  Intake/Output Summary (Last 24 hours) at 06/28/14 2197 Last data filed at 06/28/14 0600  Gross per 24 hour  Intake    220 ml  Output   3950 ml  Net  -3730 ml    PHYSICAL EXAMINATION: General: 5L [place], comfortable from a respiratory standpoint. Neuro: Asleep but easily arousable, alert and interactive, moving all ext to command. HEENT: [place]/AT, OP Clear PULM: Crackles bilaterally on insp/ no  wheeze  CV: RRR, no mgr Ab: BS+, soft Ext; warm, no edema   LABS:  CBC  Recent Labs Lab 06/25/14 0502 06/26/14 0812 06/27/14 0342  WBC 9.9 11.8* 10.1  HGB 7.9* 9.0* 9.0*  HCT 25.7* 28.8* 29.1*  PLT 220 257 259   Coag's No results for input(s): APTT, INR in the last 168 hours. BMET  Recent Labs Lab 06/26/14 0812 06/27/14 0342 06/28/14 0331  NA 139 142 142  K 4.4 4.1 4.2  CL 113* 111 107  CO2 $Re'21 23 26  'vXj$ BUN 66* 72* 81*  CREATININE 1.91* 1.97* 2.20*  GLUCOSE 274* 314* 279*   Electrolytes  Recent Labs Lab 06/26/14 0812 06/27/14 0342 06/28/14 0331  CALCIUM 8.9 9.1 9.5   Sepsis Markers  Recent Labs Lab 06/26/14 0812 06/27/14 0343 06/28/14 0331  PROCALCITON 0.22 0.18 0.17   ABG  Recent Labs Lab 06/26/14 0112  PHART 7.392  PCO2ART 33.2*  PO2ART 200.0*   Liver Enzymes No results for input(s): AST, ALT, ALKPHOS, BILITOT, ALBUMIN in the last 168 hours. Cardiac Enzymes No results for input(s): TROPONINI, PROBNP in the last 168 hours. Glucose  Recent Labs Lab 06/26/14 1712 06/26/14 2127 06/27/14 0801 06/27/14 1236 06/27/14 1559 06/27/14 2307  GLUCAP 181* 226* 275* 318* 254* 222*   Lab Results  Component Value Date   ESRSEDRATE 75* 06/21/2014    Imaging  ASSESSMENT / PLAN:  PULMONARY A:  Acute hypoxemic respiratory failure: See previous discussion for DDX, Given improvement with steroids and holding amiodarone (and lack of response to antibiotics  and diuretics), c/w amiodarone pulmonary toxicity  Severe underlying emphysema, not in exacerbation PAH - pa peak 41 on ECHO P:   - Worse with po pred > 12/26 change back to solumedrol 80 q 12h and will continue for now and if O2 demand continues to improve by AM will change back to PO prednisone at 40 mg PO daily with slow taper over the next 8-12 wks. - Continue O2 as needed for saturations > 88% - Amiodarone permanently d/c'd 12/22 - Will need O2 eval prior to discharge, so ambulatory desat  study when closer to discharge needs to be ordered. - D/C BiPAP. - One more day of lasix as below.  CARDIOVASCULAR A:  Acute diastolic heart failure? Has elevated JVD, proBNP Afib  Hypertensive in the 170. HR is more controlled this AM (60-70 when asleep). P:  - Continue to hold amiodarone (forever). - No anti-coagulation given fall risk. - May need cardiology to see him after stopping amiodarone (follows with Dr. Claiborne Billings), rate is adequately controlled for now. - Increased norvasc to 7.5 mg PO daily per cardiology's note. - Lasix 20 mg IV BID x2 more doses then recommend changing to 40 mg PO daily after that  INFECTIOUS U. Legionella 12/22 > neg  RVF 12/22 >> neg  BCx2 12/21 >> neg A:  Don't think he has HCAP Remains on cefuroxime per primary MD. P: PCT today is 0.17 recommend d/c of cefuroxime but will defer to primary.  Prognosis better with improvement of O2 demand, recommend d/c abx, agree with increase norvasc to 7.5, no anti-coag, changed diet to carb modified, if O2 demand continues to improve overnight then may move to tele in AM, change solumedrol to prednisone in AM with slow taper, will need ambulatory desaturation study prior to discharge (anticipate will need home O2 at least on a temporary bases, one more day of lasix given exam findings, CXR in AM, will continue to follow with you likely for one more day and leave final recommendations in AM.   Rush Farmer, M.D. [facility]/Critical Care Medicine. Pager: [phone]. After hours pager: [phone].

## 2014-06-28 NOTE — Progress Notes (Signed)
Inpatient Diabetes Program Recommendations  AACE/ADA: New Consensus Statement on Inpatient Glycemic Control (2013)  Target Ranges:  Prepandial:   less than 140 mg/dL      Peak postprandial:   less than 180 mg/dL (1-2 hours)      Critically ill patients:  140 - 180 mg/dL    Results for [name], [name] A (MRN [id number]) as of 06/28/2014 07:49  Ref. Range 06/27/2014 08:01 06/27/2014 12:36 06/27/2014 15:59 06/27/2014 23:07  Glucose-Capillary Latest Range: 70-99 mg/dL 782275 (H) 956318 (H) 213254 (H) 222 (H)    Home DM Meds: Lantus 20 units QAM   Current Orders: Lantus 30 units QHS  Novolog Moderate SSI    **Patient currently receiving IV Solumedrol 80 mg Q12 hours.  **Glucose levels consistently elevated.  **Note Lantus increased to 30 units QHS last PM.    MD- Please consider the following while patient on IV steroids:  Add Novolog Meal Coverage (if patient eating OK)- Novolog 4 units tid with meals    Will follow Ambrose FinlandJeannine Johnston Fitzhugh Vizcarrondo RN, MSN, CDE Diabetes Coordinator Inpatient Diabetes Program Team Pager: [phone] (8a-10p)

## 2014-06-28 NOTE — Telephone Encounter (Signed)
LMTCB x2  

## 2014-06-29 ENCOUNTER — Ambulatory Visit: Payer: Medicare Other

## 2014-06-29 ENCOUNTER — Inpatient Hospital Stay (HOSPITAL_COMMUNITY): Payer: Medicare Other

## 2014-06-29 DIAGNOSIS — N184 Chronic kidney disease, stage 4 (severe): Secondary | ICD-10-CM

## 2014-06-29 LAB — GLUCOSE, CAPILLARY
Glucose-Capillary: 293 mg/dL — ABNORMAL HIGH (ref 70–99)
Glucose-Capillary: 367 mg/dL — ABNORMAL HIGH (ref 70–99)
Glucose-Capillary: 279 mg/dL — ABNORMAL HIGH (ref 70–99)
Glucose-Capillary: 175 mg/dL — ABNORMAL HIGH (ref 70–99)

## 2014-06-29 LAB — CBC
HCT: 32.2 % — ABNORMAL LOW (ref 39.0–52.0)
HEMOGLOBIN: 10.2 g/dL — AB (ref 13.0–17.0)
MCH: 28.6 pg (ref 26.0–34.0)
MCHC: 31.7 g/dL (ref 30.0–36.0)
MCV: 90.2 fL (ref 78.0–100.0)
Platelets: 340 10*3/uL (ref 150–400)
RBC: 3.57 MIL/uL — AB (ref 4.22–5.81)
RDW: 17.2 % — ABNORMAL HIGH (ref 11.5–15.5)
WBC: 11.7 10*3/uL — ABNORMAL HIGH (ref 4.0–10.5)

## 2014-06-29 LAB — MAGNESIUM: Magnesium: 2.3 mg/dL (ref 1.5–2.5)

## 2014-06-29 LAB — BASIC METABOLIC PANEL
Anion gap: 10 (ref 5–15)
BUN: 97 mg/dL — ABNORMAL HIGH (ref 6–23)
CO2: 27 mmol/L (ref 19–32)
CREATININE: 2.5 mg/dL — AB (ref 0.50–1.35)
Calcium: 9.1 mg/dL (ref 8.4–10.5)
Chloride: 105 mEq/L (ref 96–112)
GFR calc Af Amer: 26 mL/min — ABNORMAL LOW (ref >90)
GFR, EST NON AFRICAN AMERICAN: 22 mL/min — AB (ref >90)
GLUCOSE: 269 mg/dL — AB (ref 70–99)
Potassium: 3.7 mmol/L (ref 3.5–5.1)
Sodium: 142 mmol/L (ref 135–145)

## 2014-06-29 LAB — PHOSPHORUS: Phosphorus: 4.2 mg/dL (ref 2.3–4.6)

## 2014-06-29 LAB — BRAIN NATRIURETIC PEPTIDE: B Natriuretic Peptide: 283.2 pg/mL — ABNORMAL HIGH (ref 0.0–100.0)

## 2014-06-29 MED ORDER — PREDNISONE 20 MG PO TABS
40.0000 mg | ORAL_TABLET | Freq: Every day | ORAL | Status: DC
  Administered 2014-06-30: 40 mg via ORAL
  Filled 2014-06-29: qty 2

## 2014-06-29 NOTE — Progress Notes (Signed)
Subjective:  Breathing better; no chest pain  Objective:   Vital Signs in the last 24 hours: Temp:  [97.4 F (36.3 C)-98.1 F (36.7 C)] 97.4 F (36.3 C) (12/30 0335) Pulse Rate:  [51-85] 52 (12/30 0335) Resp:  [15-24] 18 (12/30 0335) BP: (126-185)/(30-66) 151/56 mmHg (12/30 0335) SpO2:  [89 %-100 %] 93 % (12/30 0335) Weight:  [151 lb 3.2 oz (68.584 kg)] 151 lb 3.2 oz (68.584 kg) (12/30 0354)  Intake/Output from previous day: 12/29 0701 - 12/30 0700 In: 120 [I.V.:120] Out: 1275 [Urine:1275]  Net: -1155 yesterday  I/O since admission: -9896  Medications: . amLODipine  7.5 mg Oral Daily  . antiseptic oral rinse  7 mL Mouth Rinse q12n4p  . aspirin EC  81 mg Oral Q breakfast  . bisacodyl  10 mg Rectal Once  . chlorhexidine  15 mL Mouth Rinse BID  . docusate sodium  100 mg Oral BID  . ezetimibe  10 mg Oral Q breakfast  . ferrous sulfate  325 mg Oral Q breakfast  . gabapentin  300 mg Oral Q24H  . gabapentin  300 mg Oral BID  . heparin  5,000 Units Subcutaneous 3 times per day  . insulin aspart  0-15 Units Subcutaneous TID WC  . insulin aspart  4 Units Subcutaneous TID WC  . insulin glargine  35 Units Subcutaneous QHS  . ipratropium-albuterol  3 mL Nebulization TID  . methylPREDNISolone (SOLU-MEDROL) injection  80 mg Intravenous Q12H  . metoprolol  62.5 mg Oral BID  . oxyCODONE-acetaminophen  1 tablet Oral Q24H  . oxyCODONE-acetaminophen  2 tablet Oral BID  . sodium chloride  3 mL Intravenous Q12H       Physical Exam:   General appearance: alert, cooperative, fatigued and no distress Neck: no adenopathy, no JVD, supple, symmetrical, trachea midline and thyroid not enlarged, symmetric, no tenderness/mass/nodules Lungs: Decreased BS without wheezing Heart: irregularly irregular rhythm; rate in the 60 range Abdomen: soft, non-tender; bowel sounds normal; no masses,  no organomegaly Extremities: no edema Neurologic: Alert and oriented X 3, LE weakness and atrophy   Rate:60  Rhythm: atrial fibrillation  Lab Results:   Recent Labs  06/28/14 0331 06/29/14 0410  NA 142 142  K 4.2 3.7  CL 107 105  CO2 26 27  GLUCOSE 279* 269*  BUN 81* 97*  CREATININE 2.20* 2.50*    No results for input(s): TROPONINI in the last 72 hours.  Invalid input(s): CK, MB  Hepatic Function Panel No results for input(s): PROT, ALBUMIN, AST, ALT, ALKPHOS, BILITOT, BILIDIR, IBILI in the last 72 hours. No results for input(s): INR in the last 72 hours. BNP (last 3 results)  Recent Labs  05/18/14 1410 06/20/14 1202  PROBNP 4183.0* 14250.0*    Lipid Panel     Component Value Date/Time   CHOL 182 11/04/2013 1135   TRIG 85 11/04/2013 1135   HDL 54 11/04/2013 1135   CHOLHDL 3.4 11/04/2013 1135   VLDL 17 11/04/2013 1135   LDLCALC 111* 11/04/2013 1135      Imaging:  Dg Chest Port 1 View  06/29/2014   CLINICAL DATA:  Pulmonary infiltrates.  Respiratory failure.  EXAM: PORTABLE CHEST - 1 VIEW  COMPARISON:  06/27/2014.  FINDINGS: Median sternotomy and CABG. The cardiopericardial silhouette remains enlarged. Mediastinal contours appear similar allowing for rotation and projectional differences on today's examination.  The lungs show pulmonary fibrosis with no convincing evidence of superimposed pneumonia. Based on the pulmonary fibrosis, interstitial pulmonary edema cannot be excluded.  Monitoring leads project over the chest.  IMPRESSION: 1. Unchanged pulmonary fibrosis. 2. No definite superimposed acute cardiopulmonary disease. 3. Unchanged cardiomegaly.  CABG.   Electronically Signed   By: Andreas NewportGeoffrey  Lamke M.D.   On: 06/29/2014 07:46      Assessment/Plan:   Active Problems:   CAD - CABG x 4 in 2006   PAF - not on chronic anticoagulation due to high bleeding risk   HTN (hypertension)   Neurogenic bladder   Acute respiratory failure   CKD (chronic kidney disease) stage 3, GFR 30-59 ml/min   Chronic obstructive pulmonary disease with acute exacerbation    Urinary tract infectious disease   Pulmonary fibrosis   Centrilobular emphysema   Abnormal CXR (chest x-ray)   Persistent atrial fibrillation   H/O aortic dissection   Hx of CABG   Hypoxia  1. AF: now persistent off amiodarone. Agree with rate control strategy for now.  Rate controlled now on metoprolol to 62.5 mg bid. Now off NOAC due to fall risk, continue ASA. If wheezing occurs on increased BB may need to reduce BB and change amlodipine to cardizem 2. Essential HTN: BP better today, currently 151 systolic on amlodipine to 7.5 mg daily and metoprolol  62.5 mg bid 3. Renal Insufficiency: Cr increased to 2.52 today with BUN 97 (on solumedrol);  not on any ACE-I or ARB. Suspect contributed by reduced renal perfusion with diuresis; now Stage 4 with GFR 22;  Consider renal evaluation. 4. Acute on Chronic Diastolic Heart failure;  -9896 net output since admission; repeat BNP 5. CAD: s/p CABG, no chest pain. 6. H/O Aortic dissection/cardiac tamponade 7. PVD 8. Type 2 DM, sugars elevated contributed by steroidd on insulin    Lennette Biharihomas A. Kelly, MD, [facility] Hospital[facility]ACC 06/29/2014, 8:02 AM

## 2014-06-29 NOTE — Progress Notes (Signed)
PULMONARY / CRITICAL CARE MEDICINE   Name: [name] [name] MRN: [id number] DOB: 12/12/1928    ADMISSION DATE:  06/20/2014 CONSULTATION DATE:  06/21/2014  REFERRING MD :   Tat  CHIEF COMPLAINT: Shortness of breath  INITIAL PRESENTATION: 80 year old male with no prior pulmonary history was admitted on 06/20/2014 with acute hypoxemic respiratory failure after an upper respiratory infection syndrome vs amiodarone toxicity (amio d/c'd on 06/21/14 with ESR = 75)   STUDIES:  12/21 CT chest >> there is notable emphysema in the lungs bilaterally with honeycombing in the bases and interstitial thickening, overall picture worrisome for interstitial lung disease superimposed on a background of emphysema. See radiology report for other findings including aorta, status post grafting. 12/22  ECHO >> EF 50-55%, mod LVH, PA Peak 41, dilated descending thoracic aorta 4.4 cm   SIGNIFICANT EVENTS: 12/22  PCCM consulted for hypoxemic respiratory failure 12/23  Remains on 15L O2, started solumedrol; ILD serology labs negative 12/24  O2 requirements down to 5L 12/26 tr to SDU for bipap  SUBJECTIVE:  Stable, no new complaints, O2 demand decreasing.  VITAL SIGNS: Temp:  [97.4 F (36.3 C)-98.1 F (36.7 C)] 97.4 F (36.3 C) (12/30 0335) Pulse Rate:  [51-85] 52 (12/30 0335) Resp:  [17-24] 18 (12/30 0335) BP: (126-164)/(30-66) 151/56 mmHg (12/30 0335) SpO2:  [89 %-100 %] 91 % (12/30 0825) Weight:  [68.584 kg (151 lb 3.2 oz)] 68.584 kg (151 lb 3.2 oz) (12/30 0354)  VENTILATOR SETTINGS:     INTAKE / OUTPUT:  Intake/Output Summary (Last 24 hours) at 06/29/14 0956 Last data filed at 06/29/14 0349  Gross per 24 hour  Intake     90 ml  Output   1275 ml  Net  -1185 ml    PHYSICAL EXAMINATION: General: 1L [place], comfortable from a respiratory standpoint. Neuro: Asleep but easily arousable, alert and interactive, moving all ext to command. HEENT: [place]/AT, OP Clear PULM: Crackles bilaterally on insp/ no  wheeze  CV: RRR, no mgr Ab: BS+, soft Ext; warm, no edema  LABS:  CBC  Recent Labs Lab 06/26/14 0812 06/27/14 0342 06/29/14 0410  WBC 11.8* 10.1 11.7*  HGB 9.0* 9.0* 10.2*  HCT 28.8* 29.1* 32.2*  PLT 257 259 340   Coag's No results for input(s): APTT, INR in the last 168 hours. BMET  Recent Labs Lab 06/27/14 0342 06/28/14 0331 06/29/14 0410  NA 142 142 142  K 4.1 4.2 3.7  CL 111 107 105  CO2 $Re'23 26 27  'gMi$ BUN 72* 81* 97*  CREATININE 1.97* 2.20* 2.50*  GLUCOSE 314* 279* 269*   Electrolytes  Recent Labs Lab 06/27/14 0342 06/28/14 0331 06/29/14 0410  CALCIUM 9.1 9.5 9.1  MG  --   --  2.3  PHOS  --   --  4.2   Sepsis Markers  Recent Labs Lab 06/26/14 0812 06/27/14 0343 06/28/14 0331  PROCALCITON 0.22 0.18 0.17   ABG  Recent Labs Lab 06/26/14 0112  PHART 7.392  PCO2ART 33.2*  PO2ART 200.0*   Liver Enzymes No results for input(s): AST, ALT, ALKPHOS, BILITOT, ALBUMIN in the last 168 hours. Cardiac Enzymes No results for input(s): TROPONINI, PROBNP in the last 168 hours. Glucose  Recent Labs Lab 06/28/14 0718 06/28/14 1211 06/28/14 1213 06/28/14 1751 06/28/14 2120 06/29/14 0800  GLUCAP 274* 461* 483* 303* 448* 293*   Lab Results  Component Value Date   ESRSEDRATE 75* 06/21/2014    Imaging  ASSESSMENT / PLAN:  PULMONARY A:  Acute hypoxemic respiratory failure:  See previous discussion for DDX, Given improvement with steroids and holding amiodarone (and lack of response to antibiotics and diuretics), c/w amiodarone pulmonary toxicity  Severe underlying emphysema, not in exacerbation PAH - pa peak 41 on ECHO P:   - Change back to PO prednisone at 40 mg PO daily with slow taper over the next 8-12 wks. - Continue O2 as needed for saturations > 88%. - Ambulatory desaturation study on room air, if sat <=88% then will need home O2. - Amiodarone permanently d/c'd 12/22 - Recommend d/c of further diureses at this point, I believe patient is  at dry weight.  CARDIOVASCULAR A:  Acute diastolic heart failure? Has elevated JVD, proBNP Afib  Hypertensive in the 170. HR is more controlled this AM (60-70 when asleep). P:  - Continue to hold amiodarone (forever). - No anti-coagulation given fall risk. - May need cardiology to see him after stopping amiodarone (follows with Dr. Claiborne Billings), rate is adequately controlled for now. - Increased norvasc to 7.5 mg PO daily per cardiology's note.  INFECTIOUS U. Legionella 12/22 > neg  RVF 12/22 >> neg  BCx2 12/21 >> neg A:  Don't think he has HCAP Remains on cefuroxime per primary MD. P: PCT today is 0.17 Cefuroxime off.  O2 level acceptable, renal function continues to deteriorate, recommend d/c of lasix, abx off, ambulatory desaturation ordered, recommend f/u with PCCM as outpatient to be arranged prior to discharge.  PCCM will sign off, please call back if needed.   Rush Farmer, M.D. [facility] Pulmonary/Critical Care Medicine. Pager: [phone]. After hours pager: [phone].

## 2014-06-29 NOTE — Telephone Encounter (Signed)
Called, spoke with pt's wife who reports pt is still admitted.  States he's had a lot of complications during this hospital stay and was finally sent back to regular floor yesterday.  States pt is "so weak" and will need rehab after d/c.  She is unsure how she will have him come in for HFU d/t weakness at this time.  Wife would like to call back once she has a more definative d/c date to schedule HFU.  Advised pt will need to have cxr same day as HFU.  She verbalized understanding and voiced no further questions or concerns at this time.  Will route to BQ as fyi.

## 2014-06-29 NOTE — Progress Notes (Signed)
PROGRESS NOTE  [name] [name] QMV:[id number] DOB: 10/04/1928 DOA: 06/20/2014 PCP: [name],[name], MD  Assessment/Plan: Acute respiratory failure -Likely multifactorial with the possibility of pulmonary edema, pneumonia (HCAP), and consideration for underlying ILD or BOOP/ Amiodarone pulmonary toxicity -06/25/14 evening-[name] [name] developed respiratory distress -06/26/2014 chest x-ray--worsening diffuse interstitial infiltrates -06/25/2014--intravenous Solu-Medrol restarted by pulmonary - at time of admission, The [name] [name] did not have any significant clinical improvement with intravenous furosemide x 4 doses or abx -appreciate pulmonary followup-->plan to switch to oral prednisone on 06/29/2014 if medically stable -Presently stable on 2 L nasal cannula -06/26/2014--restarted furosemide IV -[name] [name] has diuresed 10 L with furosemide IV -Discharge to skilled nursing facility 06/30/2014 if okay with pulmonary  Pulmonary opacities/infiltrates -No significant clinical improvement with intravenous furosemide 4 doses initially at time of admission -since no clear signs of lung infection will narrow antibiotics to cover only UTI -Blood cultures 2 sets--Unfortunately, this was not initially ordered, and Zosyn was given prior to collection of the blood cultures -check procalcitonin-->1.33-->0.53-->0.18 -Continue bronchodilators -06/26/2014 chest x-ray shows worsening bilateral interstitial opacities--> redosed intravenous furosemide  Elevated proBNP/Acute diastolic CHF  -Although the [name] [name] has CKD stage III which may elevate BNP, this is more elevated than his prior admission  -Although the [name] [name] has some JVD on examination, he does not appear to be floridly fluid overloaded  -11/02/13 Echo did not show any RV dysfunction, TR, or pulm HTN  -06/21/2014-- echocardiogram EF 50-55%, increased EDP, mild TR, PAP 41 -I/O's, daily weights -low sodium diet -06/26/2014--> diuresed 10  L -Decrease furosemide to 20 mg IV twice a day--06/28/2014 is last day -plan to start po lasix 06/30/14 if stable  Paroxysmal Atrial fibrillation  -Rate controlled  -back in Afib after initial sinus -Discontinue apixiban given the [name] [name]'s recent history of multiple falls  -continue aspirin  -discontinue amiodarone, due to concerns for pulmonary toxicity -appreciate cardiology--> metoprolol dose increased to 62.5 mg twice a day, amlodipine increased to 7.5 mg daily Pyuria -TNTC WBC in urine  -continue zosyn; will narrow to ceftin for 5 more days to complete tx for UTI -unfortunately, urine culture was not sent from ED -had 8 days antibiotics  -d/c all abx  Acute on CKD stage III  -Baseline creatinine 1.7-2.1  -due to diuresis and presumed UTI -creatinine increase to 2.5 with diuresis -follow renal function trend  Altered mental status -likely due to infection/hypoxemia -resolved now -The [name] [name] is now A&Ox3  -CT brain neg for acute findings  COPD  -continue bronchodilators -no wheezing presently -per PCCM will continue steroids for potential ILD  Diabetes mellitus type 2  -05/18/2014 hemoglobin A1c 6.5  -CBGs elevated secondary to steroids, increase Lantus to 365 units  -NovoLog sliding scale  -Add NovoLog 4 units with meals -will not change insulin today as pt is transitioning to po steroids -monitor CBGs on oral steroids and adjust accordingly.  Hypertension  -Continue metoprolol tartrate--increase to 62.5 mg twice a day -Hold Cardura -will continue holding lasix given recent worsening on renal function -added amlodipine coronary artery disease  -[name] [name] had CABG in 2006 -Currently chest pain-free -Continue metoprolol tartrate   Thoracic Aortic Aneursym -s/p repair -changes on CT likely due to previous repair -continue outpatient follow up  Constipation -colace and bisacodyl -will use enema if no improvement  Family Communication: wife  updated at bedside Disposition Plan: SNF on 06/30/14 if okay with pulm and cards  Procedures/Studies: Ct Abdomen Pelvis Wo Contrast  06/20/2014   CLINICAL DATA:  Fall in bathroom. Anti coagulation. Increased confusion.  EXAM: CT CHEST, ABDOMEN AND PELVIS WITHOUT CONTRAST  TECHNIQUE: Multidetector CT imaging of the chest, abdomen and pelvis was performed following the standard protocol without IV contrast.  COMPARISON:  Multiple exams, including 05/19/2014 and 03/26/2005  FINDINGS: CT CHEST FINDINGS  Right upper paratracheal node 1.2 cm in short axis, image 12 series 2. Subcarinal node 1.4 cm in short axis, image 34 series 2. Additional paratracheal nodes are present. If hilar nodes are enlarged there difficult to measure against the background vasculature.  Aortic and branch vessel atherosclerosis. Prior CABG. Ascending thoracic aorta 4.6 cm transverse ; descending thoracic aorta 4.6 cm transverse. Lower descending thoracic aorta 5.0 cm transverse. There is mixed density surrounding tubular calcification in the ascending thoracic aorta potentially from chronic dissection, chronic mural thrombus, or even intramural hematoma. Some of this appearance could be from an ascending aortic graft-correlate with [name] [name] history. Without IV contrast did is difficult to characterize this further.  Trace left pleural effusion. Mild enlargement of the cardiopericardial silhouette  Emphysema is present with scattered scarring and peripheral fibrosis potentially with some early honeycombing at the lung bases. In addition, there is a 2.4 by 1.4 cm nodule anteriorly in the right upper lobe, image 25 of series 5 interstitial accentuation is present bilaterally.  CT ABDOMEN AND PELVIS FINDINGS  Hepatobiliary: Unremarkable  Pancreas: Speckled calcifications throughout the pancreatic parenchyma compatible with chronic calcific pancreatitis.  Spleen: Unremarkable  Adrenals/Urinary Tract: 8 mm left kidney  lower pole nonobstructive calculus. Bilateral fluid density renal lesions favoring cysts. No hydronephrosis or hydroureter. No ureteral calculi.  Stomach/Bowel: Prominent stool throughout the colon favors constipation.  Vascular/Lymphatic: Dense aortoiliac atherosclerosis.  Reproductive: Enlarged prostate gland with scattered calcifications, measuring 5.8 by 4.6 cm.  Other: No supplemental non-categorized findings.  Musculoskeletal: Bridging fusion of the sacroiliac joints. posterolateral rod and pedicle screw fixation at L4-5 with interbody and anterior facet fusion. Intervertebral and facet spurring at L3-4 and L5-S1. Small bilateral inguinal hernias contain adipose tissue.  IMPRESSION: 1. 2.4 by 1.4 cm right upper lobe pulmonary nodule with right paratracheal adenopathy, concerning for malignancy. An atypical infectious process could potentially give this appearance. Consider nuclear medicine PET-CT. 2. Severe emphysema with peripheral fibrosis, interstitial accentuation, and some basilar honeycombing. 3. Ascending and descending thoracic aortic aneurysms measuring up to 5 cm transversely. There is a mixed density in the ascending aortic aneurysm with a complex appearance, possibly from graft material and excluded thrombus, chronic mural thrombus, chronic dissection, or intramural hematoma. Correlate with operative history. If IV contrast could not be administered, MRI might help further assess the thoracic aorta. Otherwise I recommend semi-annual imaging followup by CTA or MRA and referral to cardiothoracic surgery if not already obtained. This recommendation follows 2010 ACCF/AHA/AATS/ACR/ASA/SCA/SCAI/SIR/STS/SVM Guidelines for the Diagnosis and Management of Patients With Thoracic Aortic Disease. Circulation. 2010; 121: [id number]. 4. Trace left pleural effusion. 5. Chronic calcific pancreatitis. 6.  Prominent stool throughout the colon favors constipation. 7. Enlarged prostate gland. 8. Lumbar spondylosis  particularly at L3-4 and L5-S1.   Electronically Signed   By: Herbie BaltimoreWalt  Liebkemann M.D.   On: 06/20/2014 13:58   Ct Head Wo Contrast  06/20/2014   CLINICAL DATA:  Fall.  Hypoxia.  EXAM: CT HEAD WITHOUT CONTRAST  CT CERVICAL SPINE WITHOUT CONTRAST  TECHNIQUE: Multidetector CT imaging of the head and cervical spine was performed following the standard protocol without intravenous contrast. Multiplanar CT  image reconstructions of the cervical spine were also generated.  COMPARISON:  Report from 11/24/2000  FINDINGS: CT HEAD FINDINGS  Hypodense lesion in the right middle cranial fossa tracking up into the sylvian fissure, fluid density, probably an arachnoid cyst or epidermoid. Size 4.0 by 3.2 by approximately 6.0 cm.  Atherosclerosis noted. The brainstem, cerebellum, cerebral peduncle sits, thalami, and basal ganglia appear unremarkable. The ventricular system appears normal and aside from the right middle cranial fossa lesion the basilar cisterns appear normal.  Periventricular white matter and corona radiata hypodensities favor chronic ischemic microvascular white matter disease. No intracranial hemorrhage, mass lesion, or acute CVA. There is a subcutaneous lesion along the occiput/upper neck in the midline on image 1 of series 3 which could represent subcutaneous bruising. Chronic left frontal sinusitis.  CT CERVICAL SPINE FINDINGS  Considerable degenerative findings at the craniocervical junction and anterior arch of C1-2 with extensive spurring of the basion and pannus formation posterior to the odontoid. Prominent loss of articular space at the anterior C1-2 articulation.  Uncinate and facet spurring cause osseous foraminal stenosis on the right at C2-3 and on the left at C3-4, C4-5, and C6-7. There is interbody fusion at the C5-6 low-level and facet fusion on the left at C4-5 and C7-T1.  There is 2 mm degenerative anterolisthesis at C4-5 and at C7-T1. Degenerative endplate sclerosis noted with posterior osseous  ridging at C6-7, C7-T1, and T1-2.  No prevertebral soft tissue swelling. No acute fracture is identified. There is dextroconvex cervical scoliosis.  Prominent emphysema and scarring noted at the lung apices.  IMPRESSION: 1. Right middle cranial fossa arachnoid cyst or epidermoid tracking into the sylvian fissure. MRI can typically differentiate between these 2 entities if clinically warranted. 2. Atherosclerosis. 3. Dense subcutaneous lesion along the occiput but and upper neck could represent some focal bruising. 4. Chronic left frontal sinusitis. 5. Cervical spondylosis causes multilevel osseous foraminal impingement. 6. Considerable spurring at the C1- 2 articulation and along the basion, with pannus formation posterior to the odontoid-rheumatoid arthropathy not excluded. 7. Emphysema.   Electronically Signed   By: Herbie Baltimore M.D.   On: 06/20/2014 13:36   Ct Chest Wo Contrast  06/20/2014   CLINICAL DATA:  Fall in bathroom. Anti coagulation. Increased confusion.  EXAM: CT CHEST, ABDOMEN AND PELVIS WITHOUT CONTRAST  TECHNIQUE: Multidetector CT imaging of the chest, abdomen and pelvis was performed following the standard protocol without IV contrast.  COMPARISON:  Multiple exams, including 05/19/2014 and 03/26/2005  FINDINGS: CT CHEST FINDINGS  Right upper paratracheal node 1.2 cm in short axis, image 12 series 2. Subcarinal node 1.4 cm in short axis, image 34 series 2. Additional paratracheal nodes are present. If hilar nodes are enlarged there difficult to measure against the background vasculature.  Aortic and branch vessel atherosclerosis. Prior CABG. Ascending thoracic aorta 4.6 cm transverse ; descending thoracic aorta 4.6 cm transverse. Lower descending thoracic aorta 5.0 cm transverse. There is mixed density surrounding tubular calcification in the ascending thoracic aorta potentially from chronic dissection, chronic mural thrombus, or even intramural hematoma. Some of this appearance could be from  an ascending aortic graft-correlate with [name] [name] history. Without IV contrast did is difficult to characterize this further.  Trace left pleural effusion. Mild enlargement of the cardiopericardial silhouette  Emphysema is present with scattered scarring and peripheral fibrosis potentially with some early honeycombing at the lung bases. In addition, there is a 2.4 by 1.4 cm nodule anteriorly in the right upper lobe, image 25 of series 5  interstitial accentuation is present bilaterally.  CT ABDOMEN AND PELVIS FINDINGS  Hepatobiliary: Unremarkable  Pancreas: Speckled calcifications throughout the pancreatic parenchyma compatible with chronic calcific pancreatitis.  Spleen: Unremarkable  Adrenals/Urinary Tract: 8 mm left kidney lower pole nonobstructive calculus. Bilateral fluid density renal lesions favoring cysts. No hydronephrosis or hydroureter. No ureteral calculi.  Stomach/Bowel: Prominent stool throughout the colon favors constipation.  Vascular/Lymphatic: Dense aortoiliac atherosclerosis.  Reproductive: Enlarged prostate gland with scattered calcifications, measuring 5.8 by 4.6 cm.  Other: No supplemental non-categorized findings.  Musculoskeletal: Bridging fusion of the sacroiliac joints. posterolateral rod and pedicle screw fixation at L4-5 with interbody and anterior facet fusion. Intervertebral and facet spurring at L3-4 and L5-S1. Small bilateral inguinal hernias contain adipose tissue.  IMPRESSION: 1. 2.4 by 1.4 cm right upper lobe pulmonary nodule with right paratracheal adenopathy, concerning for malignancy. An atypical infectious process could potentially give this appearance. Consider nuclear medicine PET-CT. 2. Severe emphysema with peripheral fibrosis, interstitial accentuation, and some basilar honeycombing. 3. Ascending and descending thoracic aortic aneurysms measuring up to 5 cm transversely. There is a mixed density in the ascending aortic aneurysm with a complex appearance, possibly from graft  material and excluded thrombus, chronic mural thrombus, chronic dissection, or intramural hematoma. Correlate with operative history. If IV contrast could not be administered, MRI might help further assess the thoracic aorta. Otherwise I recommend semi-annual imaging followup by CTA or MRA and referral to cardiothoracic surgery if not already obtained. This recommendation follows 2010 ACCF/AHA/AATS/ACR/ASA/SCA/SCAI/SIR/STS/SVM Guidelines for the Diagnosis and Management of Patients With Thoracic Aortic Disease. Circulation. 2010; 121: Z610-R604. 4. Trace left pleural effusion. 5. Chronic calcific pancreatitis. 6.  Prominent stool throughout the colon favors constipation. 7. Enlarged prostate gland. 8. Lumbar spondylosis particularly at L3-4 and L5-S1.   Electronically Signed   By: Herbie Baltimore M.D.   On: 06/20/2014 13:58   Ct Cervical Spine Wo Contrast  06/20/2014   CLINICAL DATA:  Fall.  Hypoxia.  EXAM: CT HEAD WITHOUT CONTRAST  CT CERVICAL SPINE WITHOUT CONTRAST  TECHNIQUE: Multidetector CT imaging of the head and cervical spine was performed following the standard protocol without intravenous contrast. Multiplanar CT image reconstructions of the cervical spine were also generated.  COMPARISON:  Report from 11/24/2000  FINDINGS: CT HEAD FINDINGS  Hypodense lesion in the right middle cranial fossa tracking up into the sylvian fissure, fluid density, probably an arachnoid cyst or epidermoid. Size 4.0 by 3.2 by approximately 6.0 cm.  Atherosclerosis noted. The brainstem, cerebellum, cerebral peduncle sits, thalami, and basal ganglia appear unremarkable. The ventricular system appears normal and aside from the right middle cranial fossa lesion the basilar cisterns appear normal.  Periventricular white matter and corona radiata hypodensities favor chronic ischemic microvascular white matter disease. No intracranial hemorrhage, mass lesion, or acute CVA. There is a subcutaneous lesion along the occiput/upper  neck in the midline on image 1 of series 3 which could represent subcutaneous bruising. Chronic left frontal sinusitis.  CT CERVICAL SPINE FINDINGS  Considerable degenerative findings at the craniocervical junction and anterior arch of C1-2 with extensive spurring of the basion and pannus formation posterior to the odontoid. Prominent loss of articular space at the anterior C1-2 articulation.  Uncinate and facet spurring cause osseous foraminal stenosis on the right at C2-3 and on the left at C3-4, C4-5, and C6-7. There is interbody fusion at the C5-6 low-level and facet fusion on the left at C4-5 and C7-T1.  There is 2 mm degenerative anterolisthesis at C4-5 and at C7-T1. Degenerative endplate  sclerosis noted with posterior osseous ridging at C6-7, C7-T1, and T1-2.  No prevertebral soft tissue swelling. No acute fracture is identified. There is dextroconvex cervical scoliosis.  Prominent emphysema and scarring noted at the lung apices.  IMPRESSION: 1. Right middle cranial fossa arachnoid cyst or epidermoid tracking into the sylvian fissure. MRI can typically differentiate between these 2 entities if clinically warranted. 2. Atherosclerosis. 3. Dense subcutaneous lesion along the occiput but and upper neck could represent some focal bruising. 4. Chronic left frontal sinusitis. 5. Cervical spondylosis causes multilevel osseous foraminal impingement. 6. Considerable spurring at the C1- 2 articulation and along the basion, with pannus formation posterior to the odontoid-rheumatoid arthropathy not excluded. 7. Emphysema.   Electronically Signed   By: Herbie Baltimore M.D.   On: 06/20/2014 13:36   Dg Chest Port 1 View  06/29/2014   CLINICAL DATA:  Pulmonary infiltrates.  Respiratory failure.  EXAM: PORTABLE CHEST - 1 VIEW  COMPARISON:  06/27/2014.  FINDINGS: Median sternotomy and CABG. The cardiopericardial silhouette remains enlarged. Mediastinal contours appear similar allowing for rotation and projectional  differences on today's examination.  The lungs show pulmonary fibrosis with no convincing evidence of superimposed pneumonia. Based on the pulmonary fibrosis, interstitial pulmonary edema cannot be excluded.  Monitoring leads project over the chest.  IMPRESSION: 1. Unchanged pulmonary fibrosis. 2. No definite superimposed acute cardiopulmonary disease. 3. Unchanged cardiomegaly.  CABG.   Electronically Signed   By: Andreas Newport M.D.   On: 06/29/2014 07:46   Dg Chest Port 1 View  06/27/2014   CLINICAL DATA:  Acute respiratory failure.  EXAM: PORTABLE CHEST - 1 VIEW  COMPARISON:  06/25/2014, 06/23/2014, 03/29/2013.  CT 06/20/2014.  FINDINGS: Mediastinum hilar structures normal. Prior CABG. Stable cardiomegaly with normal pulmonary vascularity. Persistent dense bilateral pulmonary interstitial infiltrates. Known right upper lobe pulmonary nodule better characterized by recent CT. No pleural effusion or pneumothorax. No acute osseus abnormality.  IMPRESSION: 1. Persistent dense bilateral pulmonary interstitial infiltrates. 2. Known right upper lobe pulmonary nodule better characterized by CT of 06/20/2014. 3. Prior CABG.  Heart size is stable.  Pulmonary vascularity normal.   Electronically Signed   By: Maisie Fus  Register   On: 06/27/2014 07:19   Dg Chest Port 1 View  06/25/2014   CLINICAL DATA:  Acute onset of hypoxia and worsening shortness of breath. Initial encounter.  EXAM: PORTABLE CHEST - 1 VIEW  COMPARISON:  Chest radiograph performed 06/23/2014, and CT of the chest performed 06/20/2014  FINDINGS: There is mildly worsened diffuse interstitial prominence, raising suspicion for mild pulmonary edema or atypical pneumonia, superimposed on the [name] [name]'s chronic emphysema, fibrosis and interstitial lung disease. The known right upper lobe pulmonary nodule is better characterized on recent CT. No definite pleural effusion or pneumothorax is seen.  The cardiomediastinal silhouette remains normal in size. The  [name] [name] is status post median sternotomy, with evidence of prior CABG. No acute osseous abnormalities are seen.  IMPRESSION: 1. Mildly worsened diffuse interstitial prominence, raising suspicion for mild pulmonary edema or atypical pneumonia, superimposed on the [name] [name]'s chronic emphysema, fibrosis and interstitial lung disease. 2. Known right upper lobe pulmonary nodule is better characterized on recent CT.   Electronically Signed   By: Roanna Raider M.D.   On: 06/25/2014 21:53   Dg Chest Port 1 View  06/23/2014   CLINICAL DATA:  Acute respiratory failure, hypoxia  EXAM: PORTABLE CHEST - 1 VIEW  COMPARISON:  06/20/2014  FINDINGS: Cardiomediastinal silhouette is stable. Again noted reticular diffuse interstitial prominence bilaterally probable  due to chronic interstitial lung disease and fibrotic changes. No definite superimposed infiltrate or pulmonary edema. Poorly visualized nodule in right upper lobe again noted measures about 2 cm. This was better visualized on recent CT scan.  IMPRESSION: Again noted diffuse reticular interstitial prominence probable due to chronic interstitial lung disease and fibrotic changes. No definite superimposed infiltrate. Again noted nodule in right upper lobe medially. This was better visualized on recent CT scan.   Electronically Signed   By: Natasha Mead M.D.   On: 06/23/2014 11:01   Dg Chest Portable 1 View  06/20/2014   CLINICAL DATA:  Fall.  Pneumonia.  Shortness of breath.  Confusion.  EXAM: PORTABLE CHEST - 1 VIEW  COMPARISON:  05/19/2014  FINDINGS: Diffuse interstitial and patchy airspace opacities are present superimposed on emphysema. Atherosclerotic aortic arch and prior CABG. Heart size within normal limits for projection. Stable biapical pleural parenchymal scarring. No pneumothorax observed. No definite blunting of the costophrenic angles.  IMPRESSION: 1. Bilateral interstitial and airspace opacities, worsened, superimposed on severe emphysema. Appearance could  reflect edema or atypical pneumonia.   Electronically Signed   By: Herbie Baltimore M.D.   On: 06/20/2014 12:09         Subjective:   Objective: Filed Vitals:   06/29/14 0354 06/29/14 0825 06/29/14 1344 06/29/14 1556  BP:   133/34   Pulse:   53   Temp:   97.8 F (36.6 C)   TempSrc:   Oral   Resp:   20   Height:      Weight: 68.584 kg (151 lb 3.2 oz)     SpO2:  91% 92% 92%    Intake/Output Summary (Last 24 hours) at 06/29/14 1851 Last data filed at 06/29/14 1300  Gross per 24 hour  Intake    480 ml  Output    725 ml  Net   -245 ml   Weight change: -0.116 kg (-4.1 oz) Exam:   General:  Pt is alert, follows commands appropriately, not in acute distress  HEENT: No icterus, No thrush, No neck mass, [place]/AT  Cardiovascular: RRR, S1/S2, no rubs, no gallops  Respiratory: CTA bilaterally, no wheezing, no crackles, no rhonchi  Abdomen: Soft/+BS, non tender, non distended, no guarding  Extremities: No edema, No lymphangitis, No petechiae, No rashes, no synovitis  Data Reviewed: Basic Metabolic Panel:  Recent Labs Lab 06/25/14 0502 06/26/14 0812 06/27/14 0342 06/28/14 0331 06/29/14 0410  NA 138 139 142 142 142  K 4.7 4.4 4.1 4.2 3.7  CL 112 113* 111 107 105  CO2 21 21 23 26 27   GLUCOSE 383* 274* 314* 279* 269*  BUN 76* 66* 72* 81* 97*  CREATININE 2.35* 1.91* 1.97* 2.20* 2.50*  CALCIUM 8.4 8.9 9.1 9.5 9.1  MG  --   --   --   --  2.3  PHOS  --   --   --   --  4.2   Liver Function Tests: No results for input(s): AST, ALT, ALKPHOS, BILITOT, PROT, ALBUMIN in the last 168 hours. No results for input(s): LIPASE, AMYLASE in the last 168 hours. No results for input(s): AMMONIA in the last 168 hours. CBC:  Recent Labs Lab 06/25/14 0502 06/26/14 0812 06/27/14 0342 06/29/14 0410  WBC 9.9 11.8* 10.1 11.7*  HGB 7.9* 9.0* 9.0* 10.2*  HCT 25.7* 28.8* 29.1* 32.2*  MCV 92.8 91.1 89.8 90.2  PLT 220 257 259 340   Cardiac Enzymes: No results for input(s): CKTOTAL,  CKMB, CKMBINDEX, TROPONINI  in the last 168 hours. BNP: Invalid input(s): POCBNP CBG:  Recent Labs Lab 06/28/14 1751 06/28/14 2120 06/29/14 0800 06/29/14 1215 06/29/14 1654  GLUCAP 303* 448* 293* 367* 279*    Recent Results (from the past 240 hour(s))  Culture, blood (routine x 2)     Status: None   Collection Time: 06/20/14  5:42 PM  Result Value Ref Range Status   Specimen Description BLOOD RIGHT ARM  Final   Special Requests BOTTLES DRAWN AEROBIC AND ANAEROBIC 10CC  Final   Culture  Setup Time   Final    06/21/2014 02:53 Performed at [facility]    Culture   Final    NO GROWTH 5 DAYS Performed at [facility]    Report Status 06/27/2014 FINAL  Final  Culture, blood (routine x 2)     Status: None   Collection Time: 06/20/14  5:50 PM  Result Value Ref Range Status   Specimen Description BLOOD LEFT HAND  Final   Special Requests BOTTLES DRAWN AEROBIC ONLY 5CC  Final   Culture  Setup Time   Final    06/21/2014 02:56 Performed at [facility]    Culture   Final    NO GROWTH 5 DAYS Performed at [facility]    Report Status 06/27/2014 FINAL  Final  MRSA PCR Screening     Status: None   Collection Time: 06/26/14  6:53 AM  Result Value Ref Range Status   MRSA by PCR NEGATIVE NEGATIVE Final    Comment:        The GeneXpert MRSA Assay (FDA approved for NASAL specimens only), is one component of a comprehensive MRSA colonization surveillance program. It is not intended to diagnose MRSA infection nor to guide or monitor treatment for MRSA infections.   Urine culture     Status: None   Collection Time: 06/26/14  6:23 PM  Result Value Ref Range Status   Specimen Description URINE, CATHETERIZED  Final   Special Requests NONE  Final   Colony Count NO GROWTH Performed at [facility]   Final   Culture NO GROWTH Performed at [facility]   Final   Report Status 06/28/2014 FINAL  Final     Scheduled Meds: .  amLODipine  7.5 mg Oral Daily  . antiseptic oral rinse  7 mL Mouth Rinse q12n4p  . aspirin EC  81 mg Oral Q breakfast  . bisacodyl  10 mg Rectal Once  . chlorhexidine  15 mL Mouth Rinse BID  . docusate sodium  100 mg Oral BID  . ezetimibe  10 mg Oral Q breakfast  . ferrous sulfate  325 mg Oral Q breakfast  . gabapentin  300 mg Oral Q24H  . gabapentin  300 mg Oral BID  . heparin  5,000 Units Subcutaneous 3 times per day  . insulin aspart  0-15 Units Subcutaneous TID WC  . insulin aspart  4 Units Subcutaneous TID WC  . insulin glargine  35 Units Subcutaneous QHS  . ipratropium-albuterol  3 mL Nebulization TID  . metoprolol  62.5 mg Oral BID  . oxyCODONE-acetaminophen  1 tablet Oral Q24H  . oxyCODONE-acetaminophen  2 tablet Oral BID  . [START ON 06/30/2014] predniSONE  40 mg Oral Q breakfast  . sodium chloride  3 mL Intravenous Q12H   Continuous Infusions:    Jessilyn Catino, DO  [facility] Pager [phone]  If 7PM-7AM, please contact night-coverage [email or web address] Password [facility] 06/29/2014, 6:51 PM  LOS: 9 days

## 2014-06-29 NOTE — Progress Notes (Signed)
Clinical Social Work Department BRIEF PSYCHOSOCIAL ASSESSMENT 06/29/2014  Patient:  [name] [name],[name] [name]     Account Number:  [id number]     Admit date:  06/20/2014  Clinical Social Worker:  Orpah GreekFOLEY,Kiona Blume, LCSWA  Date/Time:  06/29/2014 03:33 PM  Referred by:  Physician  Date Referred:  06/29/2014 Referred for  SNF Placement   Other Referral:   Interview type:  Patient Other interview type:    PSYCHOSOCIAL DATA Living Status:  WIFE Admitted from facility:   Level of care:   Primary support name:  Barnetta ChapelKaren Marinaccio (wife) h#: [phone] Primary support relationship to patient:  SPOUSE Degree of support available:   good    CURRENT CONCERNS Current Concerns  Post-Acute Placement   Other Concerns:    SOCIAL WORK ASSESSMENT / PLAN CSW reviewed PT evaluation recommending SNF at discharge.   Assessment/plan status:  [facility] to [facility] Other assessment/ plan:   Information/referral to community resources:   CSW completed FL2 and [facility] - provided bed offers.    PATIENT'S/FAMILY'S RESPONSE TO PLAN OF CARE: Patient & [facility]. Tammy @ SNF aware. Wife states that she is unable to care for him at this time until he gets his strength back.         Lincoln MaxinKelly Ardis Fullwood, LCSW [facility] Clinical Social Worker cell #: [phone]

## 2014-06-29 NOTE — Progress Notes (Signed)
Patient has [name] bed [facility].   Clinical Social Work Department CLINICAL SOCIAL WORK PLACEMENT NOTE 06/29/2014  Patient:  [name] [name],[name] [name]  Account Number:  [id number] Admit date:  06/20/2014  Clinical Social Worker:  Orpah GreekKELLY FOLEY, LCSWA  Date/time:  06/29/2014 03:43 PM  Clinical Social Work is seeking post-discharge placement for this patient at the following level of care:   SKILLED NURSING   (*CSW will update this form in Epic as items are completed)   06/29/2014  Patient/family provided with [facility] Department of Clinical Social Work's list of facilities offering this level of care within the geographic area requested by the patient (or if unable, by the patient's family).  06/29/2014  Patient/family informed of their freedom to choose among providers that offer the needed level of care, that participate in Medicare, Medicaid or managed care program needed by the patient, have an available bed and are willing to accept the patient.  06/29/2014  Patient/family informed of [facility]' ownership interest in [facility], as well as of the fact that they are under no obligation to receive care at this facility.  PASARR submitted to EDS on 06/29/2014 PASARR number received on 06/29/2014  FL2 transmitted to all facilities in geographic area requested by pt/family on  06/29/2014 FL2 transmitted to all facilities within larger geographic area on   Patient informed that his/her managed care company has contracts with or will negotiate with  certain facilities, including the following:     Patient/family informed of bed offers received:  06/29/2014 [facility] [place] Physician recommends and patient chooses bed at    [facility] [place]   Patient to be transferred to facility by  Patient and family notified of transfer on  Name of family member notified:    The  following physician request were entered in Epic:   Additional Comments:   Lincoln MaxinKelly Shruti Arrey, LCSW [facility] Clinical Social Worker cell #: [phone]

## 2014-06-30 LAB — BASIC METABOLIC PANEL
Anion gap: 9 (ref 5–15)
BUN: 98 mg/dL — AB (ref 6–23)
CALCIUM: 8.7 mg/dL (ref 8.4–10.5)
CO2: 27 mmol/L (ref 19–32)
CREATININE: 2.48 mg/dL — AB (ref 0.50–1.35)
Chloride: 107 mEq/L (ref 96–112)
GFR calc Af Amer: 26 mL/min — ABNORMAL LOW (ref >90)
GFR, EST NON AFRICAN AMERICAN: 22 mL/min — AB (ref >90)
GLUCOSE: 96 mg/dL (ref 70–99)
Potassium: 3.6 mmol/L (ref 3.5–5.1)
SODIUM: 143 mmol/L (ref 135–145)

## 2014-06-30 LAB — BRAIN NATRIURETIC PEPTIDE: B Natriuretic Peptide: 117.3 pg/mL — ABNORMAL HIGH (ref 0.0–100.0)

## 2014-06-30 LAB — GLUCOSE, CAPILLARY
Glucose-Capillary: 41 mg/dL — CL (ref 70–99)
Glucose-Capillary: 70 mg/dL (ref 70–99)

## 2014-06-30 LAB — MAGNESIUM: Magnesium: 2.4 mg/dL (ref 1.5–2.5)

## 2014-06-30 MED ORDER — INSULIN GLARGINE 100 UNIT/ML ~~LOC~~ SOLN
20.0000 [IU] | Freq: Every day | SUBCUTANEOUS | Status: DC
Start: 2014-06-30 — End: 2014-06-30

## 2014-06-30 MED ORDER — DSS 100 MG PO CAPS: 100.0000 mg | ORAL_CAPSULE | Freq: Two times a day (BID) | ORAL | Status: AC

## 2014-06-30 MED ORDER — METOPROLOL TARTRATE 50 MG PO TABS: 50.0000 mg | ORAL_TABLET | Freq: Two times a day (BID) | ORAL | Status: DC

## 2014-06-30 MED ORDER — FUROSEMIDE 20 MG PO TABS: 20.0000 mg | ORAL_TABLET | Freq: Every day | ORAL | Status: DC

## 2014-06-30 MED ORDER — OXYCODONE-ACETAMINOPHEN 5-325 MG PO TABS
1.0000 | ORAL_TABLET | ORAL | Status: DC
Start: 2014-06-30 — End: 2014-07-05

## 2014-06-30 MED ORDER — POTASSIUM CHLORIDE CRYS ER 10 MEQ PO TBCR: 10.0000 meq | EXTENDED_RELEASE_TABLET | Freq: Every day | ORAL | Status: AC

## 2014-06-30 MED ORDER — GABAPENTIN 300 MG PO CAPS: 300.0000 mg | ORAL_CAPSULE | Freq: Two times a day (BID) | ORAL | Status: AC

## 2014-06-30 MED ORDER — POTASSIUM CHLORIDE CRYS ER 10 MEQ PO TBCR
10.0000 meq | EXTENDED_RELEASE_TABLET | Freq: Every day | ORAL | Status: DC
  Administered 2014-06-30: 10 meq via ORAL
  Filled 2014-06-30: qty 1

## 2014-06-30 MED ORDER — PREDNISONE 20 MG PO TABS: 40.0000 mg | ORAL_TABLET | Freq: Every day | ORAL | Status: AC

## 2014-06-30 MED ORDER — FUROSEMIDE 20 MG PO TABS
20.0000 mg | ORAL_TABLET | Freq: Every day | ORAL | Status: AC
Start: 2014-07-01 — End: ?

## 2014-06-30 MED ORDER — AMLODIPINE BESYLATE 2.5 MG PO TABS: 7.5000 mg | ORAL_TABLET | Freq: Every day | ORAL | Status: AC

## 2014-06-30 NOTE — Progress Notes (Signed)
[facility]. Maeola Harmanark, Deshonda Cryderman Johnson

## 2014-06-30 NOTE — Progress Notes (Signed)
[facility]. Patient & wife, [name] BraunKaren aware. Discharge packet given to RN, Annabelle Harmanana. [facility] called for transport.   Clinical Social Work Department CLINICAL SOCIAL WORK PLACEMENT NOTE 06/30/2014  Patient:  [name] [name],[name] [name]  Account Number:  [id number] Admit date:  06/20/2014  Clinical Social Worker:  Orpah GreekKELLY FOLEY, LCSWA  Date/time:  06/29/2014 03:43 PM  Clinical Social Work is seeking post-discharge placement for this patient at the following level of care:   SKILLED NURSING   (*CSW will update this form in Epic as items are completed)   06/29/2014  Patient/family provided with [facility] Department of Clinical Social Work's list of facilities offering this level of care within the geographic area requested by the patient (or if unable, by the patient's family).  06/29/2014  Patient/family informed of their freedom to choose among providers that offer the needed level of care, that participate in Medicare, Medicaid or managed care program needed by the patient, have an available bed and are willing to accept the patient.  06/29/2014  Patient/family informed of [facility] ownership interest in [facility], as well as of the fact that they are under no obligation to receive care at this facility.  PASARR submitted to EDS on 06/29/2014 PASARR number received on 06/29/2014  FL2 transmitted to all facilities in geographic area requested by pt/family on  06/29/2014 FL2 transmitted to all facilities within larger geographic area on   Patient informed that his/her managed care company has contracts with or will negotiate with  certain facilities, including the following:     Patient/family informed of bed offers received:  06/29/2014 [facility] [place] Physician recommends and patient chooses bed at    [facility] be [facility] [place] [facility]  06/30/2014 Patient to  be transferred to facility by [facility] Patient and family notified of transfer on 06/30/2014 Name of family member notified:  patient's wife, [name] BraunKaren via phone  The following physician request were entered in Epic:   Additional Comments:   Lincoln MaxinKelly Shacola Schussler, LCSW [facility] Clinical Social Worker cell #: [phone]

## 2014-06-30 NOTE — Progress Notes (Signed)
Inpatient Diabetes Program Recommendations  AACE/ADA: New Consensus Statement on Inpatient Glycemic Control (2013)  Target Ranges:  Prepandial:   less than 140 mg/dL      Peak postprandial:   less than 180 mg/dL (1-2 hours)      Critically ill patients:  140 - 180 mg/dL     Results for [name], [name] A (MRN [id number]) as of 06/30/2014 08:53  Ref. Range 06/29/2014 08:00 06/29/2014 12:15 06/29/2014 16:54 06/29/2014 21:59  Glucose-Capillary Latest Range: 70-99 mg/dL 409293 (H) 811367 (H) 914279 (H) 175 (H)    Results for [name], [name] A (MRN [id number]) as of 06/30/2014 08:53  Ref. Range 06/30/2014 07:47 06/30/2014 08:11  Glucose-Capillary Latest Range: 70-99 mg/dL 41 (LL) 70     Current Orders: Lantus 35 units QHS      Novolog Moderate SSI      Novolog 4 units tidwc   **Hypoglycemic this AM.  Eating 75-100% of meals.  **Note IV steroids switched to PO Prednisone.  Last dose IV Solumedrol given 3am on 12/30.   MD- Please consider decreasing Lantus to 25 units QHS  Patient may likely still need the order for Novolog 4 units tidwc while on Prednisone    Will follow Ambrose FinlandJeannine Johnston Nakya Weyand RN, MSN, CDE Diabetes Coordinator Inpatient Diabetes Program Team Pager: [phone] (8a-10p)

## 2014-06-30 NOTE — Progress Notes (Signed)
Hypoglycemic Event  CBG: 41  Treatment: D50 IV 25 mL  Symptoms: None  Follow-up CBG: [id number]:[id number]:[id number]  CBG Result:70  Possible Reasons for Event: Unknown  Comments/MD notified:    [name] [name], [name] [name]  Remember to initiate Hypoglycemia Order Set & complete

## 2014-06-30 NOTE — Discharge Summary (Signed)
Physician Discharge Summary  OTHAR CURTO ZOX:[id number] DOB: [birth year] DOA: 06/20/2014  PCP: [name], MD  Admit date: 06/20/2014 Discharge date: 06/30/2014  Recommendations for Outpatient Follow-up:  1. Pt will need to follow up with PCP in 2 weeks post discharge 2. Please obtain BMP and CBC in one week 3. Maintain 1 L nasal cannula and wean oxygen to keep oxygen saturation greater than 92%    Discharge Diagnoses:  Acute respiratory failure -Likely multifactorial with the possibility of pulmonary edema, pneumonia (HCAP), and consideration for underlying ILD or BOOP/ Amiodarone pulmonary toxicity -06/25/14 evening-patient developed respiratory distress  -06/25/2014--intravenous Solu-Medrol restarted by pulmonary - at time of admission, The patient did not have any significant clinical improvement with intravenous furosemide x 4 doses or abx -06/26/2014 chest x-ray--worsening diffuse interstitial infiltrates--respiratory distress and transfer to stepdown on BiPAP and restarted IV Lasix -Presently stable on 1 L nasal cannula -06/26/2014--restarted furosemide IV -Patient has diuresed 10 L with furosemide IV -06/28/14 transfer to medical floor -appreciate pulmonary followup-->plan to switch to oral prednisone on 06/30/2014 if medically stable -Patient will be discharged with prednisone 40 mg po daily until he follows up with pulmonary medicine in the outpatient setting  Pulmonary opacities/infiltrates/Amiodarone pulmonary toxicity -No significant clinical improvement with intravenous furosemide 4 doses initially at time of admission -since no clear signs of lung infection will narrow antibiotics to cover only UTI -Blood cultures 2 sets--Unfortunately, this was not initially ordered, and Zosyn was given prior to collection of the blood cultures -check procalcitonin-->1.33-->0.53-->0.18 -Continue bronchodilators -06/26/2014 chest x-ray shows worsening bilateral  interstitial opacities--> redosed intravenous furosemide  Elevated proBNP/Acute diastolic CHF  -Although the patient has CKD stage III which may elevate BNP, this is more elevated than his prior admission  -Although the patient has some JVD on examination, he does not appear to be floridly fluid overloaded  -11/02/13 Echo did not show any RV dysfunction, TR, or pulm HTN  -06/21/2014-- echocardiogram EF 50-55%, increased EDP, mild TR, PAP 41 -I/O's, daily weights -low sodium diet -06/26/2014--> diuresed 10 L -Decrease furosemide to 20 mg IV twice a day--06/28/2014 is last day -The patient will be discharged with furosemide 20 mg po daily start 07/01/14 -BMP should be rechecked in 1 week -Potassium 10 mEq daily   Paroxysmal Atrial fibrillation  -Rate controlled  -back in Afib after initial sinus -Discontinue apixiban given the patient's recent history of multiple falls  -continue aspirin  -discontinue amiodarone, due to concerns for pulmonary toxicity -appreciate cardiology--> metoprolol dose increased to 62.5 mg twice a day, amlodipine increased to 7.5 mg daily -Because the patient's heart rate decreased into the upper 40s, the patient's metoprolol dose was decreased to 50 mg twice a day Pyuria -TNTC WBC in urine  -continue zosyn; will narrow to ceftin for 5 more days to complete tx for UTI -unfortunately, urine culture was not sent from ED -had 8 days antibiotics  -d/c all abx  Acute on CKD stage III  -Baseline creatinine 1.7-2.1  -due to diuresis and presumed UTI -creatinine increase to 2.5 with diuresis -follow renal function trend  Altered mental status -likely due to infection/hypoxemia -resolved now -The patient is now A&Ox3  -CT brain neg for acute findings  COPD  -continue bronchodilators -no wheezing presently -per PCCM will continue steroids for potential ILD  Diabetes mellitus type 2  -05/18/2014 hemoglobin A1c 6.5  -CBGs elevated secondary to  steroids, increase Lantus to 35 units   -as his IV steroids were changed to po prednisone, pt will  d/c with Lantus 25units daily -The patient should have his CBG checks before each meal and at bedtime  -NovoLog sliding scale  -monitor CBGs on oral steroids and adjust accordingly.  Hypertension  -Continue metoprolol tartrate--increase to 62.5 mg twice a day However, the patient experienced bradycardia into the upper 40s. As result, his metoprolol tartrate was decreased back to 50 mg twice a day - -Hold Cardura -will continue holding lasix given recent worsening on renal function -added amlodipine 7.5 mg daily  coronary artery disease  -Patient had CABG in 2006 -Currently chest pain-free -Continue metoprolol tartrate   Thoracic Aortic Aneursym -s/p repair -changes on CT likely due to previous repair -continue outpatient follow up  Constipation -colace and bisacodyl -will use enema if no improvement  Family Communication: wife updated at bedside  Discharge Condition: stable  Consultants Pulmonary medicine Cardiology   Disposition:  Follow-up Information    Follow up with Max FickleMCQUAID, DOUGLAS, MD In 1 week.   Specialty:  Pulmonary Disease   Contact information:   [address] [place] [address] [phone]       Follow up with [name],[name], MD.   Specialty:  Family Medicine   Contact information:   [address] [place] [address] [phone]       Follow up with Runell GessBERRY,JONATHAN J, MD In 2 weeks.   Specialty:  Cardiology   Contact information:   [address] [place] [address] [phone]       Diet:carb modified Wt Readings from Last 3 Encounters:  06/30/14 69.355 kg (152 lb 14.4 oz)  05/21/14 80.9 kg (178 lb 5.6 oz)  04/19/14 79.379 kg (175 lb)    History of present illness:  80 year old male with a history of COPD, paroxysmal A. fib, CKD stage III, diabetes mellitus, hypertension, thoracic aortic aneurysm repair  presents with 3 day history of worsening shortness of breath. Notably, the patient was recently admitted to the hospital from November 18 through 05/21/2014 for pneumonia. The patient was discharged home with levofloxacin. The patient presented to his primary care physician's office on the day of admission when he was noted to have oxygen saturation of 80%. He was sent to the emergency department. In the emergency department, the patient was noted to have oxygen saturation of 63% on room air. CT of the chest showed "mixed density surrounding tubular calcification in the ascending thoracic aorta potentially from chronic dissection, chronic muralthrombus, or even intramural hematoma. Some of this appearance could be from an ascending aortic graft". CT of the abdomen and pelvis was negative for any acute findings, but it does show prominent stool.  The patient was started on intravenous antibiotics, furosemide but did not clinically improve. 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 Amiodarone was discontinued, and the patient's atrial fibrillation has remained rate controlled. Unfortunately, the patient developed respiratory distress during the night on 06/25/2014. Chest x-ray and exam suggested the patient developed fluid overload and he was started on intravenous furosemide with clinical improvement. The patient was ultimately switched to oral furosemide. Although the patient required a switched to intravenous Solu-Medrol temporarily again, he was ultimately switched back to prednisone prior to discharge.   Discharge Exam: Filed Vitals:   06/30/14 0438  BP: 144/53  Pulse: 46  Temp: 97.5 F (36.4 C)  Resp: 20   Filed Vitals:   06/29/14 1556 06/29/14 2158 06/30/14 0438 06/30/14 0755  BP:  136/43 144/53   Pulse:  56 46  Temp:  97.4 F (36.3 C) 97.5 F (36.4 C)   TempSrc:  Oral Oral   Resp:  20 20   Height:      Weight:   69.355 kg (152  lb 14.4 oz)   SpO2: 92% 93% 96% 91%   General: A&O x 3, NAD, pleasant, cooperative Cardiovascular: RRR, no rub, no gallop, no S3 Respiratory: bilateral crackles. No wheeze Abdomen:soft, nontender, nondistended, positive bowel sounds Extremities: No edema, No lymphangitis, no petechiae  Discharge Instructions      Discharge Instructions    Diet - low sodium heart healthy    Complete by:  As directed      Increase activity slowly    Complete by:  As directed             Medication List    STOP taking these medications        amiodarone 200 MG tablet  Commonly known as:  PACERONE     apixaban 2.5 MG Tabs tablet  Commonly known as:  ELIQUIS     doxazosin 2 MG tablet  Commonly known as:  CARDURA     levofloxacin 500 MG tablet  Commonly known as:  LEVAQUIN      TAKE these medications        amLODipine 2.5 MG tablet  Commonly known as:  NORVASC  Take 3 tablets (7.5 mg total) by mouth daily.     aspirin 81 MG tablet  Take 81 mg by mouth daily with breakfast.     calcium carbonate 1250 MG tablet  Commonly known as:  OS-CAL - dosed in mg of elemental calcium  Take 1 tablet by mouth 2 (two) times daily with a meal.     DSS 100 MG Caps  Take 100 mg by mouth 2 (two) times daily.     ezetimibe 10 MG tablet  Commonly known as:  ZETIA  Take 1 tablet (10 mg total) by mouth daily.     ferrous sulfate 325 (65 FE) MG tablet  Take 1 tablet (325 mg total) by mouth daily with breakfast.     FISH OIL PO  Take 1 capsule by mouth 2 (two) times daily.     furosemide 20 MG tablet  Commonly known as:  LASIX  Take 1 tablet (20 mg total) by mouth daily.  Start taking on:  07/01/2014     gabapentin 300 MG capsule  Commonly known as:  NEURONTIN  Take 1 capsule (300 mg total) by mouth 2 (two) times daily.     insulin glargine 100 UNIT/ML injection  Commonly known as:  LANTUS  Inject 20 Units into the skin daily with breakfast.     metoprolol 50 MG tablet  Commonly known as:   LOPRESSOR  Take 50 mg by mouth 2 (two) times daily.     MILK THISTLE PO  Take 1 tablet by mouth daily with breakfast.     multivitamin with minerals Tabs tablet  Take 1 tablet by mouth daily.     oxyCODONE-acetaminophen 5-325 MG per tablet  Commonly known as:  PERCOCET/ROXICET  Take 1-2 tablets by mouth See admin instructions. 2 tabs every morning, 1 tab in the afternoon and 2 tabs at bedtime     potassium chloride 10 MEQ tablet  Commonly known as:  K-DUR,KLOR-CON  Take 1 tablet (10 mEq total) by mouth daily.     predniSONE 20 MG tablet  Commonly known as:  DELTASONE  Take 2 tablets (40 mg total) by mouth  daily with breakfast.     PROBIOTIC DAILY Caps  Take 1 capsule by mouth every evening.         The results of significant diagnostics from this hospitalization (including imaging, microbiology, ancillary and laboratory) are listed below for reference.    Significant Diagnostic Studies: Ct Abdomen Pelvis Wo Contrast  06/20/2014   CLINICAL DATA:  Fall in bathroom. Anti coagulation. Increased confusion.  EXAM: CT CHEST, ABDOMEN AND PELVIS WITHOUT CONTRAST  TECHNIQUE: Multidetector CT imaging of the chest, abdomen and pelvis was performed following the standard protocol without IV contrast.  COMPARISON:  Multiple exams, including 05/19/2014 and 03/26/2005  FINDINGS: CT CHEST FINDINGS  Right upper paratracheal node 1.2 cm in short axis, image 12 series 2. Subcarinal node 1.4 cm in short axis, image 34 series 2. Additional paratracheal nodes are present. If hilar nodes are enlarged there difficult to measure against the background vasculature.  Aortic and branch vessel atherosclerosis. Prior CABG. Ascending thoracic aorta 4.6 cm transverse ; descending thoracic aorta 4.6 cm transverse. Lower descending thoracic aorta 5.0 cm transverse. There is mixed density surrounding tubular calcification in the ascending thoracic aorta potentially from chronic dissection, chronic mural thrombus, or  even intramural hematoma. Some of this appearance could be from an ascending aortic graft-correlate with patient history. Without IV contrast did is difficult to characterize this further.  Trace left pleural effusion. Mild enlargement of the cardiopericardial silhouette  Emphysema is present with scattered scarring and peripheral fibrosis potentially with some early honeycombing at the lung bases. In addition, there is a 2.4 by 1.4 cm nodule anteriorly in the right upper lobe, image 25 of series 5 interstitial accentuation is present bilaterally.  CT ABDOMEN AND PELVIS FINDINGS  Hepatobiliary: Unremarkable  Pancreas: Speckled calcifications throughout the pancreatic parenchyma compatible with chronic calcific pancreatitis.  Spleen: Unremarkable  Adrenals/Urinary Tract: 8 mm left kidney lower pole nonobstructive calculus. Bilateral fluid density renal lesions favoring cysts. No hydronephrosis or hydroureter. No ureteral calculi.  Stomach/Bowel: Prominent stool throughout the colon favors constipation.  Vascular/Lymphatic: Dense aortoiliac atherosclerosis.  Reproductive: Enlarged prostate gland with scattered calcifications, measuring 5.8 by 4.6 cm.  Other: No supplemental non-categorized findings.  Musculoskeletal: Bridging fusion of the sacroiliac joints. posterolateral rod and pedicle screw fixation at L4-5 with interbody and anterior facet fusion. Intervertebral and facet spurring at L3-4 and L5-S1. Small bilateral inguinal hernias contain adipose tissue.  IMPRESSION: 1. 2.4 by 1.4 cm right upper lobe pulmonary nodule with right paratracheal adenopathy, concerning for malignancy. An atypical infectious process could potentially give this appearance. Consider nuclear medicine PET-CT. 2. Severe emphysema with peripheral fibrosis, interstitial accentuation, and some basilar honeycombing. 3. Ascending and descending thoracic aortic aneurysms measuring up to 5 cm transversely. There is a mixed density in the ascending  aortic aneurysm with a complex appearance, possibly from graft material and excluded thrombus, chronic mural thrombus, chronic dissection, or intramural hematoma. Correlate with operative history. If IV contrast could not be administered, MRI might help further assess the thoracic aorta. Otherwise I recommend semi-annual imaging followup by CTA or MRA and referral to cardiothoracic surgery if not already obtained. This recommendation follows 2010 ACCF/AHA/AATS/ACR/ASA/SCA/SCAI/SIR/STS/SVM Guidelines for the Diagnosis and Management of Patients With Thoracic Aortic Disease. Circulation. 2010; 121: T732-K025. 4. Trace left pleural effusion. 5. Chronic calcific pancreatitis. 6.  Prominent stool throughout the colon favors constipation. 7. Enlarged prostate gland. 8. Lumbar spondylosis particularly at L3-4 and L5-S1.   Electronically Signed   By: Herbie Baltimore M.D.   On: 06/20/2014 13:58  Ct Head Wo Contrast  06/20/2014   CLINICAL DATA:  Fall.  Hypoxia.  EXAM: CT HEAD WITHOUT CONTRAST  CT CERVICAL SPINE WITHOUT CONTRAST  TECHNIQUE: Multidetector CT imaging of the head and cervical spine was performed following the standard protocol without intravenous contrast. Multiplanar CT image reconstructions of the cervical spine were also generated.  COMPARISON:  Report from 11/24/2000  FINDINGS: CT HEAD FINDINGS  Hypodense lesion in the right middle cranial fossa tracking up into the sylvian fissure, fluid density, probably an arachnoid cyst or epidermoid. Size 4.0 by 3.2 by approximately 6.0 cm.  Atherosclerosis noted. The brainstem, cerebellum, cerebral peduncle sits, thalami, and basal ganglia appear unremarkable. The ventricular system appears normal and aside from the right middle cranial fossa lesion the basilar cisterns appear normal.  Periventricular white matter and corona radiata hypodensities favor chronic ischemic microvascular white matter disease. No intracranial hemorrhage, mass lesion, or acute CVA. There  is a subcutaneous lesion along the occiput/upper neck in the midline on image 1 of series 3 which could represent subcutaneous bruising. Chronic left frontal sinusitis.  CT CERVICAL SPINE FINDINGS  Considerable degenerative findings at the craniocervical junction and anterior arch of C1-2 with extensive spurring of the basion and pannus formation posterior to the odontoid. Prominent loss of articular space at the anterior C1-2 articulation.  Uncinate and facet spurring cause osseous foraminal stenosis on the right at C2-3 and on the left at C3-4, C4-5, and C6-7. There is interbody fusion at the C5-6 low-level and facet fusion on the left at C4-5 and C7-T1.  There is 2 mm degenerative anterolisthesis at C4-5 and at C7-T1. Degenerative endplate sclerosis noted with posterior osseous ridging at C6-7, C7-T1, and T1-2.  No prevertebral soft tissue swelling. No acute fracture is identified. There is dextroconvex cervical scoliosis.  Prominent emphysema and scarring noted at the lung apices.  IMPRESSION: 1. Right middle cranial fossa arachnoid cyst or epidermoid tracking into the sylvian fissure. MRI can typically differentiate between these 2 entities if clinically warranted. 2. Atherosclerosis. 3. Dense subcutaneous lesion along the occiput but and upper neck could represent some focal bruising. 4. Chronic left frontal sinusitis. 5. Cervical spondylosis causes multilevel osseous foraminal impingement. 6. Considerable spurring at the C1- 2 articulation and along the basion, with pannus formation posterior to the odontoid-rheumatoid arthropathy not excluded. 7. Emphysema.   Electronically Signed   By: Herbie Baltimore M.D.   On: 06/20/2014 13:36   Ct Chest Wo Contrast  06/20/2014   CLINICAL DATA:  Fall in bathroom. Anti coagulation. Increased confusion.  EXAM: CT CHEST, ABDOMEN AND PELVIS WITHOUT CONTRAST  TECHNIQUE: Multidetector CT imaging of the chest, abdomen and pelvis was performed following the standard protocol  without IV contrast.  COMPARISON:  Multiple exams, including 05/19/2014 and 03/26/2005  FINDINGS: CT CHEST FINDINGS  Right upper paratracheal node 1.2 cm in short axis, image 12 series 2. Subcarinal node 1.4 cm in short axis, image 34 series 2. Additional paratracheal nodes are present. If hilar nodes are enlarged there difficult to measure against the background vasculature.  Aortic and branch vessel atherosclerosis. Prior CABG. Ascending thoracic aorta 4.6 cm transverse ; descending thoracic aorta 4.6 cm transverse. Lower descending thoracic aorta 5.0 cm transverse. There is mixed density surrounding tubular calcification in the ascending thoracic aorta potentially from chronic dissection, chronic mural thrombus, or even intramural hematoma. Some of this appearance could be from an ascending aortic graft-correlate with patient history. Without IV contrast did is difficult to characterize this further.  Trace left  pleural effusion. Mild enlargement of the cardiopericardial silhouette  Emphysema is present with scattered scarring and peripheral fibrosis potentially with some early honeycombing at the lung bases. In addition, there is a 2.4 by 1.4 cm nodule anteriorly in the right upper lobe, image 25 of series 5 interstitial accentuation is present bilaterally.  CT ABDOMEN AND PELVIS FINDINGS  Hepatobiliary: Unremarkable  Pancreas: Speckled calcifications throughout the pancreatic parenchyma compatible with chronic calcific pancreatitis.  Spleen: Unremarkable  Adrenals/Urinary Tract: 8 mm left kidney lower pole nonobstructive calculus. Bilateral fluid density renal lesions favoring cysts. No hydronephrosis or hydroureter. No ureteral calculi.  Stomach/Bowel: Prominent stool throughout the colon favors constipation.  Vascular/Lymphatic: Dense aortoiliac atherosclerosis.  Reproductive: Enlarged prostate gland with scattered calcifications, measuring 5.8 by 4.6 cm.  Other: No supplemental non-categorized findings.   Musculoskeletal: Bridging fusion of the sacroiliac joints. posterolateral rod and pedicle screw fixation at L4-5 with interbody and anterior facet fusion. Intervertebral and facet spurring at L3-4 and L5-S1. Small bilateral inguinal hernias contain adipose tissue.  IMPRESSION: 1. 2.4 by 1.4 cm right upper lobe pulmonary nodule with right paratracheal adenopathy, concerning for malignancy. An atypical infectious process could potentially give this appearance. Consider nuclear medicine PET-CT. 2. Severe emphysema with peripheral fibrosis, interstitial accentuation, and some basilar honeycombing. 3. Ascending and descending thoracic aortic aneurysms measuring up to 5 cm transversely. There is a mixed density in the ascending aortic aneurysm with a complex appearance, possibly from graft material and excluded thrombus, chronic mural thrombus, chronic dissection, or intramural hematoma. Correlate with operative history. If IV contrast could not be administered, MRI might help further assess the thoracic aorta. Otherwise I recommend semi-annual imaging followup by CTA or MRA and referral to cardiothoracic surgery if not already obtained. This recommendation follows 2010 ACCF/AHA/AATS/ACR/ASA/SCA/SCAI/SIR/STS/SVM Guidelines for the Diagnosis and Management of Patients With Thoracic Aortic Disease. Circulation. 2010; 121: [id number]. 4. Trace left pleural effusion. 5. Chronic calcific pancreatitis. 6.  Prominent stool throughout the colon favors constipation. 7. Enlarged prostate gland. 8. Lumbar spondylosis particularly at L3-4 and L5-S1.   Electronically Signed   By: Herbie BaltimoreWalt  Liebkemann M.D.   On: 06/20/2014 13:58   Ct Cervical Spine Wo Contrast  06/20/2014   CLINICAL DATA:  Fall.  Hypoxia.  EXAM: CT HEAD WITHOUT CONTRAST  CT CERVICAL SPINE WITHOUT CONTRAST  TECHNIQUE: Multidetector CT imaging of the head and cervical spine was performed following the standard protocol without intravenous contrast. Multiplanar CT image  reconstructions of the cervical spine were also generated.  COMPARISON:  Report from 11/24/2000  FINDINGS: CT HEAD FINDINGS  Hypodense lesion in the right middle cranial fossa tracking up into the sylvian fissure, fluid density, probably an arachnoid cyst or epidermoid. Size 4.0 by 3.2 by approximately 6.0 cm.  Atherosclerosis noted. The brainstem, cerebellum, cerebral peduncle sits, thalami, and basal ganglia appear unremarkable. The ventricular system appears normal and aside from the right middle cranial fossa lesion the basilar cisterns appear normal.  Periventricular white matter and corona radiata hypodensities favor chronic ischemic microvascular white matter disease. No intracranial hemorrhage, mass lesion, or acute CVA. There is a subcutaneous lesion along the occiput/upper neck in the midline on image 1 of series 3 which could represent subcutaneous bruising. Chronic left frontal sinusitis.  CT CERVICAL SPINE FINDINGS  Considerable degenerative findings at the craniocervical junction and anterior arch of C1-2 with extensive spurring of the basion and pannus formation posterior to the odontoid. Prominent loss of articular space at the anterior C1-2 articulation.  Uncinate and facet spurring cause osseous  foraminal stenosis on the right at C2-3 and on the left at C3-4, C4-5, and C6-7. There is interbody fusion at the C5-6 low-level and facet fusion on the left at C4-5 and C7-T1.  There is 2 mm degenerative anterolisthesis at C4-5 and at C7-T1. Degenerative endplate sclerosis noted with posterior osseous ridging at C6-7, C7-T1, and T1-2.  No prevertebral soft tissue swelling. No acute fracture is identified. There is dextroconvex cervical scoliosis.  Prominent emphysema and scarring noted at the lung apices.  IMPRESSION: 1. Right middle cranial fossa arachnoid cyst or epidermoid tracking into the sylvian fissure. MRI can typically differentiate between these 2 entities if clinically warranted. 2.  Atherosclerosis. 3. Dense subcutaneous lesion along the occiput but and upper neck could represent some focal bruising. 4. Chronic left frontal sinusitis. 5. Cervical spondylosis causes multilevel osseous foraminal impingement. 6. Considerable spurring at the C1- 2 articulation and along the basion, with pannus formation posterior to the odontoid-rheumatoid arthropathy not excluded. 7. Emphysema.   Electronically Signed   By: Herbie Baltimore M.D.   On: 06/20/2014 13:36   Dg Chest Port 1 View  06/29/2014   CLINICAL DATA:  Pulmonary infiltrates.  Respiratory failure.  EXAM: PORTABLE CHEST - 1 VIEW  COMPARISON:  06/27/2014.  FINDINGS: Median sternotomy and CABG. The cardiopericardial silhouette remains enlarged. Mediastinal contours appear similar allowing for rotation and projectional differences on today's examination.  The lungs show pulmonary fibrosis with no convincing evidence of superimposed pneumonia. Based on the pulmonary fibrosis, interstitial pulmonary edema cannot be excluded.  Monitoring leads project over the chest.  IMPRESSION: 1. Unchanged pulmonary fibrosis. 2. No definite superimposed acute cardiopulmonary disease. 3. Unchanged cardiomegaly.  CABG.   Electronically Signed   By: Andreas Newport M.D.   On: 06/29/2014 07:46   Dg Chest Port 1 View  06/27/2014   CLINICAL DATA:  Acute respiratory failure.  EXAM: PORTABLE CHEST - 1 VIEW  COMPARISON:  06/25/2014, 06/23/2014, 03/29/2013.  CT 06/20/2014.  FINDINGS: Mediastinum hilar structures normal. Prior CABG. Stable cardiomegaly with normal pulmonary vascularity. Persistent dense bilateral pulmonary interstitial infiltrates. Known right upper lobe pulmonary nodule better characterized by recent CT. No pleural effusion or pneumothorax. No acute osseus abnormality.  IMPRESSION: 1. Persistent dense bilateral pulmonary interstitial infiltrates. 2. Known right upper lobe pulmonary nodule better characterized by CT of 06/20/2014. 3. Prior CABG.  Heart  size is stable.  Pulmonary vascularity normal.   Electronically Signed   By: Maisie Fus  Register   On: 06/27/2014 07:19   Dg Chest Port 1 View  06/25/2014   CLINICAL DATA:  Acute onset of hypoxia and worsening shortness of breath. Initial encounter.  EXAM: PORTABLE CHEST - 1 VIEW  COMPARISON:  Chest radiograph performed 06/23/2014, and CT of the chest performed 06/20/2014  FINDINGS: There is mildly worsened diffuse interstitial prominence, raising suspicion for mild pulmonary edema or atypical pneumonia, superimposed on the patient's chronic emphysema, fibrosis and interstitial lung disease. The known right upper lobe pulmonary nodule is better characterized on recent CT. No definite pleural effusion or pneumothorax is seen.  The cardiomediastinal silhouette remains normal in size. The patient is status post median sternotomy, with evidence of prior CABG. No acute osseous abnormalities are seen.  IMPRESSION: 1. Mildly worsened diffuse interstitial prominence, raising suspicion for mild pulmonary edema or atypical pneumonia, superimposed on the patient's chronic emphysema, fibrosis and interstitial lung disease. 2. Known right upper lobe pulmonary nodule is better characterized on recent CT.   Electronically Signed   By: Beryle Beams.D.  On: 06/25/2014 21:53   Dg Chest Port 1 View  06/23/2014   CLINICAL DATA:  Acute respiratory failure, hypoxia  EXAM: PORTABLE CHEST - 1 VIEW  COMPARISON:  06/20/2014  FINDINGS: Cardiomediastinal silhouette is stable. Again noted reticular diffuse interstitial prominence bilaterally probable due to chronic interstitial lung disease and fibrotic changes. No definite superimposed infiltrate or pulmonary edema. Poorly visualized nodule in right upper lobe again noted measures about 2 cm. This was better visualized on recent CT scan.  IMPRESSION: Again noted diffuse reticular interstitial prominence probable due to chronic interstitial lung disease and fibrotic changes. No  definite superimposed infiltrate. Again noted nodule in right upper lobe medially. This was better visualized on recent CT scan.   Electronically Signed   By: Natasha Mead M.D.   On: 06/23/2014 11:01   Dg Chest Portable 1 View  06/20/2014   CLINICAL DATA:  Fall.  Pneumonia.  Shortness of breath.  Confusion.  EXAM: PORTABLE CHEST - 1 VIEW  COMPARISON:  05/19/2014  FINDINGS: Diffuse interstitial and patchy airspace opacities are present superimposed on emphysema. Atherosclerotic aortic arch and prior CABG. Heart size within normal limits for projection. Stable biapical pleural parenchymal scarring. No pneumothorax observed. No definite blunting of the costophrenic angles.  IMPRESSION: 1. Bilateral interstitial and airspace opacities, worsened, superimposed on severe emphysema. Appearance could reflect edema or atypical pneumonia.   Electronically Signed   By: Herbie Baltimore M.D.   On: 06/20/2014 12:09     Microbiology: Recent Results (from the past 240 hour(s))  Culture, blood (routine x 2)     Status: None   Collection Time: 06/20/14  5:42 PM  Result Value Ref Range Status   Specimen Description BLOOD RIGHT ARM  Final   Special Requests BOTTLES DRAWN AEROBIC AND ANAEROBIC 10CC  Final   Culture  Setup Time   Final    06/21/2014 02:53 Performed at [facility]    Culture   Final    NO GROWTH 5 DAYS Performed at [facility]    Report Status 06/27/2014 FINAL  Final  Culture, blood (routine x 2)     Status: None   Collection Time: 06/20/14  5:50 PM  Result Value Ref Range Status   Specimen Description BLOOD LEFT HAND  Final   Special Requests BOTTLES DRAWN AEROBIC ONLY 5CC  Final   Culture  Setup Time   Final    06/21/2014 02:56 Performed at [facility]    Culture   Final    NO GROWTH 5 DAYS Performed at [facility]    Report Status 06/27/2014 FINAL  Final  MRSA PCR Screening     Status: None   Collection Time: 06/26/14  6:53 AM  Result Value Ref  Range Status   MRSA by PCR NEGATIVE NEGATIVE Final    Comment:        The GeneXpert MRSA Assay (FDA approved for NASAL specimens only), is one component of a comprehensive MRSA colonization surveillance program. It is not intended to diagnose MRSA infection nor to guide or monitor treatment for MRSA infections.   Urine culture     Status: None   Collection Time: 06/26/14  6:23 PM  Result Value Ref Range Status   Specimen Description URINE, CATHETERIZED  Final   Special Requests NONE  Final   Colony Count NO GROWTH Performed at [facility]   Final   Culture NO GROWTH Performed at [facility]   Final   Report Status 06/28/2014  FINAL  Final     Labs: Basic Metabolic Panel:  Recent Labs Lab 06/26/14 0812 06/27/14 0342 06/28/14 0331 06/29/14 0410 06/30/14 0415  NA 139 142 142 142 143  K 4.4 4.1 4.2 3.7 3.6  CL 113* 111 107 105 107  CO2 21 23 26 27 27   GLUCOSE 274* 314* 279* 269* 96  BUN 66* 72* 81* 97* 98*  CREATININE 1.91* 1.97* 2.20* 2.50* 2.48*  CALCIUM 8.9 9.1 9.5 9.1 8.7  MG  --   --   --  2.3 2.4  PHOS  --   --   --  4.2  --    Liver Function Tests: No results for input(s): AST, ALT, ALKPHOS, BILITOT, PROT, ALBUMIN in the last 168 hours. No results for input(s): LIPASE, AMYLASE in the last 168 hours. No results for input(s): AMMONIA in the last 168 hours. CBC:  Recent Labs Lab 06/25/14 0502 06/26/14 0812 06/27/14 0342 06/29/14 0410  WBC 9.9 11.8* 10.1 11.7*  HGB 7.9* 9.0* 9.0* 10.2*  HCT 25.7* 28.8* 29.1* 32.2*  MCV 92.8 91.1 89.8 90.2  PLT 220 257 259 340   Cardiac Enzymes: No results for input(s): CKTOTAL, CKMB, CKMBINDEX, TROPONINI in the last 168 hours. BNP: Invalid input(s): POCBNP CBG:  Recent Labs Lab 06/29/14 1215 06/29/14 1654 06/29/14 2159 06/30/14 0747 06/30/14 0811  GLUCAP 367* 279* 175* 41* 70    Time coordinating discharge:  Greater than 30 minutes  Signed:  Donyae Kohn, DO [facility] Pager: [phone] 06/30/2014, 11:37 AM

## 2014-07-01 ENCOUNTER — Encounter (HOSPITAL_COMMUNITY): Payer: Self-pay | Admitting: Emergency Medicine

## 2014-07-01 ENCOUNTER — Emergency Department (HOSPITAL_COMMUNITY): Payer: Medicare Other

## 2014-07-01 ENCOUNTER — Inpatient Hospital Stay (HOSPITAL_COMMUNITY)
Admission: EM | Admit: 2014-07-01 | Discharge: 2014-07-05 | DRG: 682 | Disposition: A | Discharge status: Skilled Nursing Facility | Payer: Medicare Other | Attending: Internal Medicine | Admitting: Internal Medicine

## 2014-07-01 DIAGNOSIS — D649 Anemia, unspecified: Secondary | ICD-10-CM | POA: Diagnosis present

## 2014-07-01 DIAGNOSIS — N184 Chronic kidney disease, stage 4 (severe): Secondary | ICD-10-CM | POA: Diagnosis present

## 2014-07-01 DIAGNOSIS — E1165 Type 2 diabetes mellitus with hyperglycemia: Secondary | ICD-10-CM | POA: Diagnosis present

## 2014-07-01 DIAGNOSIS — G9340 Encephalopathy, unspecified: Secondary | ICD-10-CM | POA: Diagnosis present

## 2014-07-01 DIAGNOSIS — N179 Acute kidney failure, unspecified: Principal | ICD-10-CM | POA: Diagnosis present

## 2014-07-01 DIAGNOSIS — Z888 Allergy status to other drugs, medicaments and biological substances status: Secondary | ICD-10-CM

## 2014-07-01 DIAGNOSIS — I739 Peripheral vascular disease, unspecified: Secondary | ICD-10-CM | POA: Diagnosis present

## 2014-07-01 DIAGNOSIS — Z7982 Long term (current) use of aspirin: Secondary | ICD-10-CM

## 2014-07-01 DIAGNOSIS — R531 Weakness: Secondary | ICD-10-CM

## 2014-07-01 DIAGNOSIS — N136 Pyonephrosis: Secondary | ICD-10-CM | POA: Diagnosis present

## 2014-07-01 DIAGNOSIS — I252 Old myocardial infarction: Secondary | ICD-10-CM

## 2014-07-01 DIAGNOSIS — N183 Chronic kidney disease, stage 3 unspecified: Secondary | ICD-10-CM | POA: Diagnosis present

## 2014-07-01 DIAGNOSIS — N139 Obstructive and reflux uropathy, unspecified: Secondary | ICD-10-CM | POA: Diagnosis present

## 2014-07-01 DIAGNOSIS — R739 Hyperglycemia, unspecified: Secondary | ICD-10-CM | POA: Diagnosis present

## 2014-07-01 DIAGNOSIS — IMO0002 Reserved for concepts with insufficient information to code with codable children: Secondary | ICD-10-CM | POA: Diagnosis present

## 2014-07-01 DIAGNOSIS — D509 Iron deficiency anemia, unspecified: Secondary | ICD-10-CM | POA: Diagnosis present

## 2014-07-01 DIAGNOSIS — R4182 Altered mental status, unspecified: Secondary | ICD-10-CM

## 2014-07-01 DIAGNOSIS — Z951 Presence of aortocoronary bypass graft: Secondary | ICD-10-CM

## 2014-07-01 DIAGNOSIS — I959 Hypotension, unspecified: Secondary | ICD-10-CM | POA: Diagnosis present

## 2014-07-01 DIAGNOSIS — N319 Neuromuscular dysfunction of bladder, unspecified: Secondary | ICD-10-CM | POA: Diagnosis present

## 2014-07-01 DIAGNOSIS — N189 Chronic kidney disease, unspecified: Secondary | ICD-10-CM

## 2014-07-01 DIAGNOSIS — N1330 Unspecified hydronephrosis: Secondary | ICD-10-CM

## 2014-07-01 DIAGNOSIS — E860 Dehydration: Secondary | ICD-10-CM | POA: Diagnosis present

## 2014-07-01 DIAGNOSIS — I5032 Chronic diastolic (congestive) heart failure: Secondary | ICD-10-CM | POA: Diagnosis present

## 2014-07-01 DIAGNOSIS — I129 Hypertensive chronic kidney disease with stage 1 through stage 4 chronic kidney disease, or unspecified chronic kidney disease: Secondary | ICD-10-CM | POA: Diagnosis present

## 2014-07-01 DIAGNOSIS — E785 Hyperlipidemia, unspecified: Secondary | ICD-10-CM | POA: Diagnosis present

## 2014-07-01 DIAGNOSIS — Z87891 Personal history of nicotine dependence: Secondary | ICD-10-CM

## 2014-07-01 DIAGNOSIS — T83028A Displacement of other indwelling urethral catheter, initial encounter: Secondary | ICD-10-CM | POA: Diagnosis present

## 2014-07-01 DIAGNOSIS — I482 Chronic atrial fibrillation, unspecified: Secondary | ICD-10-CM | POA: Diagnosis present

## 2014-07-01 DIAGNOSIS — I2510 Atherosclerotic heart disease of native coronary artery without angina pectoris: Secondary | ICD-10-CM | POA: Diagnosis present

## 2014-07-01 DIAGNOSIS — N390 Urinary tract infection, site not specified: Secondary | ICD-10-CM | POA: Diagnosis present

## 2014-07-01 DIAGNOSIS — D72829 Elevated white blood cell count, unspecified: Secondary | ICD-10-CM | POA: Diagnosis present

## 2014-07-01 DIAGNOSIS — N320 Bladder-neck obstruction: Secondary | ICD-10-CM | POA: Diagnosis present

## 2014-07-01 DIAGNOSIS — G8220 Paraplegia, unspecified: Secondary | ICD-10-CM | POA: Diagnosis present

## 2014-07-01 DIAGNOSIS — R5383 Other fatigue: Secondary | ICD-10-CM | POA: Diagnosis not present

## 2014-07-01 LAB — CBC WITH DIFFERENTIAL/PLATELET
BASOS ABS: 0 10*3/uL (ref 0.0–0.1)
Basophils Relative: 0 % (ref 0–1)
Eosinophils Absolute: 0 10*3/uL (ref 0.0–0.7)
Eosinophils Relative: 0 % (ref 0–5)
HCT: 36.9 % — ABNORMAL LOW (ref 39.0–52.0)
Hemoglobin: 11.6 g/dL — ABNORMAL LOW (ref 13.0–17.0)
Lymphocytes Relative: 2 % — ABNORMAL LOW (ref 12–46)
Lymphs Abs: 0.6 10*3/uL — ABNORMAL LOW (ref 0.7–4.0)
MCH: 28.9 pg (ref 26.0–34.0)
MCHC: 31.4 g/dL (ref 30.0–36.0)
MCV: 92 fL (ref 78.0–100.0)
Monocytes Absolute: 0.6 10*3/uL (ref 0.1–1.0)
Monocytes Relative: 2 % — ABNORMAL LOW (ref 3–12)
Neutro Abs: 28.6 10*3/uL — ABNORMAL HIGH (ref 1.7–7.7)
Neutrophils Relative %: 96 % — ABNORMAL HIGH (ref 43–77)
Platelets: 299 10*3/uL (ref 150–400)
RBC: 4.01 MIL/uL — AB (ref 4.22–5.81)
RDW: 17.6 % — AB (ref 11.5–15.5)
WBC: 29.8 10*3/uL — ABNORMAL HIGH (ref 4.0–10.5)

## 2014-07-01 LAB — LACTIC ACID, PLASMA: Lactic Acid, Venous: 2.1 mmol/L (ref 0.5–2.2)

## 2014-07-01 LAB — URINALYSIS, ROUTINE W REFLEX MICROSCOPIC
BILIRUBIN URINE: NEGATIVE
GLUCOSE, UA: NEGATIVE mg/dL
Ketones, ur: NEGATIVE mg/dL
Nitrite: NEGATIVE
PROTEIN: 100 mg/dL — AB
Specific Gravity, Urine: 1.013 (ref 1.005–1.030)
Urobilinogen, UA: 0.2 mg/dL (ref 0.0–1.0)
pH: 5.5 (ref 5.0–8.0)

## 2014-07-01 LAB — URINE MICROSCOPIC-ADD ON

## 2014-07-01 LAB — LIPASE, BLOOD: LIPASE: 22 U/L (ref 11–59)

## 2014-07-01 NOTE — ED Provider Notes (Signed)
CSN: [id number]     Arrival date & time 07/01/14  1908 History   First MD Initiated Contact with Patient 07/01/14 2002     Chief Complaint  Patient presents with  . Fatigue     (Consider location/radiation/quality/duration/timing/severity/associated sxs/prior Treatment) Patient is a 81 y.o. male presenting with altered mental status. The history is provided by the patient. No language interpreter was used.  Altered Mental Status Presenting symptoms: confusion, lethargy and partial responsiveness   Severity:  Severe Most recent episode:  Today Episode history:  Single Duration:  1 day Timing:  Constant Progression:  Worsening Chronicity:  New Context: nursing home resident   Associated symptoms: weakness   Associated symptoms: no vomiting     Past Medical History  Diagnosis Date  . Peripheral vascular disease     critical limb ischemia  . Myocardial infarction   . Diabetes mellitus without complication   . Hypertension   . Coronary artery disease   . Atrial fibrillation   . Aortic dissection     w/ Cardiac tamponade  . AVM (arteriovenous malformation)   . Murmur   . Hyperlipidemia   . Neuropathy of left lower extremity   . S/P CABG (coronary artery bypass graft)    Past Surgical History  Procedure Laterality Date  . Coronary artery bypass graft  02/11/2005    LIMA to LAD, SVG to 1st diag, SVG to OM1, and SVG to PD  . Back surgery    . Atherectomy  09/15/2012    Diamondback orbital rotation atherectomy was performed using a 2 mm solid crown up to a max of 120,000 RPM. Angioplasty was performed using a 5x132mm long chocolate balloon for 2 minute inflations. Resulting in reduction of an 80% calcified distal R SFA stenosis to less than 20%  . Lea doppler  10/01/2012    R SFA demonstrated a mild amount of residual plaque suggesting less then 50% diametere reduction. R Calf runoff-one vessel runoff via peroneal artery. The posterior and anterior tibial arteries appeared  occluded.  . Cardiac catheterization  02/04/2005    Severe 3-vessel disease affecting LAD and RCA. Consider CABG  . Cardiovascular stress test  12/16/2006    EKG negative for ischemia. No ECG changes. No significant ischemia demonstrated  . Transthoracic echocardiogram  12/04/2011    EF 50-55%, moderate aortic root dilation  . Atherectomy N/A 09/15/2012    Procedure: ATHERECTOMY;  Surgeon: Runell Gess, MD;  Location: [facility] CATH LAB;  Service: Cardiovascular;  Laterality: N/A;   Family History  Problem Relation Age of Onset  . Hypertension Father    History  Substance Use Topics  . Smoking status: Former Smoker    Quit date: 06/20/1992  . Smokeless tobacco: Never Used  . Alcohol Use: Yes     Comment: 1 glass red wine each night    Review of Systems  Gastrointestinal: Negative for vomiting.  Neurological: Positive for weakness.  Psychiatric/Behavioral: Positive for confusion.  All other systems reviewed and are negative.     Allergies  Cymbalta and Statins  Home Medications   Prior to Admission medications   Medication Sig Start Date End Date Taking? Authorizing Provider  amLODipine (NORVASC) 2.5 MG tablet Take 3 tablets (7.5 mg total) by mouth daily. 06/30/14  Yes Catarina Hartshorn, MD  aspirin 81 MG tablet Take 81 mg by mouth daily with breakfast.    Yes Historical Provider, MD  calcium carbonate (OS-CAL - DOSED IN MG OF ELEMENTAL CALCIUM) 1250 MG tablet Take 1  tablet by mouth 2 (two) times daily with a meal.    Yes Historical Provider, MD  docusate sodium 100 MG CAPS Take 100 mg by mouth 2 (two) times daily. 06/30/14  Yes Catarina Hartshorn, MD  ezetimibe (ZETIA) 10 MG tablet Take 1 tablet (10 mg total) by mouth daily. Patient taking differently: Take 10 mg by mouth daily with breakfast.  12/29/12  Yes Runell Gess, MD  furosemide (LASIX) 20 MG tablet Take 1 tablet (20 mg total) by mouth daily. 07/01/14  Yes Catarina Hartshorn, MD  gabapentin (NEURONTIN) 300 MG capsule Take 1 capsule (300 mg total)  by mouth 2 (two) times daily. 06/30/14  Yes Catarina Hartshorn, MD  insulin aspart (NOVOLOG) 100 UNIT/ML FlexPen Inject 5 Units into the skin 4 (four) times daily.   Yes Historical Provider, MD  insulin glargine (LANTUS) 100 UNIT/ML injection Inject 20 Units into the skin daily with breakfast.    Yes Historical Provider, MD  metoprolol (LOPRESSOR) 50 MG tablet Take 50 mg by mouth 2 (two) times daily. 10/28/13  Yes Lennette Bihari, MD  MILK THISTLE PO Take 1 tablet by mouth daily with breakfast.   Yes Historical Provider, MD  Multiple Vitamin (MULTIVITAMIN WITH MINERALS) TABS tablet Take 1 tablet by mouth daily.   Yes Historical Provider, MD  Omega-3 Fatty Acids (FISH OIL PO) Take 1 capsule by mouth 2 (two) times daily.   Yes Historical Provider, MD  oxyCODONE-acetaminophen (PERCOCET/ROXICET) 5-325 MG per tablet Take 1-2 tablets by mouth See admin instructions. 2 tabs every morning, 1 tab in the afternoon and 2 tabs at bedtime 06/30/14  Yes Catarina Hartshorn, MD  potassium chloride (K-DUR,KLOR-CON) 10 MEQ tablet Take 1 tablet (10 mEq total) by mouth daily. 06/30/14  Yes Catarina Hartshorn, MD  predniSONE (DELTASONE) 10 MG tablet Take 20 mg by mouth daily with breakfast.   Yes Historical Provider, MD  Probiotic Product (PROBIOTIC DAILY) CAPS Take 1 capsule by mouth every evening.   Yes Historical Provider, MD  ferrous sulfate 325 (65 FE) MG tablet Take 1 tablet (325 mg total) by mouth daily with breakfast. 05/21/14   Maryann Mikhail, DO  predniSONE (DELTASONE) 20 MG tablet Take 2 tablets (40 mg total) by mouth daily with breakfast. Patient not taking: Reported on 07/01/2014 06/30/14   Catarina Hartshorn, MD   BP 124/50 mmHg  Pulse 64  Temp(Src) 97.8 F (36.6 C) (Oral)  Resp 20  SpO2 99% Physical Exam  Constitutional: He appears well-developed and well-nourished.  HENT:  Head: Normocephalic and atraumatic.  Right Ear: External ear normal.  Left Ear: External ear normal.  Nose: Nose normal.  Mouth/Throat: Oropharynx is clear and  moist.  Eyes: Conjunctivae and EOM are normal. Pupils are equal, round, and reactive to light.  Neck: Normal range of motion.  Cardiovascular: Normal rate and normal heart sounds.   Pulmonary/Chest: Effort normal and breath sounds normal.  Abdominal: Soft. He exhibits no distension.  Musculoskeletal: Normal range of motion.  Neurological: He is alert.  sleepy  Skin: Skin is warm.  Psychiatric:  sleepy  Nursing note and vitals reviewed.   ED Course  Procedures (including critical care time) Labs Review Labs Reviewed  CBC WITH DIFFERENTIAL - Abnormal; Notable for the following:    WBC 29.8 (*)    RBC 4.01 (*)    Hemoglobin 11.6 (*)    HCT 36.9 (*)    RDW 17.6 (*)    All other components within normal limits  URINALYSIS, ROUTINE W REFLEX MICROSCOPIC -  Abnormal; Notable for the following:    APPearance TURBID (*)    Hgb urine dipstick LARGE (*)    Protein, ur 100 (*)    Leukocytes, UA LARGE (*)    All other components within normal limits  URINE MICROSCOPIC-ADD ON  COMPREHENSIVE METABOLIC PANEL  LIPASE, BLOOD    Imaging Review Dg Chest 2 View  07/01/2014   CLINICAL DATA:  Altered mental status. Weakness. Patient not able to speak.  EXAM: CHEST  2 VIEW  COMPARISON:  06/29/2014  FINDINGS: Postoperative changes in the mediastinum. Low lung volumes with diffuse interstitial changes consistent with fibrosis. No superimposed airspace disease or effusion. No pneumothorax. Calcified and tortuous aorta. Heart size and pulmonary vascularity are normal. No change since previous study.  IMPRESSION: Diffuse pulmonary fibrosis with associated low lung volumes. No superimposed airspace disease.   Electronically Signed   By: Burman Nieves M.D.   On: 07/01/2014 22:17   Ct Head Wo Contrast  07/01/2014   CLINICAL DATA:  Weakness. Lethargy. Sleeping a lot and unable to wake. Confusion.  EXAM: CT HEAD WITHOUT CONTRAST  TECHNIQUE: Contiguous axial images were obtained from the base of the skull  through the vertex without intravenous contrast.  COMPARISON:  06/20/2014  FINDINGS: Diffuse cerebral atrophy. Mild ventricular dilatation consistent with central atrophy. Patchy low-attenuation changes in the deep white matter consistent with small vessel ischemia. Extra-axial fluid collection in the middle cranial fossa consistent with arachnoid cyst. No change since prior study. No mass effect or midline shift. No abnormal extra-axial fluid collections. Gray-white matter junctions are distinct. Basal cisterns are not effaced. No evidence of acute intracranial hemorrhage. No depressed skull fractures. Partial opacification of the left frontal sinuses. Paranasal sinuses are otherwise clear. Mastoid air cells are not opacified. Extensive vascular calcifications.  IMPRESSION: No acute intracranial abnormalities. Extra-axial cystic collection in the right middle cranial fossa. Chronic atrophy and small vessel ischemic changes. No change since previous study.   Electronically Signed   By: Burman Nieves M.D.   On: 07/01/2014 22:20     EKG Interpretation None      Results for orders placed or performed during the hospital encounter of 07/01/14  CBC with Differential  Result Value Ref Range   WBC 29.8 (H) 4.0 - 10.5 K/uL   RBC 4.01 (L) 4.22 - 5.81 MIL/uL   Hemoglobin 11.6 (L) 13.0 - 17.0 g/dL   HCT 40.9 (L) 81.1 - 91.4 %   MCV 92.0 78.0 - 100.0 fL   MCH 28.9 26.0 - 34.0 pg   MCHC 31.4 30.0 - 36.0 g/dL   RDW 78.2 (H) 95.6 - 21.3 %   Platelets 299 150 - 400 K/uL   Neutrophils Relative % 96 (H) 43 - 77 %   Lymphocytes Relative 2 (L) 12 - 46 %   Monocytes Relative 2 (L) 3 - 12 %   Eosinophils Relative 0 0 - 5 %   Basophils Relative 0 0 - 1 %   Neutro Abs 28.6 (H) 1.7 - 7.7 K/uL   Lymphs Abs 0.6 (L) 0.7 - 4.0 K/uL   Monocytes Absolute 0.6 0.1 - 1.0 K/uL   Eosinophils Absolute 0.0 0.0 - 0.7 K/uL   Basophils Absolute 0.0 0.0 - 0.1 K/uL   Smear Review LARGE PLATELETS PRESENT   Urinalysis, Routine  w reflex microscopic  Result Value Ref Range   Color, Urine YELLOW YELLOW   APPearance TURBID (A) CLEAR   Specific Gravity, Urine 1.013 1.005 - 1.030   pH 5.5  5.0 - 8.0   Glucose, UA NEGATIVE NEGATIVE mg/dL   Hgb urine dipstick LARGE (A) NEGATIVE   Bilirubin Urine NEGATIVE NEGATIVE   Ketones, ur NEGATIVE NEGATIVE mg/dL   Protein, ur 161 (A) NEGATIVE mg/dL   Urobilinogen, UA 0.2 0.0 - 1.0 mg/dL   Nitrite NEGATIVE NEGATIVE   Leukocytes, UA LARGE (A) NEGATIVE  Urine microscopic-add on  Result Value Ref Range   WBC, UA TOO NUMEROUS TO COUNT <3 WBC/hpf   RBC / HPF 7-10 <3 RBC/hpf   Urine-Other FEW YEAST   Comprehensive metabolic panel  Result Value Ref Range   Sodium 142 135 - 145 mmol/L   Potassium 4.8 3.5 - 5.1 mmol/L   Chloride 111 96 - 112 mEq/L   CO2 23 19 - 32 mmol/L   Glucose, Bld 243 (H) 70 - 99 mg/dL   BUN PENDING 6 - 23 mg/dL   Creatinine, Ser 0.96 (H) 0.50 - 1.35 mg/dL   Calcium 8.6 8.4 - 04.5 mg/dL   Total Protein 7.0 6.0 - 8.3 g/dL   Albumin 3.1 (L) 3.5 - 5.2 g/dL   AST 13 0 - 37 U/L   ALT 33 0 - 53 U/L   Alkaline Phosphatase 69 39 - 117 U/L   Total Bilirubin 1.0 0.3 - 1.2 mg/dL   GFR calc non Af Amer 17 (L) >90 mL/min   GFR calc Af Amer 20 (L) >90 mL/min   Anion gap 8 5 - 15  Lipase, blood  Result Value Ref Range   Lipase 22 11 - 59 U/L  Lactic acid, plasma  Result Value Ref Range   Lactic Acid, Venous 2.1 0.5 - 2.2 mmol/L   Ct Abdomen Pelvis Wo Contrast  06/20/2014   CLINICAL DATA:  Fall in bathroom. Anti coagulation. Increased confusion.  EXAM: CT CHEST, ABDOMEN AND PELVIS WITHOUT CONTRAST  TECHNIQUE: Multidetector CT imaging of the chest, abdomen and pelvis was performed following the standard protocol without IV contrast.  COMPARISON:  Multiple exams, including 05/19/2014 and 03/26/2005  FINDINGS: CT CHEST FINDINGS  Right upper paratracheal node 1.2 cm in short axis, image 12 series 2. Subcarinal node 1.4 cm in short axis, image 34 series 2. Additional  paratracheal nodes are present. If hilar nodes are enlarged there difficult to measure against the background vasculature.  Aortic and branch vessel atherosclerosis. Prior CABG. Ascending thoracic aorta 4.6 cm transverse ; descending thoracic aorta 4.6 cm transverse. Lower descending thoracic aorta 5.0 cm transverse. There is mixed density surrounding tubular calcification in the ascending thoracic aorta potentially from chronic dissection, chronic mural thrombus, or even intramural hematoma. Some of this appearance could be from an ascending aortic graft-correlate with patient history. Without IV contrast did is difficult to characterize this further.  Trace left pleural effusion. Mild enlargement of the cardiopericardial silhouette  Emphysema is present with scattered scarring and peripheral fibrosis potentially with some early honeycombing at the lung bases. In addition, there is a 2.4 by 1.4 cm nodule anteriorly in the right upper lobe, image 25 of series 5 interstitial accentuation is present bilaterally.  CT ABDOMEN AND PELVIS FINDINGS  Hepatobiliary: Unremarkable  Pancreas: Speckled calcifications throughout the pancreatic parenchyma compatible with chronic calcific pancreatitis.  Spleen: Unremarkable  Adrenals/Urinary Tract: 8 mm left kidney lower pole nonobstructive calculus. Bilateral fluid density renal lesions favoring cysts. No hydronephrosis or hydroureter. No ureteral calculi.  Stomach/Bowel: Prominent stool throughout the colon favors constipation.  Vascular/Lymphatic: Dense aortoiliac atherosclerosis.  Reproductive: Enlarged prostate gland with scattered calcifications, measuring 5.8 by  4.6 cm.  Other: No supplemental non-categorized findings.  Musculoskeletal: Bridging fusion of the sacroiliac joints. posterolateral rod and pedicle screw fixation at L4-5 with interbody and anterior facet fusion. Intervertebral and facet spurring at L3-4 and L5-S1. Small bilateral inguinal hernias contain adipose  tissue.  IMPRESSION: 1. 2.4 by 1.4 cm right upper lobe pulmonary nodule with right paratracheal adenopathy, concerning for malignancy. An atypical infectious process could potentially give this appearance. Consider nuclear medicine PET-CT. 2. Severe emphysema with peripheral fibrosis, interstitial accentuation, and some basilar honeycombing. 3. Ascending and descending thoracic aortic aneurysms measuring up to 5 cm transversely. There is a mixed density in the ascending aortic aneurysm with a complex appearance, possibly from graft material and excluded thrombus, chronic mural thrombus, chronic dissection, or intramural hematoma. Correlate with operative history. If IV contrast could not be administered, MRI might help further assess the thoracic aorta. Otherwise I recommend semi-annual imaging followup by CTA or MRA and referral to cardiothoracic surgery if not already obtained. This recommendation follows 2010 ACCF/AHA/AATS/ACR/ASA/SCA/SCAI/SIR/STS/SVM Guidelines for the Diagnosis and Management of Patients With Thoracic Aortic Disease. Circulation. 2010; 121: Z610-R604. 4. Trace left pleural effusion. 5. Chronic calcific pancreatitis. 6.  Prominent stool throughout the colon favors constipation. 7. Enlarged prostate gland. 8. Lumbar spondylosis particularly at L3-4 and L5-S1.   Electronically Signed   By: Herbie Baltimore M.D.   On: 06/20/2014 13:58   Dg Chest 2 View  07/01/2014   CLINICAL DATA:  Altered mental status. Weakness. Patient not able to speak.  EXAM: CHEST  2 VIEW  COMPARISON:  06/29/2014  FINDINGS: Postoperative changes in the mediastinum. Low lung volumes with diffuse interstitial changes consistent with fibrosis. No superimposed airspace disease or effusion. No pneumothorax. Calcified and tortuous aorta. Heart size and pulmonary vascularity are normal. No change since previous study.  IMPRESSION: Diffuse pulmonary fibrosis with associated low lung volumes. No superimposed airspace disease.    Electronically Signed   By: Burman Nieves M.D.   On: 07/01/2014 22:17   Ct Head Wo Contrast  07/01/2014   CLINICAL DATA:  Weakness. Lethargy. Sleeping a lot and unable to wake. Confusion.  EXAM: CT HEAD WITHOUT CONTRAST  TECHNIQUE: Contiguous axial images were obtained from the base of the skull through the vertex without intravenous contrast.  COMPARISON:  06/20/2014  FINDINGS: Diffuse cerebral atrophy. Mild ventricular dilatation consistent with central atrophy. Patchy low-attenuation changes in the deep white matter consistent with small vessel ischemia. Extra-axial fluid collection in the middle cranial fossa consistent with arachnoid cyst. No change since prior study. No mass effect or midline shift. No abnormal extra-axial fluid collections. Gray-white matter junctions are distinct. Basal cisterns are not effaced. No evidence of acute intracranial hemorrhage. No depressed skull fractures. Partial opacification of the left frontal sinuses. Paranasal sinuses are otherwise clear. Mastoid air cells are not opacified. Extensive vascular calcifications.  IMPRESSION: No acute intracranial abnormalities. Extra-axial cystic collection in the right middle cranial fossa. Chronic atrophy and small vessel ischemic changes. No change since previous study.   Electronically Signed   By: Burman Nieves M.D.   On: 07/01/2014 22:20   Ct Head Wo Contrast  06/20/2014   CLINICAL DATA:  Fall.  Hypoxia.  EXAM: CT HEAD WITHOUT CONTRAST  CT CERVICAL SPINE WITHOUT CONTRAST  TECHNIQUE: Multidetector CT imaging of the head and cervical spine was performed following the standard protocol without intravenous contrast. Multiplanar CT image reconstructions of the cervical spine were also generated.  COMPARISON:  Report from 11/24/2000  FINDINGS: CT HEAD FINDINGS  Hypodense lesion in the right middle cranial fossa tracking up into the sylvian fissure, fluid density, probably an arachnoid cyst or epidermoid. Size 4.0 by 3.2 by  approximately 6.0 cm.  Atherosclerosis noted. The brainstem, cerebellum, cerebral peduncle sits, thalami, and basal ganglia appear unremarkable. The ventricular system appears normal and aside from the right middle cranial fossa lesion the basilar cisterns appear normal.  Periventricular white matter and corona radiata hypodensities favor chronic ischemic microvascular white matter disease. No intracranial hemorrhage, mass lesion, or acute CVA. There is a subcutaneous lesion along the occiput/upper neck in the midline on image 1 of series 3 which could represent subcutaneous bruising. Chronic left frontal sinusitis.  CT CERVICAL SPINE FINDINGS  Considerable degenerative findings at the craniocervical junction and anterior arch of C1-2 with extensive spurring of the basion and pannus formation posterior to the odontoid. Prominent loss of articular space at the anterior C1-2 articulation.  Uncinate and facet spurring cause osseous foraminal stenosis on the right at C2-3 and on the left at C3-4, C4-5, and C6-7. There is interbody fusion at the C5-6 low-level and facet fusion on the left at C4-5 and C7-T1.  There is 2 mm degenerative anterolisthesis at C4-5 and at C7-T1. Degenerative endplate sclerosis noted with posterior osseous ridging at C6-7, C7-T1, and T1-2.  No prevertebral soft tissue swelling. No acute fracture is identified. There is dextroconvex cervical scoliosis.  Prominent emphysema and scarring noted at the lung apices.  IMPRESSION: 1. Right middle cranial fossa arachnoid cyst or epidermoid tracking into the sylvian fissure. MRI can typically differentiate between these 2 entities if clinically warranted. 2. Atherosclerosis. 3. Dense subcutaneous lesion along the occiput but and upper neck could represent some focal bruising. 4. Chronic left frontal sinusitis. 5. Cervical spondylosis causes multilevel osseous foraminal impingement. 6. Considerable spurring at the C1- 2 articulation and along the basion,  with pannus formation posterior to the odontoid-rheumatoid arthropathy not excluded. 7. Emphysema.   Electronically Signed   By: Herbie Baltimore M.D.   On: 06/20/2014 13:36   Ct Chest Wo Contrast  06/20/2014   CLINICAL DATA:  Fall in bathroom. Anti coagulation. Increased confusion.  EXAM: CT CHEST, ABDOMEN AND PELVIS WITHOUT CONTRAST  TECHNIQUE: Multidetector CT imaging of the chest, abdomen and pelvis was performed following the standard protocol without IV contrast.  COMPARISON:  Multiple exams, including 05/19/2014 and 03/26/2005  FINDINGS: CT CHEST FINDINGS  Right upper paratracheal node 1.2 cm in short axis, image 12 series 2. Subcarinal node 1.4 cm in short axis, image 34 series 2. Additional paratracheal nodes are present. If hilar nodes are enlarged there difficult to measure against the background vasculature.  Aortic and branch vessel atherosclerosis. Prior CABG. Ascending thoracic aorta 4.6 cm transverse ; descending thoracic aorta 4.6 cm transverse. Lower descending thoracic aorta 5.0 cm transverse. There is mixed density surrounding tubular calcification in the ascending thoracic aorta potentially from chronic dissection, chronic mural thrombus, or even intramural hematoma. Some of this appearance could be from an ascending aortic graft-correlate with patient history. Without IV contrast did is difficult to characterize this further.  Trace left pleural effusion. Mild enlargement of the cardiopericardial silhouette  Emphysema is present with scattered scarring and peripheral fibrosis potentially with some early honeycombing at the lung bases. In addition, there is a 2.4 by 1.4 cm nodule anteriorly in the right upper lobe, image 25 of series 5 interstitial accentuation is present bilaterally.  CT ABDOMEN AND PELVIS FINDINGS  Hepatobiliary: Unremarkable  Pancreas: Speckled calcifications throughout the pancreatic  parenchyma compatible with chronic calcific pancreatitis.  Spleen: Unremarkable   Adrenals/Urinary Tract: 8 mm left kidney lower pole nonobstructive calculus. Bilateral fluid density renal lesions favoring cysts. No hydronephrosis or hydroureter. No ureteral calculi.  Stomach/Bowel: Prominent stool throughout the colon favors constipation.  Vascular/Lymphatic: Dense aortoiliac atherosclerosis.  Reproductive: Enlarged prostate gland with scattered calcifications, measuring 5.8 by 4.6 cm.  Other: No supplemental non-categorized findings.  Musculoskeletal: Bridging fusion of the sacroiliac joints. posterolateral rod and pedicle screw fixation at L4-5 with interbody and anterior facet fusion. Intervertebral and facet spurring at L3-4 and L5-S1. Small bilateral inguinal hernias contain adipose tissue.  IMPRESSION: 1. 2.4 by 1.4 cm right upper lobe pulmonary nodule with right paratracheal adenopathy, concerning for malignancy. An atypical infectious process could potentially give this appearance. Consider nuclear medicine PET-CT. 2. Severe emphysema with peripheral fibrosis, interstitial accentuation, and some basilar honeycombing. 3. Ascending and descending thoracic aortic aneurysms measuring up to 5 cm transversely. There is a mixed density in the ascending aortic aneurysm with a complex appearance, possibly from graft material and excluded thrombus, chronic mural thrombus, chronic dissection, or intramural hematoma. Correlate with operative history. If IV contrast could not be administered, MRI might help further assess the thoracic aorta. Otherwise I recommend semi-annual imaging followup by CTA or MRA and referral to cardiothoracic surgery if not already obtained. This recommendation follows 2010 ACCF/AHA/AATS/ACR/ASA/SCA/SCAI/SIR/STS/SVM Guidelines for the Diagnosis and Management of Patients With Thoracic Aortic Disease. Circulation. 2010; 121: Z610-R604. 4. Trace left pleural effusion. 5. Chronic calcific pancreatitis. 6.  Prominent stool throughout the colon favors constipation. 7. Enlarged  prostate gland. 8. Lumbar spondylosis particularly at L3-4 and L5-S1.   Electronically Signed   By: Herbie Baltimore M.D.   On: 06/20/2014 13:58   Ct Cervical Spine Wo Contrast  06/20/2014   CLINICAL DATA:  Fall.  Hypoxia.  EXAM: CT HEAD WITHOUT CONTRAST  CT CERVICAL SPINE WITHOUT CONTRAST  TECHNIQUE: Multidetector CT imaging of the head and cervical spine was performed following the standard protocol without intravenous contrast. Multiplanar CT image reconstructions of the cervical spine were also generated.  COMPARISON:  Report from 11/24/2000  FINDINGS: CT HEAD FINDINGS  Hypodense lesion in the right middle cranial fossa tracking up into the sylvian fissure, fluid density, probably an arachnoid cyst or epidermoid. Size 4.0 by 3.2 by approximately 6.0 cm.  Atherosclerosis noted. The brainstem, cerebellum, cerebral peduncle sits, thalami, and basal ganglia appear unremarkable. The ventricular system appears normal and aside from the right middle cranial fossa lesion the basilar cisterns appear normal.  Periventricular white matter and corona radiata hypodensities favor chronic ischemic microvascular white matter disease. No intracranial hemorrhage, mass lesion, or acute CVA. There is a subcutaneous lesion along the occiput/upper neck in the midline on image 1 of series 3 which could represent subcutaneous bruising. Chronic left frontal sinusitis.  CT CERVICAL SPINE FINDINGS  Considerable degenerative findings at the craniocervical junction and anterior arch of C1-2 with extensive spurring of the basion and pannus formation posterior to the odontoid. Prominent loss of articular space at the anterior C1-2 articulation.  Uncinate and facet spurring cause osseous foraminal stenosis on the right at C2-3 and on the left at C3-4, C4-5, and C6-7. There is interbody fusion at the C5-6 low-level and facet fusion on the left at C4-5 and C7-T1.  There is 2 mm degenerative anterolisthesis at C4-5 and at C7-T1. Degenerative  endplate sclerosis noted with posterior osseous ridging at C6-7, C7-T1, and T1-2.  No prevertebral soft tissue swelling. No acute fracture  is identified. There is dextroconvex cervical scoliosis.  Prominent emphysema and scarring noted at the lung apices.  IMPRESSION: 1. Right middle cranial fossa arachnoid cyst or epidermoid tracking into the sylvian fissure. MRI can typically differentiate between these 2 entities if clinically warranted. 2. Atherosclerosis. 3. Dense subcutaneous lesion along the occiput but and upper neck could represent some focal bruising. 4. Chronic left frontal sinusitis. 5. Cervical spondylosis causes multilevel osseous foraminal impingement. 6. Considerable spurring at the C1- 2 articulation and along the basion, with pannus formation posterior to the odontoid-rheumatoid arthropathy not excluded. 7. Emphysema.   Electronically Signed   By: Herbie Baltimore M.D.   On: 06/20/2014 13:36   Dg Chest Port 1 View  06/29/2014   CLINICAL DATA:  Pulmonary infiltrates.  Respiratory failure.  EXAM: PORTABLE CHEST - 1 VIEW  COMPARISON:  06/27/2014.  FINDINGS: Median sternotomy and CABG. The cardiopericardial silhouette remains enlarged. Mediastinal contours appear similar allowing for rotation and projectional differences on today's examination.  The lungs show pulmonary fibrosis with no convincing evidence of superimposed pneumonia. Based on the pulmonary fibrosis, interstitial pulmonary edema cannot be excluded.  Monitoring leads project over the chest.  IMPRESSION: 1. Unchanged pulmonary fibrosis. 2. No definite superimposed acute cardiopulmonary disease. 3. Unchanged cardiomegaly.  CABG.   Electronically Signed   By: Andreas Newport M.D.   On: 06/29/2014 07:46   Dg Chest Port 1 View  06/27/2014   CLINICAL DATA:  Acute respiratory failure.  EXAM: PORTABLE CHEST - 1 VIEW  COMPARISON:  06/25/2014, 06/23/2014, 03/29/2013.  CT 06/20/2014.  FINDINGS: Mediastinum hilar structures normal. Prior  CABG. Stable cardiomegaly with normal pulmonary vascularity. Persistent dense bilateral pulmonary interstitial infiltrates. Known right upper lobe pulmonary nodule better characterized by recent CT. No pleural effusion or pneumothorax. No acute osseus abnormality.  IMPRESSION: 1. Persistent dense bilateral pulmonary interstitial infiltrates. 2. Known right upper lobe pulmonary nodule better characterized by CT of 06/20/2014. 3. Prior CABG.  Heart size is stable.  Pulmonary vascularity normal.   Electronically Signed   By: Maisie Fus  Register   On: 06/27/2014 07:19   Dg Chest Port 1 View  06/25/2014   CLINICAL DATA:  Acute onset of hypoxia and worsening shortness of breath. Initial encounter.  EXAM: PORTABLE CHEST - 1 VIEW  COMPARISON:  Chest radiograph performed 06/23/2014, and CT of the chest performed 06/20/2014  FINDINGS: There is mildly worsened diffuse interstitial prominence, raising suspicion for mild pulmonary edema or atypical pneumonia, superimposed on the patient's chronic emphysema, fibrosis and interstitial lung disease. The known right upper lobe pulmonary nodule is better characterized on recent CT. No definite pleural effusion or pneumothorax is seen.  The cardiomediastinal silhouette remains normal in size. The patient is status post median sternotomy, with evidence of prior CABG. No acute osseous abnormalities are seen.  IMPRESSION: 1. Mildly worsened diffuse interstitial prominence, raising suspicion for mild pulmonary edema or atypical pneumonia, superimposed on the patient's chronic emphysema, fibrosis and interstitial lung disease. 2. Known right upper lobe pulmonary nodule is better characterized on recent CT.   Electronically Signed   By: Roanna Raider M.D.   On: 06/25/2014 21:53   Dg Chest Port 1 View  06/23/2014   CLINICAL DATA:  Acute respiratory failure, hypoxia  EXAM: PORTABLE CHEST - 1 VIEW  COMPARISON:  06/20/2014  FINDINGS: Cardiomediastinal silhouette is stable. Again noted  reticular diffuse interstitial prominence bilaterally probable due to chronic interstitial lung disease and fibrotic changes. No definite superimposed infiltrate or pulmonary edema. Poorly visualized nodule in  right upper lobe again noted measures about 2 cm. This was better visualized on recent CT scan.  IMPRESSION: Again noted diffuse reticular interstitial prominence probable due to chronic interstitial lung disease and fibrotic changes. No definite superimposed infiltrate. Again noted nodule in right upper lobe medially. This was better visualized on recent CT scan.   Electronically Signed   By: Natasha Mead M.D.   On: 06/23/2014 11:01   Dg Chest Portable 1 View  06/20/2014   CLINICAL DATA:  Fall.  Pneumonia.  Shortness of breath.  Confusion.  EXAM: PORTABLE CHEST - 1 VIEW  COMPARISON:  05/19/2014  FINDINGS: Diffuse interstitial and patchy airspace opacities are present superimposed on emphysema. Atherosclerotic aortic arch and prior CABG. Heart size within normal limits for projection. Stable biapical pleural parenchymal scarring. No pneumothorax observed. No definite blunting of the costophrenic angles.  IMPRESSION: 1. Bilateral interstitial and airspace opacities, worsened, superimposed on severe emphysema. Appearance could reflect edema or atypical pneumonia.   Electronically Signed   By: Herbie Baltimore M.D.   On: 06/20/2014 12:09     MDM   Final diagnoses:  Weakness  Leukocytosis  Altered mental status, unspecified altered mental status type    I spoke to Dr. Gentry Fitz who will admit.  ABG and amonia level ordered .   Cefepime ordered per advice of Dr. Gentry Fitz.      Lonia Skinner [place], PA-C 07/02/14 0113  Arby Barrette, MD 07/03/14 [phone]

## 2014-07-01 NOTE — ED Notes (Signed)
Per EMS-pt here with c/o of lethargy, sleeping a lot unable to wake, wife states that he's been sleep for a day. Confused. No pain. Released from hospital yesterday. CBG 285. Temp 98. Always on 3L O2

## 2014-07-01 NOTE — ED Notes (Signed)
Patient transported to X-ray 

## 2014-07-01 NOTE — ED Notes (Signed)
Bed: WA10 Expected date:  Expected time:  Means of arrival:  Comments: EMS 

## 2014-07-02 ENCOUNTER — Encounter (HOSPITAL_COMMUNITY): Payer: Self-pay | Admitting: Internal Medicine

## 2014-07-02 ENCOUNTER — Inpatient Hospital Stay (HOSPITAL_COMMUNITY): Payer: Medicare Other

## 2014-07-02 DIAGNOSIS — I5032 Chronic diastolic (congestive) heart failure: Secondary | ICD-10-CM | POA: Diagnosis present

## 2014-07-02 DIAGNOSIS — T83028A Displacement of other indwelling urethral catheter, initial encounter: Secondary | ICD-10-CM | POA: Diagnosis present

## 2014-07-02 DIAGNOSIS — Z951 Presence of aortocoronary bypass graft: Secondary | ICD-10-CM | POA: Diagnosis not present

## 2014-07-02 DIAGNOSIS — IMO0002 Reserved for concepts with insufficient information to code with codable children: Secondary | ICD-10-CM | POA: Diagnosis present

## 2014-07-02 DIAGNOSIS — D72829 Elevated white blood cell count, unspecified: Secondary | ICD-10-CM | POA: Diagnosis present

## 2014-07-02 DIAGNOSIS — E860 Dehydration: Secondary | ICD-10-CM | POA: Diagnosis present

## 2014-07-02 DIAGNOSIS — I739 Peripheral vascular disease, unspecified: Secondary | ICD-10-CM | POA: Diagnosis present

## 2014-07-02 DIAGNOSIS — D649 Anemia, unspecified: Secondary | ICD-10-CM | POA: Diagnosis present

## 2014-07-02 DIAGNOSIS — G8220 Paraplegia, unspecified: Secondary | ICD-10-CM | POA: Diagnosis present

## 2014-07-02 DIAGNOSIS — Z888 Allergy status to other drugs, medicaments and biological substances status: Secondary | ICD-10-CM | POA: Diagnosis not present

## 2014-07-02 DIAGNOSIS — N179 Acute kidney failure, unspecified: Secondary | ICD-10-CM | POA: Diagnosis present

## 2014-07-02 DIAGNOSIS — N136 Pyonephrosis: Secondary | ICD-10-CM | POA: Diagnosis present

## 2014-07-02 DIAGNOSIS — Z7982 Long term (current) use of aspirin: Secondary | ICD-10-CM | POA: Diagnosis not present

## 2014-07-02 DIAGNOSIS — I252 Old myocardial infarction: Secondary | ICD-10-CM | POA: Diagnosis not present

## 2014-07-02 DIAGNOSIS — N189 Chronic kidney disease, unspecified: Secondary | ICD-10-CM | POA: Diagnosis present

## 2014-07-02 DIAGNOSIS — I482 Chronic atrial fibrillation, unspecified: Secondary | ICD-10-CM | POA: Diagnosis present

## 2014-07-02 DIAGNOSIS — R5383 Other fatigue: Secondary | ICD-10-CM | POA: Diagnosis present

## 2014-07-02 DIAGNOSIS — N184 Chronic kidney disease, stage 4 (severe): Secondary | ICD-10-CM | POA: Diagnosis present

## 2014-07-02 DIAGNOSIS — D509 Iron deficiency anemia, unspecified: Secondary | ICD-10-CM | POA: Diagnosis present

## 2014-07-02 DIAGNOSIS — Z87891 Personal history of nicotine dependence: Secondary | ICD-10-CM | POA: Diagnosis not present

## 2014-07-02 DIAGNOSIS — E785 Hyperlipidemia, unspecified: Secondary | ICD-10-CM | POA: Diagnosis present

## 2014-07-02 DIAGNOSIS — N319 Neuromuscular dysfunction of bladder, unspecified: Secondary | ICD-10-CM | POA: Diagnosis present

## 2014-07-02 DIAGNOSIS — I2510 Atherosclerotic heart disease of native coronary artery without angina pectoris: Secondary | ICD-10-CM | POA: Diagnosis present

## 2014-07-02 DIAGNOSIS — G9340 Encephalopathy, unspecified: Secondary | ICD-10-CM | POA: Diagnosis present

## 2014-07-02 DIAGNOSIS — I129 Hypertensive chronic kidney disease with stage 1 through stage 4 chronic kidney disease, or unspecified chronic kidney disease: Secondary | ICD-10-CM | POA: Diagnosis present

## 2014-07-02 DIAGNOSIS — N320 Bladder-neck obstruction: Secondary | ICD-10-CM | POA: Diagnosis present

## 2014-07-02 DIAGNOSIS — E1165 Type 2 diabetes mellitus with hyperglycemia: Secondary | ICD-10-CM

## 2014-07-02 LAB — COMPREHENSIVE METABOLIC PANEL
ALBUMIN: 2.8 g/dL — AB (ref 3.5–5.2)
ALK PHOS: 66 U/L (ref 39–117)
ALT: 33 U/L (ref 0–53)
ALT: 31 U/L (ref 0–53)
AST: 13 U/L (ref 0–37)
AST: 13 U/L (ref 0–37)
Albumin: 3.1 g/dL — ABNORMAL LOW (ref 3.5–5.2)
Alkaline Phosphatase: 69 U/L (ref 39–117)
Anion gap: 8 (ref 5–15)
Anion gap: 8 (ref 5–15)
BILIRUBIN TOTAL: 1 mg/dL (ref 0.3–1.2)
BILIRUBIN TOTAL: 0.9 mg/dL (ref 0.3–1.2)
BUN: 122 mg/dL — ABNORMAL HIGH (ref 6–23)
BUN: 129 mg/dL — AB (ref 6–23)
CHLORIDE: 111 meq/L (ref 96–112)
CO2: 23 mmol/L (ref 19–32)
CO2: 23 mmol/L (ref 19–32)
CREATININE: 3.39 mg/dL — AB (ref 0.50–1.35)
Calcium: 8.6 mg/dL (ref 8.4–10.5)
Calcium: 8.3 mg/dL — ABNORMAL LOW (ref 8.4–10.5)
Chloride: 107 mEq/L (ref 96–112)
Creatinine, Ser: 3.11 mg/dL — ABNORMAL HIGH (ref 0.50–1.35)
GFR calc Af Amer: 20 mL/min — ABNORMAL LOW (ref >90)
GFR calc non Af Amer: 15 mL/min — ABNORMAL LOW (ref >90)
GFR, EST AFRICAN AMERICAN: 18 mL/min — AB (ref >90)
GFR, EST NON AFRICAN AMERICAN: 17 mL/min — AB (ref >90)
GLUCOSE: 243 mg/dL — AB (ref 70–99)
Glucose, Bld: 286 mg/dL — ABNORMAL HIGH (ref 70–99)
POTASSIUM: 4.8 mmol/L (ref 3.5–5.1)
Potassium: 4.6 mmol/L (ref 3.5–5.1)
SODIUM: 142 mmol/L (ref 135–145)
Sodium: 138 mmol/L (ref 135–145)
Total Protein: 7 g/dL (ref 6.0–8.3)
Total Protein: 6.6 g/dL (ref 6.0–8.3)

## 2014-07-02 LAB — BLOOD GAS, ARTERIAL
ACID-BASE DEFICIT: 1.6 mmol/L (ref 0.0–2.0)
BICARBONATE: 20.9 meq/L (ref 20.0–24.0)
DRAWN BY: 232811
O2 Content: 3 L/min
O2 SAT: 93.4 %
Patient temperature: 97.9
TCO2: 18.9 mmol/L (ref 0–100)
pCO2 arterial: 29 mmHg — ABNORMAL LOW (ref 35.0–45.0)
pH, Arterial: 7.469 — ABNORMAL HIGH (ref 7.350–7.450)
pO2, Arterial: 65.1 mmHg — ABNORMAL LOW (ref 80.0–100.0)

## 2014-07-02 LAB — CBC WITH DIFFERENTIAL/PLATELET
BASOS PCT: 0 % (ref 0–1)
Basophils Absolute: 0 10*3/uL (ref 0.0–0.1)
EOS ABS: 0 10*3/uL (ref 0.0–0.7)
EOS PCT: 0 % (ref 0–5)
HCT: 35 % — ABNORMAL LOW (ref 39.0–52.0)
Hemoglobin: 10.8 g/dL — ABNORMAL LOW (ref 13.0–17.0)
LYMPHS ABS: 0.5 10*3/uL — AB (ref 0.7–4.0)
Lymphocytes Relative: 2 % — ABNORMAL LOW (ref 12–46)
MCH: 28.3 pg (ref 26.0–34.0)
MCHC: 30.9 g/dL (ref 30.0–36.0)
MCV: 91.6 fL (ref 78.0–100.0)
MONO ABS: 0.5 10*3/uL (ref 0.1–1.0)
Monocytes Relative: 2 % — ABNORMAL LOW (ref 3–12)
NEUTROS PCT: 96 % — AB (ref 43–77)
Neutro Abs: 23.4 10*3/uL — ABNORMAL HIGH (ref 1.7–7.7)
Platelets: 288 10*3/uL (ref 150–400)
RBC: 3.82 MIL/uL — AB (ref 4.22–5.81)
RDW: 17.6 % — AB (ref 11.5–15.5)
WBC: 24.4 10*3/uL — ABNORMAL HIGH (ref 4.0–10.5)

## 2014-07-02 LAB — TSH: TSH: 2.002 u[IU]/mL (ref 0.350–4.500)

## 2014-07-02 LAB — GLUCOSE, CAPILLARY
GLUCOSE-CAPILLARY: 289 mg/dL — AB (ref 70–99)
GLUCOSE-CAPILLARY: 145 mg/dL — AB (ref 70–99)
Glucose-Capillary: 289 mg/dL — ABNORMAL HIGH (ref 70–99)
Glucose-Capillary: 72 mg/dL (ref 70–99)

## 2014-07-02 LAB — AMMONIA

## 2014-07-02 LAB — URIC ACID: URIC ACID, SERUM: 10.1 mg/dL — AB (ref 4.0–7.8)

## 2014-07-02 LAB — TROPONIN I: TROPONIN I: 0.04 ng/mL — AB (ref <0.031)

## 2014-07-02 MED ORDER — ENOXAPARIN SODIUM 30 MG/0.3ML ~~LOC~~ SOLN: 30.0000 mg | SUBCUTANEOUS | Status: DC

## 2014-07-02 MED ORDER — HEPARIN SODIUM (PORCINE) 5000 UNIT/ML IJ SOLN
5000.0000 [IU] | Freq: Three times a day (TID) | INTRAMUSCULAR | Status: DC
  Administered 2014-07-02 – 2014-07-05 (×10): 5000 [IU] via SUBCUTANEOUS
  Filled 2014-07-02 (×12): qty 1

## 2014-07-02 MED ORDER — SODIUM CHLORIDE 0.9 % IV BOLUS (SEPSIS)
1000.0000 mL | Freq: Once | INTRAVENOUS | Status: AC
  Administered 2014-07-02: 1000 mL via INTRAVENOUS

## 2014-07-02 MED ORDER — OMEGA-3-ACID ETHYL ESTERS 1 G PO CAPS
1.0000 g | ORAL_CAPSULE | Freq: Every day | ORAL | Status: DC
  Administered 2014-07-02 – 2014-07-05 (×4): 1 g via ORAL
  Filled 2014-07-02 (×4): qty 1

## 2014-07-02 MED ORDER — SODIUM CHLORIDE 0.9 % IV SOLN
INTRAVENOUS | Status: DC
  Administered 2014-07-02: 03:00:00 via INTRAVENOUS

## 2014-07-02 MED ORDER — INSULIN ASPART 100 UNIT/ML ~~LOC~~ SOLN
0.0000 [IU] | Freq: Three times a day (TID) | SUBCUTANEOUS | Status: DC
  Administered 2014-07-02: 8 [IU] via SUBCUTANEOUS
  Administered 2014-07-02: 2 [IU] via SUBCUTANEOUS
  Administered 2014-07-02: 8 [IU] via SUBCUTANEOUS
  Administered 2014-07-03: 3 [IU] via SUBCUTANEOUS
  Administered 2014-07-03: 8 [IU] via SUBCUTANEOUS
  Administered 2014-07-03: 3 [IU] via SUBCUTANEOUS
  Administered 2014-07-04: 8 [IU] via SUBCUTANEOUS
  Administered 2014-07-04 (×2): 11 [IU] via SUBCUTANEOUS
  Administered 2014-07-05 (×2): 5 [IU] via SUBCUTANEOUS

## 2014-07-02 MED ORDER — DEXTROSE 5 % IV SOLN
1.0000 g | Freq: Once | INTRAVENOUS | Status: AC
  Administered 2014-07-02: 1 g via INTRAVENOUS
  Filled 2014-07-02: qty 1

## 2014-07-02 MED ORDER — CEFEPIME HCL 2 G IJ SOLR: 500.0000 mg | INTRAMUSCULAR | Status: DC

## 2014-07-02 MED ORDER — EZETIMIBE 10 MG PO TABS
10.0000 mg | ORAL_TABLET | Freq: Every day | ORAL | Status: DC
  Administered 2014-07-02 – 2014-07-05 (×4): 10 mg via ORAL
  Filled 2014-07-02 (×4): qty 1

## 2014-07-02 MED ORDER — VANCOMYCIN HCL IN DEXTROSE 1-5 GM/200ML-% IV SOLN
1000.0000 mg | INTRAVENOUS | Status: DC
  Administered 2014-07-02 – 2014-07-04 (×2): 1000 mg via INTRAVENOUS
  Filled 2014-07-02 (×2): qty 200

## 2014-07-02 MED ORDER — DOCUSATE SODIUM 100 MG PO CAPS
100.0000 mg | ORAL_CAPSULE | Freq: Two times a day (BID) | ORAL | Status: DC
  Administered 2014-07-02: 100 mg via ORAL
  Filled 2014-07-02 (×6): qty 1

## 2014-07-02 MED ORDER — METOPROLOL TARTRATE 12.5 MG HALF TABLET: 12.5000 mg | ORAL_TABLET | Freq: Two times a day (BID) | ORAL | Status: DC

## 2014-07-02 MED ORDER — STERILE WATER FOR INJECTION IJ SOLN
INTRAMUSCULAR | Status: AC
  Filled 2014-07-02: qty 10

## 2014-07-02 MED ORDER — INSULIN ASPART 100 UNIT/ML ~~LOC~~ SOLN
0.0000 [IU] | Freq: Every day | SUBCUTANEOUS | Status: DC
  Administered 2014-07-03: 3 [IU] via SUBCUTANEOUS

## 2014-07-02 MED ORDER — AMLODIPINE BESYLATE 5 MG PO TABS
7.5000 mg | ORAL_TABLET | Freq: Every day | ORAL | Status: DC
  Administered 2014-07-02: 7.5 mg via ORAL
  Filled 2014-07-02: qty 2

## 2014-07-02 MED ORDER — ASPIRIN 325 MG PO TABS
325.0000 mg | ORAL_TABLET | Freq: Every day | ORAL | Status: DC
  Administered 2014-07-02 – 2014-07-05 (×4): 325 mg via ORAL
  Filled 2014-07-02 (×4): qty 1

## 2014-07-02 MED ORDER — INSULIN GLARGINE 100 UNIT/ML ~~LOC~~ SOLN
26.0000 [IU] | Freq: Every day | SUBCUTANEOUS | Status: DC
  Administered 2014-07-03 – 2014-07-04 (×2): 26 [IU] via SUBCUTANEOUS
  Filled 2014-07-02 (×2): qty 0.26

## 2014-07-02 MED ORDER — ASPIRIN 81 MG PO TABS: 81.0000 mg | ORAL_TABLET | Freq: Every day | ORAL | Status: DC

## 2014-07-02 MED ORDER — DEXTROSE 5 % IV SOLN
1.0000 g | INTRAVENOUS | Status: DC
  Administered 2014-07-02 – 2014-07-04 (×3): 1 g via INTRAVENOUS
  Filled 2014-07-02 (×3): qty 1

## 2014-07-02 MED ORDER — INSULIN GLARGINE 100 UNIT/ML ~~LOC~~ SOLN
20.0000 [IU] | Freq: Every day | SUBCUTANEOUS | Status: DC
Start: 2014-07-02 — End: 2014-07-02
  Administered 2014-07-02: 20 [IU] via SUBCUTANEOUS
  Filled 2014-07-02: qty 0.2

## 2014-07-02 MED ORDER — SODIUM CHLORIDE 0.9 % IV SOLN
INTRAVENOUS | Status: AC
  Administered 2014-07-02 – 2014-07-03 (×2): via INTRAVENOUS

## 2014-07-02 MED ORDER — METHYLPREDNISOLONE SODIUM SUCC 40 MG IJ SOLR
20.0000 mg | Freq: Every day | INTRAMUSCULAR | Status: DC
  Administered 2014-07-02: 20 mg via INTRAVENOUS
  Filled 2014-07-02: qty 1

## 2014-07-02 MED ORDER — VANCOMYCIN HCL 500 MG IV SOLR
500.0000 mg | Freq: Once | INTRAVENOUS | Status: AC
  Administered 2014-07-02: 500 mg via INTRAVENOUS
  Filled 2014-07-02: qty 500

## 2014-07-02 MED ORDER — FERROUS SULFATE 325 (65 FE) MG PO TABS
325.0000 mg | ORAL_TABLET | Freq: Every day | ORAL | Status: DC
  Administered 2014-07-02 – 2014-07-05 (×4): 325 mg via ORAL
  Filled 2014-07-02 (×4): qty 1

## 2014-07-02 MED ORDER — PREDNISONE 20 MG PO TABS
40.0000 mg | ORAL_TABLET | Freq: Every day | ORAL | Status: DC
  Administered 2014-07-03 – 2014-07-05 (×3): 40 mg via ORAL
  Filled 2014-07-02 (×3): qty 2

## 2014-07-02 MED ORDER — METOPROLOL TARTRATE 25 MG PO TABS
50.0000 mg | ORAL_TABLET | Freq: Two times a day (BID) | ORAL | Status: DC
  Administered 2014-07-02: 50 mg via ORAL
  Filled 2014-07-02: qty 2

## 2014-07-02 MED ORDER — ASPIRIN 300 MG RE SUPP
300.0000 mg | Freq: Every day | RECTAL | Status: DC
  Filled 2014-07-02 (×2): qty 1

## 2014-07-02 NOTE — Consult Note (Addendum)
Renal Service Consult Note [facility]  [name] [name] 07/02/2014 [name] [name] Requesting Physician:  Dr Clementeen Graham  Reason for Consult:  Acute on CKD HPI: The patient is a 81 y.o. year-old with hx of DM, HTN, CAD hx CABG, afib, PVD. Had CABG in 2006.  Admitted on 07/01/14 AMS and lethargy.  ED eval head CT was negative, ABG was oK.  WBC up, UA pyuria, chronic indwelling foley [name]/t paraplegia. Admitted with AMS, r/o UTI, acute on CKD.  Also CAF, DM2, HTN, hx TAA repair and CABG in past. Paraplegic.   Patient not providing much history. Somnolent.    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, UTI, DM, HTN 9/02 - R L5-S1 laminectomy for dural AVM/AVF formation, neurogenic bowel and bladder, HTN, DM 3/04 - contained rupture of ascending aortic aneurysm, s/p median sternotomy w resection and grafting of ruptured ascend aortic aneurysm; cardiac tamponade [name]/t inadvertant placement of catheter into R ventricle, 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 Brought back for diamondback  orbital rotational atherectomy with improvement of obstruction from 100 > 20%  11/18 - 05/21/14 > SOB x 1wk, constipated, on daily enemas at home, AMS, anemic > found to have acute resp failure w hypoxemia, CHF vs PNA. CT showed advanced IS lung disease.  Rec'[name] abx for CAP, and AMS w confusion. Wife noted pt has confusion at baseline. DM 2, and CKD creat 2.11.  Afib on amio. HTN on norvasc , MTP.  12/21 - 06/30/14 > acute resp failure with possible ILD/ BOOP or amiodarone pulm toxicity, pulm edema and PNA other considerations. Rx IV steroids. No better with abx.  Diuresed 10L with IV lasix then switched to po pred.  OP pulm f/u. Unclear casue of lung disease. PAF, pyuria.  Acute on CKD 3.  Creat 2.5 w diuresis.     ROS  n/a  Past Medical History  Past Medical History  Diagnosis Date  . Peripheral vascular disease     critical limb ischemia  . Myocardial infarction   . Diabetes mellitus without complication   . Hypertension   . Coronary artery disease   . Atrial fibrillation   . Aortic dissection     w/ Cardiac tamponade  . AVM (arteriovenous malformation)   . Murmur   . Hyperlipidemia   . Neuropathy of left lower extremity   . S/P CABG (coronary artery bypass graft)  Past Surgical History  Past Surgical History  Procedure Laterality Date  . Coronary artery bypass graft  02/11/2005    LIMA to LAD, SVG to 1st diag, SVG to OM1, and SVG to PD  . Back surgery    . Atherectomy  09/15/2012    Diamondback orbital rotation atherectomy was performed using a 2 mm solid crown up to a max of 120,000 RPM. Angioplasty was performed using a 5x114mm long chocolate balloon for 2 minute inflations. Resulting in reduction of an 80% calcified distal R SFA stenosis to less than 20%  . Lea doppler  10/01/2012    R SFA demonstrated a mild amount of residual plaque suggesting less then 50% diametere reduction. R Calf runoff-one vessel runoff via peroneal artery. The posterior and anterior tibial arteries  appeared occluded.  . Cardiac catheterization  02/04/2005    Severe 3-vessel disease affecting LAD and RCA. Consider CABG  . Cardiovascular stress test  12/16/2006    EKG negative for ischemia. No ECG changes. No significant ischemia demonstrated  . Transthoracic echocardiogram  12/04/2011    EF 50-55%, moderate aortic root dilation  . Atherectomy N/A 09/15/2012    Procedure: ATHERECTOMY;  Surgeon: Lorretta Harp, MD;  Location: [facility] CATH LAB;  Service: Cardiovascular;  Laterality: N/A;   Family History  Family History  Problem Relation Age of Onset  . Hypertension Father    Social History  reports that he quit smoking about 22 years ago. He has never used smokeless tobacco. He reports that he drinks alcohol. He reports that he does not use illicit drugs. Allergies  Allergies  Allergen Reactions  . Cymbalta [Duloxetine Hcl] Other (See Comments)    Confused, paralyzed from waist up   . Statins Other (See Comments)    Leg pains   Home medications Prior to Admission medications   Medication Sig Start Date End Date Taking? Authorizing Provider  amLODipine (NORVASC) 2.5 MG tablet Take 3 tablets (7.5 mg total) by mouth daily. 06/30/14  Yes Orson Eva, MD  aspirin 81 MG tablet Take 81 mg by mouth daily with breakfast.    Yes Historical Provider, MD  calcium carbonate (OS-CAL - DOSED IN MG OF ELEMENTAL CALCIUM) 1250 MG tablet Take 1 tablet by mouth 2 (two) times daily with a meal.    Yes Historical Provider, MD  docusate sodium 100 MG CAPS Take 100 mg by mouth 2 (two) times daily. 06/30/14  Yes Orson Eva, MD  ezetimibe (ZETIA) 10 MG tablet Take 1 tablet (10 mg total) by mouth daily. Patient taking differently: Take 10 mg by mouth daily with breakfast.  12/29/12  Yes Lorretta Harp, MD  furosemide (LASIX) 20 MG tablet Take 1 tablet (20 mg total) by mouth daily. 07/01/14  Yes Orson Eva, MD  gabapentin (NEURONTIN) 300 MG capsule Take 1 capsule (300 mg total) by mouth 2 (two) times daily. 06/30/14  Yes  Orson Eva, MD  insulin aspart (NOVOLOG) 100 UNIT/ML FlexPen Inject 5 Units into the skin 4 (four) times daily.   Yes Historical Provider, MD  insulin glargine (LANTUS) 100 UNIT/ML injection Inject 20 Units into the skin daily with breakfast.    Yes Historical Provider, MD  metoprolol (LOPRESSOR) 50 MG tablet Take 50 mg by mouth 2 (two) times daily. 10/28/13  Yes Troy Sine, MD  MILK THISTLE PO Take 1 tablet by mouth daily with breakfast.   Yes Historical Provider, MD  Multiple Vitamin (MULTIVITAMIN WITH MINERALS) TABS tablet Take 1 tablet by mouth daily.  Yes Historical Provider, MD  Omega-3 Fatty Acids (FISH OIL PO) Take 1 capsule by mouth 2 (two) times daily.   Yes Historical Provider, MD  oxyCODONE-acetaminophen (PERCOCET/ROXICET) 5-325 MG per tablet Take 1-2 tablets by mouth See admin instructions. 2 tabs every morning, 1 tab in the afternoon and 2 tabs at bedtime 06/30/14  Yes Orson Eva, MD  potassium chloride (K-DUR,KLOR-CON) 10 MEQ tablet Take 1 tablet (10 mEq total) by mouth daily. 06/30/14  Yes Orson Eva, MD  predniSONE (DELTASONE) 10 MG tablet Take 20 mg by mouth daily with breakfast.   Yes Historical Provider, MD  Probiotic Product (PROBIOTIC DAILY) CAPS Take 1 capsule by mouth every evening.   Yes Historical Provider, MD  ferrous sulfate 325 (65 FE) MG tablet Take 1 tablet (325 mg total) by mouth daily with breakfast. 05/21/14   Maryann Mikhail, DO  predniSONE (DELTASONE) 20 MG tablet Take 2 tablets (40 mg total) by mouth daily with breakfast. Patient not taking: Reported on 07/01/2014 06/30/14   Orson Eva, MD   Liver Function Tests  Recent Labs Lab 07/01/14 2215 07/02/14 0418  AST 13 13  ALT 33 31  ALKPHOS 69 66  BILITOT 1.0 0.9  PROT 7.0 6.6  ALBUMIN 3.1* 2.8*    Recent Labs Lab 07/01/14 2215  LIPASE 22   CBC  Recent Labs Lab 06/29/14 0410 07/01/14 2102 07/02/14 0418  WBC 11.7* 29.8* 24.4*  NEUTROABS  --  28.6* 23.4*  HGB 10.2* 11.6* 10.8*  HCT 32.2* 36.9*  35.0*  MCV 90.2 92.0 91.6  PLT 340 299 229   Basic Metabolic Panel  Recent Labs Lab 06/26/14 0812 06/27/14 0342 06/28/14 0331 06/29/14 0410 06/30/14 0415 07/01/14 2215 07/02/14 0418  NA 139 142 142 142 143 142 138  K 4.4 4.1 4.2 3.7 3.6 4.8 4.6  CL 113* 111 107 105 107 111 107  CO2 $Re'21 23 26 27 27 23 23  'zDl$ GLUCOSE 274* 314* 279* 269* 96 243* 286*  BUN 66* 72* 81* 97* 98* 122* 129*  CREATININE 1.91* 1.97* 2.20* 2.50* 2.48* 3.11* 3.39*  CALCIUM 8.9 9.1 9.5 9.1 8.7 8.6 8.3*  PHOS  --   --   --  4.2  --   --   --     Filed Vitals:   07/02/14 1600 07/02/14 1700 07/02/14 1800 07/02/14 1900  BP: 89/25 157/23 112/27 106/11  Pulse: 47 49 53 53  Temp: 97.4 F (36.3 C)     TempSrc: Oral     Resp: $Remo'19 15 13 16  'aloDt$ Height:      Weight:      SpO2: 98% 95% 97% 98%   Exam Lethargic, chronically ill, no distress, responds appropriately No rash, cyanosis or gangrene Sclera anicteric, throat clear No jvd, flat neck veins Chest clear bilat RRR no MRG Abd soft, NTND, no mass or ascites No LE edema, cachectic No UE edema Neuro is gen weak, Ox   UA - 1.013, pH 5.5, prot 100, large LE, large Hb, few yeast, TNTC wbc, 7-10 rbc CXR 07/01/14 - diffuse IS lung disease CT abd today showed bilat hydroureteronephrosis with bladder distension and Foley balloon in pt's urethra    Assessment: 1. Acute on CKD 4 - bilat hydro on renal US w Foley tip lodged in urethra.  AKI likely due to bladder outlet obstruction. Foley has been repositioned just a few minutes ago.  Will do bladder scan. Also could use more volume, will increase IVF's.  Baseline creat 1.9- 2.1.  He  is a poor candidate for long-term dialysis given comorbidities. Recommend conservative care.  2. Vol depletion - on exam, BP's soft 3. Paraplegia 4. Pyuria - UCx, pending, on ABx 5. Neurogenic bladder - chronic indwelling foley 6. Hx CABG 7. Hx TAA repair c/b paraplegia 8. Severe interstitial lung disease - possibly [name]/t  amio 9. Afib 10. DM2 11. HTN - on norvasc, MTP with low BP's.    Plan- increase IVF's, keep Foley in.   Kelly Splinter MD (pgr) [phone]    [phone] 07/02/2014, 8:07 PM

## 2014-07-02 NOTE — Progress Notes (Signed)
Call received from Dr. Gonzella Lex at approx 1800 reportingt the CT showed that the patients Urinary catheter balloon was noted to be in urethra.  Pt c/o no pain, Urine has been flowing throughout shift. Catheter advanced and received milky colored urine, approximately 44ml's immediately. Notified MD of urine color.  No fever at this time.  Ok Edwards, RN

## 2014-07-02 NOTE — Plan of Care (Signed)
Problem: Phase I Progression Outcomes Goal: Voiding-avoid urinary catheter unless indicated Outcome: Not Met (add Reason) Pt with chronic foley use

## 2014-07-02 NOTE — Progress Notes (Signed)
[facility] PROGRESS NOTE  [name] [name] ZOX:[id number] DOB: [birth year] DOA: 07/01/2014 PCP: [name], MD  Brief narrative 81 year old male with history of CAD status post CABG, A. Fib(of anticoagulation secondary to fall), uncontrolled type 2 diabetes mellitus, chronic kidney disease stage II (baseline creatinine around 1.7) hypertension with recent hospitalization for acute respiratory failure secondary to amiodarone toxicity and discharged skilled nursing facility on oral prednisone was sent to the hospital due to acute encephalopathy . Workup done in ED showed mild hypercarbia on ABG, normal CT, ? UTI on UA given chronic foley, and significant leukocytosis. Patient also had acute worsening of his kidney disease. Patient admitted to stepdown for closer monitoring.   Assessment/Plan: Acute encephalopathy Likely metabolic associated with uremia versus underlying infection. Head CT and MRI brain negative for stroke. -Continue with neuro checks. Continue empiric vancomycin and cefepime for possible infection. Follow urine culture and blood culture. Ultrasound abdomen suggestive of thickened bladder due to cystitis. -Avoid benzos or narcotics.  Acute on chronic kidney disease stage II-III Baseline creatinine hold the past 6 months of around 1.4. Renal function had worsened during recent hospitalization likely due to aggressive diuresis and UTI. This is further worsened this admission. He is now euvolemic. Lasix held on admission. Ultrasound of the abdomen showing left>RT hydronephrosis with possible stone within the dilated left UPJ. Also suspected of malposition of 40 catheter. Recommend further evaluation with abdominal and pelvic CT. Will order. Urine output seems appropriate. -Check FeNa. Avoid nephrotoxins. -Nephrology consulted.  Leukocytosis Secondary infection versus steroid use. Patient was on IVs Solu-Medrol for respiratory failure due to amiodarone toxicity and  discharged on oral prednisone. Continue empiric antibiotics and steroid. Follow culture  UTI As seen on UA. Has chronic indwelling catheter. Ultrasound abdomen suggestive of cystitis. Follow urine culture. Continue empiric antibiotics  Atrial fibrillation Rate controlled. Continue aspirin and metoprolol. Amiodarone discontinued during recent hospitalization. Not on Coumadin due to fall risk.  Type 2 diabetes mellitus Uncontrolled. Will increase his Lantus dose. Continue sliding scale insulin. Last A1c of 6.5.  Diastolic CHF Patient recently hospitalized for CHF exacerbation and diabetes with IV Lasix. Currently appears hypovolemic. Hold Lasix.   CAD with hx fo MI s/p CABG Continue aspirin, metoprolol and Zetia  Iron def anemia Continue ferrous sulfate  Code Status: Full code Family Communication: None at bedside Disposition Plan: continue step down monitoring   Consultants:  Nephrology    Procedures:  MRI brain  Ultrasound abdomen  Antibiotics: IV vancomycin and cefepime   HPI/Subjective: Patient seen and examined. Admission H&P reviewed. Appears quite confused.  Objective: Filed Vitals:   07/02/14 1000  BP: 140/24  Pulse: 60  Temp:   Resp: 12    Intake/Output Summary (Last 24 hours) at 07/02/14 1208 Last data filed at 07/02/14 1100  Gross per 24 hour  Intake    613 ml  Output   1420 ml  Net   -807 ml   Filed Weights   07/02/14 0200  Weight: 67.5 kg (148 lb 13 oz)    Exam:   General:  Elderly male lying in bed appears confused and sleepy  HEENT: No pallor, moist oral mucosa, no JVD  Chest: Clear to auscultation bilaterally  CVS: S1 and S2 irregular, no murmurs rub or gallop  Abdomen: Soft, nondistended, nontender, chronic foley  Extremities: Warm, no edema  CNS: sleepy but Awake on commands, oriented to place only, flapping tremors    Data Reviewed: Basic Metabolic Panel:  Recent Labs Lab 06/28/14 0331 06/29/14 0410  06/30/14 0415 07/01/14 2215 07/02/14 0418  NA 142 142 143 142 138  K 4.2 3.7 3.6 4.8 4.6  CL 107 105 107 111 107  CO2 26 27 27 23 23   GLUCOSE 279* 269* 96 243* 286*  BUN 81* 97* 98* 122* 129*  CREATININE 2.20* 2.50* 2.48* 3.11* 3.39*  CALCIUM 9.5 9.1 8.7 8.6 8.3*  MG  --  2.3 2.4  --   --   PHOS  --  4.2  --   --   --    Liver Function Tests:  Recent Labs Lab 07/01/14 2215 07/02/14 0418  AST 13 13  ALT 33 31  ALKPHOS 69 66  BILITOT 1.0 0.9  PROT 7.0 6.6  ALBUMIN 3.1* 2.8*    Recent Labs Lab 07/01/14 2215  LIPASE 22    Recent Labs Lab 07/02/14 0129  AMMONIA <9*   CBC:  Recent Labs Lab 06/26/14 0812 06/27/14 0342 06/29/14 0410 07/01/14 2102 07/02/14 0418  WBC 11.8* 10.1 11.7* 29.8* 24.4*  NEUTROABS  --   --   --  28.6* 23.4*  HGB 9.0* 9.0* 10.2* 11.6* 10.8*  HCT 28.8* 29.1* 32.2* 36.9* 35.0*  MCV 91.1 89.8 90.2 92.0 91.6  PLT 257 259 340 299 288   Cardiac Enzymes:  Recent Labs Lab 07/02/14 0330  TROPONINI 0.04*   BNP (last 3 results)  Recent Labs  05/18/14 1410 06/20/14 1202  PROBNP 4183.0* 14250.0*   CBG:  Recent Labs Lab 06/29/14 1654 06/29/14 2159 06/30/14 0747 06/30/14 0811 07/02/14 0810  GLUCAP 279* 175* 41* 70 289*    Recent Results (from the past 240 hour(s))  MRSA PCR Screening     Status: None   Collection Time: 06/26/14  6:53 AM  Result Value Ref Range Status   MRSA by PCR NEGATIVE NEGATIVE Final    Comment:        The GeneXpert MRSA Assay (FDA approved for NASAL specimens only), is one component of a comprehensive MRSA colonization surveillance program. It is not intended to diagnose MRSA infection nor to guide or monitor treatment for MRSA infections.   Urine culture     Status: None   Collection Time: 06/26/14  6:23 PM  Result Value Ref Range Status   Specimen Description URINE, CATHETERIZED  Final   Special Requests NONE  Final   Colony Count NO GROWTH Performed at [facility]   Final    Culture NO GROWTH Performed at [facility]   Final   Report Status 06/28/2014 FINAL  Final     Studies: Dg Chest 2 View  07/01/2014   CLINICAL DATA:  Altered mental status. Weakness. Patient not able to speak.  EXAM: CHEST  2 VIEW  COMPARISON:  06/29/2014  FINDINGS: Postoperative changes in the mediastinum. Low lung volumes with diffuse interstitial changes consistent with fibrosis. No superimposed airspace disease or effusion. No pneumothorax. Calcified and tortuous aorta. Heart size and pulmonary vascularity are normal. No change since previous study.  IMPRESSION: Diffuse pulmonary fibrosis with associated low lung volumes. No superimposed airspace disease.   Electronically Signed   By: Burman Nieves M.D.   On: 07/01/2014 22:17   Ct Head Wo Contrast  07/01/2014   CLINICAL DATA:  Weakness. Lethargy. Sleeping a lot and unable to wake. Confusion.  EXAM: CT HEAD WITHOUT CONTRAST  TECHNIQUE: Contiguous axial images were obtained from the base of the skull through the vertex without intravenous contrast.  COMPARISON:  06/20/2014  FINDINGS: Diffuse cerebral atrophy. Mild ventricular dilatation  consistent with central atrophy. Patchy low-attenuation changes in the deep white matter consistent with small vessel ischemia. Extra-axial fluid collection in the middle cranial fossa consistent with arachnoid cyst. No change since prior study. No mass effect or midline shift. No abnormal extra-axial fluid collections. Gray-white matter junctions are distinct. Basal cisterns are not effaced. No evidence of acute intracranial hemorrhage. No depressed skull fractures. Partial opacification of the left frontal sinuses. Paranasal sinuses are otherwise clear. Mastoid air cells are not opacified. Extensive vascular calcifications.  IMPRESSION: No acute intracranial abnormalities. Extra-axial cystic collection in the right middle cranial fossa. Chronic atrophy and small vessel ischemic changes. No change since  previous study.   Electronically Signed   By: Burman Nieves M.D.   On: 07/01/2014 22:20   Mr Brain Wo Contrast  07/02/2014   CLINICAL DATA:  Acute encephalopathy. Altered mental status. Lethargic yesterday but became unresponsive this morning.  EXAM: MRI HEAD WITHOUT CONTRAST  TECHNIQUE: Multiplanar, multiecho pulse sequences of the brain and surrounding structures were obtained without intravenous contrast.  COMPARISON:  Head CT 07/01/2013  FINDINGS: Diffusion imaging does not show any acute or subacute infarction. There are minimal chronic small-vessel changes of the pons. No cerebellar insult. The cerebral hemispheres show mild age related atrophy with minimal small vessel change of the deep white matter. There is a chronic arachnoid cyst in the middle cranial fossa anteriorly on the right, unchanged since previous studies. No mass lesion, hemorrhage, hydrocephalus or extra-axial/subdural hematoma. No pituitary mass. No inflammatory sinus disease. No skull or skullbase lesion.  IMPRESSION: No acute or reversible finding.  Mild age related atrophy and minimal small vessel change.  Chronic insignificant right middle cranial fossa arachnoid cyst.   Electronically Signed   By: Paulina Fusi M.D.   On: 07/02/2014 11:34   US Renal  07/02/2014   CLINICAL DATA:  Acute kidney injury  EXAM: RENAL/URINARY TRACT ULTRASOUND COMPLETE  COMPARISON:  CT of 06/20/2014  FINDINGS: Right Kidney:  Length: 10.1 cm. Mildly increased in echogenicity. Mild to moderate hydroureteronephrosis. Upper pole 2.9 cm cyst or minimally complex cyst.  Left Kidney:  Length: 11.2 cm. Mildly increased echogenicity. Moderate left-sided hydroureteronephrosis. Suspect a stone within the region of the left ureteropelvic junction. 1.4 cm on image 39.  Bladder:  Bladder distension with debris within. Foley catheter identified, possibly malpositioned. Example image 75. Bladder wall thickening, 6 mm.  IMPRESSION: 1. Left greater than right  hydronephrosis. There may be a stone within the dilated left ureteropelvic junction. This may be due to the bladder process. 2. Increased renal echogenicity, suggesting medical renal disease. 3. Bladder distension with debris within and wall thickening. Suspicious for cystitis. 4. Suspicion of malpositioned Foley catheter. Suboptimally evaluated. Consider further evaluation with abdominal pelvic CT. These results were called by telephone at the time of interpretation on 07/02/2014 at 11:54 am to [facility], Who verbally acknowledged these results.   Electronically Signed   By: Jeronimo Greaves M.D.   On: 07/02/2014 11:56    Scheduled Meds: . amLODipine  7.5 mg Oral Daily  . aspirin  300 mg Rectal Daily   Or  . aspirin  325 mg Oral Daily  . [START ON 07/03/2014] ceFEPime (MAXIPIME) IV  500 mg Intravenous Q24H  . docusate sodium  100 mg Oral BID  . ezetimibe  10 mg Oral Daily  . ferrous sulfate  325 mg Oral Q breakfast  . heparin subcutaneous  5,000 Units Subcutaneous 3 times per day  . insulin aspart  0-15  Units Subcutaneous TID WC  . insulin aspart  0-5 Units Subcutaneous QHS  . insulin glargine  20 Units Subcutaneous Q breakfast  . metoprolol  50 mg Oral BID  . omega-3 acid ethyl esters  1 g Oral Daily  . [START ON 07/03/2014] predniSONE  40 mg Oral Q breakfast  . vancomycin  1,000 mg Intravenous Q48H   Continuous Infusions: . sodium chloride 10 mL/hr at 07/02/14 0328      Time spent: 35 minutes    [name] [name]  [facility] Pager [phone]. If 7PM-7AM, please contact night-coverage at [email or web address], password [facility] 07/02/2014, 12:08 PM  LOS: 1 day

## 2014-07-02 NOTE — Progress Notes (Signed)
ANTIBIOTIC CONSULT NOTE - INITIAL  Pharmacy Consult for Vancomycin & Cefepime Indication: Sepsis  Allergies  Allergen Reactions  . Cymbalta [Duloxetine Hcl] Other (See Comments)    Confused, paralyzed from waist up   . Statins Other (See Comments)    Leg pains    Patient Measurements: Height: 6' (182.9 cm) Weight: 148 lb 13 oz (67.5 kg) IBW/kg (Calculated) : 77.6  Vital Signs: Temp: 98 F (36.7 C) (01/02 0340) Temp Source: Oral (01/02 0340) BP: 154/66 mmHg (01/02 0327) Pulse Rate: 65 (01/02 0327) Intake/Output from previous day: 01/01 0701 - 01/02 0700 In: 235 [P.O.:120; I.V.:65; IV Piggyback:50] Out: 565 [Urine:565] Intake/Output from this shift: Total I/O In: 235 [P.O.:120; I.V.:65; IV Piggyback:50] Out: 565 [Urine:565]  Labs:  Recent Labs  06/29/14 0410 06/30/14 0415 07/01/14 2102 07/01/14 2215  WBC 11.7*  --  29.8*  --   HGB 10.2*  --  11.6*  --   PLT 340  --  299  --   CREATININE 2.50* 2.48*  --  3.11*   Estimated Creatinine Clearance: 17.2 mL/min (by C-G formula based on Cr of 3.11). No results for input(s): VANCOTROUGH, VANCOPEAK, VANCORANDOM, GENTTROUGH, GENTPEAK, GENTRANDOM, TOBRATROUGH, TOBRAPEAK, TOBRARND, AMIKACINPEAK, AMIKACINTROU, AMIKACIN in the last 72 hours.   Microbiology: Recent Results (from the past 720 hour(s))  Culture, blood (routine x 2)     Status: None   Collection Time: 06/20/14  5:42 PM  Result Value Ref Range Status   Specimen Description BLOOD RIGHT ARM  Final   Special Requests BOTTLES DRAWN AEROBIC AND ANAEROBIC 10CC  Final   Culture  Setup Time   Final    06/21/2014 02:53 Performed at [facility]    Culture   Final    NO GROWTH 5 DAYS Performed at [facility]    Report Status 06/27/2014 FINAL  Final  Culture, blood (routine x 2)     Status: None   Collection Time: 06/20/14  5:50 PM  Result Value Ref Range Status   Specimen Description BLOOD LEFT HAND  Final   Special Requests BOTTLES DRAWN AEROBIC  ONLY 5CC  Final   Culture  Setup Time   Final    06/21/2014 02:56 Performed at [facility]    Culture   Final    NO GROWTH 5 DAYS Performed at [facility]    Report Status 06/27/2014 FINAL  Final  MRSA PCR Screening     Status: None   Collection Time: 06/26/14  6:53 AM  Result Value Ref Range Status   MRSA by PCR NEGATIVE NEGATIVE Final    Comment:        The GeneXpert MRSA Assay (FDA approved for NASAL specimens only), is one component of a comprehensive MRSA colonization surveillance program. It is not intended to diagnose MRSA infection nor to guide or monitor treatment for MRSA infections.   Urine culture     Status: None   Collection Time: 06/26/14  6:23 PM  Result Value Ref Range Status   Specimen Description URINE, CATHETERIZED  Final   Special Requests NONE  Final   Colony Count NO GROWTH Performed at [facility]   Final   Culture NO GROWTH Performed at [facility]   Final   Report Status 06/28/2014 FINAL  Final    Medical History: Past Medical History  Diagnosis Date  . Peripheral vascular disease     critical limb ischemia  . Myocardial infarction   . Diabetes mellitus without complication   .  Hypertension   . Coronary artery disease   . Atrial fibrillation   . Aortic dissection     w/ Cardiac tamponade  . AVM (arteriovenous malformation)   . Murmur   . Hyperlipidemia   . Neuropathy of left lower extremity   . S/P CABG (coronary artery bypass graft)     Medications:  Scheduled:  . amLODipine  7.5 mg Oral Daily  . aspirin  300 mg Rectal Daily   Or  . aspirin  325 mg Oral Daily  . [START ON 07/03/2014] ceFEPime (MAXIPIME) IV  500 mg Intravenous Q24H  . docusate sodium  100 mg Oral BID  . enoxaparin (LOVENOX) injection  30 mg Subcutaneous Q24H  . ezetimibe  10 mg Oral Daily  . ferrous sulfate  325 mg Oral Q breakfast  . insulin aspart  0-15 Units Subcutaneous TID WC  . insulin aspart  0-5 Units Subcutaneous  QHS  . insulin glargine  20 Units Subcutaneous Q breakfast  . methylPREDNISolone (SOLU-MEDROL) injection  20 mg Intravenous Daily  . metoprolol  50 mg Oral BID  . omega-3 acid ethyl esters  1 g Oral Daily  . vancomycin  1,000 mg Intravenous Q48H   Infusions:  . sodium chloride 10 mL/hr at 07/02/14 1610   Assessment:  81 yr male with altered mental status.  Patient being admitted with acute encephalopathy, chronic AFib, leukocytosis, acute renal failure  Cefepime 1gm x 1 given in ED @ 01:41  Pharmacy consulted to continue dosing Cefepime as well as start Vancomycin for sepsis  CrCl ~ 17 ml/min  Blood and urine cultures ordered  Goal of Therapy:  Vancomycin trough level 15-20 mcg/ml  Plan:  Measure antibiotic drug levels at steady state Follow up culture results  Vancomycin 1gm IV q48h Cefepime  IV q24h  Jatorian Renault, Joselyn Glassman, PharmD 07/02/2014,3:49 AM

## 2014-07-02 NOTE — Progress Notes (Signed)
ANTIBIOTIC CONSULT NOTE   Pharmacy Consult for Vancomycin & Cefepime Indication: Sepsis  Allergies  Allergen Reactions  . Cymbalta [Duloxetine Hcl] Other (See Comments)    Confused, paralyzed from waist up   . Statins Other (See Comments)    Leg pains    Patient Measurements: Height: 6' (182.9 cm) Weight: 148 lb 13 oz (67.5 kg) IBW/kg (Calculated) : 77.6  Vital Signs: Temp: 97.9 F (36.6 C) (01/02 0800) Temp Source: Oral (01/02 0800) BP: 140/24 mmHg (01/02 1000) Pulse Rate: 60 (01/02 1000) Intake/Output from previous day: 01/01 0701 - 01/02 0700 In: 613 [P.O.:270; I.V.:93; IV Piggyback:250] Out: 1040 [Urine:1040] Intake/Output from this shift: Total I/O In: -  Out: 380 [Urine:380]  Labs:  Recent Labs  06/30/14 0415 07/01/14 2102 07/01/14 2215 07/02/14 0418  WBC  --  29.8*  --  24.4*  HGB  --  11.6*  --  10.8*  PLT  --  299  --  288  CREATININE 2.48*  --  3.11* 3.39*   Estimated Creatinine Clearance: 15.8 mL/min (by C-G formula based on Cr of 3.39). No results for input(s): VANCOTROUGH, VANCOPEAK, VANCORANDOM, GENTTROUGH, GENTPEAK, GENTRANDOM, TOBRATROUGH, TOBRAPEAK, TOBRARND, AMIKACINPEAK, AMIKACINTROU, AMIKACIN in the last 72 hours.   Microbiology: Recent Results (from the past 720 hour(s))  Culture, blood (routine x 2)     Status: None   Collection Time: 06/20/14  5:42 PM  Result Value Ref Range Status   Specimen Description BLOOD RIGHT ARM  Final   Special Requests BOTTLES DRAWN AEROBIC AND ANAEROBIC 10CC  Final   Culture  Setup Time   Final    06/21/2014 02:53 Performed at [facility]    Culture   Final    NO GROWTH 5 DAYS Performed at [facility]    Report Status 06/27/2014 FINAL  Final  Culture, blood (routine x 2)     Status: None   Collection Time: 06/20/14  5:50 PM  Result Value Ref Range Status   Specimen Description BLOOD LEFT HAND  Final   Special Requests BOTTLES DRAWN AEROBIC ONLY 5CC  Final   Culture  Setup Time    Final    06/21/2014 02:56 Performed at [facility]    Culture   Final    NO GROWTH 5 DAYS Performed at [facility]    Report Status 06/27/2014 FINAL  Final  MRSA PCR Screening     Status: None   Collection Time: 06/26/14  6:53 AM  Result Value Ref Range Status   MRSA by PCR NEGATIVE NEGATIVE Final    Comment:        The GeneXpert MRSA Assay (FDA approved for NASAL specimens only), is one component of a comprehensive MRSA colonization surveillance program. It is not intended to diagnose MRSA infection nor to guide or monitor treatment for MRSA infections.   Urine culture     Status: None   Collection Time: 06/26/14  6:23 PM  Result Value Ref Range Status   Specimen Description URINE, CATHETERIZED  Final   Special Requests NONE  Final   Colony Count NO GROWTH Performed at [facility]   Final   Culture NO GROWTH Performed at [facility]   Final   Report Status 06/28/2014 FINAL  Final   Assessment: 81 yr male with altered mental status. He was just discharged 12/31, during the stay patient had acute respiratory failure due to pulmonary edema, HCAP and ILD possibly d/t amiodarone toxicity. He received vancomycin/zosyn/azithromycin last admission  which was changed to PO cefuroxime. Patient being admitted with acute encephalopathy, chronic AFib, leukocytosis, acute renal failure  1/2 >>Vanc >> 1/2 >>Cefepime >>   Tmax: afeb WBCs: elevated (on steroids for possible amiodarone lung toxicity) Renal: Scr = 3.39 and increasing with normalized CrCl ~ 17. UOP looks to be adequate at this time (baseline ~1.4, but ranged from 2 - 3.5mg /dl with recent admission)  12/21 blood; NG 12/27 urine: NG 1/2 blood: pending 1/2 urine: pending   Goal of Therapy:  Vancomycin trough level 15-20 mcg/ml  Plan:   Give vancomycin  IV x 1 to complete loading dose and continue 1gm IV q48h for now  Based on renal function, may need to hold and dose  per levels  Change to cefepime 1gm IV q24h as appears has non-oliguric kidney failure  [name] [name], PharmD, BCPS.   Pager: [phone] 07/02/2014,12:42 PM

## 2014-07-02 NOTE — H&P (Addendum)
[facility] History and Physical  [name] [name] ZOX:[id number] DOB: [birth year] DOA: 07/01/2014  Referring physician: ER physician. PCP: [name], MD   History obtained from patient's wife. Patient is encephalopathic.  Chief Complaint: Lethargic.  HPI: [name] [name] is a 81 y.o. male with history of CAD status post CABG, diabetes mellitus type 2, hypertension, chronic atrial fibrillation was recently admitted for acute respiratory failure probably from amiodarone toxicity and was placed on prednisone and discharge to nursing home 2 days ago was brought to the ER after patient wife found that patient has become very lethargic. Patient wife stated that he was doing fine and tolerating well and yesterday morning when she went back to see the patient patient was found to be very drowsy and lethargic. No new pain medications were started. In the ER patient's ABG does not show any common dioxide retention and CT head did not show anything acute. Ammonia levels are pending. Labs repeated significant leukocytosis but patient has been afebrile. Patient's UA does show pyuria and patient has chronic indwelling catheter secondary to paraplegia.   Review of Systems: As presented in the history of presenting illness, rest negative.  Past Medical History  Diagnosis Date  . Peripheral vascular disease     critical limb ischemia  . Myocardial infarction   . Diabetes mellitus without complication   . Hypertension   . Coronary artery disease   . Atrial fibrillation   . Aortic dissection     w/ Cardiac tamponade  . AVM (arteriovenous malformation)   . Murmur   . Hyperlipidemia   . Neuropathy of left lower extremity   . S/P CABG (coronary artery bypass graft)    Past Surgical History  Procedure Laterality Date  . Coronary artery bypass graft  02/11/2005    LIMA to LAD, SVG to 1st diag, SVG to OM1, and SVG to PD  . Back surgery    . Atherectomy  09/15/2012    Diamondback orbital  rotation atherectomy was performed using a 2 mm solid crown up to a max of 120,000 RPM. Angioplasty was performed using a 5x13mm long chocolate balloon for 2 minute inflations. Resulting in reduction of an 80% calcified distal R SFA stenosis to less than 20%  . Lea doppler  10/01/2012    R SFA demonstrated a mild amount of residual plaque suggesting less then 50% diametere reduction. R Calf runoff-one vessel runoff via peroneal artery. The posterior and anterior tibial arteries appeared occluded.  . Cardiac catheterization  02/04/2005    Severe 3-vessel disease affecting LAD and RCA. Consider CABG  . Cardiovascular stress test  12/16/2006    EKG negative for ischemia. No ECG changes. No significant ischemia demonstrated  . Transthoracic echocardiogram  12/04/2011    EF 50-55%, moderate aortic root dilation  . Atherectomy N/A 09/15/2012    Procedure: ATHERECTOMY;  Surgeon: Runell Gess, MD;  Location: [facility] CATH LAB;  Service: Cardiovascular;  Laterality: N/A;   Social History:  reports that he quit smoking about 22 years ago. He has never used smokeless tobacco. He reports that he drinks alcohol. He reports that he does not use illicit drugs. Where does patient live nursing home. Can patient participate in ADLs? No.  Allergies  Allergen Reactions  . Cymbalta [Duloxetine Hcl] Other (See Comments)    Confused, paralyzed from waist up   . Statins Other (See Comments)    Leg pains    Family History:  Family History  Problem Relation Age of Onset  .  Hypertension Father       Prior to Admission medications   Medication Sig Start Date End Date Taking? Authorizing Provider  amLODipine (NORVASC) 2.5 MG tablet Take 3 tablets (7.5 mg total) by mouth daily. 06/30/14  Yes Catarina Hartshorn, MD  aspirin 81 MG tablet Take 81 mg by mouth daily with breakfast.    Yes Historical Provider, MD  calcium carbonate (OS-CAL - DOSED IN MG OF ELEMENTAL CALCIUM) 1250 MG tablet Take 1 tablet by mouth 2 (two) times daily  with a meal.    Yes Historical Provider, MD  docusate sodium 100 MG CAPS Take 100 mg by mouth 2 (two) times daily. 06/30/14  Yes Catarina Hartshorn, MD  ezetimibe (ZETIA) 10 MG tablet Take 1 tablet (10 mg total) by mouth daily. Patient taking differently: Take 10 mg by mouth daily with breakfast.  12/29/12  Yes Runell Gess, MD  furosemide (LASIX) 20 MG tablet Take 1 tablet (20 mg total) by mouth daily. 07/01/14  Yes Catarina Hartshorn, MD  gabapentin (NEURONTIN) 300 MG capsule Take 1 capsule (300 mg total) by mouth 2 (two) times daily. 06/30/14  Yes Catarina Hartshorn, MD  insulin aspart (NOVOLOG) 100 UNIT/ML FlexPen Inject 5 Units into the skin 4 (four) times daily.   Yes Historical Provider, MD  insulin glargine (LANTUS) 100 UNIT/ML injection Inject 20 Units into the skin daily with breakfast.    Yes Historical Provider, MD  metoprolol (LOPRESSOR) 50 MG tablet Take 50 mg by mouth 2 (two) times daily. 10/28/13  Yes Lennette Bihari, MD  MILK THISTLE PO Take 1 tablet by mouth daily with breakfast.   Yes Historical Provider, MD  Multiple Vitamin (MULTIVITAMIN WITH MINERALS) TABS tablet Take 1 tablet by mouth daily.   Yes Historical Provider, MD  Omega-3 Fatty Acids (FISH OIL PO) Take 1 capsule by mouth 2 (two) times daily.   Yes Historical Provider, MD  oxyCODONE-acetaminophen (PERCOCET/ROXICET) 5-325 MG per tablet Take 1-2 tablets by mouth See admin instructions. 2 tabs every morning, 1 tab in the afternoon and 2 tabs at bedtime 06/30/14  Yes Catarina Hartshorn, MD  potassium chloride (K-DUR,KLOR-CON) 10 MEQ tablet Take 1 tablet (10 mEq total) by mouth daily. 06/30/14  Yes Catarina Hartshorn, MD  predniSONE (DELTASONE) 10 MG tablet Take 20 mg by mouth daily with breakfast.   Yes Historical Provider, MD  Probiotic Product (PROBIOTIC DAILY) CAPS Take 1 capsule by mouth every evening.   Yes Historical Provider, MD  ferrous sulfate 325 (65 FE) MG tablet Take 1 tablet (325 mg total) by mouth daily with breakfast. 05/21/14   Maryann Mikhail, DO   predniSONE (DELTASONE) 20 MG tablet Take 2 tablets (40 mg total) by mouth daily with breakfast. Patient not taking: Reported on 07/01/2014 06/30/14   Catarina Hartshorn, MD    Physical Exam: Filed Vitals:   07/02/14 0030 07/02/14 0100 07/02/14 0200 07/02/14 0203  BP: 145/50 129/57  169/39  Pulse:  64 68 72  Temp:      TempSrc:      Resp: SpO2:  95% 81% 96%     General:  Moderately built and nourished.  Eyes: Anicteric no pallor.  ENT: No discharge from the ears eyes nose or mouth.  Neck: No mass felt.  Cardiovascular: S1-S2 heard.  Respiratory: No rhonchi or crepitations.  Abdomen: Soft nontender bowel sounds present.  Skin: No rash.  Musculoskeletal: No edema.  Psychiatric: Appears lethargic at this time but follows commands.  Neurologic: Lethargic and  drowsy but answers questions and follows commands and moves both upper extremity and patient is paraplegic.  Labs on Admission:  Basic Metabolic Panel:  Recent Labs Lab 06/27/14 0342 06/28/14 0331 06/29/14 0410 06/30/14 0415 07/01/14 2215  NA 142 142 142 143 142  K 4.1 4.2 3.7 3.6 4.8  CL 111 107 105 107 111  CO2 GLUCOSE 314* 279* 269* 96 243*  BUN 72* 81* 97* 98* 122*  CREATININE 1.97* 2.20* 2.50* 2.48* 3.11*  CALCIUM 9.1 9.5 9.1 8.7 8.6  MG  --   --  2.3 2.4  --   PHOS  --   --  4.2  --   --    Liver Function Tests:  Recent Labs Lab 07/01/14 2215  AST 13  ALT 33  ALKPHOS 69  BILITOT 1.0  PROT 7.0  ALBUMIN 3.1*    Recent Labs Lab 07/01/14 2215  LIPASE 22   No results for input(s): AMMONIA in the last 168 hours. CBC:  Recent Labs Lab 06/25/14 0502 06/26/14 0812 06/27/14 0342 06/29/14 0410 07/01/14 2102  WBC 9.9 11.8* 10.1 11.7* 29.8*  NEUTROABS  --   --   --   --  28.6*  HGB 7.9* 9.0* 9.0* 10.2* 11.6*  HCT 25.7* 28.8* 29.1* 32.2* 36.9*  MCV 92.8 91.1 89.8 90.2 92.0  PLT 220 257 259 340 299   Cardiac Enzymes: No results for input(s): CKTOTAL, CKMB,  CKMBINDEX, TROPONINI in the last 168 hours.  BNP (last 3 results)  Recent Labs  05/18/14 1410 06/20/14 1202  PROBNP 4183.0* 14250.0*   CBG:  Recent Labs Lab 06/29/14 1215 06/29/14 1654 06/29/14 2159 06/30/14 0747 06/30/14 0811  GLUCAP 367* 279* 175* 41* 70    Radiological Exams on Admission: Dg Chest 2 View  07/01/2014   CLINICAL DATA:  Altered mental status. Weakness. Patient not able to speak.  EXAM: CHEST  2 VIEW  COMPARISON:  06/29/2014  FINDINGS: Postoperative changes in the mediastinum. Low lung volumes with diffuse interstitial changes consistent with fibrosis. No superimposed airspace disease or effusion. No pneumothorax. Calcified and tortuous aorta. Heart size and pulmonary vascularity are normal. No change since previous study.  IMPRESSION: Diffuse pulmonary fibrosis with associated low lung volumes. No superimposed airspace disease.   Electronically Signed   By: Burman Nieves M.D.   On: 07/01/2014 22:17   Ct Head Wo Contrast  07/01/2014   CLINICAL DATA:  Weakness. Lethargy. Sleeping a lot and unable to wake. Confusion.  EXAM: CT HEAD WITHOUT CONTRAST  TECHNIQUE: Contiguous axial images were obtained from the base of the skull through the vertex without intravenous contrast.  COMPARISON:  06/20/2014  FINDINGS: Diffuse cerebral atrophy. Mild ventricular dilatation consistent with central atrophy. Patchy low-attenuation changes in the deep white matter consistent with small vessel ischemia. Extra-axial fluid collection in the middle cranial fossa consistent with arachnoid cyst. No change since prior study. No mass effect or midline shift. No abnormal extra-axial fluid collections. Gray-white matter junctions are distinct. Basal cisterns are not effaced. No evidence of acute intracranial hemorrhage. No depressed skull fractures. Partial opacification of the left frontal sinuses. Paranasal sinuses are otherwise clear. Mastoid air cells are not opacified. Extensive vascular  calcifications.  IMPRESSION: No acute intracranial abnormalities. Extra-axial cystic collection in the right middle cranial fossa. Chronic atrophy and small vessel ischemic changes. No change since previous study.   Electronically Signed   By: Burman Nieves M.D.   On: 07/01/2014 22:20    EKG:  Independently reviewed. Normal sinus rhythm with atrial premature complex and nonspecific ST changes.  Assessment/Plan Principal Problem:   Acute encephalopathy Active Problems:   Chronic atrial fibrillation   Leucocytosis   Renal failure (ARF), acute on chronic   Chronic anemia   Diabetes mellitus type 2, uncontrolled   Leukocytosis   1. Acute encephalopathy - cause not clear. Patient does have leukocytosis but patient is afebrile. UA does show pyuria for which patient has been empirically placed on antibiotics. Since patient has history of atrial fibrillation we will check MRI brain to rule out acute stroke and until then patient will be on neuro check and swallow evaluation. Ammonia levels are pending. 2. Leukocytosis - could be from recent addition of steroids for interstitial lung disease. But since it is significantly elevated patient has been empirically placed on antibiotics vancomycin cefepime and follow blood and urine cultures. 3. Pyuria with chronic indwelling catheter - follow urine cultures and then patient will be on antibiotics. 4. 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 5. Chronic atrial fibrillation presently rate controlled - patient's Apixaban was recently discontinued because of risk of falls. Check MRI brain due to #1. 6. Diabetes mellitus type 2 - closely follow CBGs and sliding scale coverage and home medications for now. 7. Hypertension - continue home medications. 8. History of thoracic artery aneurysm status post repair. 9. History of CAD status post CABG - denies any chest pain.  Addendum - I am holding patients Neurontin and  narcotics for now as they may also be contributing to the mental status changes. Restart them when clinically appropriate. Message conveyed to oncoming hospitalist Dr.Dhungel.   DVT Prophylaxis Lovenox.  Code Status: Full code.  Family Communication: Patient's wife.  Disposition Plan: Admit to inpatient.    Zalmen Wrightsman N. [facility] Pager [phone].  If 7PM-7AM, please contact night-coverage [email or web address] Password [email or web address] 07/02/2014, 2:56 AM

## 2014-07-03 DIAGNOSIS — N390 Urinary tract infection, site not specified: Secondary | ICD-10-CM | POA: Diagnosis present

## 2014-07-03 DIAGNOSIS — I959 Hypotension, unspecified: Secondary | ICD-10-CM | POA: Diagnosis present

## 2014-07-03 DIAGNOSIS — N139 Obstructive and reflux uropathy, unspecified: Secondary | ICD-10-CM

## 2014-07-03 DIAGNOSIS — T8351XD Infection and inflammatory reaction due to indwelling urinary catheter, subsequent encounter: Secondary | ICD-10-CM

## 2014-07-03 DIAGNOSIS — N189 Chronic kidney disease, unspecified: Secondary | ICD-10-CM

## 2014-07-03 DIAGNOSIS — N179 Acute kidney failure, unspecified: Principal | ICD-10-CM

## 2014-07-03 LAB — BASIC METABOLIC PANEL
Anion gap: 7 (ref 5–15)
BUN: 124 mg/dL — AB (ref 6–23)
CO2: 20 mmol/L (ref 19–32)
Calcium: 7.5 mg/dL — ABNORMAL LOW (ref 8.4–10.5)
Chloride: 110 mEq/L (ref 96–112)
Creatinine, Ser: 3.14 mg/dL — ABNORMAL HIGH (ref 0.50–1.35)
GFR calc non Af Amer: 17 mL/min — ABNORMAL LOW (ref >90)
GFR, EST AFRICAN AMERICAN: 20 mL/min — AB (ref >90)
GLUCOSE: 171 mg/dL — AB (ref 70–99)
Potassium: 4 mmol/L (ref 3.5–5.1)
SODIUM: 137 mmol/L (ref 135–145)

## 2014-07-03 LAB — CREATININE, URINE, RANDOM: Creatinine, Urine: 62.1 mg/dL

## 2014-07-03 LAB — CBC
HCT: 27 % — ABNORMAL LOW (ref 39.0–52.0)
Hemoglobin: 8.4 g/dL — ABNORMAL LOW (ref 13.0–17.0)
MCH: 28.8 pg (ref 26.0–34.0)
MCHC: 31.1 g/dL (ref 30.0–36.0)
MCV: 92.5 fL (ref 78.0–100.0)
Platelets: 257 10*3/uL (ref 150–400)
RBC: 2.92 MIL/uL — ABNORMAL LOW (ref 4.22–5.81)
RDW: 17.8 % — ABNORMAL HIGH (ref 11.5–15.5)
WBC: 13.9 10*3/uL — AB (ref 4.0–10.5)

## 2014-07-03 LAB — GLUCOSE, CAPILLARY
GLUCOSE-CAPILLARY: 187 mg/dL — AB (ref 70–99)
GLUCOSE-CAPILLARY: 190 mg/dL — AB (ref 70–99)
Glucose-Capillary: 283 mg/dL — ABNORMAL HIGH (ref 70–99)
Glucose-Capillary: 292 mg/dL — ABNORMAL HIGH (ref 70–99)

## 2014-07-03 LAB — SODIUM, URINE, RANDOM: Sodium, Ur: 36 mEq/L

## 2014-07-03 MED ORDER — OXYCODONE-ACETAMINOPHEN 5-325 MG PO TABS
1.0000 | ORAL_TABLET | Freq: Three times a day (TID) | ORAL | Status: DC | PRN
  Administered 2014-07-03 – 2014-07-04 (×3): 1 via ORAL
  Filled 2014-07-03 (×3): qty 1

## 2014-07-03 MED ORDER — GABAPENTIN 300 MG PO CAPS
300.0000 mg | ORAL_CAPSULE | Freq: Two times a day (BID) | ORAL | Status: DC
  Administered 2014-07-03 – 2014-07-05 (×5): 300 mg via ORAL
  Filled 2014-07-03 (×5): qty 1

## 2014-07-03 NOTE — Progress Notes (Signed)
[facility] PROGRESS NOTE  ARMIN YERGER RUE:[id number] DOB: 08-14-1928 DOA: 07/01/2014 PCP: [name], MD  Brief narrative 81 year old male with history of CAD status post CABG, A. 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 Workup done in ED showed mild hypercarbia on ABG, normal CT, UTI, and significant leukocytosis. Patient also had acute worsening of his kidney disease. Patient admitted to stepdown for closer monitoring. Further workup during the hospital stay showed malpositioning of the Foley catheter causing significant hydronephrosis.   Assessment/Plan: Acute encephalopathy Likely metabolic associated with uremia and underlying UTI . Head CT and MRI brain negative for stroke. -Continue with neuro checks. Continue empiric vancomycin and cefepime. . Follow urine culture and blood culture. -Avoid benzos or narcotics. -Mental status appears to be clearing . Spoke with his wife who thinks he is back to his baseline mental status now.   Acute on chronic kidney disease stage II-III -Possibly a combination of recent diuretic causing dehydration and obstructive uropathy.  -Malpositioned Foley seen on imaging and repositioned with good urine output. Renal function slowly improving. No uremic signs or symptoms this morning. -Placed on IV hydration and Lasix held. Appreciated renal recommendations.  Leukocytosis Secondary to both infection and steroid. Improving this morning   UTI  pyuria noted on exam and UA suggestive of UTI. Foley catheter was changed upon  admission. Continue empiric antibiotics and follow culture  Atrial fibrillation Rate controlled. Continue aspirin . Hold metoprolol due to low blood pressure.. Amiodarone discontinued during recent hospitalization. Not on Coumadin due to fall risk.  Type 2 diabetes mellitus Last A1c of 6.5. Elevated blood glucose secondary to infection and steroid. Lantus dose increased   Diastolic CHF Patient recently hospitalized for CHF exacerbation and diabetes with IV Lasix. Currently appears hypovolemic. Hold Lasix.   CAD with hx fo MI s/p CABG Continue aspirin,  and Zetia. Hold metoprolol due to low diastolic blood pressure   Hypotension Low diastolic blood pressure noted. Holding metoprolol  Iron def anemia Continue ferrous sulfate  Paraplegia with neurogenic bladder  will resume neurontin  Code Status: Full code Family Communication: None at bedside. Spoke with wife over the phone Disposition Plan: transfer to telemetry   Consultants:  Nephrology    Procedures:  MRI brain  Ultrasound abdomen  CT abdomen and pelvis  Antibiotics: IV vancomycin and cefepime   HPI/Subjective: Patient seen and examined. He is more alert and oriented today. Spoke with wife who feels he is  Back to baseline. Patient found to have urinary  retention with b/l hydronephrosis on imaging. Foley was malpositioned into the prostatic urethra. Foley adjusted and drained purulent urine. cx sent. Has good urine outpt now with  some improvement in renal fn.   Objective: Filed Vitals:   07/03/14 1000  BP: 121/19  Pulse: 58  Temp:   Resp: 15    Intake/Output Summary (Last 24 hours) at 07/03/14 1104 Last data filed at 07/03/14 1000  Gross per 24 hour  Intake 3502.5 ml  Output   2038 ml  Net 1464.5 ml   Filed Weights   07/02/14 0200  Weight: 67.5 kg (148 lb 13 oz)    Exam:   General:  Elderly male lying in bed , more alert and oriented   HEENT: No pallor, moist oral mucosa,   Chest:  Clear to auscultation bilaterally  CVS: S1 and S2 irregular, no murmurs rub or gallop  Abdomen: Soft, nondistended, nontender, chronic foley draining clear urine   Extremities: Warm, no edema  CNS:  awake and alert X2 paraplegic  Lab data Reviewed: Basic Metabolic Panel:  Recent Labs Lab 06/29/14 0410 06/30/14 0415 07/01/14 2215 07/02/14 0418 07/03/14 0408  NA 142 143 142 138 137  K 3.7 3.6 4.8 4.6 4.0  CL 105 107 111 107 110  CO2 GLUCOSE 269* 96 243* 286* 171*  BUN 97* 98* 122* 129* 124*  CREATININE 2.50* 2.48* 3.11* 3.39* 3.14*  CALCIUM 9.1 8.7 8.6 8.3* 7.5*  MG 2.3 2.4  --   --   --   PHOS 4.2  --   --   --   --    Liver Function Tests:  Recent Labs Lab 07/01/14 2215 07/02/14 0418  AST 13 13  ALT 33 31  ALKPHOS 69 66  BILITOT 1.0 0.9  PROT 7.0 6.6  ALBUMIN 3.1* 2.8*    Recent Labs Lab 07/01/14 2215  LIPASE 22    Recent Labs Lab 07/02/14 0129  AMMONIA <9*   CBC:  Recent Labs Lab 06/27/14 0342 06/29/14 0410 07/01/14 2102 07/02/14 0418 07/03/14 0408  WBC 10.1 11.7* 29.8* 24.4* 13.9*  NEUTROABS  --   --  28.6* 23.4*  --   HGB 9.0* 10.2* 11.6* 10.8* 8.4*  HCT 29.1* 32.2* 36.9* 35.0* 27.0*  MCV 89.8 90.2 92.0 91.6 92.5  PLT 259 340 299 288 257   Cardiac Enzymes:  Recent Labs Lab 07/02/14 0330  TROPONINI 0.04*   BNP (last 3 results)  Recent Labs  05/18/14 1410 06/20/14 1202  PROBNP 4183.0* 14250.0*   CBG:  Recent Labs Lab 06/30/14 0811 07/02/14 0810 07/02/14 1159 07/02/14 1614 07/02/14 2127  GLUCAP 70 289* 289* 145* 72    Recent Results (from the past 240 hour(s))  MRSA PCR Screening     Status: None   Collection Time: 06/26/14  6:53 AM  Result Value Ref Range Status   MRSA by PCR NEGATIVE NEGATIVE Final    Comment:        The GeneXpert MRSA Assay (FDA approved for NASAL specimens only), is one component of a comprehensive MRSA colonization surveillance program. It is not intended to diagnose  MRSA infection nor to guide or monitor treatment for MRSA infections.   Urine culture     Status: None   Collection Time: 06/26/14  6:23 PM  Result Value Ref Range Status   Specimen Description URINE, CATHETERIZED  Final   Special Requests NONE  Final   Colony Count NO GROWTH Performed at [facility]   Final   Culture NO GROWTH Performed at [facility]   Final   Report Status 06/28/2014 FINAL  Final     Studies: Ct Abdomen Pelvis Wo Contrast  07/02/2014   ADDENDUM REPORT: 07/02/2014 18:25  ADDENDUM: I discussed the Foley catheter placement with Dr. Gonzella Lex at 1824 hours on 07/02/2013.   Electronically Signed   By: Kennith Center M.D.   On: 07/02/2014 18:25   07/02/2014   CLINICAL DATA:  Initial encounter for hydronephrosis.  EXAM: CT ABDOMEN AND PELVIS WITHOUT CONTRAST  TECHNIQUE: Multidetector CT imaging of the abdomen and pelvis was performed following the standard protocol without IV contrast.  COMPARISON:  06/20/2014  FINDINGS: Lower chest: Chronic interstitial lung disease superimposed on emphysema. There is peripheral honeycombing compatible with UIP.  Hepatobiliary: No focal abnormality in the liver on this study without intravenous contrast. No evidence for hepatomegaly. Gallbladder is distended with some probable layering sludge. No intrahepatic or extrahepatic biliary dilation.  Pancreas: Diffuse pancreatic atrophy noted with diffuse distention of the main pancreatic duct. These findings are associated with the innumerable pancreatic calcifications throughout the parenchyma. Together imaging features are compatible with a history of chronic pancreatitis.  Spleen: No splenomegaly. No focal mass lesion.  Adrenals/Urinary Tract: No adrenal nodule or mass. Since the prior study, the patient has developed mild to moderate bilateral hydroureteronephrosis. Right renal cyst again noted. Nonobstructing left-sided renal stone is again evident. The ureters remain dilated into the  pelvis where the urinary bladder is markedly distended. There is a gas bubble in the bladder likely secondary to instrumentation. Of particular note, the patient's Foley balloon is inflated within the lower prostatic urethra.  Stomach/Bowel: Stomach is nondistended. No gastric wall thickening. No evidence of outlet obstruction. Duodenum is normally positioned as is the ligament of Treitz. No small bowel wall thickening. No small bowel dilatation. Terminal ileum is normal. Appendix is normal. Colon is unremarkable.  Vascular/Lymphatic: Atherosclerotic calcification is noted in the wall of the abdominal aorta without aneurysm. The aorta the level of the diaphragmatic hiatus measures 4 cm and thoracic segments of the aorta were better evaluated on the previous CT scan.  Reproductive: Calcification is seen in the prostate gland. Seminal vesicles are unremarkable.  Other: No intraperitoneal free fluid. There is some edema in the soft tissues of the pelvic floor.  Musculoskeletal: Patient is status post lower lumbar fusion.  IMPRESSION: 1. 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 This may be secondary to a idiopathic pulmonary fibrosis. 2. Changes consistent with chronic pancreatitis. 3. Bilateral hydroureteronephrosis with bladder distension. This is compatible with the patient's Foley balloon being inflated in the lower prostatic urethra. 4. Other incidental findings as above.  Electronically Signed: By: Kennith Center M.D. On: 07/02/2014 18:18   Dg Chest 2 View  07/01/2014   CLINICAL DATA:  Altered mental status. Weakness. Patient not able to speak.  EXAM: CHEST  2 VIEW  COMPARISON:  06/29/2014  FINDINGS: Postoperative changes in the mediastinum. Low lung volumes with diffuse interstitial changes consistent with fibrosis. No superimposed airspace disease or effusion. No pneumothorax. Calcified and tortuous aorta. Heart size and pulmonary vascularity are normal. No change since previous study.  IMPRESSION: Diffuse pulmonary  fibrosis with associated low lung volumes. No superimposed airspace disease.   Electronically Signed   By: Burman Nieves M.D.   On: 07/01/2014 22:17   Ct Head Wo Contrast  07/01/2014   CLINICAL DATA:  Weakness. Lethargy. Sleeping a lot and unable to wake. Confusion.  EXAM: CT HEAD WITHOUT CONTRAST  TECHNIQUE: Contiguous axial images were obtained from the base of the skull through the vertex without intravenous contrast.  COMPARISON:  06/20/2014  FINDINGS: Diffuse cerebral atrophy. Mild ventricular dilatation consistent with central atrophy. Patchy low-attenuation changes in the deep white matter consistent with small vessel ischemia. Extra-axial fluid collection in the middle cranial fossa consistent with arachnoid cyst. No change since prior study. No mass effect or midline shift. No abnormal extra-axial fluid collections. Gray-white matter junctions are distinct. Basal cisterns are not effaced. No evidence of acute intracranial hemorrhage. No depressed skull fractures. Partial opacification of the left frontal sinuses. Paranasal sinuses are otherwise clear. Mastoid  air cells are not opacified. Extensive vascular calcifications.  IMPRESSION: No acute intracranial abnormalities. Extra-axial cystic collection in the right middle cranial fossa. Chronic atrophy and small vessel ischemic changes. No change since previous study.   Electronically Signed   By: Burman Nieves M.D.   On: 07/01/2014 22:20   Mr Brain Wo Contrast  07/02/2014   CLINICAL DATA:  Acute encephalopathy. Altered mental status. Lethargic yesterday but became unresponsive this morning.  EXAM: MRI HEAD WITHOUT CONTRAST  TECHNIQUE: Multiplanar, multiecho pulse sequences of the brain and surrounding structures were obtained without intravenous contrast.  COMPARISON:  Head CT 07/01/2013  FINDINGS: Diffusion imaging does not show any acute or subacute infarction. There are minimal chronic small-vessel changes of the pons. No cerebellar insult. The  cerebral hemispheres show mild age related atrophy with minimal small vessel change of the deep white matter. There is a chronic arachnoid cyst in the middle cranial fossa anteriorly on the right, unchanged since previous studies. No mass lesion, hemorrhage, hydrocephalus or extra-axial/subdural hematoma. No pituitary mass. No inflammatory sinus disease. No skull or skullbase lesion.  IMPRESSION: No acute or reversible finding.  Mild age related atrophy and minimal small vessel change.  Chronic insignificant right middle cranial fossa arachnoid cyst.   Electronically Signed   By: Paulina Fusi M.D.   On: 07/02/2014 11:34   US Renal  07/02/2014   CLINICAL DATA:  Acute kidney injury  EXAM: RENAL/URINARY TRACT ULTRASOUND COMPLETE  COMPARISON:  CT of 06/20/2014  FINDINGS: Right Kidney:  Length: 10.1 cm. Mildly increased in echogenicity. Mild to moderate hydroureteronephrosis. Upper pole 2.9 cm cyst or minimally complex cyst.  Left Kidney:  Length: 11.2 cm. Mildly increased echogenicity. Moderate left-sided hydroureteronephrosis. Suspect a stone within the region of the left ureteropelvic junction. 1.4 cm on image 39.  Bladder:  Bladder distension with debris within. Foley catheter identified, possibly malpositioned. Example image 75. Bladder wall thickening, 6 mm.  IMPRESSION: 1. Left greater than right hydronephrosis. There may be a stone within the dilated left ureteropelvic junction. This may be due to the bladder process. 2. Increased renal echogenicity, suggesting medical renal disease. 3. Bladder distension with debris within and wall thickening. Suspicious for cystitis. 4. Suspicion of malpositioned Foley catheter. Suboptimally evaluated. Consider further evaluation with abdominal pelvic CT. These results were called by telephone at the time of interpretation on 07/02/2014 at 11:54 am to [facility], Who verbally acknowledged these results.   Electronically Signed   By: Jeronimo Greaves M.D.   On: 07/02/2014 11:56     Scheduled Meds: . aspirin  300 mg Rectal Daily   Or  . aspirin  325 mg Oral Daily  . ceFEPime (MAXIPIME) IV  1 g Intravenous Q24H  . docusate sodium  100 mg Oral BID  . ezetimibe  10 mg Oral Daily  . ferrous sulfate  325 mg Oral Q breakfast  . heparin subcutaneous  5,000 Units Subcutaneous 3 times per day  . insulin aspart  0-15 Units Subcutaneous TID WC  . insulin aspart  0-5 Units Subcutaneous QHS  . insulin glargine  26 Units Subcutaneous Q breakfast  . metoprolol  12.5 mg Oral BID  . omega-3 acid ethyl esters  1 g Oral Daily  . predniSONE  40 mg Oral Q breakfast  . vancomycin  1,000 mg Intravenous Q48H   Continuous Infusions: . sodium chloride 10 mL/hr at 07/02/14 0328  . sodium chloride 100 mL/hr at 07/03/14 1610      Time spent: 35 minutes  Eddie North  [facility] Pager [phone]. If 7PM-7AM, please contact night-coverage at [email or web address], password [facility] 07/03/2014, 11:04 AM  LOS: 2 days

## 2014-07-04 DIAGNOSIS — R739 Hyperglycemia, unspecified: Secondary | ICD-10-CM

## 2014-07-04 DIAGNOSIS — N3000 Acute cystitis without hematuria: Secondary | ICD-10-CM

## 2014-07-04 LAB — CBC
HCT: 29 % — ABNORMAL LOW (ref 39.0–52.0)
Hemoglobin: 8.8 g/dL — ABNORMAL LOW (ref 13.0–17.0)
MCH: 28.6 pg (ref 26.0–34.0)
MCHC: 30.3 g/dL (ref 30.0–36.0)
MCV: 94.2 fL (ref 78.0–100.0)
Platelets: 242 10*3/uL (ref 150–400)
RBC: 3.08 MIL/uL — AB (ref 4.22–5.81)
RDW: 17.5 % — ABNORMAL HIGH (ref 11.5–15.5)
WBC: 9.2 10*3/uL (ref 4.0–10.5)

## 2014-07-04 LAB — BASIC METABOLIC PANEL
Anion gap: 6 (ref 5–15)
BUN: 103 mg/dL — AB (ref 6–23)
CHLORIDE: 110 meq/L (ref 96–112)
CO2: 22 mmol/L (ref 19–32)
Calcium: 8 mg/dL — ABNORMAL LOW (ref 8.4–10.5)
Creatinine, Ser: 2.58 mg/dL — ABNORMAL HIGH (ref 0.50–1.35)
GFR calc non Af Amer: 21 mL/min — ABNORMAL LOW (ref >90)
GFR, EST AFRICAN AMERICAN: 25 mL/min — AB (ref >90)
Glucose, Bld: 339 mg/dL — ABNORMAL HIGH (ref 70–99)
POTASSIUM: 4.8 mmol/L (ref 3.5–5.1)
SODIUM: 138 mmol/L (ref 135–145)

## 2014-07-04 LAB — GLUCOSE, CAPILLARY
GLUCOSE-CAPILLARY: 303 mg/dL — AB (ref 70–99)
GLUCOSE-CAPILLARY: 288 mg/dL — AB (ref 70–99)
Glucose-Capillary: 201 mg/dL — ABNORMAL HIGH (ref 70–99)
Glucose-Capillary: 305 mg/dL — ABNORMAL HIGH (ref 70–99)

## 2014-07-04 LAB — URINE CULTURE
Colony Count: >=100000
Special Requests: NORMAL

## 2014-07-04 MED ORDER — INSULIN ASPART 100 UNIT/ML ~~LOC~~ SOLN
3.0000 [IU] | Freq: Three times a day (TID) | SUBCUTANEOUS | Status: DC
  Administered 2014-07-04 – 2014-07-05 (×3): 3 [IU] via SUBCUTANEOUS

## 2014-07-04 MED ORDER — MORPHINE SULFATE 2 MG/ML IJ SOLN
0.5000 mg | Freq: Once | INTRAMUSCULAR | Status: AC
  Administered 2014-07-05: 0.5 mg via INTRAVENOUS
  Filled 2014-07-04 (×2): qty 1

## 2014-07-04 MED ORDER — OXYCODONE-ACETAMINOPHEN 5-325 MG PO TABS
2.0000 | ORAL_TABLET | Freq: Two times a day (BID) | ORAL | Status: DC
  Administered 2014-07-04 – 2014-07-05 (×2): 2 via ORAL
  Filled 2014-07-04 (×2): qty 2

## 2014-07-04 MED ORDER — INSULIN GLARGINE 100 UNIT/ML ~~LOC~~ SOLN
30.0000 [IU] | Freq: Every day | SUBCUTANEOUS | Status: DC
  Administered 2014-07-05: 30 [IU] via SUBCUTANEOUS
  Filled 2014-07-04: qty 0.3

## 2014-07-04 MED ORDER — OXYCODONE-ACETAMINOPHEN 5-325 MG PO TABS
1.0000 | ORAL_TABLET | Freq: Every day | ORAL | Status: DC
  Administered 2014-07-05: 1 via ORAL
  Filled 2014-07-04: qty 1

## 2014-07-04 MED ORDER — OXYCODONE-ACETAMINOPHEN 5-325 MG PO TABS: 2.0000 | ORAL_TABLET | Freq: Two times a day (BID) | ORAL | Status: DC

## 2014-07-04 NOTE — Progress Notes (Signed)
S: Appetite still poor O:BP 118/35 mmHg  Pulse 49  Temp(Src) 97.6 F (36.4 C) (Oral)  Resp 18  Ht 6' (1.829 m)  Wt 67.5 kg (148 lb 13 oz)  BMI 20.18 kg/m2  SpO2 100%  Intake/Output Summary (Last 24 hours) at 07/04/14 1156 Last data filed at 07/04/14 0514  Gross per 24 hour  Intake    440 ml  Output   1645 ml  Net  -1205 ml   Weight change:  ZOX:WRUEA and alert CVS:RRR Resp:decreased BS bases with few crackles Abd:+ BS NTND Ext:no edema NEURO:OX3 no asterixis   . aspirin  300 mg Rectal Daily   Or  . aspirin  325 mg Oral Daily  . ceFEPime (MAXIPIME) IV  1 g Intravenous Q24H  . docusate sodium  100 mg Oral BID  . ezetimibe  10 mg Oral Daily  . ferrous sulfate  325 mg Oral Q breakfast  . gabapentin  300 mg Oral BID  . heparin subcutaneous  5,000 Units Subcutaneous 3 times per day  . insulin aspart  0-15 Units Subcutaneous TID WC  . insulin aspart  0-5 Units Subcutaneous QHS  . insulin glargine  26 Units Subcutaneous Q breakfast  . omega-3 acid ethyl esters  1 g Oral Daily  . predniSONE  40 mg Oral Q breakfast  . vancomycin  1,000 mg Intravenous Q48H   Ct Abdomen Pelvis Wo Contrast  07/02/2014   ADDENDUM REPORT: 07/02/2014 18:25  ADDENDUM: I discussed the Foley catheter placement with Dr. Gonzella Lex at 1824 hours on 07/02/2013.   Electronically Signed   By: Kennith Center M.D.   On: 07/02/2014 18:25   07/02/2014   CLINICAL DATA:  Initial encounter for hydronephrosis.  EXAM: CT ABDOMEN AND PELVIS WITHOUT CONTRAST  TECHNIQUE: Multidetector CT imaging of the abdomen and pelvis was performed following the standard protocol without IV contrast.  COMPARISON:  06/20/2014  FINDINGS: Lower chest: Chronic interstitial lung disease superimposed on emphysema. There is peripheral honeycombing compatible with UIP.  Hepatobiliary: No focal abnormality in the liver on this study without intravenous contrast. No evidence for hepatomegaly. Gallbladder is distended with some probable layering sludge.  No intrahepatic or extrahepatic biliary dilation.  Pancreas: Diffuse pancreatic atrophy noted with diffuse distention of the main pancreatic duct. These findings are associated with the innumerable pancreatic calcifications throughout the parenchyma. Together imaging features are compatible with a history of chronic pancreatitis.  Spleen: No splenomegaly. No focal mass lesion.  Adrenals/Urinary Tract: No adrenal nodule or mass. Since the prior study, the patient has developed mild to moderate bilateral hydroureteronephrosis. Right renal cyst again noted. Nonobstructing left-sided renal stone is again evident. The ureters remain dilated into the pelvis where the urinary bladder is markedly distended. There is a gas bubble in the bladder likely secondary to instrumentation. Of particular note, the patient's Foley balloon is inflated within the lower prostatic urethra.  Stomach/Bowel: Stomach is nondistended. No gastric wall thickening. No evidence of outlet obstruction. Duodenum is normally positioned as is the ligament of Treitz. No small bowel wall thickening. No small bowel dilatation. Terminal ileum is normal. Appendix is normal. Colon is unremarkable.  Vascular/Lymphatic: Atherosclerotic calcification is noted in the wall of the abdominal aorta without aneurysm. The aorta the level of the diaphragmatic hiatus measures 4 cm and thoracic segments of the aorta were better evaluated on the previous CT scan.  Reproductive: Calcification is seen in the prostate gland. Seminal vesicles are unremarkable.  Other: No intraperitoneal free fluid. There is some edema  in the soft tissues of the pelvic floor.  Musculoskeletal: Patient is status post lower lumbar fusion.  IMPRESSION: 1. 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 This may be secondary to a idiopathic pulmonary fibrosis. 2. Changes consistent with chronic pancreatitis. 3. Bilateral hydroureteronephrosis with bladder distension. This is compatible with the patient's Foley  balloon being inflated in the lower prostatic urethra. 4. Other incidental findings as above.  Electronically Signed: By: Kennith Center M.D. On: 07/02/2014 18:18   BMET    Component Value Date/Time   NA 138 07/04/2014 0438   K 4.8 07/04/2014 0438   CL 110 07/04/2014 0438   CO2 22 07/04/2014 0438   GLUCOSE 339* 07/04/2014 0438   BUN 103* 07/04/2014 0438   CREATININE 2.58* 07/04/2014 0438   CREATININE 1.61* 01/21/2014 1146   CALCIUM 8.0* 07/04/2014 0438   GFRNONAA 21* 07/04/2014 0438   GFRAA 25* 07/04/2014 0438   CBC    Component Value Date/Time   WBC 9.2 07/04/2014 0438   RBC 3.08* 07/04/2014 0438   RBC 3.07* 05/19/2014 0802   HGB 8.8* 07/04/2014 0438   HCT 29.0* 07/04/2014 0438   PLT 242 07/04/2014 0438   MCV 94.2 07/04/2014 0438   MCH 28.6 07/04/2014 0438   MCHC 30.3 07/04/2014 0438   RDW 17.5* 07/04/2014 0438   LYMPHSABS 0.5* 07/02/2014 0418   MONOABS 0.5 07/02/2014 0418   EOSABS 0.0 07/02/2014 0418   BASOSABS 0.0 07/02/2014 0418     Assessment: 1. Acute on CKD 3 sec outlet obstruction. Scr and UO improved with proper foley placement  Plan: 1. Anticipate renal fx to cont to improve to baseline.  Will sign off, call if further renal issues   Asahel Risden T

## 2014-07-04 NOTE — Progress Notes (Addendum)
[facility] PROGRESS NOTE  [name] [name] ZOX:[id number] DOB: May 29, 1929 DOA: 07/01/2014 PCP: [name], MD  Brief narrative 81 year old male with history of CAD status post CABG, A. 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 Workup done in ED showed mild hypercarbia on ABG, normal CT, UTI, and significant leukocytosis. Patient also had acute worsening of his kidney disease. Patient admitted to stepdown for closer monitoring. Further workup during the hospital stay showed malpositioning of the Foley catheter causing significant hydronephrosis.   Assessment/Plan: Acute encephalopathy Likely metabolic associated with uremia and underlying UTI . Head CT and MRI brain negative for stroke. -Continue with neuro checks. Continue empiric vancomycin and cefepime for today. Will narrow based on sensitivity. .  Follow urine culture and blood culture. -Avoid benzos or narcotics. -Mental status back to baseline.   Acute on chronic kidney disease stage II-III -Possibly a combination of recent diuretic causing dehydration and obstructive uropathy from mispositioned foley.  -Malpositioned Foley seen on imaging and repositioned with good urine output. Renal function slowly improving. No uremic signs or symptoms this morning. -Placed on IV hydration and Lasix held. Appreciated renal recommendations. Off fluids now.    Leukocytosis Secondary to both infection and steroids. Resolved.  UTI  pyuria noted on exam and UA suggestive of UTI. Foley catheter was changed upon admission.  Continue empiric antibiotics and follow culture  Atrial fibrillation Rate controlled. Continue aspirin . Hold metoprolol due to low blood pressure.. Amiodarone discontinued during recent hospitalization. Not on Coumadin due to fall risk.  Type 2 diabetes mellitus Last A1c of 6.5. Elevated blood glucose secondary to infection and steroid. Lantus dose increased yesterday. fsg still in 300s. will increase further and add meal coverage.   Diastolic CHF Patient recently hospitalized for CHF exacerbation and diabetes with IV Lasix. Currently appears hypovolemic. Hold Lasix.   CAD with hx fo MI s/p CABG Continue aspirin,  and Zetia. Holding metoprolol due to low diastolic blood pressure   Hypotension Low diastolic blood pressure noted. Holding metoprolol  Iron def anemia Continue ferrous sulfate  Paraplegia with neurogenic bladder resumed neurontin  Code Status: Full code Family Communication: None at bedside. Spoke with wife over the phone on 1/3 Disposition Plan: SNF possibly tmorrow   Consultants:  Nephrology    Procedures:  MRI brain  Ultrasound abdomen  CT abdomen and pelvis  Antibiotics: IV vancomycin and cefepime   HPI/Subjective: Patient seen and examined.mental status at baseline. No overnight issues  Objective: Filed Vitals:   07/04/14 0511  BP: 118/35  Pulse: 49  Temp: 97.6 F (36.4 C)  Resp: 18    Intake/Output Summary (Last 24 hours) at 07/04/14 1258 Last data filed at 07/04/14 0514  Gross per 24 hour  Intake    240 ml  Output   1400 ml  Net  -1160 ml   Filed Weights   07/02/14 0200  Weight: 67.5 kg (148 lb 13 oz)    Exam:   General:  Elderly male lying in bed n NAD  HEENT:  moist oral mucosa,   Chest: Clear to auscultation bilaterally  CVS: S1 and S2 irregular, no murmurs rub or gallop  Abdomen: Soft, nondistended, nontender, chronic  foley draining clear urine   Extremities: Warm, no edema  CNS:  awake and alert X3,  paraplegic  Lab data Reviewed: Basic Metabolic Panel:  Recent Labs Lab 06/29/14 0410 06/30/14 0415 07/01/14 2215 07/02/14 0418 07/03/14 0408 07/04/14 0438  NA 142 143 142 138 137 138  K 3.7 3.6 4.8 4.6 4.0 4.8  CL 105 107 111 107 110 110  CO2 GLUCOSE 269* 96 243* 286* 171* 339*  BUN 97* 98* 122* 129* 124* 103*  CREATININE 2.50* 2.48* 3.11* 3.39* 3.14* 2.58*  CALCIUM 9.1 8.7 8.6 8.3* 7.5* 8.0*  MG 2.3 2.4  --   --   --   --   PHOS 4.2  --   --   --   --   --    Liver Function Tests:  Recent Labs Lab 07/01/14 2215 07/02/14 0418  AST 13 13  ALT 33 31  ALKPHOS 69 66  BILITOT 1.0 0.9  PROT 7.0 6.6  ALBUMIN 3.1* 2.8*    Recent Labs Lab 07/01/14 2215  LIPASE 22    Recent Labs Lab 07/02/14 0129  AMMONIA <9*   CBC:  Recent Labs Lab 06/29/14 0410 07/01/14 2102 07/02/14 0418 07/03/14 0408 07/04/14 0438  WBC 11.7* 29.8* 24.4* 13.9* 9.2  NEUTROABS  --  28.6* 23.4*  --   --   HGB 10.2* 11.6* 10.8* 8.4* 8.8*  HCT 32.2* 36.9* 35.0* 27.0* 29.0*  MCV 90.2 92.0 91.6 92.5 94.2  PLT 340 299 288 257 242   Cardiac Enzymes:  Recent Labs Lab 07/02/14 0330  TROPONINI 0.04*   BNP (last 3 results)  Recent Labs  05/18/14 1410 06/20/14 1202  PROBNP 4183.0* 14250.0*   CBG:  Recent Labs Lab 07/03/14 1215 07/03/14 1658 07/03/14 2214 07/04/14 0752 07/04/14 1130  GLUCAP 283* 190* 292* 305* 303*    Recent Results (from the past 240 hour(s))  MRSA PCR Screening     Status: None   Collection Time: 06/26/14  6:53 AM  Result Value Ref Range Status   MRSA by PCR NEGATIVE NEGATIVE Final    Comment:        The GeneXpert MRSA Assay (FDA approved for NASAL specimens only), is one component of a comprehensive MRSA colonization surveillance program. It is not intended to diagnose MRSA infection nor to guide or monitor treatment for MRSA infections.   Urine culture     Status: None   Collection Time: 06/26/14  6:23 PM  Result Value Ref  Range Status   Specimen Description URINE, CATHETERIZED  Final   Special Requests NONE  Final   Colony Count NO GROWTH Performed at [facility]   Final   Culture NO GROWTH Performed at [facility]   Final   Report Status 06/28/2014 FINAL  Final  Urine culture     Status: None   Collection Time: 07/01/14 10:42 PM  Result Value Ref Range Status   Specimen Description URINE, RANDOM  Final   Special Requests Normal  Final   Colony Count   Final    >=100,000 COLONIES/ML Performed at [facility]    Culture   Final    Multiple bacterial morphotypes present, none predominant. Suggest appropriate recollection if clinically indicated. Performed at [facility]    Report Status 07/04/2014 FINAL  Final  Blood culture (routine x 2)     Status: None (Preliminary result)   Collection Time: 07/01/14 11:26 PM  Result Value Ref Range  Status   Specimen Description BLOOD LEFT ARM  Final   Special Requests BOTTLES DRAWN AEROBIC AND ANAEROBIC 5CC  Final   Culture   Final           BLOOD CULTURE RECEIVED NO GROWTH TO DATE CULTURE WILL BE HELD FOR 5 DAYS BEFORE ISSUING A FINAL NEGATIVE REPORT Performed at [facility]    Report Status PENDING  Incomplete  Blood culture (routine x 2)     Status: None (Preliminary result)   Collection Time: 07/01/14 11:30 PM  Result Value Ref Range Status   Specimen Description BLOOD RIGHT ARM  Final   Special Requests BOTTLES DRAWN AEROBIC AND ANAEROBIC 5CC  Final   Culture   Final           BLOOD CULTURE RECEIVED NO GROWTH TO DATE CULTURE WILL BE HELD FOR 5 DAYS BEFORE ISSUING A FINAL NEGATIVE REPORT Performed at [facility]    Report Status PENDING  Incomplete     Studies: Ct Abdomen Pelvis Wo Contrast  07/02/2014   ADDENDUM REPORT: 07/02/2014 18:25  ADDENDUM: I discussed the Foley catheter placement with Dr. Gonzella Lex at 1824 hours on 07/02/2013.   Electronically Signed   By: Kennith Center M.D.   On:  07/02/2014 18:25   07/02/2014   CLINICAL DATA:  Initial encounter for hydronephrosis.  EXAM: CT ABDOMEN AND PELVIS WITHOUT CONTRAST  TECHNIQUE: Multidetector CT imaging of the abdomen and pelvis was performed following the standard protocol without IV contrast.  COMPARISON:  06/20/2014  FINDINGS: Lower chest: Chronic interstitial lung disease superimposed on emphysema. There is peripheral honeycombing compatible with UIP.  Hepatobiliary: No focal abnormality in the liver on this study without intravenous contrast. No evidence for hepatomegaly. Gallbladder is distended with some probable layering sludge. No intrahepatic or extrahepatic biliary dilation.  Pancreas: Diffuse pancreatic atrophy noted with diffuse distention of the main pancreatic duct. These findings are associated with the innumerable pancreatic calcifications throughout the parenchyma. Together imaging features are compatible with a history of chronic pancreatitis.  Spleen: No splenomegaly. No focal mass lesion.  Adrenals/Urinary Tract: No adrenal nodule or mass. Since the prior study, the patient has developed mild to moderate bilateral hydroureteronephrosis. Right renal cyst again noted. Nonobstructing left-sided renal stone is again evident. The ureters remain dilated into the pelvis where the urinary bladder is markedly distended. There is a gas bubble in the bladder likely secondary to instrumentation. Of particular note, the patient's Foley balloon is inflated within the lower prostatic urethra.  Stomach/Bowel: Stomach is nondistended. No gastric wall thickening. No evidence of outlet obstruction. Duodenum is normally positioned as is the ligament of Treitz. No small bowel wall thickening. No small bowel dilatation. Terminal ileum is normal. Appendix is normal. Colon is unremarkable.  Vascular/Lymphatic: Atherosclerotic calcification is noted in the wall of the abdominal aorta without aneurysm. The aorta the level of the diaphragmatic hiatus  measures 4 cm and thoracic segments of the aorta were better evaluated on the previous CT scan.  Reproductive: Calcification is seen in the prostate gland. Seminal vesicles are unremarkable.  Other: No intraperitoneal free fluid. There is some edema in the soft tissues of the pelvic floor.  Musculoskeletal: Patient is status post lower lumbar fusion.  IMPRESSION: 1. 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 This may be secondary to a idiopathic pulmonary fibrosis. 2. Changes consistent with chronic pancreatitis. 3. Bilateral hydroureteronephrosis with bladder distension. This is compatible with the patient's Foley balloon being inflated in the lower prostatic urethra.  4. Other incidental findings as above.  Electronically Signed: By: Kennith Center M.D. On: 07/02/2014 18:18    Scheduled Meds: . aspirin  300 mg Rectal Daily   Or  . aspirin  325 mg Oral Daily  . ceFEPime (MAXIPIME) IV  1 g Intravenous Q24H  . docusate sodium  100 mg Oral BID  . ezetimibe  10 mg Oral Daily  . ferrous sulfate  325 mg Oral Q breakfast  . gabapentin  300 mg Oral BID  . heparin subcutaneous  5,000 Units Subcutaneous 3 times per day  . insulin aspart  0-15 Units Subcutaneous TID WC  . insulin aspart  0-5 Units Subcutaneous QHS  . insulin glargine  26 Units Subcutaneous Q breakfast  . omega-3 acid ethyl esters  1 g Oral Daily  . predniSONE  40 mg Oral Q breakfast  . vancomycin  1,000 mg Intravenous Q48H   Continuous Infusions: . sodium chloride 10 mL/hr at 07/02/14 0328      Time spent: 35 minutes    Murial Beam  [facility] Pager [phone]. If 7PM-7AM, please contact night-coverage at [email or web address], password [facility] 07/04/2014, 12:58 PM  LOS: 3 days

## 2014-07-04 NOTE — Progress Notes (Signed)
PT Cancellation Note  Patient Details Name: [name] [name] MRN: [id number] DOB: [birth year]   Cancelled Treatment:    Reason Eval/Treat Not Completed: Patient declined, no reason specified (pt refuses OOB at this time, states he just wants to sleep) Per CSW, pt to d/c back to SNF, possibly today.  Will check back as schedule permits, if pt remains in acute care.   Aarron Wierzbicki,KATHrine E 07/04/2014, 9:46 AM  Zenovia Jarred, PT, DPT 07/04/2014 Pager: [phone]

## 2014-07-04 NOTE — Progress Notes (Signed)
Clinical Social Work Department BRIEF PSYCHOSOCIAL ASSESSMENT 07/04/2014  Patient:  [name] [name], [name] [name]     Account Number:  [id number]     Admit date:  07/01/2014  Clinical Social Worker:  Orpah Greek  Date/Time:  07/04/2014 04:09 PM  Referred by:  Physician  Date Referred:  07/04/2014 Referred for  SNF Placement   Other Referral:   Interview type:  Family Other interview type:   patient's wife, Clydie Braun via phone    PSYCHOSOCIAL DATA Living Status:  FACILITY Admitted from facility:  [facility] Level of care:  Skilled Nursing Facility Primary support name:  Alfred Harrel (wife) h#: [phone] Primary support relationship to patient:  SPOUSE Degree of support available:   good    CURRENT CONCERNS Current Concerns  Post-Acute Placement   Other Concerns:    SOCIAL WORK ASSESSMENT / PLAN CSW received consult that patient was admitted from SNF.   Assessment/plan status:  Information/Referral to [facility] Other assessment/ plan:   Information/referral to community resources:   CSW completed FL2 and faxed information out to [facility] - provided bed offers.    PATIENT'S/FAMILY'S RESPONSE TO PLAN OF CARE: CSW is familiar to patient/wife as he was just discharged to [facility] last week, 06/29/14 and was readmitted 07/01/14. Wife informed CSW that she would like for him to go to another SNF at discharge, requested [facility]. CSW confirmed with Wille Celeste @ [facility] that they will have a bed available for patient at discharge.         Lincoln Maxin, LCSW [facility] Clinical Social Worker cell #: [phone]

## 2014-07-04 NOTE — Telephone Encounter (Signed)
OK 

## 2014-07-04 NOTE — Progress Notes (Signed)
Clinical Social Work Department CLINICAL SOCIAL WORK PLACEMENT NOTE 07/04/2014  Patient:  [name] [name], [name] [name]  Account Number:  [id number] Admit date:  07/01/2014  Clinical Social Worker:  Orpah Greek  Date/time:  07/04/2014 04:12 PM  Clinical Social Work is seeking post-discharge placement for this patient at the following level of care:   SKILLED NURSING   (*CSW will update this form in Epic as items are completed)   07/04/2014  Patient/family provided with [facility] Department of Clinical Social Work's list of facilities offering this level of care within the geographic area requested by the patient (or if unable, by the patient's family).  07/04/2014  Patient/family informed of their freedom to choose among providers that offer the needed level of care, that participate in Medicare, Medicaid or managed care program needed by the patient, have an available bed and are willing to accept the patient.  07/04/2014  Patient/family informed of [facility]' ownership interest in [facility], as well as of the fact that they are under no obligation to receive care at this facility.  PASARR submitted to EDS on 07/04/2014 PASARR number received on 07/04/2014  FL2 transmitted to all facilities in geographic area requested by pt/family on  07/04/2014 FL2 transmitted to all facilities within larger geographic area on   Patient informed that his/her managed care company has contracts with or will negotiate with  certain facilities, including the following:     Patient/family informed of bed offers received:  07/04/2014 Patient chooses bed at [facility] Physician recommends and patient chooses bed at    Patient to be transferred to [facility] on   Patient to be transferred to facility by  Patient and family notified of transfer on  Name of family member notified:    The following physician request were entered in  Epic:   Additional Comments:   Lincoln Maxin, LCSW [facility] Clinical Social Worker cell #: [phone]

## 2014-07-05 DIAGNOSIS — R739 Hyperglycemia, unspecified: Secondary | ICD-10-CM | POA: Diagnosis present

## 2014-07-05 LAB — GLUCOSE, CAPILLARY
GLUCOSE-CAPILLARY: 216 mg/dL — AB (ref 70–99)
GLUCOSE-CAPILLARY: 289 mg/dL — AB (ref 70–99)

## 2014-07-05 LAB — BASIC METABOLIC PANEL
ANION GAP: 3 — AB (ref 5–15)
BUN: 77 mg/dL — AB (ref 6–23)
CO2: 23 mmol/L (ref 19–32)
Calcium: 8.2 mg/dL — ABNORMAL LOW (ref 8.4–10.5)
Chloride: 111 mEq/L (ref 96–112)
Creatinine, Ser: 2.05 mg/dL — ABNORMAL HIGH (ref 0.50–1.35)
GFR, EST AFRICAN AMERICAN: 33 mL/min — AB (ref >90)
GFR, EST NON AFRICAN AMERICAN: 28 mL/min — AB (ref >90)
Glucose, Bld: 251 mg/dL — ABNORMAL HIGH (ref 70–99)
POTASSIUM: 4.7 mmol/L (ref 3.5–5.1)
SODIUM: 137 mmol/L (ref 135–145)

## 2014-07-05 LAB — VANCOMYCIN, RANDOM: Vancomycin Rm: 16.6 ug/mL

## 2014-07-05 MED ORDER — INSULIN ASPART 100 UNIT/ML FLEXPEN: 7.0000 [IU] | PEN_INJECTOR | Freq: Three times a day (TID) | SUBCUTANEOUS | Status: AC

## 2014-07-05 MED ORDER — INSULIN GLARGINE 100 UNIT/ML ~~LOC~~ SOLN: 24.0000 [IU] | Freq: Every day | SUBCUTANEOUS | Status: AC

## 2014-07-05 MED ORDER — CIPROFLOXACIN HCL 500 MG PO TABS: 500.0000 mg | ORAL_TABLET | Freq: Two times a day (BID) | ORAL | Status: AC

## 2014-07-05 MED ORDER — VANCOMYCIN HCL IN DEXTROSE 750-5 MG/150ML-% IV SOLN
750.0000 mg | INTRAVENOUS | Status: DC
  Filled 2014-07-05: qty 150

## 2014-07-05 MED ORDER — CIPROFLOXACIN IN D5W 400 MG/200ML IV SOLN
400.0000 mg | INTRAVENOUS | Status: DC
  Administered 2014-07-05: 400 mg via INTRAVENOUS
  Filled 2014-07-05 (×2): qty 200

## 2014-07-05 MED ORDER — OXYCODONE-ACETAMINOPHEN 5-325 MG PO TABS: 1.0000 | ORAL_TABLET | ORAL | Status: AC

## 2014-07-05 NOTE — Progress Notes (Signed)
[facility]. Patient & wife, [name] [name] aware. [facility]. [facility] called for transport.   Clinical Social Work Department CLINICAL SOCIAL WORK PLACEMENT NOTE 07/05/2014  Patient:  [name] EldersNDERSON,[name] [name]  Account Number:  [id number] Admit date:  07/01/2014  Clinical Social Worker:  Orpah GreekKELLY FOLEY, LCSWA  Date/time:  07/04/2014 04:12 PM  Clinical Social Work is seeking post-discharge placement for this patient at the following level of care:   SKILLED NURSING   (*CSW will update this form in Epic as items are completed)   07/04/2014  Patient/family provided with [facility] Department of Clinical Social Work's list of facilities offering this level of care within the geographic area requested by the patient (or if unable, by the patient's family).  07/04/2014  Patient/family informed of their freedom to choose among providers that offer the needed level of care, that participate in Medicare, Medicaid or managed care program needed by the patient, have an available bed and are willing to accept the patient.  07/04/2014  Patient/family informed of [facility]' ownership interest in [facility], as well as of the fact that they are under no obligation to receive care at this facility.  PASARR submitted to EDS on 07/04/2014 PASARR number received on 07/04/2014  FL2 transmitted to all facilities in geographic area requested by pt/family on  07/04/2014 FL2 transmitted to all facilities within larger geographic area on   Patient informed that his/her managed care company has contracts with or will negotiate with  certain facilities, including the following:     Patient/family informed of bed offers received:  07/04/2014 [facility] Physician recommends and patient chooses bed at    [facility] be [facility] on  07/05/2014 Patient to be  transferred to facility by [facility] Patient and family notified of transfer on 07/05/2014 Name of family member notified:  patient's wife, [name] [name] via phone  The following physician request were entered in Epic:   Additional Comments:   Lincoln MaxinKelly Mavis Fichera, LCSW [name] Clinical Social Worker cell #: [phone]

## 2014-07-05 NOTE — Progress Notes (Signed)
ANTIBIOTIC CONSULT NOTE   Pharmacy Consult for Cipro Indication: UTI  Allergies  Allergen Reactions  . Cymbalta [Duloxetine Hcl] Other (See Comments)    Confused, paralyzed from waist up   . Statins Other (See Comments)    Leg pains    Patient Measurements: Height: 6' (182.9 cm) Weight: 148 lb 13 oz (67.5 kg) IBW/kg (Calculated) : 77.6  Vital Signs: Temp: 97.7 F (36.5 C) (01/05 0652) Temp Source: Oral (01/05 0652) BP: 153/63 mmHg (01/05 0652) Pulse Rate: 49 (01/05 0652) Intake/Output from previous day: 01/04 0701 - 01/05 0700 In: 1190 [P.O.:480; I.V.:710] Out: 1750 [Urine:1750] Intake/Output from this shift:    Labs:  Recent Labs  07/02/14 1008 07/03/14 0408 07/04/14 0438 07/05/14 0415  WBC  --  13.9* 9.2  --   HGB  --  8.4* 8.8*  --   PLT  --  257 242  --   LABCREA 62.1  --   --   --   CREATININE  --  3.14* 2.58* 2.05*   Estimated Creatinine Clearance: 26.1 mL/min (by C-G formula based on Cr of 2.05).  Recent Labs  07/05/14 0820  VANCORANDOM 16.6     Microbiology: Recent Results (from the past 720 hour(s))  Culture, blood (routine x 2)     Status: None   Collection Time: 06/20/14  5:42 PM  Result Value Ref Range Status   Specimen Description BLOOD RIGHT ARM  Final   Special Requests BOTTLES DRAWN AEROBIC AND ANAEROBIC 10CC  Final   Culture  Setup Time   Final    06/21/2014 02:53 Performed at [facility]    Culture   Final    NO GROWTH 5 DAYS Performed at [facility]    Report Status 06/27/2014 FINAL  Final  Culture, blood (routine x 2)     Status: None   Collection Time: 06/20/14  5:50 PM  Result Value Ref Range Status   Specimen Description BLOOD LEFT HAND  Final   Special Requests BOTTLES DRAWN AEROBIC ONLY 5CC  Final   Culture  Setup Time   Final    06/21/2014 02:56 Performed at [facility]    Culture   Final    NO GROWTH 5 DAYS Performed at [facility]    Report Status 06/27/2014 FINAL  Final   MRSA PCR Screening     Status: None   Collection Time: 06/26/14  6:53 AM  Result Value Ref Range Status   MRSA by PCR NEGATIVE NEGATIVE Final    Comment:        The GeneXpert MRSA Assay (FDA approved for NASAL specimens only), is one component of a comprehensive MRSA colonization surveillance program. It is not intended to diagnose MRSA infection nor to guide or monitor treatment for MRSA infections.   Urine culture     Status: None   Collection Time: 06/26/14  6:23 PM  Result Value Ref Range Status   Specimen Description URINE, CATHETERIZED  Final   Special Requests NONE  Final   Colony Count NO GROWTH Performed at [facility]   Final   Culture NO GROWTH Performed at [facility]   Final   Report Status 06/28/2014 FINAL  Final  Urine culture     Status: None   Collection Time: 07/01/14 10:42 PM  Result Value Ref Range Status   Specimen Description URINE, RANDOM  Final   Special Requests Normal  Final   Colony Count   Final    >=  100,000 COLONIES/ML Performed at [facility]   Final    Multiple bacterial morphotypes present, none predominant. Suggest appropriate recollection if clinically indicated. Performed at [facility]    Report Status 07/04/2014 FINAL  Final  Blood culture (routine x 2)     Status: None (Preliminary result)   Collection Time: 07/01/14 11:26 PM  Result Value Ref Range Status   Specimen Description BLOOD LEFT ARM  Final   Special Requests BOTTLES DRAWN AEROBIC AND ANAEROBIC 5CC  Final   Culture   Final           BLOOD CULTURE RECEIVED NO GROWTH TO DATE CULTURE WILL BE HELD FOR 5 DAYS BEFORE ISSUING A FINAL NEGATIVE REPORT Performed at [facility]    Report Status PENDING  Incomplete  Blood culture (routine x 2)     Status: None (Preliminary result)   Collection Time: 07/01/14 11:30 PM  Result Value Ref Range Status   Specimen Description BLOOD RIGHT ARM  Final   Special Requests BOTTLES  DRAWN AEROBIC AND ANAEROBIC 5CC  Final   Culture   Final           BLOOD CULTURE RECEIVED NO GROWTH TO DATE CULTURE WILL BE HELD FOR 5 DAYS BEFORE ISSUING A FINAL NEGATIVE REPORT Performed at [facility]    Report Status PENDING  Incomplete   Assessment: 81 yr male with altered mental status. He was just discharged 12/31, during the stay patient had acute respiratory failure due to pulmonary edema, HCAP and ILD possibly d/t amiodarone toxicity. He received vancomycin/zosyn/azithromycin last admission which was changed to PO cefuroxime. Patient admitted 1/2 with acute encephalopathy, chronic a-fib, leukocytosis, acute renal failure.  AKI attributed to bladder outlet obstruction.  Foley repositioned and SCr now improving.  Pharmacy initially consulted to assist with Vancomycin and Cefepime dosing for sepsis, now transitioned to Cipro for UTI.  1/2 >> Vancomycin >> 1/5 1/2 >> Cefepime>> 1/5 1/5 >> Cipro >>  Tmax: afebrile WBCs: improved to WNL Renal: Scr improving, now 2.05, CrCl ~ 26 mL/min  1/1 blood: NGTD 1/1 urine: >= 100,000 colonies/mL multiple bacterial morphotypes present, none predominant.   Goal of Therapy:  Appropriate antibiotic dosing for renal function and indication Eradication of infection  Plan:   Cipro 400 mg IV q24h for CrCl < 30 mL/min.  Expect may need adjustment as renal function continued to improve.  Monitor renal function, cultures, clinical course.   Greer Pickerel, PharmD, BCPS Pager: [phone] 07/05/2014 10:33 AM

## 2014-07-05 NOTE — Care Management Note (Signed)
    Page 1 of 1   07/05/2014     10:50:14 AM CARE MANAGEMENT NOTE 07/05/2014  Patient:  [name] [name],[name] [name]   Account Number:  [id number]  Date Initiated:  07/05/2014  Documentation initiated by:  Lanier ClamMAHABIR,Bonniejean Piano  Subjective/Objective Assessment:   81 y/o m admitted w/acute encephalopathy.     Action/Plan:   From SNF.   Anticipated DC Date:  07/05/2014   Anticipated DC Plan:  SKILLED NURSING FACILITY      DC Planning Services  CM consult      Choice offered to / List presented to:             Status of service:  Completed, signed off Medicare Important Message given?  YES (If response is "NO", the following Medicare IM given date fields will be blank) Date Medicare IM given:  07/04/2014 Medicare IM given by:  [facility] Corp[facility]HABIR,Ysabel Stankovich Date Additional Medicare IM given:   Additional Medicare IM given by:    Discharge Disposition:  SKILLED NURSING FACILITY  Per UR Regulation:  Reviewed for med. necessity/level of care/duration of stay  If discussed at Long Length of Stay Meetings, dates discussed:    Comments:  07/05/14 Lanier ClamKathy Lucynda Rosano RN BSN NCM [phone] d/c SNF.

## 2014-07-05 NOTE — Discharge Summary (Signed)
Physician Discharge Summary  [name] [name] UJW:[id number] DOB: Sep 23, 1928 DOA: 07/01/2014  PCP: [name], MD  Admit date: 07/01/2014 Discharge date: 07/05/2014  Time spent: 35 minutes  Recommendations for Outpatient Follow-up:  1. Discharged to skilled nursing facility, [facility] 2. Patient will complete antibiotics (ciprofloxacin) on 07/08/2014 3. Patient should follow-up with pulmonary Dr. Kendrick Fries in 2-3 weeks. 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  4. Please check renal function in next 2-3 days.    Discharge Diagnoses:  Principal Problem:   Acute encephalopathy   Active Problems:   Renal failure (ARF), acute on chronic   Acute bilateral obstructive uropathy   UTI (urinary tract infection)   Leucocytosis   CAD - CABG x 4 in 2006   Neurogenic bladder   Chronic renal insufficiency, stage III (moderate)   Chronic atrial fibrillation   Chronic anemia   Diabetes mellitus type 2, uncontrolled   Hypotension    Discharge Condition: fair  Diet recommendation: heart healthy/ diabetic  Filed Weights   07/02/14 0200  Weight: 67.5 kg (148 lb 13 oz)    History of present illness:  Please refer to admission H&P for details, but in brief, 81 year old male with history of CAD status post CABG, A. 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 Workup done in ED showed mild hypercarbia on ABG, normal CT, UTI, and significant leukocytosis. Patient also  had acute worsening of his kidney disease. Patient admitted to stepdown for closer monitoring. Further workup during the hospital stay showed malpositioning of the Foley catheter causing significant hydronephrosis.  Hospital Course:  Acute encephalopathy Likely metabolic associated with uremia secondary to obstructive uropathy, dehydration and underlying UTI . Head CT and MRI brain negative for stroke or acute abnormality. -Patient placed on empiric vancomycin and cefepime on admission. Urine culture growing mixed bacteria. Blood culture negative.  -Mental status returned to baseline once renal function improved and hydrated with IV normal saline.  Acute on chronic kidney disease stage II-III -Possibly a combination of diuretic causing dehydration,  obstructive uropathy from mispositioned foley and UTI.  -Malpositioned Foley seen on imaging and repositioned with good urine output. Renal function improving and should be followed up as outpatient.  -Placed on IV hydration and Lasix held in the hospital. resume Lasix from tomorrow. Appreciated renal recommendations.  -Patient of IV fluids.  Monitor renal function as outpatient.   Leukocytosis Secondary to both infection and steroids. Resolved.  UTI Foley catheter was changed upon admission. Urine growing mixed bacteria. Clear responded to empiric antibiotics. Narrowed to oral ciprofloxacin 500 mg daily to complete a seven-day course.  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 Patient has been on oral prednisone 40 mg daily with recommendations for slow taper over the next 8-12 weeks. Patient showed follow-up with pulmonary in 2-3 weeks.    Atrial fibrillation Rate controlled. Continue aspirin . Held metoprolol due to low blood pressure. Can be resumed upon discharge.. Amiodarone discontinued  during recent hospitalization due  to respiratory symptoms. Not on Coumadin due to fall risk.  Type 2 diabetes mellitus Last A1c of 6.5. Elevated blood glucose possibly secondary to infection and steroid.  Adjusted Lantus while in the hospital. Will increase Lantus dose to 24 units daily and pre-meal aspart dose to 7 units 3 times a day. Monitor as outpatient  Diastolic CHF Patient recently hospitalized for CHF exacerbation and diabetes with IV Lasix.  Hypovolemic on admission . Lasix held and given IV fluids with improvement. Resume home dose Lasix upon discharge.  CAD with hx fo MI s/p CABG Continue aspirin, and Zetia. Resume metoprolol.    Iron def anemia Continue ferrous sulfate  Paraplegia with neurogenic bladder resumed neurontin. Patient reports self-catheterization 3 times a day.   Code Status: Full code  Family Communication: Spoke with wife over the phone   Disposition Plan: SNF    Consultants:  Nephrology    Procedures:  MRI brain  Ultrasound abdomen  CT abdomen and pelvis  Antibiotics: IV vancomycin and cefepime 1/2-1/5 Oral ciprofloxacin 500 mg daily until 1/8   Discharge Exam: Filed Vitals:   07/05/14 0652  BP: 153/63  Pulse: 49  Temp: 97.7 F (36.5 C)  Resp: 18    General: Elderly male lying in bed in NAD  HEENT: moist oral mucosa,   Chest: Clear to auscultation bilaterally  CVS: S1 and S2 irregular, no murmurs rub or gallop  Abdomen: Soft, nondistended, nontender, chronic foley draining clear urine   Extremities: Warm, no edema  CNS: awake and alert X3, paraplegic  Discharge Instructions    Current Discharge Medication List    START taking these medications   Details  ciprofloxacin (CIPRO) 500 MG tablet Take 1 tablet (500 mg total) by mouth 2 (two) times daily. Qty: 4 tablet, Refills: 0      CONTINUE these medications which have CHANGED   Details  insulin aspart (NOVOLOG) 100 UNIT/ML FlexPen Inject 7 Units  into the skin 3 (three) times daily with meals. Qty: 15 mL, Refills: 11    insulin glargine (LANTUS) 100 UNIT/ML injection Inject 0.24 mLs (24 Units total) into the skin daily with breakfast. Qty: 10 mL, Refills: 11    oxyCODONE-acetaminophen (PERCOCET/ROXICET) 5-325 MG per tablet Take 1-2 tablets by mouth See admin instructions. 2 tabs every morning, 1 tab in the afternoon and 2 tabs at bedtime Qty: 30 tablet, Refills: 0      CONTINUE these medications which have NOT CHANGED   Details  amLODipine (NORVASC) 2.5 MG tablet Take 3 tablets (7.5 mg total) by mouth daily. Qty: 90 tablet, Refills: 0    aspirin 81 MG tablet Take 81 mg by mouth daily with breakfast.     calcium carbonate (OS-CAL - DOSED IN MG OF ELEMENTAL CALCIUM) 1250 MG tablet Take 1 tablet by mouth 2 (two) times daily with a meal.     docusate sodium 100 MG CAPS Take 100 mg by mouth 2 (two) times daily. Qty: 60 capsule, Refills: 0    ezetimibe (ZETIA) 10 MG tablet Take 1 tablet (10 mg total) by mouth daily. Qty: 90 tablet, Refills: 3    furosemide (LASIX) 20 MG tablet Take 1 tablet (20 mg total) by mouth daily. Qty: 30 tablet, Refills: 0    gabapentin (NEURONTIN) 300 MG capsule Take 1 capsule (300 mg total) by mouth 2 (two) times daily. Qty: 60 capsule, Refills: 0    metoprolol (LOPRESSOR) 50 MG tablet Take 50 mg by mouth 2 (two) times daily.    MILK  THISTLE PO Take 1 tablet by mouth daily with breakfast.    Multiple Vitamin (MULTIVITAMIN WITH MINERALS) TABS tablet Take 1 tablet by mouth daily.    Omega-3 Fatty Acids (FISH OIL PO) Take 1 capsule by mouth 2 (two) times daily.    potassium chloride (K-DUR,KLOR-CON) 10 MEQ tablet Take 1 tablet (10 mEq total) by mouth daily. Qty: 30 tablet, Refills: 0    Probiotic Product (PROBIOTIC DAILY) CAPS Take 1 capsule by mouth every evening.    ferrous sulfate 325 (65 FE) MG tablet Take 1 tablet (325 mg total) by mouth daily with breakfast. Qty: 30 tablet, Refills: 0     predniSONE (DELTASONE) 20 MG tablet Take 2 tablets (40 mg total) by mouth daily with breakfast. Qty: 30 tablet, Refills: 0       Allergies  Allergen Reactions  . Cymbalta [Duloxetine Hcl] Other (See Comments)    Confused, paralyzed from waist up   . Statins Other (See Comments)    Leg pains   Follow-up Information    Follow up with Max Fickle, MD. Call in 3 weeks.   Specialty:  Pulmonary Disease   Contact information:   [address] [place] [address] [phone]        The results of significant diagnostics from this hospitalization (including imaging, microbiology, ancillary and laboratory) are listed below for reference.    Significant Diagnostic Studies: Ct Abdomen Pelvis Wo Contrast  07/02/2014   ADDENDUM REPORT: 07/02/2014 18:25  ADDENDUM: I discussed the Foley catheter placement with Dr. Gonzella Lex at 1824 hours on 07/02/2013.   Electronically Signed   By: Kennith Center M.D.   On: 07/02/2014 18:25   07/02/2014   CLINICAL DATA:  Initial encounter for hydronephrosis.  EXAM: CT ABDOMEN AND PELVIS WITHOUT CONTRAST  TECHNIQUE: Multidetector CT imaging of the abdomen and pelvis was performed following the standard protocol without IV contrast.  COMPARISON:  06/20/2014  FINDINGS: Lower chest: Chronic interstitial lung disease superimposed on emphysema. There is peripheral honeycombing compatible with UIP.  Hepatobiliary: No focal abnormality in the liver on this study without intravenous contrast. No evidence for hepatomegaly. Gallbladder is distended with some probable layering sludge. No intrahepatic or extrahepatic biliary dilation.  Pancreas: Diffuse pancreatic atrophy noted with diffuse distention of the main pancreatic duct. These findings are associated with the innumerable pancreatic calcifications throughout the parenchyma. Together imaging features are compatible with a history of chronic pancreatitis.  Spleen: No splenomegaly. No focal mass lesion.  Adrenals/Urinary Tract:  No adrenal nodule or mass. Since the prior study, the patient has developed mild to moderate bilateral hydroureteronephrosis. Right renal cyst again noted. Nonobstructing left-sided renal stone is again evident. The ureters remain dilated into the pelvis where the urinary bladder is markedly distended. There is a gas bubble in the bladder likely secondary to instrumentation. Of particular note, the patient's Foley balloon is inflated within the lower prostatic urethra.  Stomach/Bowel: Stomach is nondistended. No gastric wall thickening. No evidence of outlet obstruction. Duodenum is normally positioned as is the ligament of Treitz. No small bowel wall thickening. No small bowel dilatation. Terminal ileum is normal. Appendix is normal. Colon is unremarkable.  Vascular/Lymphatic: Atherosclerotic calcification is noted in the wall of the abdominal aorta without aneurysm. The aorta the level of the diaphragmatic hiatus measures 4 cm and thoracic segments of the aorta were better evaluated on the previous CT scan.  Reproductive: Calcification is seen in the prostate gland. Seminal vesicles are unremarkable.  Other: No intraperitoneal free fluid. There is  some edema in the soft tissues of the pelvic floor.  Musculoskeletal: Patient is status post lower lumbar fusion.  IMPRESSION: 1. 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 This may be secondary to a idiopathic pulmonary fibrosis. 2. Changes consistent with chronic pancreatitis. 3. Bilateral hydroureteronephrosis with bladder distension. This is compatible with the patient's Foley balloon being inflated in the lower prostatic urethra. 4. Other incidental findings as above.  Electronically Signed: By: Kennith Center M.D. On: 07/02/2014 18:18   Ct Abdomen Pelvis Wo Contrast  06/20/2014   CLINICAL DATA:  Fall in bathroom. Anti coagulation. Increased confusion.  EXAM: CT CHEST, ABDOMEN AND PELVIS WITHOUT CONTRAST  TECHNIQUE: Multidetector CT imaging of the chest, abdomen and  pelvis was performed following the standard protocol without IV contrast.  COMPARISON:  Multiple exams, including 05/19/2014 and 03/26/2005  FINDINGS: CT CHEST FINDINGS  Right upper paratracheal node 1.2 cm in short axis, image 12 series 2. Subcarinal node 1.4 cm in short axis, image 34 series 2. Additional paratracheal nodes are present. If hilar nodes are enlarged there difficult to measure against the background vasculature.  Aortic and branch vessel atherosclerosis. Prior CABG. Ascending thoracic aorta 4.6 cm transverse ; descending thoracic aorta 4.6 cm transverse. Lower descending thoracic aorta 5.0 cm transverse. There is mixed density surrounding tubular calcification in the ascending thoracic aorta potentially from chronic dissection, chronic mural thrombus, or even intramural hematoma. Some of this appearance could be from an ascending aortic graft-correlate with patient history. Without IV contrast did is difficult to characterize this further.  Trace left pleural effusion. Mild enlargement of the cardiopericardial silhouette  Emphysema is present with scattered scarring and peripheral fibrosis potentially with some early honeycombing at the lung bases. In addition, there is a 2.4 by 1.4 cm nodule anteriorly in the right upper lobe, image 25 of series 5 interstitial accentuation is present bilaterally.  CT ABDOMEN AND PELVIS FINDINGS  Hepatobiliary: Unremarkable  Pancreas: Speckled calcifications throughout the pancreatic parenchyma compatible with chronic calcific pancreatitis.  Spleen: Unremarkable  Adrenals/Urinary Tract: 8 mm left kidney lower pole nonobstructive calculus. Bilateral fluid density renal lesions favoring cysts. No hydronephrosis or hydroureter. No ureteral calculi.  Stomach/Bowel: Prominent stool throughout the colon favors constipation.  Vascular/Lymphatic: Dense aortoiliac atherosclerosis.  Reproductive: Enlarged prostate gland with scattered calcifications, measuring 5.8 by 4.6 cm.   Other: No supplemental non-categorized findings.  Musculoskeletal: Bridging fusion of the sacroiliac joints. posterolateral rod and pedicle screw fixation at L4-5 with interbody and anterior facet fusion. Intervertebral and facet spurring at L3-4 and L5-S1. Small bilateral inguinal hernias contain adipose tissue.  IMPRESSION: 1. 2.4 by 1.4 cm right upper lobe pulmonary nodule with right paratracheal adenopathy, concerning for malignancy. An atypical infectious process could potentially give this appearance. Consider nuclear medicine PET-CT. 2. Severe emphysema with peripheral fibrosis, interstitial accentuation, and some basilar honeycombing. 3. Ascending and descending thoracic aortic aneurysms measuring up to 5 cm transversely. There is a mixed density in the ascending aortic aneurysm with a complex appearance, possibly from graft material and excluded thrombus, chronic mural thrombus, chronic dissection, or intramural hematoma. Correlate with operative history. If IV contrast could not be administered, MRI might help further assess the thoracic aorta. Otherwise I recommend semi-annual imaging followup by CTA or MRA and referral to cardiothoracic surgery if not already obtained. This recommendation follows 2010 ACCF/AHA/AATS/ACR/ASA/SCA/SCAI/SIR/STS/SVM Guidelines for the Diagnosis and Management of Patients With Thoracic Aortic Disease. Circulation. 2010; 121: U440-H474. 4. Trace left pleural effusion. 5. Chronic calcific pancreatitis. 6.  Prominent stool  throughout the colon favors constipation. 7. Enlarged prostate gland. 8. Lumbar spondylosis particularly at L3-4 and L5-S1.   Electronically Signed   By: Herbie BaltimoreWalt  Liebkemann M.D.   On: 06/20/2014 13:58   Dg Chest 2 View  07/01/2014   CLINICAL DATA:  Altered mental status. Weakness. Patient not able to speak.  EXAM: CHEST  2 VIEW  COMPARISON:  06/29/2014  FINDINGS: Postoperative changes in the mediastinum. Low lung volumes with diffuse interstitial changes  consistent with fibrosis. No superimposed airspace disease or effusion. No pneumothorax. Calcified and tortuous aorta. Heart size and pulmonary vascularity are normal. No change since previous study.  IMPRESSION: Diffuse pulmonary fibrosis with associated low lung volumes. No superimposed airspace disease.   Electronically Signed   By: Burman NievesWilliam  Stevens M.D.   On: 07/01/2014 22:17   Ct Head Wo Contrast  07/01/2014   CLINICAL DATA:  Weakness. Lethargy. Sleeping a lot and unable to wake. Confusion.  EXAM: CT HEAD WITHOUT CONTRAST  TECHNIQUE: Contiguous axial images were obtained from the base of the skull through the vertex without intravenous contrast.  COMPARISON:  06/20/2014  FINDINGS: Diffuse cerebral atrophy. Mild ventricular dilatation consistent with central atrophy. Patchy low-attenuation changes in the deep white matter consistent with small vessel ischemia. Extra-axial fluid collection in the middle cranial fossa consistent with arachnoid cyst. No change since prior study. No mass effect or midline shift. No abnormal extra-axial fluid collections. Gray-white matter junctions are distinct. Basal cisterns are not effaced. No evidence of acute intracranial hemorrhage. No depressed skull fractures. Partial opacification of the left frontal sinuses. Paranasal sinuses are otherwise clear. Mastoid air cells are not opacified. Extensive vascular calcifications.  IMPRESSION: No acute intracranial abnormalities. Extra-axial cystic collection in the right middle cranial fossa. Chronic atrophy and small vessel ischemic changes. No change since previous study.   Electronically Signed   By: Burman NievesWilliam  Stevens M.D.   On: 07/01/2014 22:20   Ct Head Wo Contrast  06/20/2014   CLINICAL DATA:  Fall.  Hypoxia.  EXAM: CT HEAD WITHOUT CONTRAST  CT CERVICAL SPINE WITHOUT CONTRAST  TECHNIQUE: Multidetector CT imaging of the head and cervical spine was performed following the standard protocol without intravenous contrast.  Multiplanar CT image reconstructions of the cervical spine were also generated.  COMPARISON:  Report from 11/24/2000  FINDINGS: CT HEAD FINDINGS  Hypodense lesion in the right middle cranial fossa tracking up into the sylvian fissure, fluid density, probably an arachnoid cyst or epidermoid. Size 4.0 by 3.2 by approximately 6.0 cm.  Atherosclerosis noted. The brainstem, cerebellum, cerebral peduncle sits, thalami, and basal ganglia appear unremarkable. The ventricular system appears normal and aside from the right middle cranial fossa lesion the basilar cisterns appear normal.  Periventricular white matter and corona radiata hypodensities favor chronic ischemic microvascular white matter disease. No intracranial hemorrhage, mass lesion, or acute CVA. There is a subcutaneous lesion along the occiput/upper neck in the midline on image 1 of series 3 which could represent subcutaneous bruising. Chronic left frontal sinusitis.  CT CERVICAL SPINE FINDINGS  Considerable degenerative findings at the craniocervical junction and anterior arch of C1-2 with extensive spurring of the basion and pannus formation posterior to the odontoid. Prominent loss of articular space at the anterior C1-2 articulation.  Uncinate and facet spurring cause osseous foraminal stenosis on the right at C2-3 and on the left at C3-4, C4-5, and C6-7. There is interbody fusion at the C5-6 low-level and facet fusion on the left at C4-5 and C7-T1.  There is 2 mm degenerative  anterolisthesis at C4-5 and at C7-T1. Degenerative endplate sclerosis noted with posterior osseous ridging at C6-7, C7-T1, and T1-2.  No prevertebral soft tissue swelling. No acute fracture is identified. There is dextroconvex cervical scoliosis.  Prominent emphysema and scarring noted at the lung apices.  IMPRESSION: 1. Right middle cranial fossa arachnoid cyst or epidermoid tracking into the sylvian fissure. MRI can typically differentiate between these 2 entities if clinically  warranted. 2. Atherosclerosis. 3. Dense subcutaneous lesion along the occiput but and upper neck could represent some focal bruising. 4. Chronic left frontal sinusitis. 5. Cervical spondylosis causes multilevel osseous foraminal impingement. 6. Considerable spurring at the C1- 2 articulation and along the basion, with pannus formation posterior to the odontoid-rheumatoid arthropathy not excluded. 7. Emphysema.   Electronically Signed   By: Herbie Baltimore M.D.   On: 06/20/2014 13:36   Ct Chest Wo Contrast  06/20/2014   CLINICAL DATA:  Fall in bathroom. Anti coagulation. Increased confusion.  EXAM: CT CHEST, ABDOMEN AND PELVIS WITHOUT CONTRAST  TECHNIQUE: Multidetector CT imaging of the chest, abdomen and pelvis was performed following the standard protocol without IV contrast.  COMPARISON:  Multiple exams, including 05/19/2014 and 03/26/2005  FINDINGS: CT CHEST FINDINGS  Right upper paratracheal node 1.2 cm in short axis, image 12 series 2. Subcarinal node 1.4 cm in short axis, image 34 series 2. Additional paratracheal nodes are present. If hilar nodes are enlarged there difficult to measure against the background vasculature.  Aortic and branch vessel atherosclerosis. Prior CABG. Ascending thoracic aorta 4.6 cm transverse ; descending thoracic aorta 4.6 cm transverse. Lower descending thoracic aorta 5.0 cm transverse. There is mixed density surrounding tubular calcification in the ascending thoracic aorta potentially from chronic dissection, chronic mural thrombus, or even intramural hematoma. Some of this appearance could be from an ascending aortic graft-correlate with patient history. Without IV contrast did is difficult to characterize this further.  Trace left pleural effusion. Mild enlargement of the cardiopericardial silhouette  Emphysema is present with scattered scarring and peripheral fibrosis potentially with some early honeycombing at the lung bases. In addition, there is a 2.4 by 1.4 cm nodule  anteriorly in the right upper lobe, image 25 of series 5 interstitial accentuation is present bilaterally.  CT ABDOMEN AND PELVIS FINDINGS  Hepatobiliary: Unremarkable  Pancreas: Speckled calcifications throughout the pancreatic parenchyma compatible with chronic calcific pancreatitis.  Spleen: Unremarkable  Adrenals/Urinary Tract: 8 mm left kidney lower pole nonobstructive calculus. Bilateral fluid density renal lesions favoring cysts. No hydronephrosis or hydroureter. No ureteral calculi.  Stomach/Bowel: Prominent stool throughout the colon favors constipation.  Vascular/Lymphatic: Dense aortoiliac atherosclerosis.  Reproductive: Enlarged prostate gland with scattered calcifications, measuring 5.8 by 4.6 cm.  Other: No supplemental non-categorized findings.  Musculoskeletal: Bridging fusion of the sacroiliac joints. posterolateral rod and pedicle screw fixation at L4-5 with interbody and anterior facet fusion. Intervertebral and facet spurring at L3-4 and L5-S1. Small bilateral inguinal hernias contain adipose tissue.  IMPRESSION: 1. 2.4 by 1.4 cm right upper lobe pulmonary nodule with right paratracheal adenopathy, concerning for malignancy. An atypical infectious process could potentially give this appearance. Consider nuclear medicine PET-CT. 2. Severe emphysema with peripheral fibrosis, interstitial accentuation, and some basilar honeycombing. 3. Ascending and descending thoracic aortic aneurysms measuring up to 5 cm transversely. There is a mixed density in the ascending aortic aneurysm with a complex appearance, possibly from graft material and excluded thrombus, chronic mural thrombus, chronic dissection, or intramural hematoma. Correlate with operative history. If IV contrast could not be administered, MRI  might help further assess the thoracic aorta. Otherwise I recommend semi-annual imaging followup by CTA or MRA and referral to cardiothoracic surgery if not already obtained. This recommendation follows  2010 ACCF/AHA/AATS/ACR/ASA/SCA/SCAI/SIR/STS/SVM Guidelines for the Diagnosis and Management of Patients With Thoracic Aortic Disease. Circulation. 2010; 121: Z610-R604. 4. Trace left pleural effusion. 5. Chronic calcific pancreatitis. 6.  Prominent stool throughout the colon favors constipation. 7. Enlarged prostate gland. 8. Lumbar spondylosis particularly at L3-4 and L5-S1.   Electronically Signed   By: Herbie Baltimore M.D.   On: 06/20/2014 13:58   Ct Cervical Spine Wo Contrast  06/20/2014   CLINICAL DATA:  Fall.  Hypoxia.  EXAM: CT HEAD WITHOUT CONTRAST  CT CERVICAL SPINE WITHOUT CONTRAST  TECHNIQUE: Multidetector CT imaging of the head and cervical spine was performed following the standard protocol without intravenous contrast. Multiplanar CT image reconstructions of the cervical spine were also generated.  COMPARISON:  Report from 11/24/2000  FINDINGS: CT HEAD FINDINGS  Hypodense lesion in the right middle cranial fossa tracking up into the sylvian fissure, fluid density, probably an arachnoid cyst or epidermoid. Size 4.0 by 3.2 by approximately 6.0 cm.  Atherosclerosis noted. The brainstem, cerebellum, cerebral peduncle sits, thalami, and basal ganglia appear unremarkable. The ventricular system appears normal and aside from the right middle cranial fossa lesion the basilar cisterns appear normal.  Periventricular white matter and corona radiata hypodensities favor chronic ischemic microvascular white matter disease. No intracranial hemorrhage, mass lesion, or acute CVA. There is a subcutaneous lesion along the occiput/upper neck in the midline on image 1 of series 3 which could represent subcutaneous bruising. Chronic left frontal sinusitis.  CT CERVICAL SPINE FINDINGS  Considerable degenerative findings at the craniocervical junction and anterior arch of C1-2 with extensive spurring of the basion and pannus formation posterior to the odontoid. Prominent loss of articular space at the anterior C1-2  articulation.  Uncinate and facet spurring cause osseous foraminal stenosis on the right at C2-3 and on the left at C3-4, C4-5, and C6-7. There is interbody fusion at the C5-6 low-level and facet fusion on the left at C4-5 and C7-T1.  There is 2 mm degenerative anterolisthesis at C4-5 and at C7-T1. Degenerative endplate sclerosis noted with posterior osseous ridging at C6-7, C7-T1, and T1-2.  No prevertebral soft tissue swelling. No acute fracture is identified. There is dextroconvex cervical scoliosis.  Prominent emphysema and scarring noted at the lung apices.  IMPRESSION: 1. Right middle cranial fossa arachnoid cyst or epidermoid tracking into the sylvian fissure. MRI can typically differentiate between these 2 entities if clinically warranted. 2. Atherosclerosis. 3. Dense subcutaneous lesion along the occiput but and upper neck could represent some focal bruising. 4. Chronic left frontal sinusitis. 5. Cervical spondylosis causes multilevel osseous foraminal impingement. 6. Considerable spurring at the C1- 2 articulation and along the basion, with pannus formation posterior to the odontoid-rheumatoid arthropathy not excluded. 7. Emphysema.   Electronically Signed   By: Herbie Baltimore M.D.   On: 06/20/2014 13:36   Mr Brain Wo Contrast  07/02/2014   CLINICAL DATA:  Acute encephalopathy. Altered mental status. Lethargic yesterday but became unresponsive this morning.  EXAM: MRI HEAD WITHOUT CONTRAST  TECHNIQUE: Multiplanar, multiecho pulse sequences of the brain and surrounding structures were obtained without intravenous contrast.  COMPARISON:  Head CT 07/01/2013  FINDINGS: Diffusion imaging does not show any acute or subacute infarction. There are minimal chronic small-vessel changes of the pons. No cerebellar insult. The cerebral hemispheres show mild age related atrophy with minimal  small vessel change of the deep white matter. There is a chronic arachnoid cyst in the middle cranial fossa anteriorly on the  right, unchanged since previous studies. No mass lesion, hemorrhage, hydrocephalus or extra-axial/subdural hematoma. No pituitary mass. No inflammatory sinus disease. No skull or skullbase lesion.  IMPRESSION: No acute or reversible finding.  Mild age related atrophy and minimal small vessel change.  Chronic insignificant right middle cranial fossa arachnoid cyst.   Electronically Signed   By: Paulina Fusi M.D.   On: 07/02/2014 11:34   US Renal  07/02/2014   CLINICAL DATA:  Acute kidney injury  EXAM: RENAL/URINARY TRACT ULTRASOUND COMPLETE  COMPARISON:  CT of 06/20/2014  FINDINGS: Right Kidney:  Length: 10.1 cm. Mildly increased in echogenicity. Mild to moderate hydroureteronephrosis. Upper pole 2.9 cm cyst or minimally complex cyst.  Left Kidney:  Length: 11.2 cm. Mildly increased echogenicity. Moderate left-sided hydroureteronephrosis. Suspect a stone within the region of the left ureteropelvic junction. 1.4 cm on image 39.  Bladder:  Bladder distension with debris within. Foley catheter identified, possibly malpositioned. Example image 75. Bladder wall thickening, 6 mm.  IMPRESSION: 1. Left greater than right hydronephrosis. There may be a stone within the dilated left ureteropelvic junction. This may be due to the bladder process. 2. Increased renal echogenicity, suggesting medical renal disease. 3. Bladder distension with debris within and wall thickening. Suspicious for cystitis. 4. Suspicion of malpositioned Foley catheter. Suboptimally evaluated. Consider further evaluation with abdominal pelvic CT. These results were called by telephone at the time of interpretation on 07/02/2014 at 11:54 am to [facility], Who verbally acknowledged these results.   Electronically Signed   By: Jeronimo Greaves M.D.   On: 07/02/2014 11:56   Dg Chest Port 1 View  06/29/2014   CLINICAL DATA:  Pulmonary infiltrates.  Respiratory failure.  EXAM: PORTABLE CHEST - 1 VIEW  COMPARISON:  06/27/2014.  FINDINGS: Median sternotomy and  CABG. The cardiopericardial silhouette remains enlarged. Mediastinal contours appear similar allowing for rotation and projectional differences on today's examination.  The lungs show pulmonary fibrosis with no convincing evidence of superimposed pneumonia. Based on the pulmonary fibrosis, interstitial pulmonary edema cannot be excluded.  Monitoring leads project over the chest.  IMPRESSION: 1. Unchanged pulmonary fibrosis. 2. No definite superimposed acute cardiopulmonary disease. 3. Unchanged cardiomegaly.  CABG.   Electronically Signed   By: Andreas Newport M.D.   On: 06/29/2014 07:46   Dg Chest Port 1 View  06/27/2014   CLINICAL DATA:  Acute respiratory failure.  EXAM: PORTABLE CHEST - 1 VIEW  COMPARISON:  06/25/2014, 06/23/2014, 03/29/2013.  CT 06/20/2014.  FINDINGS: Mediastinum hilar structures normal. Prior CABG. Stable cardiomegaly with normal pulmonary vascularity. Persistent dense bilateral pulmonary interstitial infiltrates. Known right upper lobe pulmonary nodule better characterized by recent CT. No pleural effusion or pneumothorax. No acute osseus abnormality.  IMPRESSION: 1. Persistent dense bilateral pulmonary interstitial infiltrates. 2. Known right upper lobe pulmonary nodule better characterized by CT of 06/20/2014. 3. Prior CABG.  Heart size is stable.  Pulmonary vascularity normal.   Electronically Signed   By: Maisie Fus  Register   On: 06/27/2014 07:19   Dg Chest Port 1 View  06/25/2014   CLINICAL DATA:  Acute onset of hypoxia and worsening shortness of breath. Initial encounter.  EXAM: PORTABLE CHEST - 1 VIEW  COMPARISON:  Chest radiograph performed 06/23/2014, and CT of the chest performed 06/20/2014  FINDINGS: There is mildly worsened diffuse interstitial prominence, raising suspicion for mild pulmonary edema or atypical pneumonia, superimposed  on the patient's chronic emphysema, fibrosis and interstitial lung disease. The known right upper lobe pulmonary nodule is better characterized  on recent CT. No definite pleural effusion or pneumothorax is seen.  The cardiomediastinal silhouette remains normal in size. The patient is status post median sternotomy, with evidence of prior CABG. No acute osseous abnormalities are seen.  IMPRESSION: 1. Mildly worsened diffuse interstitial prominence, raising suspicion for mild pulmonary edema or atypical pneumonia, superimposed on the patient's chronic emphysema, fibrosis and interstitial lung disease. 2. Known right upper lobe pulmonary nodule is better characterized on recent CT.   Electronically Signed   By: Roanna Raider M.D.   On: 06/25/2014 21:53   Dg Chest Port 1 View  06/23/2014   CLINICAL DATA:  Acute respiratory failure, hypoxia  EXAM: PORTABLE CHEST - 1 VIEW  COMPARISON:  06/20/2014  FINDINGS: Cardiomediastinal silhouette is stable. Again noted reticular diffuse interstitial prominence bilaterally probable due to chronic interstitial lung disease and fibrotic changes. No definite superimposed infiltrate or pulmonary edema. Poorly visualized nodule in right upper lobe again noted measures about 2 cm. This was better visualized on recent CT scan.  IMPRESSION: Again noted diffuse reticular interstitial prominence probable due to chronic interstitial lung disease and fibrotic changes. No definite superimposed infiltrate. Again noted nodule in right upper lobe medially. This was better visualized on recent CT scan.   Electronically Signed   By: Natasha Mead M.D.   On: 06/23/2014 11:01   Dg Chest Portable 1 View  06/20/2014   CLINICAL DATA:  Fall.  Pneumonia.  Shortness of breath.  Confusion.  EXAM: PORTABLE CHEST - 1 VIEW  COMPARISON:  05/19/2014  FINDINGS: Diffuse interstitial and patchy airspace opacities are present superimposed on emphysema. Atherosclerotic aortic arch and prior CABG. Heart size within normal limits for projection. Stable biapical pleural parenchymal scarring. No pneumothorax observed. No definite blunting of the costophrenic  angles.  IMPRESSION: 1. Bilateral interstitial and airspace opacities, worsened, superimposed on severe emphysema. Appearance could reflect edema or atypical pneumonia.   Electronically Signed   By: Herbie Baltimore M.D.   On: 06/20/2014 12:09    Microbiology: Recent Results (from the past 240 hour(s))  MRSA PCR Screening     Status: None   Collection Time: 06/26/14  6:53 AM  Result Value Ref Range Status   MRSA by PCR NEGATIVE NEGATIVE Final    Comment:        The GeneXpert MRSA Assay (FDA approved for NASAL specimens only), is one component of a comprehensive MRSA colonization surveillance program. It is not intended to diagnose MRSA infection nor to guide or monitor treatment for MRSA infections.   Urine culture     Status: None   Collection Time: 06/26/14  6:23 PM  Result Value Ref Range Status   Specimen Description URINE, CATHETERIZED  Final   Special Requests NONE  Final   Colony Count NO GROWTH Performed at [facility]   Final   Culture NO GROWTH Performed at [facility]   Final   Report Status 06/28/2014 FINAL  Final  Urine culture     Status: None   Collection Time: 07/01/14 10:42 PM  Result Value Ref Range Status   Specimen Description URINE, RANDOM  Final   Special Requests Normal  Final   Colony Count   Final    >=100,000 COLONIES/ML Performed at [facility]    Culture   Final    Multiple bacterial morphotypes present, none predominant. Suggest appropriate recollection if  clinically indicated. Performed at [facility]    Report Status 07/04/2014 FINAL  Final  Blood culture (routine x 2)     Status: None (Preliminary result)   Collection Time: 07/01/14 11:26 PM  Result Value Ref Range Status   Specimen Description BLOOD LEFT ARM  Final   Special Requests BOTTLES DRAWN AEROBIC AND ANAEROBIC 5CC  Final   Culture   Final           BLOOD CULTURE RECEIVED NO GROWTH TO DATE CULTURE WILL BE HELD FOR 5 DAYS BEFORE ISSUING A  FINAL NEGATIVE REPORT Performed at [facility]    Report Status PENDING  Incomplete  Blood culture (routine x 2)     Status: None (Preliminary result)   Collection Time: 07/01/14 11:30 PM  Result Value Ref Range Status   Specimen Description BLOOD RIGHT ARM  Final   Special Requests BOTTLES DRAWN AEROBIC AND ANAEROBIC 5CC  Final   Culture   Final           BLOOD CULTURE RECEIVED NO GROWTH TO DATE CULTURE WILL BE HELD FOR 5 DAYS BEFORE ISSUING A FINAL NEGATIVE REPORT Performed at [facility]    Report Status PENDING  Incomplete     Labs: Basic Metabolic Panel:  Recent Labs Lab 06/29/14 0410 06/30/14 0415 07/01/14 2215 07/02/14 0418 07/03/14 0408 07/04/14 0438 07/05/14 0415  NA 142 143 142 138 137 138 137  K 3.7 3.6 4.8 4.6 4.0 4.8 4.7  CL 105 107 111 107 110 110 111  CO2 27 27 23 23 20 22 23   GLUCOSE 269* 96 243* 286* 171* 339* 251*  BUN 97* 98* 122* 129* 124* 103* 77*  CREATININE 2.50* 2.48* 3.11* 3.39* 3.14* 2.58* 2.05*  CALCIUM 9.1 8.7 8.6 8.3* 7.5* 8.0* 8.2*  MG 2.3 2.4  --   --   --   --   --   PHOS 4.2  --   --   --   --   --   --    Liver Function Tests:  Recent Labs Lab 07/01/14 2215 07/02/14 0418  AST 13 13  ALT 33 31  ALKPHOS 69 66  BILITOT 1.0 0.9  PROT 7.0 6.6  ALBUMIN 3.1* 2.8*    Recent Labs Lab 07/01/14 2215  LIPASE 22    Recent Labs Lab 07/02/14 0129  AMMONIA <9*   CBC:  Recent Labs Lab 06/29/14 0410 07/01/14 2102 07/02/14 0418 07/03/14 0408 07/04/14 0438  WBC 11.7* 29.8* 24.4* 13.9* 9.2  NEUTROABS  --  28.6* 23.4*  --   --   HGB 10.2* 11.6* 10.8* 8.4* 8.8*  HCT 32.2* 36.9* 35.0* 27.0* 29.0*  MCV 90.2 92.0 91.6 92.5 94.2  PLT 340 299 288 257 242   Cardiac Enzymes:  Recent Labs Lab 07/02/14 0330  TROPONINI 0.04*   BNP: BNP (last 3 results)  Recent Labs  05/18/14 1410 06/20/14 1202  PROBNP 4183.0* 14250.0*   CBG:  Recent Labs Lab 07/04/14 0752 07/04/14 1130 07/04/14 1647 07/05/14 0710  07/05/14 1142  GLUCAP 305* 303* 288* 216* 289*       Signed:  Kaidence Callaway  [facility] 07/05/2014, 12:14 PM

## 2014-07-05 NOTE — Progress Notes (Signed)
PT Cancellation Note  Patient Details Name: [name] [name] MRN: [id number] DOB: 09/09/1928   Cancelled Treatment:    Reason Eval/Treat Not Completed: Other (comment) (defer PT eval to SNF, to D/C today)  IF pt does not D/C today and PT needs to assess, please re-order. Thank you   [facility] Hospital[facility]ILLIAMS,Elder Davidian 07/05/2014, 11:42 AM

## 2014-07-08 LAB — CULTURE, BLOOD (ROUTINE X 2)
Culture: NO GROWTH
Culture: NO GROWTH

## 2014-07-19 ENCOUNTER — Institutional Professional Consult (permissible substitution): Payer: Self-pay | Admitting: Internal Medicine

## 2014-07-22 ENCOUNTER — Inpatient Hospital Stay: Payer: Medicare Other | Admitting: Pulmonary Disease

## 2014-08-15 ENCOUNTER — Inpatient Hospital Stay: Payer: Medicare Other | Admitting: Pulmonary Disease

## 2014-08-16 ENCOUNTER — Inpatient Hospital Stay (HOSPITAL_COMMUNITY): Payer: Medicare Other

## 2014-08-16 ENCOUNTER — Emergency Department (HOSPITAL_COMMUNITY): Payer: Medicare Other

## 2014-08-16 ENCOUNTER — Encounter (HOSPITAL_COMMUNITY): Payer: Self-pay | Admitting: *Deleted

## 2014-08-16 ENCOUNTER — Inpatient Hospital Stay (HOSPITAL_COMMUNITY)
Admission: EM | Admit: 2014-08-16 | Discharge: 2014-08-30 | DRG: 871 | Disposition: E | Payer: Medicare Other | Attending: Family Medicine | Admitting: Family Medicine

## 2014-08-16 DIAGNOSIS — J9621 Acute and chronic respiratory failure with hypoxia: Secondary | ICD-10-CM | POA: Diagnosis present

## 2014-08-16 DIAGNOSIS — Z951 Presence of aortocoronary bypass graft: Secondary | ICD-10-CM | POA: Diagnosis not present

## 2014-08-16 DIAGNOSIS — A419 Sepsis, unspecified organism: Secondary | ICD-10-CM | POA: Diagnosis not present

## 2014-08-16 DIAGNOSIS — Z66 Do not resuscitate: Secondary | ICD-10-CM | POA: Diagnosis present

## 2014-08-16 DIAGNOSIS — Z515 Encounter for palliative care: Secondary | ICD-10-CM | POA: Diagnosis not present

## 2014-08-16 DIAGNOSIS — E785 Hyperlipidemia, unspecified: Secondary | ICD-10-CM | POA: Diagnosis present

## 2014-08-16 DIAGNOSIS — R011 Cardiac murmur, unspecified: Secondary | ICD-10-CM | POA: Diagnosis present

## 2014-08-16 DIAGNOSIS — J849 Interstitial pulmonary disease, unspecified: Secondary | ICD-10-CM | POA: Diagnosis present

## 2014-08-16 DIAGNOSIS — K7200 Acute and subacute hepatic failure without coma: Secondary | ICD-10-CM | POA: Diagnosis present

## 2014-08-16 DIAGNOSIS — K567 Ileus, unspecified: Secondary | ICD-10-CM | POA: Diagnosis present

## 2014-08-16 DIAGNOSIS — R7989 Other specified abnormal findings of blood chemistry: Secondary | ICD-10-CM | POA: Diagnosis present

## 2014-08-16 DIAGNOSIS — E11649 Type 2 diabetes mellitus with hypoglycemia without coma: Secondary | ICD-10-CM | POA: Diagnosis present

## 2014-08-16 DIAGNOSIS — Z888 Allergy status to other drugs, medicaments and biological substances status: Secondary | ICD-10-CM | POA: Diagnosis not present

## 2014-08-16 DIAGNOSIS — G894 Chronic pain syndrome: Secondary | ICD-10-CM | POA: Diagnosis present

## 2014-08-16 DIAGNOSIS — I129 Hypertensive chronic kidney disease with stage 1 through stage 4 chronic kidney disease, or unspecified chronic kidney disease: Secondary | ICD-10-CM | POA: Diagnosis present

## 2014-08-16 DIAGNOSIS — Z794 Long term (current) use of insulin: Secondary | ICD-10-CM

## 2014-08-16 DIAGNOSIS — E1140 Type 2 diabetes mellitus with diabetic neuropathy, unspecified: Secondary | ICD-10-CM | POA: Diagnosis present

## 2014-08-16 DIAGNOSIS — E1165 Type 2 diabetes mellitus with hyperglycemia: Secondary | ICD-10-CM

## 2014-08-16 DIAGNOSIS — R5382 Chronic fatigue, unspecified: Secondary | ICD-10-CM | POA: Diagnosis present

## 2014-08-16 DIAGNOSIS — Z7982 Long term (current) use of aspirin: Secondary | ICD-10-CM | POA: Diagnosis not present

## 2014-08-16 DIAGNOSIS — E870 Hyperosmolality and hypernatremia: Secondary | ICD-10-CM | POA: Diagnosis present

## 2014-08-16 DIAGNOSIS — R0689 Other abnormalities of breathing: Secondary | ICD-10-CM

## 2014-08-16 DIAGNOSIS — I482 Chronic atrial fibrillation, unspecified: Secondary | ICD-10-CM | POA: Diagnosis present

## 2014-08-16 DIAGNOSIS — J8410 Pulmonary fibrosis, unspecified: Secondary | ICD-10-CM | POA: Diagnosis present

## 2014-08-16 DIAGNOSIS — N179 Acute kidney failure, unspecified: Secondary | ICD-10-CM | POA: Diagnosis present

## 2014-08-16 DIAGNOSIS — R627 Adult failure to thrive: Secondary | ICD-10-CM | POA: Diagnosis present

## 2014-08-16 DIAGNOSIS — R945 Abnormal results of liver function studies: Secondary | ICD-10-CM

## 2014-08-16 DIAGNOSIS — N319 Neuromuscular dysfunction of bladder, unspecified: Secondary | ICD-10-CM | POA: Diagnosis present

## 2014-08-16 DIAGNOSIS — G9340 Encephalopathy, unspecified: Secondary | ICD-10-CM | POA: Diagnosis present

## 2014-08-16 DIAGNOSIS — N189 Chronic kidney disease, unspecified: Secondary | ICD-10-CM | POA: Diagnosis present

## 2014-08-16 DIAGNOSIS — K5900 Constipation, unspecified: Secondary | ICD-10-CM

## 2014-08-16 DIAGNOSIS — J9620 Acute and chronic respiratory failure, unspecified whether with hypoxia or hypercapnia: Secondary | ICD-10-CM | POA: Diagnosis present

## 2014-08-16 DIAGNOSIS — N390 Urinary tract infection, site not specified: Secondary | ICD-10-CM | POA: Diagnosis present

## 2014-08-16 DIAGNOSIS — G8220 Paraplegia, unspecified: Secondary | ICD-10-CM | POA: Diagnosis present

## 2014-08-16 DIAGNOSIS — Z8679 Personal history of other diseases of the circulatory system: Secondary | ICD-10-CM

## 2014-08-16 DIAGNOSIS — K5660 Unspecified intestinal obstruction: Secondary | ICD-10-CM | POA: Diagnosis present

## 2014-08-16 DIAGNOSIS — I252 Old myocardial infarction: Secondary | ICD-10-CM

## 2014-08-16 DIAGNOSIS — K5641 Fecal impaction: Secondary | ICD-10-CM

## 2014-08-16 DIAGNOSIS — Z79891 Long term (current) use of opiate analgesic: Secondary | ICD-10-CM

## 2014-08-16 DIAGNOSIS — Z79899 Other long term (current) drug therapy: Secondary | ICD-10-CM

## 2014-08-16 DIAGNOSIS — Z792 Long term (current) use of antibiotics: Secondary | ICD-10-CM

## 2014-08-16 DIAGNOSIS — Z8249 Family history of ischemic heart disease and other diseases of the circulatory system: Secondary | ICD-10-CM

## 2014-08-16 DIAGNOSIS — J439 Emphysema, unspecified: Secondary | ICD-10-CM | POA: Diagnosis present

## 2014-08-16 DIAGNOSIS — I739 Peripheral vascular disease, unspecified: Secondary | ICD-10-CM | POA: Diagnosis present

## 2014-08-16 DIAGNOSIS — IMO0002 Reserved for concepts with insufficient information to code with codable children: Secondary | ICD-10-CM | POA: Diagnosis present

## 2014-08-16 DIAGNOSIS — I2510 Atherosclerotic heart disease of native coronary artery without angina pectoris: Secondary | ICD-10-CM | POA: Diagnosis present

## 2014-08-16 DIAGNOSIS — K56609 Unspecified intestinal obstruction, unspecified as to partial versus complete obstruction: Secondary | ICD-10-CM | POA: Diagnosis present

## 2014-08-16 DIAGNOSIS — R0600 Dyspnea, unspecified: Secondary | ICD-10-CM | POA: Insufficient documentation

## 2014-08-16 DIAGNOSIS — Z87891 Personal history of nicotine dependence: Secondary | ICD-10-CM | POA: Diagnosis not present

## 2014-08-16 DIAGNOSIS — R112 Nausea with vomiting, unspecified: Secondary | ICD-10-CM | POA: Diagnosis present

## 2014-08-16 LAB — I-STAT CG4 LACTIC ACID, ED
LACTIC ACID, VENOUS: 1.54 mmol/L (ref 0.5–2.0)
Lactic Acid, Venous: 2.05 mmol/L (ref 0.5–2.0)

## 2014-08-16 LAB — COMPREHENSIVE METABOLIC PANEL
ALT: 338 U/L — ABNORMAL HIGH (ref 0–53)
ANION GAP: 10 (ref 5–15)
AST: 465 U/L — ABNORMAL HIGH (ref 0–37)
Albumin: 2.7 g/dL — ABNORMAL LOW (ref 3.5–5.2)
Alkaline Phosphatase: 452 U/L — ABNORMAL HIGH (ref 39–117)
BILIRUBIN TOTAL: 3.7 mg/dL — AB (ref 0.3–1.2)
BUN: 80 mg/dL — AB (ref 6–23)
CALCIUM: 8.2 mg/dL — AB (ref 8.4–10.5)
CHLORIDE: 107 mmol/L (ref 96–112)
CO2: 24 mmol/L (ref 19–32)
Creatinine, Ser: 2.69 mg/dL — ABNORMAL HIGH (ref 0.50–1.35)
GFR calc non Af Amer: 20 mL/min — ABNORMAL LOW (ref >90)
GFR, EST AFRICAN AMERICAN: 24 mL/min — AB (ref >90)
Glucose, Bld: 139 mg/dL — ABNORMAL HIGH (ref 70–99)
Potassium: 4.3 mmol/L (ref 3.5–5.1)
Sodium: 141 mmol/L (ref 135–145)
Total Protein: 7 g/dL (ref 6.0–8.3)

## 2014-08-16 LAB — CBC WITH DIFFERENTIAL/PLATELET
Basophils Absolute: 0 10*3/uL (ref 0.0–0.1)
Basophils Relative: 0 % (ref 0–1)
Eosinophils Absolute: 0 10*3/uL (ref 0.0–0.7)
Eosinophils Relative: 0 % (ref 0–5)
HCT: 33.5 % — ABNORMAL LOW (ref 39.0–52.0)
Hemoglobin: 10.4 g/dL — ABNORMAL LOW (ref 13.0–17.0)
LYMPHS ABS: 0.7 10*3/uL (ref 0.7–4.0)
LYMPHS PCT: 7 % — AB (ref 12–46)
MCH: 28.7 pg (ref 26.0–34.0)
MCHC: 31 g/dL (ref 30.0–36.0)
MCV: 92.5 fL (ref 78.0–100.0)
Monocytes Absolute: 0.4 10*3/uL (ref 0.1–1.0)
Monocytes Relative: 4 % (ref 3–12)
Neutro Abs: 8.2 10*3/uL — ABNORMAL HIGH (ref 1.7–7.7)
Neutrophils Relative %: 89 % — ABNORMAL HIGH (ref 43–77)
Platelets: 250 10*3/uL (ref 150–400)
RBC: 3.62 MIL/uL — ABNORMAL LOW (ref 4.22–5.81)
RDW: 16.7 % — AB (ref 11.5–15.5)
WBC MORPHOLOGY: INCREASED
WBC: 9.3 10*3/uL (ref 4.0–10.5)

## 2014-08-16 LAB — URINALYSIS, ROUTINE W REFLEX MICROSCOPIC
Glucose, UA: NEGATIVE mg/dL
KETONES UR: NEGATIVE mg/dL
Nitrite: POSITIVE — AB
PROTEIN: 100 mg/dL — AB
Specific Gravity, Urine: 1.017 (ref 1.005–1.030)
UROBILINOGEN UA: 1 mg/dL (ref 0.0–1.0)
pH: 5.5 (ref 5.0–8.0)

## 2014-08-16 LAB — URINE MICROSCOPIC-ADD ON

## 2014-08-16 LAB — CBG MONITORING, ED: GLUCOSE-CAPILLARY: 109 mg/dL — AB (ref 70–99)

## 2014-08-16 MED ORDER — INSULIN ASPART 100 UNIT/ML ~~LOC~~ SOLN: 0.0000 [IU] | Freq: Four times a day (QID) | SUBCUTANEOUS | Status: DC

## 2014-08-16 MED ORDER — METRONIDAZOLE IN NACL 5-0.79 MG/ML-% IV SOLN
500.0000 mg | Freq: Three times a day (TID) | INTRAVENOUS | Status: DC
Start: 2014-08-16 — End: 2014-08-18
  Administered 2014-08-17 – 2014-08-18 (×5): 500 mg via INTRAVENOUS
  Filled 2014-08-16 (×5): qty 100

## 2014-08-16 MED ORDER — ONDANSETRON HCL 4 MG PO TABS: 4.0000 mg | ORAL_TABLET | Freq: Four times a day (QID) | ORAL | Status: DC | PRN

## 2014-08-16 MED ORDER — SODIUM CHLORIDE 0.9 % IJ SOLN
3.0000 mL | Freq: Two times a day (BID) | INTRAMUSCULAR | Status: DC
  Administered 2014-08-17 – 2014-08-20 (×6): 3 mL via INTRAVENOUS

## 2014-08-16 MED ORDER — ACETAMINOPHEN 650 MG RE SUPP
650.0000 mg | Freq: Once | RECTAL | Status: AC
  Administered 2014-08-16: 650 mg via RECTAL
  Filled 2014-08-16: qty 1

## 2014-08-16 MED ORDER — SODIUM CHLORIDE 0.9 % IV BOLUS (SEPSIS)
1000.0000 mL | INTRAVENOUS | Status: AC
  Administered 2014-08-16 (×2): 1000 mL via INTRAVENOUS

## 2014-08-16 MED ORDER — ONDANSETRON HCL 4 MG/2ML IJ SOLN: 4.0000 mg | Freq: Four times a day (QID) | INTRAMUSCULAR | Status: DC | PRN

## 2014-08-16 MED ORDER — PIPERACILLIN-TAZOBACTAM 3.375 G IVPB 30 MIN
3.3750 g | Freq: Once | INTRAVENOUS | Status: AC
  Administered 2014-08-16: 3.375 g via INTRAVENOUS
  Filled 2014-08-16: qty 50

## 2014-08-16 MED ORDER — HEPARIN SODIUM (PORCINE) 5000 UNIT/ML IJ SOLN
5000.0000 [IU] | Freq: Three times a day (TID) | INTRAMUSCULAR | Status: DC
  Administered 2014-08-17 – 2014-08-20 (×11): 5000 [IU] via SUBCUTANEOUS
  Filled 2014-08-16 (×15): qty 1

## 2014-08-16 MED ORDER — VANCOMYCIN HCL IN DEXTROSE 1-5 GM/200ML-% IV SOLN
1000.0000 mg | Freq: Once | INTRAVENOUS | Status: AC
  Administered 2014-08-16: 1000 mg via INTRAVENOUS
  Filled 2014-08-16: qty 200

## 2014-08-16 MED ORDER — INSULIN ASPART 100 UNIT/ML ~~LOC~~ SOLN
0.0000 [IU] | Freq: Four times a day (QID) | SUBCUTANEOUS | Status: DC
Start: 2014-08-16 — End: 2014-08-16

## 2014-08-16 MED ORDER — PIPERACILLIN-TAZOBACTAM IN DEX 2-0.25 GM/50ML IV SOLN
2.2500 g | Freq: Three times a day (TID) | INTRAVENOUS | Status: DC
  Administered 2014-08-17: 2.25 g via INTRAVENOUS
  Filled 2014-08-16 (×2): qty 50

## 2014-08-16 MED ORDER — SODIUM CHLORIDE 0.9 % IV BOLUS (SEPSIS)
500.0000 mL | INTRAVENOUS | Status: AC
  Administered 2014-08-16: 500 mL via INTRAVENOUS

## 2014-08-16 MED ORDER — VANCOMYCIN HCL IN DEXTROSE 1-5 GM/200ML-% IV SOLN: 1000.0000 mg | INTRAVENOUS | Status: DC

## 2014-08-16 NOTE — ED Provider Notes (Signed)
CSN: [id number]     Arrival date & time 2014-12-28  1905 History   First MD Initiated Contact with Patient 02016-06-29 1911     Chief Complaint  Patient presents with  . Shortness of Breath  . Altered Mental Status     (Consider location/radiation/quality/duration/timing/severity/associated sxs/prior Treatment) HPI Comments: Patient here with altered mental status from nursing home as well as increased shortness of breath. Patient does have a most form was specified that he will receive IV fluids antibiotics. 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 According to the wife he has been sick for the past few days. Does have a chronic UTI. No reported vomiting or diarrhea. EMS called transported here. No treatment use prior to arrival  The history is provided by medical records and the spouse. The history is limited by the condition of the patient.    Past Medical History  Diagnosis Date  . Peripheral vascular disease     critical limb ischemia  . Myocardial infarction   . Diabetes mellitus without complication   . Hypertension   . Coronary artery disease   . Atrial fibrillation   . Aortic dissection     w/ Cardiac tamponade  . AVM (arteriovenous malformation)   . Murmur   . Hyperlipidemia   . Neuropathy of left lower extremity   . S/P CABG (coronary artery bypass graft)    Past Surgical History  Procedure Laterality Date  . Coronary artery bypass graft  02/11/2005    LIMA to LAD, SVG to 1st diag, SVG to OM1, and SVG to PD  . Back surgery    . Atherectomy  09/15/2012    Diamondback orbital rotation atherectomy was performed using a 2 mm solid crown up to a max of 120,000 RPM. Angioplasty was performed using a 5x17420mm long chocolate balloon for 2 minute inflations. Resulting in reduction of an 80% calcified distal R SFA stenosis to less than 20%  . Lea doppler  10/01/2012    R SFA demonstrated a mild amount of residual plaque suggesting less then 50% diametere  reduction. R Calf runoff-one vessel runoff via peroneal artery. The posterior and anterior tibial arteries appeared occluded.  . Cardiac catheterization  02/04/2005    Severe 3-vessel disease affecting LAD and RCA. Consider CABG  . Cardiovascular stress test  12/16/2006    EKG negative for ischemia. No ECG changes. No significant ischemia demonstrated  . Transthoracic echocardiogram  12/04/2011    EF 50-55%, moderate aortic root dilation  . Atherectomy N/A 09/15/2012    Procedure: ATHERECTOMY;  Surgeon: Runell GessJonathan J Berry, MD;  [facility];  Service: Cardiovascular;  Laterality: N/A;   Family History  Problem Relation Age of Onset  . Hypertension Father    History  Substance Use Topics  . Smoking status: Former Smoker    Quit date: 06/20/1992  . Smokeless tobacco: Never Used  . Alcohol Use: Yes     Comment: 1 glass red wine each night    Review of Systems  Unable to perform ROS     Allergies  Cymbalta and Statins  Home Medications   Prior to Admission medications   Medication Sig Start Date End Date Taking? Authorizing Provider  amLODipine (NORVASC) 2.5 MG tablet Take 3 tablets (7.5 mg total) by mouth daily. 06/30/14   Catarina Hartshornavid Tat, MD  aspirin 81 MG tablet Take 81 mg by mouth daily with breakfast.     Historical Provider, MD  calcium carbonate (OS-CAL - DOSED IN  MG OF ELEMENTAL CALCIUM) 1250 MG tablet Take 1 tablet by mouth 2 (two) times daily with a meal.     Historical Provider, MD  docusate sodium 100 MG CAPS Take 100 mg by mouth 2 (two) times daily. 06/30/14   Catarina Hartshorn, MD  ezetimibe (ZETIA) 10 MG tablet Take 1 tablet (10 mg total) by mouth daily. Patient taking differently: Take 10 mg by mouth daily with breakfast.  12/29/12   Runell Gess, MD  ferrous sulfate 325 (65 FE) MG tablet Take 1 tablet (325 mg total) by mouth daily with breakfast. 05/21/14   Maryann Mikhail, DO  furosemide (LASIX) 20 MG tablet Take 1 tablet (20 mg total) by mouth daily. 07/01/14   Catarina Hartshorn,  MD  gabapentin (NEURONTIN) 300 MG capsule Take 1 capsule (300 mg total) by mouth 2 (two) times daily. 06/30/14   Catarina Hartshorn, MD  insulin aspart (NOVOLOG) 100 UNIT/ML FlexPen Inject 7 Units into the skin 3 (three) times daily with meals. 07/05/14   Nishant Dhungel, MD  insulin glargine (LANTUS) 100 UNIT/ML injection Inject 0.24 mLs (24 Units total) into the skin daily with breakfast. 07/05/14   Nishant Dhungel, MD  metoprolol (LOPRESSOR) 50 MG tablet Take 50 mg by mouth 2 (two) times daily. 10/28/13   Lennette Bihari, MD  MILK THISTLE PO Take 1 tablet by mouth daily with breakfast.    Historical Provider, MD  Multiple Vitamin (MULTIVITAMIN WITH MINERALS) TABS tablet Take 1 tablet by mouth daily.    Historical Provider, MD  Omega-3 Fatty Acids (FISH OIL PO) Take 1 capsule by mouth 2 (two) times daily.    Historical Provider, MD  oxyCODONE-acetaminophen (PERCOCET/ROXICET) 5-325 MG per tablet Take 1-2 tablets by mouth See admin instructions. 2 tabs every morning, 1 tab in the afternoon and 2 tabs at bedtime 07/05/14   Nishant Dhungel, MD  potassium chloride (K-DUR,KLOR-CON) 10 MEQ tablet Take 1 tablet (10 mEq total) by mouth daily. 06/30/14   Catarina Hartshorn, MD  predniSONE (DELTASONE) 20 MG tablet Take 2 tablets (40 mg total) by mouth daily with breakfast. Patient not taking: Reported on 07/01/2014 06/30/14   Catarina Hartshorn, MD  Probiotic Product (PROBIOTIC DAILY) CAPS Take 1 capsule by mouth every evening.    Historical Provider, MD   BP 117/63 mmHg  Pulse 108  Temp(Src) 100.3 F (37.9 C) (Oral)  Resp 22  SpO2 100% Physical Exam  Constitutional: He appears lethargic. He appears toxic. He has a sickly appearance. He appears ill. He appears distressed.  HENT:  Head: Normocephalic and atraumatic.  Eyes: Conjunctivae, EOM and lids are normal. Pupils are equal, round, and reactive to light.  Neck: Normal range of motion. Neck supple. No tracheal deviation present. No thyroid mass present.  Cardiovascular: Regular  rhythm and normal heart sounds.  Tachycardia present.  Exam reveals no gallop.   No murmur heard. Pulmonary/Chest: Effort normal. No stridor. No respiratory distress. He has decreased breath sounds. He has wheezes. He has no rhonchi. He has no rales.  Abdominal: Soft. Normal appearance and bowel sounds are normal. He exhibits no distension. There is no tenderness. There is no rebound and no CVA tenderness.  Musculoskeletal: Normal range of motion. He exhibits no edema or tenderness.  Neurological: He appears lethargic. No cranial nerve deficit. GCS eye subscore is 4. GCS verbal subscore is 3. GCS motor subscore is 5.  Skin: Skin is warm. No abrasion and no rash noted. He is diaphoretic.  Psychiatric: He has a normal mood and  affect. His speech is normal and behavior is normal.  Nursing note and vitals reviewed.   ED Course  Procedures (including critical care time) Labs Review Labs Reviewed  CULTURE, BLOOD (ROUTINE X 2)  CULTURE, BLOOD (ROUTINE X 2)  URINE CULTURE  CBC WITH DIFFERENTIAL/PLATELET  COMPREHENSIVE METABOLIC PANEL  URINALYSIS, ROUTINE W REFLEX MICROSCOPIC  I-STAT CG4 LACTIC ACID, ED    Imaging Review No results found.   EKG Interpretation   Date/Time:  Tuesday August 19, 2014 19:12:30 EST Ventricular Rate:  108 PR Interval:  58 QRS Duration: 99 QT Interval:  490 QTC Calculation: 657 R Axis:   -57 Text Interpretation:  Sinus tachycardia Left anterior fascicular block  Nonspecific repol abnormality, diffuse leads Prolonged QT interval  Confirmed by Freida Busman  MD, Akeya Ryther ([id number]) on 08/19/2014 7:25:42 PM    me:  Tuesday 2014-08-19 19:12:30 EST Ventricular Rate:  108 PR Interval:  58 QRS Duration: 99 QT Interval:  490 QTC Calculation: 657 R Axis:   -57 Text Interpretation:  Sinus tachycardia Left anterior fascicular block  Nonspecific repol abnormality, diffuse leads Prolonged QT interval  Confirmed by Freida Busman  MD, Bradee Common ([id number]) on 08/19/14 7:25:42 PM       MDM   Final diagnoses:  None   Patient started on IV antibiotics for presumptive sepsis. Old records show that patient has a bowel obstruction. He also has very infected urine. Discussed with the wife and patient will be comfort measures with IV fluids and antibiotics only. Discuss with Dr. Allena Katz, he will admit the patient    Toy Baker, MD 19-Aug-2014 2130

## 2014-08-16 NOTE — H&P (Signed)
[facility] History and Physical  Patient: [name] [name]  MRN: [id number]  DOB: 1929-06-28  DOS: the patient was seen and examined on Sep 10, 2014 PCP: [name], MD  Chief Complaint: Nausea vomiting and change in mental status  HPI: [name] [name] is a 81 y.o. male with Past medical history of peripheral vascular disease, diabetes mellitus, hypertension, atrial fibrillation, aortic dissection history, bilateral lower leg neuropathy, status post CABG, chronic Foley catheter, chronic kidney disease. The patient presents with complaints of nausea and vomiting. The history was up and from patient's wife. Patient was recently hospitalized in January and was discharged to [facility]. Patient has been working with physical therapy there and has been fairly active with physical therapy with ambulation as well as coordination with biking. Since last one week the patient has been having increasingly severe spasm in his lower leg and has been getting regular OxyContin. Patient also had increase in his gabapentin twice a day to 4 times a day. Patient has been confused and not acting himself since last 2-3 days. He was started on ceftriaxone in the nursing home. Last night the patient had multiple semisoft bowel movements without any blood. This afternoon the patient started having vomiting. At the time of my evaluation patient was drowsy and lethargic but arousable. Patient did not have any complaints of chest pain per wife. No fall no trauma no injury reported. Patient has a most form, which mentions comfort measures only, DNR/DNI, use of antibiotics as needed and IV fluids as needed and does not want any feeding tube.  The patient is coming from nursing facility. And at his baseline dependent for most of his ADL.  Review of Systems: as mentioned in the history of present illness.  A Comprehensive review of the other systems is negative.  Past Medical History   Diagnosis Date  . Peripheral vascular disease     critical limb ischemia  . Myocardial infarction   . Diabetes mellitus without complication   . Hypertension   . Coronary artery disease   . Atrial fibrillation   . Aortic dissection     w/ Cardiac tamponade  . AVM (arteriovenous malformation)   . Murmur   . Hyperlipidemia   . Neuropathy of left lower extremity   . S/P CABG (coronary artery bypass graft)    Past Surgical History  Procedure Laterality Date  . Coronary artery bypass graft  02/11/2005    LIMA to LAD, SVG to 1st diag, SVG to OM1, and SVG to PD  . Back surgery    . Atherectomy  09/15/2012    Diamondback orbital rotation atherectomy was performed using a 2 mm solid crown up to a max of 120,000 RPM. Angioplasty was performed using a 5x18mm long chocolate balloon for 2 minute inflations. Resulting in reduction of an 80% calcified distal R SFA stenosis to less than 20%  . Lea doppler  10/01/2012    R SFA demonstrated a mild amount of residual plaque suggesting less then 50% diametere reduction. R Calf runoff-one vessel runoff via peroneal artery. The posterior and anterior tibial arteries appeared occluded.  . Cardiac catheterization  02/04/2005    Severe 3-vessel disease affecting LAD and RCA. Consider CABG  . Cardiovascular stress test  12/16/2006    EKG negative for ischemia. No ECG changes. No significant ischemia demonstrated  . Transthoracic echocardiogram  12/04/2011    EF 50-55%, moderate aortic root dilation  . Atherectomy N/A 09/15/2012    Procedure: ATHERECTOMY;  Surgeon: Christiane Ha  Erlene Quan, MD;  Location: [facility] CATH LAB;  Service: Cardiovascular;  Laterality: N/A;   Social History:  reports that he quit smoking about 22 years ago. He has never used smokeless tobacco. He reports that he does not drink alcohol or use illicit drugs.  Allergies  Allergen Reactions  . Cymbalta [Duloxetine Hcl] Other (See Comments)    Confused, paralyzed from waist up   . Statins Other (See  Comments)    Leg pains    Family History  Problem Relation Age of Onset  . Hypertension Father     Prior to Admission medications   Medication Sig Start Date End Date Taking? Authorizing Provider  amLODipine (NORVASC) 2.5 MG tablet Take 3 tablets (7.5 mg total) by mouth daily. 06/30/14  Yes Catarina Hartshorn, MD  aspirin 81 MG tablet Take 81 mg by mouth daily with breakfast.    Yes Historical Provider, MD  calcium carbonate (OS-CAL - DOSED IN MG OF ELEMENTAL CALCIUM) 1250 MG tablet Take 1 tablet by mouth 2 (two) times daily with a meal.    Yes Historical Provider, MD  cefTRIAXone (ROCEPHIN) 1 G injection Inject 1 g into the vein daily. For 2 Days.   Yes Historical Provider, MD  ezetimibe (ZETIA) 10 MG tablet Take 1 tablet (10 mg total) by mouth daily. Patient taking differently: Take 10 mg by mouth daily with breakfast.  12/29/12  Yes Runell Gess, MD  ferrous sulfate 325 (65 FE) MG tablet Take 1 tablet (325 mg total) by mouth daily with breakfast. 05/21/14  Yes Maryann Mikhail, DO  furosemide (LASIX) 20 MG tablet Take 1 tablet (20 mg total) by mouth daily. 07/01/14  Yes Catarina Hartshorn, MD  gabapentin (NEURONTIN) 300 MG capsule Take 1 capsule (300 mg total) by mouth 2 (two) times daily. Patient taking differently: Take 300 mg by mouth 4 (four) times daily.  06/30/14  Yes Catarina Hartshorn, MD  insulin aspart (NOVOLOG) 100 UNIT/ML FlexPen Inject 7 Units into the skin 3 (three) times daily with meals. 07/05/14  Yes Nishant Dhungel, MD  Insulin Detemir (LEVEMIR) 100 UNIT/ML Pen Inject 30 Units into the skin every morning.   Yes Historical Provider, MD  methocarbamol (ROBAXIN) 500 MG tablet Take 500 mg by mouth 3 (three) times daily as needed for muscle spasms (muscle spasms).   Yes Historical Provider, MD  metoprolol (LOPRESSOR) 50 MG tablet Take 50 mg by mouth 2 (two) times daily. 10/28/13  Yes Lennette Bihari, MD  Multiple Vitamin (MULTIVITAMIN WITH MINERALS) TABS tablet Take 1 tablet by mouth daily.   Yes Historical  Provider, MD  multivitamin-iron-minerals-folic acid (THERAPEUTIC-M) TABS tablet Take 1 tablet by mouth daily.   Yes Historical Provider, MD  Omega-3 Fatty Acids (FISH OIL PO) Take 1 capsule by mouth 2 (two) times daily.   Yes Historical Provider, MD  Ondansetron HCl (ZOFRAN IV) Inject 4 mg into the vein daily as needed (nausea and vomiting).    Yes Historical Provider, MD  oxyCODONE-acetaminophen (PERCOCET/ROXICET) 5-325 MG per tablet Take 1-2 tablets by mouth See admin instructions. 2 tabs every morning, 1 tab in the afternoon and 2 tabs at bedtime Patient taking differently: Take 1-2 tablets by mouth every 4 (four) hours as needed.  07/05/14  Yes Nishant Dhungel, MD  potassium chloride (K-DUR,KLOR-CON) 10 MEQ tablet Take 1 tablet (10 mEq total) by mouth daily. 06/30/14  Yes Catarina Hartshorn, MD  Probiotic Product (PROBIOTIC DAILY) CAPS Take 1 capsule by mouth every evening.   Yes Historical Provider, MD  vitamin C (ASCORBIC ACID) 500 MG tablet Take 500 mg by mouth daily.   Yes Historical Provider, MD  docusate sodium 100 MG CAPS Take 100 mg by mouth 2 (two) times daily. Patient not taking: Reported on 08/05/2014 06/30/14   Catarina Hartshorn, MD  insulin glargine (LANTUS) 100 UNIT/ML injection Inject 0.24 mLs (24 Units total) into the skin daily with breakfast. Patient not taking: Reported on 08/04/2014 07/05/14   Nishant Dhungel, MD  omeprazole (PRILOSEC) 40 MG capsule Take 40 mg by mouth daily.    Historical Provider, MD  predniSONE (DELTASONE) 20 MG tablet Take 2 tablets (40 mg total) by mouth daily with breakfast. Patient not taking: Reported on 07/01/2014 06/30/14   Catarina Hartshorn, MD    Physical Exam: Filed Vitals:   08/09/2014 2056 08/18/2014 2100 08/10/2014 2140 08/20/2014 2210  BP:  157/72 134/70   Pulse:      Temp:    99.1 F (37.3 C)  TempSrc:    Rectal  Resp:  16 14   Height: 6' (1.829 m)     Weight: 71.668 kg (158 lb)     SpO2:        General: Drowsy but arousable and follows command, Appear in moderate  distress Eyes: PERRL ENT: Oral Mucosa clear moist. Neck: no JVD Cardiovascular: S1 and S2 Present, no Murmur, Peripheral Pulses Present Respiratory: Bilateral Air entry equal and Decreased, bilateral Crackles, occasional expiratory wheezes Abdomen: Bowel Sound present, Soft Skin: no Rash Extremities: no Pedal edema,  Neurologic: Drowsy but arousable  Labs on Admission:  CBC:  Recent Labs Lab 08/03/2014 1917  WBC 9.3  NEUTROABS 8.2*  HGB 10.4*  HCT 33.5*  MCV 92.5  PLT 250    CMP     Component Value Date/Time   NA 141 08/03/2014 1917   K 4.3 08/23/2014 1917   CL 107 08/24/2014 1917   CO2 24 08/06/2014 1917   GLUCOSE 139* 08/18/2014 1917   BUN 80* 08/10/2014 1917   CREATININE 2.69* 08/28/2014 1917   CREATININE 1.61* 01/21/2014 1146   CALCIUM 8.2* 08/11/2014 1917   PROT 7.0 08/09/2014 1917   ALBUMIN 2.7* 08/04/2014 1917   AST 465* 08/07/2014 1917   ALT 338* 08/02/2014 1917   ALKPHOS 452* 08/03/2014 1917   BILITOT 3.7* 08/12/2014 1917   GFRNONAA 20* 08/20/2014 1917   GFRAA 24* 08/26/2014 1917    No results for input(s): LIPASE, AMYLASE in the last 168 hours.  No results for input(s): CKTOTAL, CKMB, CKMBINDEX, TROPONINI in the last 168 hours. BNP (last 3 results)  Recent Labs  06/29/14 0410 06/30/14 0415  BNP 283.2* 117.3*    ProBNP (last 3 results)  Recent Labs  05/18/14 1410 06/20/14 1202  PROBNP 4183.0* 14250.0*     Radiological Exams on Admission: Dg Chest Port 1 View  08/12/2014   CLINICAL DATA:  Shortness of breath and altered mental status.  EXAM: PORTABLE CHEST - 1 VIEW  COMPARISON:  CT chest 06/20/2014. Plain films of the chest 07/01/2014 and 06/27/2014.  FINDINGS: Extensive pulmonary fibrosis and emphysema are again seen. No new airspace disease identified. Heart size is normal. No pneumothorax or pleural fluid is identified.  IMPRESSION: No acute finding in patient with extensive pulmonary fibrosis and emphysema.   Electronically Signed   By:  Drusilla Kanner M.D.   On: 08/06/2014 19:51   EKG: Independently reviewed. sinus tachycardia.  Assessment/Plan Principal Problem:   Sepsis Active Problems:   CAD - CABG x 4 in 2006   Neurogenic bladder  Pulmonary fibrosis   H/O aortic dissection   Acute encephalopathy   Chronic atrial fibrillation   Diabetes mellitus type 2, uncontrolled   Acute and chronic respiratory failure (acute-on-chronic)   Bowel obstruction   LFT elevation   1. Sepsis  UTI, acute respiratory failure, acute encephalopathy, elevated LFT Biliary sludge,  The patient is presenting with multiple complaints. Due to complains of nausea vomiting and abdominal distention and x-ray was performed at the outside facility which showed that the patient has markedly gastric distention and has signs of colonic obstruction. He is urine appears to be significantly infected. Patient is significantly hypoxic requiring nonrebreather to maintain saturations are 95-96%. With that his chest x-ray is showing significant pulmonary fibrosis without any acute changes. Patient does not have any leukocytosis but has had smaller than 20%. Lactic acidosis is not present but patient has elevated LFTs. Patient currently has adequate blood pressure and appears tachycardic with sinus tachycardia. Patient is significantly altered from his baseline. With all this findings the patient appears to be having sepsis secondary to UTI. I'll discuss the pneumonia as well as possible intra-abdominal infection cannot be ruled out. Patient did have some diarrhea patient was recently on the antibiotic and therefore possibility of C. difficile cannot be ruled out as well. Case was discussed with general surgery will be following up with the patient. Patient's most form mentions about comfort measures with IV fluids and antibiotics without any other aggressive measures. This was reconfirmed with wife was at bedside. Patient's wife requested to go ahead  with gastric tube after understanding available options for comfort. Patient was not able to tolerate the insertion. Patient will remain nothing by mouth at present. Patient will be treated with vancomycin Zosyn and Flagyl. C. difficile PCR and entry precaution. Solu-Medrol 60 every 12 and DuoNeb's. Follow cultures.  2. Chronic pain, neuropathy,. The patient has chronic pain and neuropathy. Patient was visiting multiple narcotics and increasing dose of gabapentin. Due to ongoing altered mental sensorium with significant hypoxia I will be holding all sedating and psychotropic medications. Should the wife decided for complete comfort measures the patient will be placed on medications to provide comfort. This was discussed with wife who preferred to monitor the patient with current treatment plan at present.  3. Chronic kidney disease. Chronic Foley catheter. The patient has chronic Foley catheter secondary to neurogenic bladder. Serum creatinine stable. Pharmacy consultation for renal dosing of medication.  4. Diabetes mellitus. Holding oral hypoglycemic agents and placing patient on sliding scale.  Advance goals of care discussion: DNR/DNI, wife was at bedside and requested to continue with IV antibiotics and IV fluids for treatment.  Palliative care consulted to discuss goals of care.  Consults: Phone consultation with radiology  DVT Prophylaxis: subcutaneous Heparin Nutrition: Nothing by mouth  Family Communication: Wife was present at bedside, opportunity was given to ask question and all questions were answered satisfactorily at the time of interview. Disposition: Admitted to inpatient in step-down unit.  Author: Lynden OxfordPranav Harneet Noblett, MD [facility] Pager: [phone] 08/11/2014, 10:54 PM    If 7PM-7AM, please contact night-coverage [email or web address] Password [email or web address]

## 2014-08-16 NOTE — ED Notes (Signed)
[facility] EMS [facility] Altered mental status and shortness of breath that has gotten progressively worse over the past few days.

## 2014-08-16 NOTE — Progress Notes (Signed)
ANTIBIOTIC CONSULT NOTE - INITIAL  Pharmacy Consult for Vancomycin / Zosyn Indication: Sepsis  Allergies  Allergen Reactions  . Cymbalta [Duloxetine Hcl] Other (See Comments)    Confused, paralyzed from waist up   . Statins Other (See Comments)    Leg pains    Patient Measurements:   Adjusted Body Weight:   Vital Signs: Temp: 100.3 F (37.9 C) (02/16 1916) Temp Source: Oral (02/16 1916) BP: 117/63 mmHg (02/16 1916) Pulse Rate: 108 (02/16 1916) Intake/Output from previous day:   Intake/Output from this shift:    Labs: No results for input(s): WBC, HGB, PLT, LABCREA, CREATININE in the last 72 hours. CrCl cannot be calculated (Unknown ideal weight.). No results for input(s): VANCOTROUGH, VANCOPEAK, VANCORANDOM, GENTTROUGH, GENTPEAK, GENTRANDOM, TOBRATROUGH, TOBRAPEAK, TOBRARND, AMIKACINPEAK, AMIKACINTROU, AMIKACIN in the last 72 hours.   Microbiology: No results found for this or any previous visit (from the past 720 hour(s)).  Medical History: Past Medical History  Diagnosis Date  . Peripheral vascular disease     critical limb ischemia  . Myocardial infarction   . Diabetes mellitus without complication   . Hypertension   . Coronary artery disease   . Atrial fibrillation   . Aortic dissection     w/ Cardiac tamponade  . AVM (arteriovenous malformation)   . Murmur   . Hyperlipidemia   . Neuropathy of left lower extremity   . S/P CABG (coronary artery bypass graft)    Assessment: [age] yoM with AMS and progressive SOB from NH.  Pharmacy consulted to start vancomycin and zosyn for sepsis. First doses ordered in ED.    2/16 >> Vancomcyin  >> 2/16 >> Zosyn >>    Tmax: 100.3 WBCs: 9.3K Renal: CKD-III - SCr 2.69, CrCl ~ 21 ml/min CG/N  2/16 blood: ordered 2/16 urine: ordered   Drug level / dose changes info: 1/5: random vanc 16.6 on vanc 1g q48h, SCr 3.4 -->2.0  Goal of Therapy:  Vancomycin trough level 15-20 mcg/ml Eradication of infection  Plan:   Vancomycin 1g IV q48h Zosyn 2.25g IV q8h F/u renal fxn to adjust doses

## 2014-08-17 ENCOUNTER — Inpatient Hospital Stay (HOSPITAL_COMMUNITY): Payer: Medicare Other

## 2014-08-17 DIAGNOSIS — Z515 Encounter for palliative care: Secondary | ICD-10-CM

## 2014-08-17 DIAGNOSIS — G894 Chronic pain syndrome: Secondary | ICD-10-CM

## 2014-08-17 LAB — CBC WITH DIFFERENTIAL/PLATELET
Basophils Absolute: 0 10*3/uL (ref 0.0–0.1)
Basophils Relative: 0 % (ref 0–1)
EOS ABS: 0 10*3/uL (ref 0.0–0.7)
EOS PCT: 0 % (ref 0–5)
HEMATOCRIT: 33.7 % — AB (ref 39.0–52.0)
HEMOGLOBIN: 10.6 g/dL — AB (ref 13.0–17.0)
LYMPHS ABS: 0.6 10*3/uL — AB (ref 0.7–4.0)
Lymphocytes Relative: 5 % — ABNORMAL LOW (ref 12–46)
MCH: 28.9 pg (ref 26.0–34.0)
MCHC: 31.5 g/dL (ref 30.0–36.0)
MCV: 91.8 fL (ref 78.0–100.0)
MONO ABS: 0.4 10*3/uL (ref 0.1–1.0)
MONOS PCT: 3 % (ref 3–12)
Neutro Abs: 11.2 10*3/uL — ABNORMAL HIGH (ref 1.7–7.7)
Neutrophils Relative %: 92 % — ABNORMAL HIGH (ref 43–77)
Platelets: 222 10*3/uL (ref 150–400)
RBC: 3.67 MIL/uL — AB (ref 4.22–5.81)
RDW: 17 % — ABNORMAL HIGH (ref 11.5–15.5)
WBC: 12.3 10*3/uL — ABNORMAL HIGH (ref 4.0–10.5)

## 2014-08-17 LAB — COMPREHENSIVE METABOLIC PANEL
ALBUMIN: 2.4 g/dL — AB (ref 3.5–5.2)
ALT: 344 U/L — ABNORMAL HIGH (ref 0–53)
AST: 389 U/L — AB (ref 0–37)
Alkaline Phosphatase: 426 U/L — ABNORMAL HIGH (ref 39–117)
Anion gap: 13 (ref 5–15)
BUN: 71 mg/dL — ABNORMAL HIGH (ref 6–23)
CHLORIDE: 115 mmol/L — AB (ref 96–112)
CO2: 19 mmol/L (ref 19–32)
CREATININE: 2.4 mg/dL — AB (ref 0.50–1.35)
Calcium: 8 mg/dL — ABNORMAL LOW (ref 8.4–10.5)
GFR calc Af Amer: 27 mL/min — ABNORMAL LOW (ref >90)
GFR calc non Af Amer: 23 mL/min — ABNORMAL LOW (ref >90)
Glucose, Bld: 48 mg/dL — ABNORMAL LOW (ref 70–99)
Potassium: 4.7 mmol/L (ref 3.5–5.1)
SODIUM: 147 mmol/L — AB (ref 135–145)
Total Bilirubin: 4 mg/dL — ABNORMAL HIGH (ref 0.3–1.2)
Total Protein: 6.4 g/dL (ref 6.0–8.3)

## 2014-08-17 LAB — GLUCOSE, CAPILLARY
GLUCOSE-CAPILLARY: 138 mg/dL — AB (ref 70–99)
GLUCOSE-CAPILLARY: 48 mg/dL — AB (ref 70–99)
Glucose-Capillary: 54 mg/dL — ABNORMAL LOW (ref 70–99)
Glucose-Capillary: 81 mg/dL (ref 70–99)
Glucose-Capillary: 44 mg/dL — CL (ref 70–99)
Glucose-Capillary: 82 mg/dL (ref 70–99)
Glucose-Capillary: 106 mg/dL — ABNORMAL HIGH (ref 70–99)
Glucose-Capillary: 98 mg/dL (ref 70–99)
Glucose-Capillary: 108 mg/dL — ABNORMAL HIGH (ref 70–99)

## 2014-08-17 LAB — MRSA PCR SCREENING: MRSA BY PCR: POSITIVE — AB

## 2014-08-17 LAB — LACTIC ACID, PLASMA: Lactic Acid, Venous: 1.9 mmol/L (ref 0.5–2.0)

## 2014-08-17 LAB — PROTIME-INR
INR: 1.37 (ref 0.00–1.49)
PROTHROMBIN TIME: 17 s — AB (ref 11.6–15.2)

## 2014-08-17 MED ORDER — FENTANYL CITRATE 0.05 MG/ML IJ SOLN
12.5000 ug | INTRAMUSCULAR | Status: DC | PRN
  Administered 2014-08-17 – 2014-08-19 (×8): 12.5 ug via INTRAVENOUS
  Filled 2014-08-17 (×8): qty 2

## 2014-08-17 MED ORDER — METHYLPREDNISOLONE SODIUM SUCC 125 MG IJ SOLR
60.0000 mg | Freq: Two times a day (BID) | INTRAMUSCULAR | Status: DC
  Administered 2014-08-17: 60 mg via INTRAVENOUS
  Filled 2014-08-17: qty 2

## 2014-08-17 MED ORDER — MUPIROCIN 2 % EX OINT
1.0000 "application " | TOPICAL_OINTMENT | Freq: Two times a day (BID) | CUTANEOUS | Status: DC
  Administered 2014-08-17 – 2014-08-20 (×7): 1 via NASAL
  Filled 2014-08-17: qty 22

## 2014-08-17 MED ORDER — INSULIN ASPART 100 UNIT/ML ~~LOC~~ SOLN
0.0000 [IU] | SUBCUTANEOUS | Status: DC
  Administered 2014-08-17: 2 [IU] via SUBCUTANEOUS
  Administered 2014-08-18: 8 [IU] via SUBCUTANEOUS
  Administered 2014-08-18: 2 [IU] via SUBCUTANEOUS
  Administered 2014-08-18: 3 [IU] via SUBCUTANEOUS
  Administered 2014-08-18: 2 [IU] via SUBCUTANEOUS
  Administered 2014-08-18: 3 [IU] via SUBCUTANEOUS
  Administered 2014-08-19: 11 [IU] via SUBCUTANEOUS
  Administered 2014-08-19: 3 [IU] via SUBCUTANEOUS

## 2014-08-17 MED ORDER — METOPROLOL TARTRATE 12.5 MG HALF TABLET
12.5000 mg | ORAL_TABLET | Freq: Two times a day (BID) | ORAL | Status: DC
  Administered 2014-08-17 (×2): 12.5 mg via ORAL
  Filled 2014-08-17 (×3): qty 1

## 2014-08-17 MED ORDER — PIPERACILLIN-TAZOBACTAM 3.375 G IVPB
3.3750 g | Freq: Three times a day (TID) | INTRAVENOUS | Status: DC
  Administered 2014-08-17 – 2014-08-20 (×10): 3.375 g via INTRAVENOUS
  Filled 2014-08-17 (×10): qty 50

## 2014-08-17 MED ORDER — DEXTROSE 50 % IV SOLN
INTRAVENOUS | Status: AC
  Administered 2014-08-17: 50 mL
  Filled 2014-08-17: qty 50

## 2014-08-17 MED ORDER — MORPHINE SULFATE 2 MG/ML IJ SOLN: 1.0000 mg | INTRAMUSCULAR | Status: DC | PRN

## 2014-08-17 MED ORDER — DEXTROSE 50 % IV SOLN
INTRAVENOUS | Status: AC
Start: 2014-08-17 — End: 2014-08-17
  Administered 2014-08-17: 50 mL
  Filled 2014-08-17: qty 50

## 2014-08-17 MED ORDER — SODIUM CHLORIDE 0.9 % IV SOLN
INTRAVENOUS | Status: DC
  Administered 2014-08-17: 01:00:00 via INTRAVENOUS

## 2014-08-17 MED ORDER — CHLORHEXIDINE GLUCONATE CLOTH 2 % EX PADS
6.0000 | MEDICATED_PAD | Freq: Every day | CUTANEOUS | Status: DC
  Administered 2014-08-18 – 2014-08-20 (×3): 6 via TOPICAL

## 2014-08-17 MED ORDER — FENTANYL CITRATE 0.05 MG/ML IJ SOLN
50.0000 ug | Freq: Once | INTRAMUSCULAR | Status: AC
  Administered 2014-08-17: 50 ug via INTRAVENOUS
  Filled 2014-08-17: qty 2

## 2014-08-17 MED ORDER — DEXTROSE 50 % IV SOLN
INTRAVENOUS | Status: AC
  Administered 2014-08-17: 10:00:00
  Filled 2014-08-17: qty 50

## 2014-08-17 MED ORDER — IPRATROPIUM-ALBUTEROL 0.5-2.5 (3) MG/3ML IN SOLN: 3.0000 mL | RESPIRATORY_TRACT | Status: DC | PRN

## 2014-08-17 MED ORDER — KCL IN DEXTROSE-NACL 20-5-0.45 MEQ/L-%-% IV SOLN
INTRAVENOUS | Status: DC
  Administered 2014-08-17 – 2014-08-20 (×4): via INTRAVENOUS
  Filled 2014-08-17 (×4): qty 1000

## 2014-08-17 MED ORDER — CHLORHEXIDINE GLUCONATE CLOTH 2 % EX PADS
6.0000 | MEDICATED_PAD | Freq: Every day | CUTANEOUS | Status: DC
  Administered 2014-08-17: 6 via TOPICAL

## 2014-08-17 NOTE — Progress Notes (Signed)
INITIAL NUTRITION ASSESSMENT  DOCUMENTATION CODES Per approved criteria  -Not Applicable   INTERVENTION: - Once diet advanced, add Glucerna Shake po TID, each supplement provides 220 kcal and 10 grams of protein - Multivitamin with minerals once diet advanced - Diet advancement per MD - RD will continue to monitor  NUTRITION DIAGNOSIS: Inadequate oral intake related to inability to eat as evidenced by NPO.   Goal: Pt to meet >/= 90% of their estimated nutrition needs   Monitor:  Weight trend, NPO, labs  Reason for Assessment: Malnutrition Screening Tool  81 y.o. male  Admitting Dx: Sepsis  ASSESSMENT: 81 y.o. male with Past medical history of peripheral vascular disease, diabetes mellitus, hypertension, atrial fibrillation, aortic dissection history, bilateral lower leg neuropathy, status post CABG, chronic Foley catheter, chronic kidney disease.The patient presents with complaints of nausea and vomiting.Patient was recently hospitalized in January and was discharged to [facility].  - Nutritional history obtained from pt's wife.  - He was eating well up until 2-3 days ago.  - Pt recently hospitalized and his weight dropped from 170 lb to the 140's/150's. Per wife pt's appetite was improving and his weight had increased back up to the 160's.  - Current weight is 153 lbs.  - Pt has had several episodes of hypoglycemia this hospital admission. CBG during RD visit was 48.  - Pt with ileus. Family not interested in surgery or tube feeding per MD note. Pt with multiple wounds.  - Mild to moderate wasting at pt's temples - Palliative care consult pending for GOC - Will continue to monitor for diet advancement.  - Labs reviewed- CBGs 44-138, Na elevated, BUN elevated, albumin Low  Height: Ht Readings from Last 1 Encounters:  08/17/14 6' (1.829 m)    Weight: Wt Readings from Last 1 Encounters:  08/17/14 153 lb 3.5 oz (69.5 kg)    Ideal Body Weight: 77.6 kg  %  Ideal Body Weight: 90%  Wt Readings from Last 10 Encounters:  08/17/14 153 lb 3.5 oz (69.5 kg)  07/02/14 148 lb 13 oz (67.5 kg)  06/30/14 152 lb 14.4 oz (69.355 kg)  05/21/14 178 lb 5.6 oz (80.9 kg)  04/19/14 175 lb (79.379 kg)  12/16/13 170 lb 3.2 oz (77.202 kg)  10/28/13 169 lb 12.8 oz (77.021 kg)  04/30/13 176 lb 14.4 oz (80.241 kg)  04/19/13 169 lb (76.658 kg)  09/16/12 171 lb 15.3 oz (78 kg)    BMI:  Body mass index is 20.78 kg/(m^2).  Estimated Nutritional Needs: Kcal: 1800-2000 Protein: 110-125 g Fluid: 1.8-2.0 L/day  Skin: unstagable wound on ankle, stage II wound on buttocks, stage I wound on sacrum  Diet Order: Diet NPO time specified  EDUCATION NEEDS: -Education needs addressed   Intake/Output Summary (Last 24 hours) at 08/17/14 1013 Last data filed at 08/17/14 0800  Gross per 24 hour  Intake    650 ml  Output    450 ml  Net    200 ml    Last BM: prior to admission   Labs:   Recent Labs Lab 2014/09/14 1917 08/17/14 0420  NA 141 147*  K 4.3 4.7  CL 107 115*  CO2 24 19  BUN 80* 71*  CREATININE 2.69* 2.40*  CALCIUM 8.2* 8.0*  GLUCOSE 139* 48*    CBG (last 3)   Recent Labs  08/17/14 0350 08/17/14 0418 08/17/14 0642  GLUCAP 44* 138* 82    Scheduled Meds: . Chlorhexidine Gluconate Cloth  6 each Topical Q0600  .  dextrose      . heparin  5,000 Units Subcutaneous 3 times per day  . insulin aspart  0-15 Units Subcutaneous Q6H  . metoprolol tartrate  12.5 mg Oral BID  . metronidazole  500 mg Intravenous Q8H  . mupirocin ointment  1 application Nasal BID  . piperacillin-tazobactam (ZOSYN)  IV  3.375 g Intravenous Q8H  . sodium chloride  3 mL Intravenous Q12H  . [START ON 08/18/2014] vancomycin  1,000 mg Intravenous Q48H    Continuous Infusions: . sodium chloride 50 mL/hr at 08/17/14 0100  . dextrose 5 % and 0.45 % NaCl with KCl 20 mEq/L 50 mL/hr at 08/17/14 [id number]    Past Medical History  Diagnosis Date  . Peripheral vascular disease      critical limb ischemia  . Myocardial infarction   . Diabetes mellitus without complication   . Hypertension   . Coronary artery disease   . Atrial fibrillation   . Aortic dissection     w/ Cardiac tamponade  . AVM (arteriovenous malformation)   . Murmur   . Hyperlipidemia   . Neuropathy of left lower extremity   . S/P CABG (coronary artery bypass graft)     Past Surgical History  Procedure Laterality Date  . Coronary artery bypass graft  02/11/2005    LIMA to LAD, SVG to 1st diag, SVG to OM1, and SVG to PD  . Back surgery    . Atherectomy  09/15/2012    Diamondback orbital rotation atherectomy was performed using a 2 mm solid crown up to a max of 120,000 RPM. Angioplasty was performed using a 5x15820mm long chocolate balloon for 2 minute inflations. Resulting in reduction of an 80% calcified distal R SFA stenosis to less than 20%  . Lea doppler  10/01/2012    R SFA demonstrated a mild amount of residual plaque suggesting less then 50% diametere reduction. R Calf runoff-one vessel runoff via peroneal artery. The posterior and anterior tibial arteries appeared occluded.  . Cardiac catheterization  02/04/2005    Severe 3-vessel disease affecting LAD and RCA. Consider CABG  . Cardiovascular stress test  12/16/2006    EKG negative for ischemia. No ECG changes. No significant ischemia demonstrated  . Transthoracic echocardiogram  12/04/2011    EF 50-55%, moderate aortic root dilation  . Atherectomy N/A 09/15/2012    Procedure: ATHERECTOMY;  Surgeon: Runell GessJonathan J Berry, MD;  [facility];  Service: Cardiovascular;  Laterality: N/A;    Emmaline KluverHaley Angelyn Osterberg MS, RD, LDN [phone]

## 2014-08-17 NOTE — Progress Notes (Signed)
CARE MANAGEMENT NOTE 08/17/2014  Patient:  [name] [name],[name] [name]   Account Number:  [id number]  Date Initiated:  08/17/2014  Documentation initiated by:  Jona Zappone  Subjective/Objective Assessment:   abd distention,pain,temp, desat into the 80's     Action/Plan:   from snf is [name] dnr family does not want surgical intervention   Anticipated DC Date:  08/20/2014   Anticipated DC Plan:  SKILLED NURSING FACILITY  In-house referral  Clinical Social Worker      DC Planning Services  CM consult      [facility] Choice  NA   Choice offered to / List presented to:  NA   DME arranged  NA      DME agency  NA     HH arranged  NA      HH agency  NA   Status of service:  In process, will continue to follow Medicare Important Message given?   (If response is "NO", the following Medicare IM given date fields will be blank) Date Medicare IM given:   Medicare IM given by:   Date Additional Medicare IM given:   Additional Medicare IM given by:    Discharge Disposition:    Per UR Regulation:  Reviewed for med. necessity/level of care/duration of stay  If discussed at Long Length of Stay Meetings, dates discussed:    Comments:  Feb. 17 2016/Orine Goga L. Earlene Plateravis, RN, BSN, CCM/Case Management  Systems [phone] No discharge needs present of time of review.

## 2014-08-17 NOTE — Progress Notes (Signed)
Attempted to insert NG tube x2. Unable to succeed due to coiling in the mouth. Pt refusing to have NG tube to inserted again. Dr. Allena KatzPatel notified of attempts, while currently at bedside. Dr. Allena KatzPatel states it is ok to leave NG tube out at this time.

## 2014-08-17 NOTE — Progress Notes (Signed)
Hypoglycemic Event  CBG: 44  Treatment: D50 IV 50 mL  Symptoms: None  Follow-up CBG: Time:0418 CBG Result:138  Possible Reasons for Event: Inadequate meal intake  Comments/MD notified:    Tracie HarrierYoung, Zackariah Vanderpol Nicole  Remember to initiate Hypoglycemia Order Set & complete

## 2014-08-17 NOTE — Progress Notes (Signed)
[facility] PROGRESS NOTE  [name] [name] AVW:[id number] DOB: [birth year] DOA: 08/15/2014 PCP: Tawanna Solo, MD  Assessment/Plan: Principal Problem:   Sepsis - Source most likely secondary to urinary source given urinalysis - Continue broad-spectrum antibiotics (vancomycin and Zosyn) and monitor closely in stepdown/ICU area - Place on gentle fluid hydration - An H&P there was mention about consideration of C. difficile but there is no history that I am aware of reporting multiple bouts of diarrhea. In fact on review of nurse's notes was mention of wife recently disimpacting patient. As such my index of suspicion for C. difficile is low for now we'll continue Flagyl  UTI - Patient has chronic indwelling Foley. Given UTI will recommend changing Foley - Continue broad-spectrum antibiotics - Urine cultures pending  Active Problems:   CAD - CABG x 4 in 2006 -No chest pain currently reported    Neurogenic bladder   Pulmonary fibrosis - Most likely contributing to shortness of breath. - Given patient has chronic condition with pulmonary fibrosis do not suspect that there is much full for steroid administration as there is no wheezing and in lieu of active infection will discontinue steroids so as not to decrease patient's ability to fight off into his infection    H/O aortic dissection - No chest pain/back pain reported    Acute encephalopathy - Most likely secondary to sepsis from infectious etiology    Chronic atrial fibrillation - Last reported blood pressure 149/83. We'll plan on continuing beta blocker for rate control this a.m. As such will restart metoprolol at lower dose as patient's heart rate is at 116. - Abdominal x-ray does not report any SBO. We'll try oral rate controlling agent but if not tolerated we'll transition to IV    Diabetes mellitus type 2, uncontrolled - Nursing reports patient recently hypoglycemic - We'll place on D5 1 half normal saline while  nothing by mouth with CBG checks every 4 hours    Acute and chronic respiratory failure (acute-on-chronic) - Saturating well with nonrebreather - With last range from 97 200%. Reportedly patient desaturated when he took nonrebreather off to speak with surgeon. - We will continue supplemental oxygen and reassess    Bowel obstruction - Gen. surgery on board and plans are for no operations given family wishes. Reassessment occurred 08/17/2014 and abdominal x-ray reports persistent gastric distention no interim improvement. Patient is currently nothing by mouth    LFT elevation - Could be related to sepsis although alk phosphatase elevated as well raising suspicion for gall stones. General surgery on board. I suspect that source of infection most likely from UTI as such we'll not make recommendations for percutaneous drainage of gall bladder  Code Status: DNR/DNI Family Communication: No family at bedside. Called spouses number on EMR with no response Disposition Plan: Monitor in stepdown/ICU area   Consultants:  General surgery  Procedures:  None  Antibiotics:  Vancomycin and Zosyn  HPI/Subjective: The patient was somnolent but arousable and when I arrived in the room he took off his nonrebreather to speak to me. I indicated I preferred he continue this on and dressed of which he nodded his head in agreement  Objective: Filed Vitals:   08/17/14 0800  BP: 129/70  Pulse: 116  Temp:   Resp: 16    Intake/Output Summary (Last 24 hours) at 08/17/14 1000 Last data filed at 08/17/14 0800  Gross per 24 hour  Intake    650 ml  Output    450 ml  Net  200 ml   Filed Weights   08/15/2014 2056 08/17/14 0015  Weight: 71.668 kg (158 lb) 69.5 kg (153 lb 3.5 oz)    Exam:   General:  Patient in no acute distress, laying supine in bed with nonrebreather properly in place.  Cardiovascular: S1 and S2 present and within normal limits, no rubs  Respiratory: No wheezes, equal chest  rise  Abdomen: Soft, generalized tenderness with deep palpation, no guarding, no rebound tenderness  Musculoskeletal: No cyanosis or clubbing   Data Reviewed: Basic Metabolic Panel:  Recent Labs Lab 08/27/2014 1917 08/17/14 0420  NA 141 147*  K 4.3 4.7  CL 107 115*  CO2 24 19  GLUCOSE 139* 48*  BUN 80* 71*  CREATININE 2.69* 2.40*  CALCIUM 8.2* 8.0*   Liver Function Tests:  Recent Labs Lab 08/13/2014 1917 08/17/14 0420  AST 465* 389*  ALT 338* 344*  ALKPHOS 452* 426*  BILITOT 3.7* 4.0*  PROT 7.0 6.4  ALBUMIN 2.7* 2.4*   No results for input(s): LIPASE, AMYLASE in the last 168 hours. No results for input(s): AMMONIA in the last 168 hours. CBC:  Recent Labs Lab 08/01/2014 1917 08/17/14 0420  WBC 9.3 12.3*  NEUTROABS 8.2* 11.2*  HGB 10.4* 10.6*  HCT 33.5* 33.7*  MCV 92.5 91.8  PLT 250 222   Cardiac Enzymes: No results for input(s): CKTOTAL, CKMB, CKMBINDEX, TROPONINI in the last 168 hours. BNP (last 3 results)  Recent Labs  06/29/14 0410 06/30/14 0415  BNP 283.2* 117.3*    ProBNP (last 3 results)  Recent Labs  05/18/14 1410 06/20/14 1202  PROBNP 4183.0* 14250.0*    CBG:  Recent Labs Lab 08/17/14 0102 08/17/14 0151 08/17/14 0350 08/17/14 0418 08/17/14 0642  GLUCAP 54* 81 44* 138* 82    Recent Results (from the past 240 hour(s))  MRSA PCR Screening     Status: Abnormal   Collection Time: 08/17/14 12:46 AM  Result Value Ref Range Status   MRSA by PCR POSITIVE (A) NEGATIVE Final    Comment:        The GeneXpert MRSA Assay (FDA approved for NASAL specimens only), is one component of a comprehensive MRSA colonization surveillance program. It is not intended to diagnose MRSA infection nor to guide or monitor treatment for MRSA infections. RESULT CALLED TO, READ BACK BY AND VERIFIED WITH: S. YOUNG RN AT [phone] ON 02.17.16 BY SHUEA      Studies: Dg Chest Port 1 View  08/26/2014   CLINICAL DATA:  Shortness of breath and altered mental  status.  EXAM: PORTABLE CHEST - 1 VIEW  COMPARISON:  CT chest 06/20/2014. Plain films of the chest 07/01/2014 and 06/27/2014.  FINDINGS: Extensive pulmonary fibrosis and emphysema are again seen. No new airspace disease identified. Heart size is normal. No pneumothorax or pleural fluid is identified.  IMPRESSION: No acute finding in patient with extensive pulmonary fibrosis and emphysema.   Electronically Signed   By: Inge Rise M.D.   On: 08/02/2014 19:51   Dg Abd 2 Views  08/17/2014   CLINICAL DATA:  Abdominal distention.  Follow-up ileus.  EXAM: ABDOMEN - 2 VIEW  COMPARISON:  None.  FINDINGS: Soft tissue structures are unremarkable. Persistent prominent distention of the stomach. Stool noted throughout the colon. No free air identified. Lumbar spine degenerative change. Prior L4-L5 fusion. Degenerative changes both hips. Aortoiliac atherosclerotic vascular disease. Calcific densities project over both kidneys.  IMPRESSION: Persistent gastric distention, no interim improvement.   Electronically Signed   By: Marcello Moores  Register   On: 08/17/2014 09:55   Dg Abd Portable 2v  08/02/2014   CLINICAL DATA:  Dyspnea altered mental status  EXAM: PORTABLE ABDOMEN - 2 VIEW  COMPARISON:  CT 07/02/2014  FINDINGS: Two portable views of the abdomen demonstrate marked gaseous distention of the stomach. Air is scattered throughout small and large bowel to the rectum. No free intraperitoneal air is evident.  IMPRESSION: Marked gaseous distention of the stomach.   Electronically Signed   By: Andreas Newport M.D.   On: 08/26/2014 22:55    Scheduled Meds: . Chlorhexidine Gluconate Cloth  6 each Topical Q0600  . heparin  5,000 Units Subcutaneous 3 times per day  . insulin aspart  0-15 Units Subcutaneous Q6H  . methylPREDNISolone (SOLU-MEDROL) injection  60 mg Intravenous Q12H  . metronidazole  500 mg Intravenous Q8H  . mupirocin ointment  1 application Nasal BID  . piperacillin-tazobactam (ZOSYN)  IV  3.375 g  Intravenous Q8H  . sodium chloride  3 mL Intravenous Q12H  . [START ON 08/18/2014] vancomycin  1,000 mg Intravenous Q48H   Continuous Infusions: . sodium chloride 50 mL/hr at 08/17/14 0100  . dextrose 5 % and 0.45 % NaCl with KCl 20 mEq/L 50 mL/hr at 08/17/14 0957     Time spent: > 45 minutes    Velvet Bathe  [facility] Pager [phone] If 7PM-7AM, please contact night-coverage at [email or web address], password [facility] 08/17/2014, 10:00 AM  LOS: 1 day

## 2014-08-17 NOTE — Progress Notes (Signed)
[name] [name]. D50 GIVEN PER PROTOCOL.[name] AT 1015 102. Pt alert,skin. Dr Cena BentonVega notifed. Pts wife requested that dressings on sacrum and left buttocks be removed and barrier cream applied.

## 2014-08-17 NOTE — Consult Note (Signed)
Patient RU:[id number]:[name] [name]      DOB: 12/16/1928      WJX:[id number]     Cons[facility]    Consult Requested by: Dr Allena KatzPatel     PCP: [name],[name], MD Reason for Consultation: GOC     Phone Number:[phone]  Assessment/Recommendations: 81 yo male with PVD, DM, Afib, aortic dissection, ILD/PAH in setting of amio toxicity who was admitted 2/16 with N/V, encephalopathy.    1.  Code Status:  DNR   2. GOC: Difficult situation where I sense discrepancy between [name] and his wife Clydie BraunKaren. [name] frankly states that they do not agree on goals of care.  When I spoke to Clydie BraunKaren she feels he is "getting a lot better" since arriving.  When I addressed concerns about hypoxia, she often angles conversation elsewhere and focuses on how [facility], wonders about yeast infection in his gut, if probiotics will get him better. She feels he was making great progress at rehab which [name] actually disputes. I ask karen about DNR-C he filled out on [facility] and her reply was that "he was trying to be funny".  Clydie BraunKaren also points out that he keeps "bouncing back and surprising doctors".   [name] points more towards frustrations with chronic neuropathy, repeated hospitalizations, etc.  In speaking further with [name] and Clydie BraunKaren individually, they agree that we should sit down together to talk further about these issues. I will meet with them tomorrow around 9AM.     3. Symptom Management:   Chronic Pain- Given his AKI and concern for opioids playing role in sedation will switch morphine to low dose fentanyl which will be a bit safer with his renal and potential liver function abnormalities. Agree with holding neurontin percocet.  Neurontin dose recently increased and may have contributed to encephalopathy in setting of AKI.    4. Psychosocial/Spiritual: Was last home in November. Wife states he enjoys watching TV and doing crosswords.      Brief HPI: 81 yo male with PVD, DM, Afib, aortic dissection, paraplegia ILD/PAH in setting of amio toxicity who was admitted 2/16 with N/V, encephalopathy. Reportedly [facility].  Also developed worsening lower ext pain requiring more frequent percocet dosing and increase in neurontin.  On admission concern for sepsis 2/2 UTI which he is being treated with abx.  Colonic obstruction and acute hypoxic resp failure.  Surgery consulted and family in agreement that he shoul not have surgery. This is his 3rd admission since December and he has not been home since November.        PMH:  Past Medical History  Diagnosis Date  . Peripheral vascular disease     critical limb ischemia  . Myocardial infarction   . Diabetes mellitus without complication   . Hypertension   . Coronary artery disease   . Atrial fibrillation   . Aortic dissection     w/ Cardiac tamponade  . AVM (arteriovenous malformation)   . Murmur   . Hyperlipidemia   . Neuropathy of left lower extremity   . S/P CABG (coronary artery bypass graft)      PSH: Past Surgical History  Procedure Laterality Date  . Coronary artery bypass graft  02/11/2005    LIMA to LAD, SVG to 1st diag, SVG to OM1, and SVG to PD  . Back surgery    . Atherectomy  09/15/2012    Diamondback orbital rotation atherectomy  was performed using a 2 mm solid crown up to a max of 120,000 RPM. Angioplasty was performed using a 5x141mm long chocolate balloon for 2 minute inflations. Resulting in reduction of an 80% calcified distal R SFA stenosis to less than 20%  . Lea doppler  10/01/2012    R SFA demonstrated a mild amount of residual plaque suggesting less then 50% diametere reduction. R Calf runoff-one vessel runoff via peroneal artery. The posterior and anterior tibial arteries appeared occluded.  . Cardiac catheterization  02/04/2005    Severe 3-vessel disease affecting LAD and RCA. Consider CABG  . Cardiovascular stress test   12/16/2006    EKG negative for ischemia. No ECG changes. No significant ischemia demonstrated  . Transthoracic echocardiogram  12/04/2011    EF 50-55%, moderate aortic root dilation  . Atherectomy N/A 09/15/2012    Procedure: ATHERECTOMY;  Surgeon: Runell Gess, MD;  Location: [facility] CATH LAB;  Service: Cardiovascular;  Laterality: N/A;   I have reviewed the FH and SH and  If appropriate update it with new information. Allergies  Allergen Reactions  . Cymbalta [Duloxetine Hcl] Other (See Comments)    Confused, paralyzed from waist up   . Statins Other (See Comments)    Leg pains   Scheduled Meds: . Chlorhexidine Gluconate Cloth  6 each Topical Q0600  . heparin  5,000 Units Subcutaneous 3 times per day  . insulin aspart  0-15 Units Subcutaneous 6 times per day  . metoprolol tartrate  12.5 mg Oral BID  . metronidazole  500 mg Intravenous Q8H  . mupirocin ointment  1 application Nasal BID  . piperacillin-tazobactam (ZOSYN)  IV  3.375 g Intravenous Q8H  . sodium chloride  3 mL Intravenous Q12H  . [START ON 08/18/2014] vancomycin  1,000 mg Intravenous Q48H   Continuous Infusions: . sodium chloride Stopped (08/17/14 0900)  . dextrose 5 % and 0.45 % NaCl with KCl 20 mEq/L 50 mL/hr at 08/17/14 0957   PRN Meds:.ipratropium-albuterol, morphine injection, ondansetron **OR** ondansetron (ZOFRAN) IV    BP 154/69 mmHg  Pulse 114  Temp(Src) 98.3 F (36.8 C) (Oral)  Resp 22  Ht 6' (1.829 m)  Wt 69.5 kg (153 lb 3.5 oz)  BMI 20.78 kg/m2  SpO2 95%   PPS: 20   Intake/Output Summary (Last 24 hours) at 08/17/14 1604 Last data filed at 08/17/14 1521  Gross per 24 hour  Intake   1050 ml  Output   1500 ml  Net   -450 ml   Physical Exam:  General:  Alert, on NRB, NAD HEENT:  [place], dry mm Neck: supple Chest:   coarse CVS: Tachy Abdomen:soft, diffusely tender Ext: no edema   Labs: CBC    Component Value Date/Time   WBC 12.3* 08/17/2014 0420   RBC 3.67* 08/17/2014 0420   RBC 3.07*  05/19/2014 0802   HGB 10.6* 08/17/2014 0420   HCT 33.7* 08/17/2014 0420   PLT 222 08/17/2014 0420   MCV 91.8 08/17/2014 0420   MCH 28.9 08/17/2014 0420   MCHC 31.5 08/17/2014 0420   RDW 17.0* 08/17/2014 0420   LYMPHSABS 0.6* 08/17/2014 0420   MONOABS 0.4 08/17/2014 0420   EOSABS 0.0 08/17/2014 0420   BASOSABS 0.0 08/17/2014 0420    BMET    Component Value Date/Time   NA 147* 08/17/2014 0420   K 4.7 08/17/2014 0420   CL 115* 08/17/2014 0420   CO2 19 08/17/2014 0420   GLUCOSE 48* 08/17/2014 0420   BUN 71* 08/17/2014 0420  CREATININE 2.40* 08/17/2014 0420   CREATININE 1.61* 01/21/2014 1146   CALCIUM 8.0* 08/17/2014 0420   GFRNONAA 23* 08/17/2014 0420   GFRAA 27* 08/17/2014 0420    CMP     Component Value Date/Time   NA 147* 08/17/2014 0420   K 4.7 08/17/2014 0420   CL 115* 08/17/2014 0420   CO2 19 08/17/2014 0420   GLUCOSE 48* 08/17/2014 0420   BUN 71* 08/17/2014 0420   CREATININE 2.40* 08/17/2014 0420   CREATININE 1.61* 01/21/2014 1146   CALCIUM 8.0* 08/17/2014 0420   PROT 6.4 08/17/2014 0420   ALBUMIN 2.4* 08/17/2014 0420   AST 389* 08/17/2014 0420   ALT 344* 08/17/2014 0420   ALKPHOS 426* 08/17/2014 0420   BILITOT 4.0* 08/17/2014 0420   GFRNONAA 23* 08/17/2014 0420   GFRAA 27* 08/17/2014 0420   2/16 CXR IMPRESSION: No acute finding in patient with extensive pulmonary fibrosis and emphysema.  2/17 KUB IMPRESSION: Persistent gastric distention, no interim improvement.    Total Time: 75 minutes Greater than 50%  of this time was spent counseling and coordinating care related to the above assessment and plan.  Orvis Brill D.O. Palliative Medicine Team at [facility]  Pager: [phone] Team Phone: [phone]

## 2014-08-17 NOTE — Consult Note (Signed)
Reason for Consult:nausea, vomiting, elevated LFTs Referring Physician:  Berle Mull, MD  [name] [name] is an 81 y.o. male.  HPI:  Pt is an 81 yo M with numerous medical problems who presented from [facility] with nausea and vomiting.  He has numerous medical problems outlined below.  He has functional paraplegia secondary to diabetic neuropathy with a chronic indwelling foley catheter.  History was obtained from the chart at this time since his wife was home.  The patient is DNR, but does desire treatment with the primary goals of making his life comfortable.   This patient has also had mental status changes over the last few days.  He is on chronic narcotics and his wife has had to disimpact him previously.   He had plain films at the outside facilities suggestive of bowel obstruction, so he was transferred to the ED. Four attempts were made to place NGT, but the tube kept coiling in the throat.      Past Medical History  Diagnosis Date  . Peripheral vascular disease     critical limb ischemia  . Myocardial infarction   . Diabetes mellitus without complication   . Hypertension   . Coronary artery disease   . Atrial fibrillation   . Aortic dissection     w/ Cardiac tamponade  . AVM (arteriovenous malformation)   . Murmur   . Hyperlipidemia   . Neuropathy of left lower extremity   . S/P CABG (coronary artery bypass graft)     Past Surgical History  Procedure Laterality Date  . Coronary artery bypass graft  02/11/2005    LIMA to LAD, SVG to 1st diag, SVG to OM1, and SVG to PD  . Back surgery    . Atherectomy  09/15/2012    Diamondback orbital rotation atherectomy was performed using a 2 mm solid crown up to a max of 120,000 RPM. Angioplasty was performed using a 5x17m long chocolate balloon for 2 minute inflations. Resulting in reduction of an 80% calcified distal R SFA stenosis to less than 20%  . Lea doppler  10/01/2012    R SFA demonstrated a mild amount of  residual plaque suggesting less then 50% diametere reduction. R Calf runoff-one vessel runoff via peroneal artery. The posterior and anterior tibial arteries appeared occluded.  . Cardiac catheterization  02/04/2005    Severe 3-vessel disease affecting LAD and RCA. Consider CABG  . Cardiovascular stress test  12/16/2006    EKG negative for ischemia. No ECG changes. No significant ischemia demonstrated  . Transthoracic echocardiogram  12/04/2011    EF 50-55%, moderate aortic root dilation  . Atherectomy N/A 09/15/2012    Procedure: ATHERECTOMY;  Surgeon: JLorretta Harp MD;  [facility];  Service: Cardiovascular;  Laterality: N/A;    Family History  Problem Relation Age of Onset  . Hypertension Father     Social History:  reports that he quit smoking about 22 years ago. He has never used smokeless tobacco. He reports that he does not drink alcohol or use illicit drugs.  Allergies:  Allergies  Allergen Reactions  . Cymbalta [Duloxetine Hcl] Other (See Comments)    Confused, paralyzed from waist up   . Statins Other (See Comments)    Leg pains    Medications:  Prior to Admission:  Prescriptions prior to admission  Medication Sig Dispense Refill Last Dose  . amLODipine (NORVASC) 2.5 MG tablet Take 3 tablets (7.5 mg total) by mouth daily. 90 tablet 0 08/27/2014  at Unknown time  . aspirin 81 MG tablet Take 81 mg by mouth daily with breakfast.    08/13/2014 at Unknown time  . calcium carbonate (OS-CAL - DOSED IN MG OF ELEMENTAL CALCIUM) 1250 MG tablet Take 1 tablet by mouth 2 (two) times daily with a meal.    08/23/2014 at Unknown time  . cefTRIAXone (ROCEPHIN) 1 G injection Inject 1 g into the vein daily. For 2 Days.   08/15/2014 at 1630  . ezetimibe (ZETIA) 10 MG tablet Take 1 tablet (10 mg total) by mouth daily. (Patient taking differently: Take 10 mg by mouth daily with breakfast. ) 90 tablet 3 08/12/2014 at Unknown time  . ferrous sulfate 325 (65 FE) MG tablet Take 1 tablet (325 mg  total) by mouth daily with breakfast. 30 tablet 0 08/12/2014 at Unknown time  . furosemide (LASIX) 20 MG tablet Take 1 tablet (20 mg total) by mouth daily. 30 tablet 0 08/12/2014 at Unknown time  . gabapentin (NEURONTIN) 300 MG capsule Take 1 capsule (300 mg total) by mouth 2 (two) times daily. (Patient taking differently: Take 300 mg by mouth 4 (four) times daily. ) 60 capsule 0 08/05/2014 at Unknown time  . insulin aspart (NOVOLOG) 100 UNIT/ML FlexPen Inject 7 Units into the skin 3 (three) times daily with meals. 15 mL 11 08/22/2014 at Unknown time  . Insulin Detemir (LEVEMIR) 100 UNIT/ML Pen Inject 30 Units into the skin every morning.   08/09/2014 at Unknown time  . methocarbamol (ROBAXIN) 500 MG tablet Take 500 mg by mouth 3 (three) times daily as needed for muscle spasms (muscle spasms).   08/15/2014 at Unknown time  . metoprolol (LOPRESSOR) 50 MG tablet Take 50 mg by mouth 2 (two) times daily.   08/03/2014 at 0900  . Multiple Vitamin (MULTIVITAMIN WITH MINERALS) TABS tablet Take 1 tablet by mouth daily.   08/15/2014 at Unknown time  . multivitamin-iron-minerals-folic acid (THERAPEUTIC-M) TABS tablet Take 1 tablet by mouth daily.   08/17/2014 at Unknown time  . Omega-3 Fatty Acids (FISH OIL PO) Take 1 capsule by mouth 2 (two) times daily.   08/25/2014 at Unknown time  . Ondansetron HCl (ZOFRAN IV) Inject 4 mg into the vein daily as needed (nausea and vomiting).    08/27/2014 at Unknown time  . oxyCODONE-acetaminophen (PERCOCET/ROXICET) 5-325 MG per tablet Take 1-2 tablets by mouth See admin instructions. 2 tabs every morning, 1 tab in the afternoon and 2 tabs at bedtime (Patient taking differently: Take 1-2 tablets by mouth every 4 (four) hours as needed. ) 30 tablet 0 08/02/2014 at Unknown time  . potassium chloride (K-DUR,KLOR-CON) 10 MEQ tablet Take 1 tablet (10 mEq total) by mouth daily. 30 tablet 0 08/04/2014 at Unknown time  . Probiotic Product (PROBIOTIC DAILY) CAPS Take 1 capsule by mouth every  evening.   08/15/2014 at Unknown time  . vitamin C (ASCORBIC ACID) 500 MG tablet Take 500 mg by mouth daily.   08/25/2014 at Unknown time  . docusate sodium 100 MG CAPS Take 100 mg by mouth 2 (two) times daily. (Patient not taking: Reported on 08/02/2014) 60 capsule 0 Completed Course at Unknown time  . insulin glargine (LANTUS) 100 UNIT/ML injection Inject 0.24 mLs (24 Units total) into the skin daily with breakfast. (Patient not taking: Reported on 08/28/2014) 10 mL 11 Not Taking at Unknown time  . omeprazole (PRILOSEC) 40 MG capsule Take 40 mg by mouth daily.     . predniSONE (DELTASONE) 20 MG tablet Take 2 tablets (  40 mg total) by mouth daily with breakfast. (Patient not taking: Reported on 07/01/2014) 30 tablet 0 Completed Course at Unknown time    Results for orders placed or performed during the hospital encounter of 08/13/2014 (from the past 48 hour(s))  CBC WITH DIFFERENTIAL     Status: Abnormal   Collection Time: 08/15/2014  7:17 PM  Result Value Ref Range   WBC 9.3 4.0 - 10.5 K/uL   RBC 3.62 (L) 4.22 - 5.81 MIL/uL   Hemoglobin 10.4 (L) 13.0 - 17.0 g/dL   HCT 33.5 (L) 39.0 - 52.0 %   MCV 92.5 78.0 - 100.0 fL   MCH 28.7 26.0 - 34.0 pg   MCHC 31.0 30.0 - 36.0 g/dL   RDW 16.7 (H) 11.5 - 15.5 %   Platelets 250 150 - 400 K/uL   Neutrophils Relative % 89 (H) 43 - 77 %   Lymphocytes Relative 7 (L) 12 - 46 %   Monocytes Relative 4 3 - 12 %   Eosinophils Relative 0 0 - 5 %   Basophils Relative 0 0 - 1 %   Neutro Abs 8.2 (H) 1.7 - 7.7 K/uL   Lymphs Abs 0.7 0.7 - 4.0 K/uL   Monocytes Absolute 0.4 0.1 - 1.0 K/uL   Eosinophils Absolute 0.0 0.0 - 0.7 K/uL   Basophils Absolute 0.0 0.0 - 0.1 K/uL   RBC Morphology ELLIPTOCYTES    WBC Morphology INCREASED BANDS (>20% BANDS)    Smear Review LARGE PLATELETS PRESENT   Comprehensive metabolic panel     Status: Abnormal   Collection Time: 08/05/2014  7:17 PM  Result Value Ref Range   Sodium 141 135 - 145 mmol/L   Potassium 4.3 3.5 - 5.1 mmol/L    Chloride 107 96 - 112 mmol/L   CO2 24 19 - 32 mmol/L   Glucose, Bld 139 (H) 70 - 99 mg/dL   BUN 80 (H) 6 - 23 mg/dL   Creatinine, Ser 2.69 (H) 0.50 - 1.35 mg/dL   Calcium 8.2 (L) 8.4 - 10.5 mg/dL   Total Protein 7.0 6.0 - 8.3 g/dL   Albumin 2.7 (L) 3.5 - 5.2 g/dL   AST 465 (H) 0 - 37 U/L   ALT 338 (H) 0 - 53 U/L   Alkaline Phosphatase 452 (H) 39 - 117 U/L   Total Bilirubin 3.7 (H) 0.3 - 1.2 mg/dL   GFR calc non Af Amer 20 (L) >90 mL/min   GFR calc Af Amer 24 (L) >90 mL/min    Comment: (NOTE) The eGFR has been calculated using the CKD EPI equation. This calculation has not been validated in all clinical situations. eGFR's persistently <90 mL/min signify possible Chronic Kidney Disease.    Anion gap 10 5 - 15  I-Stat CG4 Lactic Acid, ED (not at [facility])     Status: Abnormal   Collection Time: 08/12/2014  7:43 PM  Result Value Ref Range   Lactic Acid, Venous 2.05 (HH) 0.5 - 2.0 mmol/L   Comment NOTIFIED PHYSICIAN   Urinalysis, Routine w reflex microscopic     Status: Abnormal   Collection Time: 08/09/2014  8:02 PM  Result Value Ref Range   Color, Urine AMBER (A) YELLOW    Comment: BIOCHEMICALS MAY BE AFFECTED BY COLOR   APPearance TURBID (A) CLEAR   Specific Gravity, Urine 1.017 1.005 - 1.030   pH 5.5 5.0 - 8.0   Glucose, UA NEGATIVE NEGATIVE mg/dL   Hgb urine dipstick LARGE (A) NEGATIVE   Bilirubin Urine  SMALL (A) NEGATIVE   Ketones, ur NEGATIVE NEGATIVE mg/dL   Protein, ur 100 (A) NEGATIVE mg/dL   Urobilinogen, UA 1.0 0.0 - 1.0 mg/dL   Nitrite POSITIVE (A) NEGATIVE   Leukocytes, UA LARGE (A) NEGATIVE  Urine microscopic-add on     Status: None   Collection Time: 08/11/2014  8:02 PM  Result Value Ref Range   WBC, UA TOO NUMEROUS TO COUNT <3 WBC/hpf   Urine-Other FIELD OBSCURED BY WBC'S   CBG monitoring, ED     Status: Abnormal   Collection Time: 08/05/2014 10:28 PM  Result Value Ref Range   Glucose-Capillary 109 (H) 70 - 99 mg/dL  I-Stat CG4 Lactic Acid, ED (not at [facility])      Status: None   Collection Time: 08/05/2014 10:39 PM  Result Value Ref Range   Lactic Acid, Venous 1.54 0.5 - 2.0 mmol/L  MRSA PCR Screening     Status: Abnormal   Collection Time: 08/17/14 12:46 AM  Result Value Ref Range   MRSA by PCR POSITIVE (A) NEGATIVE    Comment:        The GeneXpert MRSA Assay (FDA approved for NASAL specimens only), is one component of a comprehensive MRSA colonization surveillance program. It is not intended to diagnose MRSA infection nor to guide or monitor treatment for MRSA infections. RESULT CALLED TO, READ BACK BY AND VERIFIED WITH: S. YOUNG RN AT [phone] ON 02.17.16 BY SHUEA   Glucose, capillary     Status: Abnormal   Collection Time: 08/17/14  1:02 AM  Result Value Ref Range   Glucose-Capillary 54 (L) 70 - 99 mg/dL  Glucose, capillary     Status: None   Collection Time: 08/17/14  1:51 AM  Result Value Ref Range   Glucose-Capillary 81 70 - 99 mg/dL  Glucose, capillary     Status: Abnormal   Collection Time: 08/17/14  3:50 AM  Result Value Ref Range   Glucose-Capillary 44 (LL) 70 - 99 mg/dL   Comment 1 Notify RN    Comment 2 Document in Chart   Glucose, capillary     Status: Abnormal   Collection Time: 08/17/14  4:18 AM  Result Value Ref Range   Glucose-Capillary 138 (H) 70 - 99 mg/dL  CBC with Differential/Platelet     Status: Abnormal   Collection Time: 08/17/14  4:20 AM  Result Value Ref Range   WBC 12.3 (H) 4.0 - 10.5 K/uL   RBC 3.67 (L) 4.22 - 5.81 MIL/uL   Hemoglobin 10.6 (L) 13.0 - 17.0 g/dL   HCT 33.7 (L) 39.0 - 52.0 %   MCV 91.8 78.0 - 100.0 fL   MCH 28.9 26.0 - 34.0 pg   MCHC 31.5 30.0 - 36.0 g/dL   RDW 17.0 (H) 11.5 - 15.5 %   Platelets 222 150 - 400 K/uL   Neutrophils Relative % 92 (H) 43 - 77 %   Neutro Abs 11.2 (H) 1.7 - 7.7 K/uL   Lymphocytes Relative 5 (L) 12 - 46 %   Lymphs Abs 0.6 (L) 0.7 - 4.0 K/uL   Monocytes Relative 3 3 - 12 %   Monocytes Absolute 0.4 0.1 - 1.0 K/uL   Eosinophils Relative 0 0 - 5 %   Eosinophils  Absolute 0.0 0.0 - 0.7 K/uL   Basophils Relative 0 0 - 1 %   Basophils Absolute 0.0 0.0 - 0.1 K/uL  Protime-INR     Status: Abnormal   Collection Time: 08/17/14  4:20 AM  Result Value  Ref Range   Prothrombin Time 17.0 (H) 11.6 - 15.2 seconds   INR 1.37 0.00 - 1.49    Dg Chest Port 1 View  08/05/2014   CLINICAL DATA:  Shortness of breath and altered mental status.  EXAM: PORTABLE CHEST - 1 VIEW  COMPARISON:  CT chest 06/20/2014. Plain films of the chest 07/01/2014 and 06/27/2014.  FINDINGS: Extensive pulmonary fibrosis and emphysema are again seen. No new airspace disease identified. Heart size is normal. No pneumothorax or pleural fluid is identified.  IMPRESSION: No acute finding in patient with extensive pulmonary fibrosis and emphysema.   Electronically Signed   By: Inge Rise M.D.   On: 08/15/2014 19:51   Dg Abd Portable 2v  08/19/2014   CLINICAL DATA:  Dyspnea altered mental status  EXAM: PORTABLE ABDOMEN - 2 VIEW  COMPARISON:  CT 07/02/2014  FINDINGS: Two portable views of the abdomen demonstrate marked gaseous distention of the stomach. Air is scattered throughout small and large bowel to the rectum. No free intraperitoneal air is evident.  IMPRESSION: Marked gaseous distention of the stomach.   Electronically Signed   By: Andreas Newport M.D.   On: 08/10/2014 22:55    Review of Systems  Unable to perform ROS: medical condition   Blood pressure 135/65, pulse 113, temperature 97.5 F (36.4 C), temperature source Axillary, resp. rate 20, height 6' (1.829 m), weight 153 lb 3.5 oz (69.5 kg), SpO2 99 %. Physical Exam  Constitutional: He appears well-developed and well-nourished. He appears distressed (slightly distressed pulling at oxygen mask).  HENT:  Head: Normocephalic and atraumatic.  Eyes: Pupils are equal, round, and reactive to light. No scleral icterus.  Cardiovascular:  tachycardic  Respiratory: Effort normal. No respiratory distress.  GI: Soft. He exhibits  distension (mild to moderate). There is no tenderness. There is no rebound and no guarding.  Neurological:  Eyes open to voice and stimulation, but would not answer any questions.    Skin: Skin is warm and dry. No rash noted. He is not diaphoretic. No erythema. There is pallor.    Assessment/Plan: Ileus secondary to UTI. Plain films here show gas all the way to rectum, but distended stomach and small bowel.   Repeat films.  Would start enemas in the next day or so.   Also, consider repeat attempt for NGT placement if nausea/vomiting persists.  CT scan with oral contrast only could also be helpful if he does not improve with treatment of his UTI.    Elevated LFTs.  These are of unclear etiology.  May be related to the gallbladder.  Would see if they clear with hydration.  Would base workup on patient and family desire for possible intervention.  Patient's wife has advised Dr. Posey Pronto that they would not pursue any additional surgical intervention.  RUQ ultrasound would be good first step if they do not decrease.    Kayzen Kendzierski 08/17/2014, 4:58 AM

## 2014-08-17 NOTE — Progress Notes (Signed)
CRITICAL VALUE ALERT  Critical value received: MRSA PCR +  Date of notification:  08/17/14  Time of notification:  0448  Critical value read back:Yes.    Nurse who received alert:  S.Young,RN  MD notified (1st page):  Protocol initiated  Time of first page:    MD notified (2nd page):  Time of second page:  Responding MD:    Time MD responded:

## 2014-08-17 NOTE — Progress Notes (Signed)
ANTIBIOTIC CONSULT NOTE - FOLLOW UP  Pharmacy Consult for Vancomycin, Zosyn Indication: rule out sepsis  Allergies  Allergen Reactions  . Cymbalta [Duloxetine Hcl] Other (See Comments)    Confused, paralyzed from waist up   . Statins Other (See Comments)    Leg pains    Patient Measurements: Height: 6' (182.9 cm) Weight: 153 lb 3.5 oz (69.5 kg) IBW/kg (Calculated) : 77.6  Vital Signs: Temp: 98.2 F (36.8 C) (02/17 0759) Temp Source: Oral (02/17 0759) BP: 129/70 mmHg (02/17 0800) Pulse Rate: 116 (02/17 0800) Intake/Output from previous day: 02/16 0701 - 02/17 0700 In: 600 [I.V.:350; IV Piggyback:250] Out: 450 [Urine:450] Intake/Output from this shift: Total I/O In: 50 [I.V.:50] Out: -   Labs:  Recent Labs  01/18/2015 1917 08/17/14 0420  WBC 9.3 12.3*  HGB 10.4* 10.6*  PLT 250 222  CREATININE 2.69* 2.40*   Estimated Creatinine Clearance: 22.9 mL/min (by C-G formula based on Cr of 2.4).  Assessment: [name] [name] admitted 2/16 from NH with AMS, N/V, UTI with chronic foley, and progressive SOB.  PMH includes PVD and severe neuropathy from DM, HTN,  Afib, aortic dissection, MI s/p CABG, and CKD.  LFTs on admission noted to be significantly elevated; CCS consulted but patient is not a surgical candidate.  Pharmacy consulted to dose vancomycin and zosyn for sepsis.  2/16 >> Vancomcyin >> 2/16 >> Zosyn >>  2/16 >> Metronidazole >>  Today, 08/17/2014:   Tmax: 100.3  WBCs: increased, 12.3  Renal: CKD-III.  SCr improved to 2.4 with CrCl ~ 23 ml/min   Goal of Therapy:  Vancomycin trough level 15-20 mcg/ml  Appropriate abx dosing, eradication of infection.   Plan:   Increase to Zosyn 3.375g IV Q8H infused over 4hrs.  Continue Vancomycin 1 g IV q48h.  Measure Vanc trough at steady state.  Follow up renal fxn, culture results, and clinical course.  MD, Please consider d/c metronidazole if suspicion for Cdiff is low. Zosyn will cover other anaerobes well.    Lynann Beaverhristine Almyra Birman PharmD, BCPS Pager [phone] 08/17/2014 9:53 AM

## 2014-08-17 NOTE — Progress Notes (Signed)
CSW received referral that [facility]  CSW reviewed chart and noted that pt currently on non-rebreather mask and palliative medicine goal of care consult order.  CSW to await recommendations from palliative medicine goals of care meeting in order to assess as appropriate.  CSW to continue to follow.  Loletta SpecterSuzanna Kidd, MSW, LCSW Clinical Social Work [phone]

## 2014-08-17 NOTE — Progress Notes (Signed)
Subjective: He feels miserable and he is very unstable.  He took his mask off to talk to me and HR went up.  RR rate up to 39, Sats down into the 80's.  Recovered after placing FM back.  He is distended and says his abdomen hurts all over.  Objective: Vital signs in last 24 hours: Temp:  [97.5 F (36.4 C)-100.3 F (37.9 C)] 98.2 F (36.8 C) (02/17 0759) Pulse Rate:  [108-115] 111 (02/17 0700) Resp:  [14-30] 19 (02/17 0700) BP: (101-157)/(42-122) 101/73 mmHg (02/17 0700) SpO2:  [96 %-100 %] 97 % (02/17 0700) Weight:  [69.5 kg (153 lb 3.5 oz)-71.668 kg (158 lb)] 69.5 kg (153 lb 3.5 oz) (02/17 0015) Last BM Date: 08/28/2014 (pt's wife states she digitally impacts the pt x2/day) NPO TM 100.3, Tacyhycardic with stable BP Sats stable on NRB mask Na is up Creatinine is better Glucose down to 48 this AM.  No improvement in the LFT'S WBC is up some Film last PM shows marked distension of the stomach  Intake/Output from previous day: 02/16 0701 - 02/17 0700 In: 600 [I.V.:350; IV Piggyback:250] Out: 450 [Urine:450] Intake/Output this shift:    General appearance: alert, cooperative, moderate distress and He is uncomfortable and becomes dyspenic very quickly. Resp: rale, present, no wheezing GI: mildly distended, no BS, he is tender all over all quadrants with palpation.  Lab Results:   Recent Labs  08/04/2014 1917 08/17/14 0420  WBC 9.3 12.3*  HGB 10.4* 10.6*  HCT 33.5* 33.7*  PLT 250 222    BMET  Recent Labs  08/14/2014 1917 08/17/14 0420  NA 141 147*  K 4.3 4.7  CL 107 115*  CO2 24 19  GLUCOSE 139* 48*  BUN 80* 71*  CREATININE 2.69* 2.40*  CALCIUM 8.2* 8.0*   PT/INR  Recent Labs  08/17/14 0420  LABPROT 17.0*  INR 1.37     Recent Labs Lab 08/27/2014 1917 08/17/14 0420  AST 465* 389*  ALT 338* 344*  ALKPHOS 452* 426*  BILITOT 3.7* 4.0*  PROT 7.0 6.4  ALBUMIN 2.7* 2.4*     Lipase     Component Value Date/Time   LIPASE 22 07/01/2014 2215      Studies/Results: Dg Chest Port 1 View  08/17/2014   CLINICAL DATA:  Shortness of breath and altered mental status.  EXAM: PORTABLE CHEST - 1 VIEW  COMPARISON:  CT chest 06/20/2014. Plain films of the chest 07/01/2014 and 06/27/2014.  FINDINGS: Extensive pulmonary fibrosis and emphysema are again seen. No new airspace disease identified. Heart size is normal. No pneumothorax or pleural fluid is identified.  IMPRESSION: No acute finding in patient with extensive pulmonary fibrosis and emphysema.   Electronically Signed   By: Drusilla Kannerhomas  Dalessio M.D.   On: 08/15/2014 19:51   Dg Abd Portable 2v  08/08/2014   CLINICAL DATA:  Dyspnea altered mental status  EXAM: PORTABLE ABDOMEN - 2 VIEW  COMPARISON:  CT 07/02/2014  FINDINGS: Two portable views of the abdomen demonstrate marked gaseous distention of the stomach. Air is scattered throughout small and large bowel to the rectum. No free intraperitoneal air is evident.  IMPRESSION: Marked gaseous distention of the stomach.   Electronically Signed   By: Ellery Plunkaniel R Mitchell M.D.   On: 08/25/2014 22:55    Medications: . Chlorhexidine Gluconate Cloth  6 each Topical Q0600  . heparin  5,000 Units Subcutaneous 3 times per day  . insulin aspart  0-15 Units Subcutaneous Q6H  . methylPREDNISolone (SOLU-MEDROL) injection  60 mg Intravenous Q12H  . metronidazole  500 mg Intravenous Q8H  . mupirocin ointment  1 application Nasal BID  . piperacillin-tazobactam (ZOSYN)  IV  2.25 g Intravenous 3 times per day  . sodium chloride  3 mL Intravenous Q12H  . [START ON 08/18/2014] vancomycin  1,000 mg Intravenous Q48H    Assessment/Plan Nausea and vomiting with mental status changes. Sepsis  UTI with chronic foley Respiratory Insuffiencey requiring nonrebreather mask Elevated LFT's Chronic pain with peripheral neuropathy daily narcotics Chronic kidney disease AODM Hypertension Hx of aortic dissection/CABD/Afib DNR Heparin for DVT   Plan:  Note says family is not  interested in any surgery.  He is in no condition for anything right now anyway.  If we think the sepsis and LFT's are from his gallbladder he could be a candidate for percutaneous drain.  I will contact Dr. Cena Benton and talk with him.    LOS: 1 day    [name],[name] 08/17/2014   Agree with above. Appropriately, we have a very limited role.  Ovidio Kin, MD, [facility] Pager: [phone] Office phone:  [phone]

## 2014-08-17 NOTE — Progress Notes (Signed)
Hypoglycemic Event  CBG: 54  Treatment: D50 IV 25 mL  Symptoms: None  Follow-up CBG: Time:0145 CBG Result: 81  Possible Reasons for Event: Vomiting and Inadequate meal intake  Comments/MD notified: hypoglcemia protocol followed    Tracie HarrierYoung, Jannely Henthorn Nicole  Remember to initiate Hypoglycemia Order Set & complete

## 2014-08-18 ENCOUNTER — Encounter (HOSPITAL_COMMUNITY): Payer: Self-pay | Admitting: Pulmonary Disease

## 2014-08-18 DIAGNOSIS — J9621 Acute and chronic respiratory failure with hypoxia: Secondary | ICD-10-CM

## 2014-08-18 DIAGNOSIS — J8410 Pulmonary fibrosis, unspecified: Secondary | ICD-10-CM

## 2014-08-18 LAB — COMPREHENSIVE METABOLIC PANEL
ALBUMIN: 2.3 g/dL — AB (ref 3.5–5.2)
ALK PHOS: 439 U/L — AB (ref 39–117)
ALT: 296 U/L — ABNORMAL HIGH (ref 0–53)
AST: 181 U/L — AB (ref 0–37)
Anion gap: 14 (ref 5–15)
BUN: 75 mg/dL — AB (ref 6–23)
CALCIUM: 7.5 mg/dL — AB (ref 8.4–10.5)
CHLORIDE: 113 mmol/L — AB (ref 96–112)
CO2: 22 mmol/L (ref 19–32)
CREATININE: 2.24 mg/dL — AB (ref 0.50–1.35)
GFR calc Af Amer: 29 mL/min — ABNORMAL LOW (ref >90)
GFR calc non Af Amer: 25 mL/min — ABNORMAL LOW (ref >90)
Glucose, Bld: 114 mg/dL — ABNORMAL HIGH (ref 70–99)
Potassium: 3.9 mmol/L (ref 3.5–5.1)
Sodium: 149 mmol/L — ABNORMAL HIGH (ref 135–145)
TOTAL PROTEIN: 6.3 g/dL (ref 6.0–8.3)
Total Bilirubin: 3.3 mg/dL — ABNORMAL HIGH (ref 0.3–1.2)

## 2014-08-18 LAB — CBC
HEMATOCRIT: 34.3 % — AB (ref 39.0–52.0)
Hemoglobin: 10.5 g/dL — ABNORMAL LOW (ref 13.0–17.0)
MCH: 28.1 pg (ref 26.0–34.0)
MCHC: 30.6 g/dL (ref 30.0–36.0)
MCV: 91.7 fL (ref 78.0–100.0)
PLATELETS: 249 10*3/uL (ref 150–400)
RBC: 3.74 MIL/uL — ABNORMAL LOW (ref 4.22–5.81)
RDW: 17 % — AB (ref 11.5–15.5)
WBC: 11.6 10*3/uL — AB (ref 4.0–10.5)

## 2014-08-18 LAB — GLUCOSE, CAPILLARY
GLUCOSE-CAPILLARY: 105 mg/dL — AB (ref 70–99)
Glucose-Capillary: 123 mg/dL — ABNORMAL HIGH (ref 70–99)
Glucose-Capillary: 133 mg/dL — ABNORMAL HIGH (ref 70–99)
Glucose-Capillary: 135 mg/dL — ABNORMAL HIGH (ref 70–99)
Glucose-Capillary: 168 mg/dL — ABNORMAL HIGH (ref 70–99)
Glucose-Capillary: 171 mg/dL — ABNORMAL HIGH (ref 70–99)
Glucose-Capillary: 257 mg/dL — ABNORMAL HIGH (ref 70–99)

## 2014-08-18 LAB — URINE CULTURE

## 2014-08-18 MED ORDER — METOPROLOL TARTRATE 25 MG PO TABS
25.0000 mg | ORAL_TABLET | Freq: Two times a day (BID) | ORAL | Status: DC
  Administered 2014-08-18 (×2): 25 mg via ORAL
  Filled 2014-08-18 (×2): qty 1

## 2014-08-18 MED ORDER — METHYLPREDNISOLONE SODIUM SUCC 125 MG IJ SOLR
80.0000 mg | Freq: Two times a day (BID) | INTRAMUSCULAR | Status: DC
  Administered 2014-08-18 – 2014-08-20 (×5): 80 mg via INTRAVENOUS
  Filled 2014-08-18 (×5): qty 2

## 2014-08-18 MED ORDER — SENNA 8.6 MG PO TABS
1.0000 | ORAL_TABLET | Freq: Every day | ORAL | Status: DC
  Administered 2014-08-18 – 2014-08-20 (×3): 8.6 mg via ORAL
  Filled 2014-08-18 (×3): qty 1

## 2014-08-18 MED ORDER — IPRATROPIUM-ALBUTEROL 0.5-2.5 (3) MG/3ML IN SOLN
3.0000 mL | Freq: Four times a day (QID) | RESPIRATORY_TRACT | Status: DC
  Administered 2014-08-18 – 2014-08-19 (×4): 3 mL via RESPIRATORY_TRACT
  Filled 2014-08-18 (×4): qty 3

## 2014-08-18 MED ORDER — VANCOMYCIN HCL IN DEXTROSE 750-5 MG/150ML-% IV SOLN
750.0000 mg | INTRAVENOUS | Status: DC
  Administered 2014-08-18 – 2014-08-19 (×2): 750 mg via INTRAVENOUS
  Filled 2014-08-18 (×2): qty 150

## 2014-08-18 NOTE — Progress Notes (Signed)
Clinical Social Work Department BRIEF PSYCHOSOCIAL ASSESSMENT 08/18/2014  Patient:  [name] [name], [name] [name]     Account Number:  [id number]     [place] date:  08/08/2014  Clinical Social Worker:  Maryln Manuel  Date/Time:  08/18/2014 03:40 PM  Referred by:  Physician  Date Referred:  08/18/2014 Referred for  SNF Placement   Other Referral:   Interview type:  Patient Other interview type:   and patient wife at bedside    PSYCHOSOCIAL DATA Living Status:  FACILITY Admitted from facility:  [facility] Level of care:  [place] Primary support name:  Santiago Glad Cammarano/wife/ [phone] Primary support relationship to patient:  SPOUSE Degree of support available:   strong    CURRENT CONCERNS Current Concerns  Post-Acute Placement   Other Concerns:    SOCIAL WORK ASSESSMENT / PLAN CSW received referral that pt admitted from [facility] and [facility].    CSW met with pt and pt wife at bedside. Pt curently on non-rebreather mask which made it difficult for pt to participate in assessment. Pt wife confirmed that pt admitted from [facility]. Pt wife shared that pt and pt wife have been fairly satisifed with pt care at facility. Pt wife reports that disposition plan from this hospitalization will be dependent on pt outcomes. CSW provided support as pt wife discussed that pt has had multiple hospitalizations in the past few months and nothing has been very "normal" for pt and pt wife. CSW encouraged pt wife to participate in self care and pt wife acknowledged and stated that she attempts to go home every day.    CSW to continue to follow to continue to assess for appropriate disposition. Per Ritta Slot, pt wife did not hold pt bed at [facility].    CSW to continue to follow.   Assessment/plan status:  Psychosocial Support/Ongoing Assessment of Needs Other assessment/ plan:   discharge planning   Information/referral to community  resources:   disposition pending at this time    PATIENT'S/FAMILY'S RESPONSE TO PLAN OF CARE: Pt alert and oriented x 4, but had eyes closed throughout assessment and it was difficult for pt to speak given non-rebreather mask sliding when he spoke. Pt wife supportive and actively involved in pt care. Pt and pt wife are taking pt hospitalization one step at a time and pt disposition will be dependent on pt progress.   Alison Murray, MSW, [place] [facility] [phone]

## 2014-08-18 NOTE — Progress Notes (Signed)
[facility] PROGRESS NOTE  [name] WCH:[id number] DOB: October 11, 1928 DOA: 08/22/2014 PCP: Tawanna Solo, MD  Assessment/Plan: Principal Problem:   Sepsis - Source most likely secondary to urinary source given urinalysis - Continue broad-spectrum antibiotics (vancomycin and Zosyn) and monitor closely in stepdown/ICU area - Place on gentle fluid hydration - Patient has had hard formed stools as such index of suspicion for c diff infection negative.  UTI - Patient has chronic indwelling Foley. Given UTI will recommend changing Foley - Continue broad-spectrum antibiotics - Urine cultures pending  Active Problems:   CAD - CABG x 4 in 2006 -No chest pain currently reported    Neurogenic bladder   Pulmonary fibrosis - Reportedly patient developed trouble breathing after being placed on amiodarone. It is presumed the patient had some pulmonary side effects to amiodarone per my discussion with wife. - Consulted pulmonology for medical optimization    H/O aortic dissection - No chest pain/back pain reported    Acute encephalopathy - Most likely secondary to sepsis from infectious etiology - Improving    Chronic atrial fibrillation - Most likely not well controlled secondary to active infection. Will increase beta blocker dose today. - Per my discussion with wife patient was on amiodarone but developed problems with respiration and was subsequently diagnosed with pulmonary fibrosis thought to be secondary to amiodarone.     Diabetes mellitus type 2, uncontrolled - Nursing reports patient recently hypoglycemic - We'll place on D5 1 half normal saline while nothing by mouth with CBG checks every 4 hours    Acute and chronic respiratory failure (acute-on-chronic) - Saturating well with nonrebreather - With last range from 97 200%. Reportedly patient desaturated when he took nonrebreather off to speak with surgeon. - We will continue supplemental oxygen and reassess   Constipation - Pt does not have SBO reported on abdominal imaging. Had BM of which wife assisted.  Discussed using medication to help patient has BM. I suspect that his constipation is affecting also his breathing. Wife is in agreement and would like to try laxatives    LFT elevation - Could be related to sepsis although alk phosphatase elevated as well raising suspicion for gall stones. General surgery on board. I suspect that source of infection most likely from UTI as such we'll not make recommendations for percutaneous drainage of gall bladder - LFT's trending down currently  Code Status: DNR/DNI Family Communication: discussed with wife at bedside. Disposition Plan: Monitor in stepdown/ICU area   Consultants:  General surgery  Pulmonologist  Procedures:  None  Antibiotics:  Vancomycin and Zosyn  Metronidazole  HPI/Subjective: Discussed with wife. Patient tried to have BMs this morning. Wife assisted. Per my discussion with palliative care team wife was not allowing laxatives at [facility].  Objective: Filed Vitals:   08/18/14 0800  BP: 141/75  Pulse: 120  Temp: 97.2 F (36.2 C)  Resp: 34    Intake/Output Summary (Last 24 hours) at 08/18/14 0932 Last data filed at 08/18/14 0800  Gross per 24 hour  Intake   1550 ml  Output   1775 ml  Net   -225 ml   Filed Weights   08/09/2014 2056 08/17/14 0015  Weight: 71.668 kg (158 lb) 69.5 kg (153 lb 3.5 oz)    Exam:   General:  Patient in no acute distress, laying supine in bed with nonrebreather properly in place.  Cardiovascular: S1 and S2 present and within normal limits, no rubs  Respiratory: No wheezes, equal chest rise  Abdomen: Soft,  generalized tenderness with deep palpation, no guarding, no rebound tenderness  Musculoskeletal: No cyanosis or clubbing   Data Reviewed: Basic Metabolic Panel:  Recent Labs Lab 08/10/2014 1917 08/17/14 0420 08/18/14 0330  NA 141 147* 149*  K 4.3 4.7 3.9  CL 107  115* 113*  CO2 $Re'24 19 22  'gPW$ GLUCOSE 139* 48* 114*  BUN 80* 71* 75*  CREATININE 2.69* 2.40* 2.24*  CALCIUM 8.2* 8.0* 7.5*   Liver Function Tests:  Recent Labs Lab 08/17/2014 1917 08/17/14 0420 08/18/14 0330  AST 465* 389* 181*  ALT 338* 344* 296*  ALKPHOS 452* 426* 439*  BILITOT 3.7* 4.0* 3.3*  PROT 7.0 6.4 6.3  ALBUMIN 2.7* 2.4* 2.3*   No results for input(s): LIPASE, AMYLASE in the last 168 hours. No results for input(s): AMMONIA in the last 168 hours. CBC:  Recent Labs Lab 08/02/2014 1917 08/17/14 0420 08/18/14 0330  WBC 9.3 12.3* 11.6*  NEUTROABS 8.2* 11.2*  --   HGB 10.4* 10.6* 10.5*  HCT 33.5* 33.7* 34.3*  MCV 92.5 91.8 91.7  PLT 250 222 249   Cardiac Enzymes: No results for input(s): CKTOTAL, CKMB, CKMBINDEX, TROPONINI in the last 168 hours. BNP (last 3 results)  Recent Labs  06/29/14 0410 06/30/14 0415  BNP 283.2* 117.3*    ProBNP (last 3 results)  Recent Labs  05/18/14 1410 06/20/14 1202  PROBNP 4183.0* 14250.0*    CBG:  Recent Labs Lab 08/17/14 1637 08/17/14 2012 08/18/14 0048 08/18/14 0402 08/18/14 0748  GLUCAP 108* 133* 123* 105* 135*    Recent Results (from the past 240 hour(s))  Blood Culture (routine x 2)     Status: None (Preliminary result)   Collection Time: 08/07/2014  7:17 PM  Result Value Ref Range Status   Specimen Description BLOOD LEFT ARM  Final   Special Requests BOTTLES DRAWN AEROBIC AND ANAEROBIC 5CC  Final   Culture   Final           BLOOD CULTURE RECEIVED NO GROWTH TO DATE CULTURE WILL BE HELD FOR 5 DAYS BEFORE ISSUING A FINAL NEGATIVE REPORT Performed at [facility]    Report Status PENDING  Incomplete  Blood Culture (routine x 2)     Status: None (Preliminary result)   Collection Time: 08/20/2014  7:45 PM  Result Value Ref Range Status   Specimen Description BLOOD RIGHT ARM  Final   Special Requests BOTTLES DRAWN AEROBIC AND ANAEROBIC 5CC EACH  Final   Culture   Final           BLOOD CULTURE RECEIVED NO  GROWTH TO DATE CULTURE WILL BE HELD FOR 5 DAYS BEFORE ISSUING A FINAL NEGATIVE REPORT Performed at [facility]    Report Status PENDING  Incomplete  Urine culture     Status: None   Collection Time: 08/10/2014  8:03 PM  Result Value Ref Range Status   Specimen Description URINE, CATHETERIZED  Final   Special Requests NONE  Final   Colony Count   Final    >=100,000 COLONIES/ML Performed at [facility]    Culture   Final    Multiple bacterial morphotypes present, none predominant. Suggest appropriate recollection if clinically indicated. Performed at [facility]    Report Status 08/18/2014 FINAL  Final  MRSA PCR Screening     Status: Abnormal   Collection Time: 08/17/14 12:46 AM  Result Value Ref Range Status   MRSA by PCR POSITIVE (A) NEGATIVE Final    Comment:  The GeneXpert MRSA Assay (FDA approved for NASAL specimens only), is one component of a comprehensive MRSA colonization surveillance program. It is not intended to diagnose MRSA infection nor to guide or monitor treatment for MRSA infections. RESULT CALLED TO, READ BACK BY AND VERIFIED WITH: S. YOUNG RN AT [phone] ON 02.17.16 BY SHUEA      Studies: Dg Chest Port 1 View  08/22/2014   CLINICAL DATA:  Shortness of breath and altered mental status.  EXAM: PORTABLE CHEST - 1 VIEW  COMPARISON:  CT chest 06/20/2014. Plain films of the chest 07/01/2014 and 06/27/2014.  FINDINGS: Extensive pulmonary fibrosis and emphysema are again seen. No new airspace disease identified. Heart size is normal. No pneumothorax or pleural fluid is identified.  IMPRESSION: No acute finding in patient with extensive pulmonary fibrosis and emphysema.   Electronically Signed   By: Inge Rise M.D.   On: 08/15/2014 19:51   Dg Abd 2 Views  08/17/2014   CLINICAL DATA:  Abdominal distention.  Follow-up ileus.  EXAM: ABDOMEN - 2 VIEW  COMPARISON:  None.  FINDINGS: Soft tissue structures are unremarkable. Persistent  prominent distention of the stomach. Stool noted throughout the colon. No free air identified. Lumbar spine degenerative change. Prior L4-L5 fusion. Degenerative changes both hips. Aortoiliac atherosclerotic vascular disease. Calcific densities project over both kidneys.  IMPRESSION: Persistent gastric distention, no interim improvement.   Electronically Signed   By: Marcello Moores  Register   On: 08/17/2014 09:55   Dg Abd Portable 2v  08/19/2014   CLINICAL DATA:  Dyspnea altered mental status  EXAM: PORTABLE ABDOMEN - 2 VIEW  COMPARISON:  CT 07/02/2014  FINDINGS: Two portable views of the abdomen demonstrate marked gaseous distention of the stomach. Air is scattered throughout small and large bowel to the rectum. No free intraperitoneal air is evident.  IMPRESSION: Marked gaseous distention of the stomach.   Electronically Signed   By: Andreas Newport M.D.   On: 08/24/2014 22:55    Scheduled Meds: . Chlorhexidine Gluconate Cloth  6 each Topical Q0600  . heparin  5,000 Units Subcutaneous 3 times per day  . insulin aspart  0-15 Units Subcutaneous 6 times per day  . metoprolol tartrate  12.5 mg Oral BID  . metronidazole  500 mg Intravenous Q8H  . mupirocin ointment  1 application Nasal BID  . piperacillin-tazobactam (ZOSYN)  IV  3.375 g Intravenous Q8H  . sodium chloride  3 mL Intravenous Q12H  . vancomycin  1,000 mg Intravenous Q48H   Continuous Infusions: . sodium chloride Stopped (08/17/14 0900)  . dextrose 5 % and 0.45 % NaCl with KCl 20 mEq/L 50 mL/hr at 08/17/14 0957     Time spent: > 45 minutes    [name] [name]  [facility] Pager [phone] If 7PM-7AM, please contact night-coverage at [email or web address], password [facility] 08/18/2014, 9:32 AM  LOS: 2 days

## 2014-08-18 NOTE — Consult Note (Signed)
Name: [name] [name] MRN: [id number] DOB: 02-16-1929    ADMISSION DATE:  08/17/2014 CONSULTATION DATE:  08/18/14  REFERRING MD :  Dr. Cena Benton   CHIEF COMPLAINT:  Dyspnea   BRIEF PATIENT DESCRIPTION: 81 y/o M with multiple chronic medical conditions, SNF resident, who was admitted 2/16 with reports of N/V, lethargy and confusion.  Found to have significant hypoxia requiring NRB.  PCCM consulted for pulmonary evaluation of worsening dyspnea / hypoxemia in the setting of underlying fibrosis.    SIGNIFICANT EVENTS  12/15  Admit with hypoxic respiratory failure.  Rx'd with abx & diuretics w/o improvement.  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 2/16  Admit with n/v, lethargy, & confusion.  Found to have significant hypoxia requiring NRB.  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    He has had 4 prior admissions in the last 6 months to the hospital. On prior admit, he was seen by PCCM for evaluation of pulmonary fibrosis and work up was felt to be consistent with amiodarone related ILD.  At some point he was discharged to a SNF for rehab efforts. His wife describes an initial improvement in health but since the end of January he has had a gradual decline.  He has been unable to participate in PT, poor appetite etc.     ER evaluation found him with altered mental status, shortness of breath and hypoxia requiring NRB.  Temp of 99.1, VSS, labs: WBC 9.3, Hgb 10.4, Na 141, K 4.3, CO2 24, Cr 2.69 (previously 1.61 in July 2015), albumin 2.6, BNP 117 and urine positive for UTI.  He was admitted per [facility] for UTI.  The patient continued to require NRB and PCCM consulted for pulmonary evaluation of worsening dyspnea / hypoxemia in the setting of underlying fibrosis.    PAST MEDICAL HISTORY :   has a past medical history of Peripheral vascular disease; Myocardial infarction; Diabetes mellitus without complication; Hypertension; Coronary artery disease; Atrial fibrillation; Aortic dissection; AVM (arteriovenous malformation); Murmur; Hyperlipidemia; Neuropathy of left lower extremity; and S/P CABG (coronary artery bypass graft).  has past surgical history that includes Coronary artery bypass graft (02/11/2005); Back surgery; Atherectomy (09/15/2012); LEA DOPPLER (10/01/2012); Cardiac catheterization (02/04/2005); Cardiovascular stress test (12/16/2006); transthoracic echocardiogram (12/04/2011); and atherectomy (N/A, 09/15/2012).   HOME MEDICATIONS:  Prior to Admission medications   Medication Sig Start Date End Date Taking? Authorizing Provider  amLODipine (NORVASC) 2.5 MG tablet Take 3 tablets (7.5 mg total) by mouth daily. 06/30/14  Yes Catarina Hartshorn, MD  aspirin 81 MG tablet Take 81 mg by mouth daily with breakfast.    Yes Historical Provider, MD  calcium carbonate (OS-CAL - DOSED IN MG OF ELEMENTAL CALCIUM) 1250 MG tablet Take 1 tablet by mouth 2 (two) times daily with a meal.    Yes Historical Provider, MD  cefTRIAXone (ROCEPHIN) 1 G injection Inject 1 g into the vein daily. For 2 Days.   Yes Historical Provider, MD  ezetimibe (ZETIA) 10 MG tablet Take 1 tablet (10 mg total) by mouth daily. Patient taking differently: Take 10 mg by mouth daily with breakfast.  12/29/12  Yes Runell GessJonathan J Berry, MD  ferrous sulfate 325 (65 FE) MG tablet  Take 1 tablet (325 mg total) by mouth daily with breakfast. 05/21/14  Yes Maryann Mikhail, DO  furosemide (LASIX) 20 MG tablet Take 1 tablet (20 mg total) by mouth daily. 07/01/14  Yes Catarina Hartshornavid Tat, MD  gabapentin (NEURONTIN) 300 MG capsule Take 1 capsule (300 mg total) by mouth 2 (two) times daily. Patient taking differently: Take 300 mg by mouth 4 (four) times daily.  06/30/14  Yes Catarina Hartshornavid Tat, MD  insulin aspart (NOVOLOG) 100 UNIT/ML FlexPen Inject 7 Units into the skin 3 (three) times daily with meals. 07/05/14  Yes Nishant Dhungel, MD  Insulin Detemir (LEVEMIR) 100 UNIT/ML Pen Inject 30 Units into the skin every morning.   Yes Historical Provider, MD  methocarbamol (ROBAXIN) 500 MG tablet Take 500 mg by mouth 3 (three) times daily as needed for muscle spasms (muscle spasms).   Yes Historical Provider, MD  metoprolol (LOPRESSOR) 50 MG tablet Take 50 mg by mouth 2 (two) times daily. 10/28/13  Yes Lennette Biharihomas A Kelly, MD  Multiple Vitamin (MULTIVITAMIN WITH MINERALS) TABS tablet Take 1 tablet by mouth daily.   Yes Historical Provider, MD  multivitamin-iron-minerals-folic acid (THERAPEUTIC-M) TABS tablet Take 1 tablet by mouth daily.   Yes Historical Provider, MD  Omega-3 Fatty Acids (FISH OIL PO) Take 1 capsule by mouth 2 (two) times daily.   Yes Historical Provider, MD  Ondansetron HCl (ZOFRAN IV) Inject 4 mg into the vein daily as needed (nausea and vomiting).    Yes Historical Provider, MD  oxyCODONE-acetaminophen (PERCOCET/ROXICET) 5-325 MG per tablet Take 1-2 tablets by mouth See admin instructions. 2 tabs every morning, 1 tab in the afternoon and 2 tabs at bedtime Patient taking differently: Take 1-2 tablets by mouth every 4 (four) hours as needed.  07/05/14  Yes Nishant Dhungel, MD  potassium chloride (K-DUR,KLOR-CON) 10 MEQ tablet Take 1 tablet (10 mEq total) by mouth daily. 06/30/14  Yes Catarina Hartshornavid Tat, MD  Probiotic Product (PROBIOTIC DAILY) CAPS Take 1 capsule by mouth every evening.   Yes Historical Provider,  MD  vitamin C (ASCORBIC ACID) 500 MG tablet Take 500 mg by mouth daily.   Yes Historical Provider, MD  docusate sodium 100 MG CAPS Take 100 mg by mouth 2 (two) times daily. Patient not taking: Reported on 2014-09-11 06/30/14   Catarina Hartshornavid Tat, MD  insulin glargine (LANTUS) 100 UNIT/ML injection Inject 0.24 mLs (24 Units total) into the skin daily with breakfast. Patient not taking: Reported on 2014-09-11 07/05/14   Nishant Dhungel, MD  omeprazole (PRILOSEC) 40 MG capsule Take 40 mg by mouth daily.    Historical Provider, MD  predniSONE (DELTASONE) 20 MG tablet Take 2 tablets (40 mg total) by mouth daily with breakfast. Patient not taking: Reported on 07/01/2014 06/30/14   Catarina Hartshornavid Tat, MD   Allergies  Allergen Reactions  . Cymbalta [Duloxetine Hcl] Other (See Comments)    Confused, paralyzed from waist up   . Statins Other (See Comments)    Leg pains    FAMILY HISTORY:  family history includes Hypertension in his father.   SOCIAL HISTORY:  reports that he quit smoking about 22 years ago. He has never  used smokeless tobacco. He reports that he does not drink alcohol or use illicit drugs.  REVIEW OF SYSTEMS:  Gen: Denies fever, chills, weight change, fatigue, night sweats HEENT: Denies blurred vision, double vision, hearing loss, tinnitus, sinus congestion, rhinorrhea, sore throat, neck stiffness, dysphagia PULM: Denies cough, sputum production, hemoptysis, wheezing.  Reports SOB CV: Denies chest pain, edema, orthopnea, paroxysmal nocturnal dyspnea, palpitations GI: Denies abdominal pain, nausea, vomiting, diarrhea, hematochezia, melena, constipation, change in bowel habits GU: Denies dysuria, hematuria, polyuria, oliguria, urethral discharge Endocrine: Denies hot or cold intolerance, polyuria, polyphagia or appetite change Derm: Denies rash, dry skin, scaling or peeling skin change Heme: Denies easy bruising, bleeding, bleeding gums Neuro: Denies headache, numbness, weakness, slurred speech, loss  of memory or consciousness   SUBJECTIVE:   VITAL SIGNS: Temp:  [97.2 F (36.2 C)-98.5 F (36.9 C)] 97.2 F (36.2 C) (02/18 0800) Pulse Rate:  [113-121] 120 (02/18 0800) Resp:  [21-34] 34 (02/18 0800) BP: (130-161)/(64-119) 141/75 mmHg (02/18 0800) SpO2:  [87 %-100 %] 93 % (02/18 0800)  PHYSICAL EXAMINATION: General:  Frail elderly male, in NAD.  Appears to have declined from previous admissions.  Neuro:  Awake, alert, speech clear.  Very HOH. Paraplegia.  Moves UE's without issue.  HEENT:  MM pink/dry, no jvd Cardiovascular:  s1s2 irr irr Lungs:  Tachypnea, lungs bilaterally with fine inspiratory crackles  Abdomen:  Distended, bsx4 active Musculoskeletal:  No acute deformities  Skin:  Warm/dry, no edema, chronic LE changes c/w paraplegia    Recent Labs Lab 08/23/2014 1917 08/17/14 0420 08/18/14 0330  NA 141 147* 149*  K 4.3 4.7 3.9  CL 107 115* 113*  CO2 24 19 22   BUN 80* 71* 75*  CREATININE 2.69* 2.40* 2.24*  GLUCOSE 139* 48* 114*    Recent Labs Lab 08/13/2014 1917 08/17/14 0420 08/18/14 0330  HGB 10.4* 10.6* 10.5*  HCT 33.5* 33.7* 34.3*  WBC 9.3 12.3* 11.6*  PLT 250 222 249   Dg Chest Port 1 View  08/07/2014   CLINICAL DATA:  Shortness of breath and altered mental status.  EXAM: PORTABLE CHEST - 1 VIEW  COMPARISON:  CT chest 06/20/2014. Plain films of the chest 07/01/2014 and 06/27/2014.  FINDINGS: Extensive pulmonary fibrosis and emphysema are again seen. No new airspace disease identified. Heart size is normal. No pneumothorax or pleural fluid is identified.  IMPRESSION: No acute finding in patient with extensive pulmonary fibrosis and emphysema.   Electronically Signed   By: Drusilla Kanner M.D.   On: 08/01/2014 19:51   Dg Abd 2 Views  08/17/2014   CLINICAL DATA:  Abdominal distention.  Follow-up ileus.  EXAM: ABDOMEN - 2 VIEW  COMPARISON:  None.  FINDINGS: Soft tissue structures are unremarkable. Persistent prominent distention of the stomach. Stool noted  throughout the colon. No free air identified. Lumbar spine degenerative change. Prior L4-L5 fusion. Degenerative changes both hips. Aortoiliac atherosclerotic vascular disease. Calcific densities project over both kidneys.  IMPRESSION: Persistent gastric distention, no interim improvement.   Electronically Signed   By: Maisie Fus  Register   On: 08/17/2014 09:55   Dg Abd Portable 2v  08/23/2014   CLINICAL DATA:  Dyspnea altered mental status  EXAM: PORTABLE ABDOMEN - 2 VIEW  COMPARISON:  CT 07/02/2014  FINDINGS: Two portable views of the abdomen demonstrate marked gaseous distention of the stomach. Air is scattered throughout small and large bowel to the rectum. No free intraperitoneal air is evident.  IMPRESSION: Marked gaseous distention of the stomach.   Electronically Signed  By: Ellery Plunk M.D.   On: 08/20/2014 22:55    ASSESSMENT / PLAN:   Acute Hypoxic Respiratory Failure Interstitial Lung Disease  Severe Emphysema  PAH    81 y/o M with multiple underlying medical conditions admitted with urosepsis and hypoxemia.  CXR consistent with extensive bilateral fibrosis that does not appear much different from previous films.  Prior pulmonary work up was thought related to amiodarone toxicity of the lung and he responded well to IV steroids.  At the SNF, he was previously on 2L O2.  His steroids were at 40 mg QD prior to discharge at [facility].  Unfortunately, he appears to have significantly declined since his prior admissions.  However, I suspect his pulmonary decline is related to other system complications - chronic constipation, failure to thrive, poor PO intake / nutrition status, acute illness (sepsis/UTI) and AKI.  Fibrosis does not appear to have advanced since prior admissions.   Plan: Trial of increased steroids - solumedrol 80 Q12  Once able, wean steroids back to 40 mg QD Titrate O2 to support saturations > 88% PRN duoneb (no wheezing / bronchospasm on exam) Intermittent CXR    Pulmonary hygiene Abx for UTI Optimize nutrition as able PT / Mobilize as tolerated  Recommend bowel regimen (wife has been reluctant to use out of concern for patient "lying in stool")   Canary Brim, NP-C [facility] Pgr: [phone] or [phone] 08/18/2014, 11:26 AM  Attending Note:  I have examined patient, reviewed labs, studies and notes. I have discussed the case with B Ollis, and I agree with the data and plans as amended above.  Levy Pupa, MD, PhD 08/19/2014, 7:35 AM [facility] [phone] or if no answer [phone]

## 2014-08-18 NOTE — Progress Notes (Signed)
ANTIBIOTIC CONSULT NOTE - FOLLOW UP  Pharmacy Consult for Vancomycin, Zosyn Indication: rule out sepsis  Allergies  Allergen Reactions  . Cymbalta [Duloxetine Hcl] Other (See Comments)    Confused, paralyzed from waist up   . Statins Other (See Comments)    Leg pains    Patient Measurements: Height: 6' (182.9 cm) Weight: 153 lb 3.5 oz (69.5 kg) IBW/kg (Calculated) : 77.6  Vital Signs: Temp: 97.2 F (36.2 C) (02/18 0800) Temp Source: Axillary (02/18 0800) BP: 141/75 mmHg (02/18 0800) Pulse Rate: 120 (02/18 0800) Intake/Output from previous day: 02/17 0701 - 02/18 0700 In: 1550 [I.V.:1100; IV Piggyback:450] Out: 1775 [Urine:1775] Intake/Output from this shift: Total I/O In: 50 [I.V.:50] Out: -   Labs:  Recent Labs  08/02/2014 1917 08/17/14 0420 08/18/14 0330  WBC 9.3 12.3* 11.6*  HGB 10.4* 10.6* 10.5*  PLT 250 222 249  CREATININE 2.69* 2.40* 2.24*   Estimated Creatinine Clearance: 24.6 mL/min (by C-G formula based on Cr of 2.24).  Assessment: [age] yoM admitted 2/16 from NH with AMS, N/V, UTI with chronic foley, and progressive SOB.  PMH includes PVD and severe neuropathy from DM, HTN,  Afib, aortic dissection, MI s/p CABG, and CKD.  LFTs on admission noted to be significantly elevated; CCS consulted but patient is not a surgical candidate.  Pharmacy consulted to dose vancomycin and zosyn for sepsis.  2/16 >> Vancomcyin >> 2/16 >> Zosyn >>  2/16 >> Metronidazole >> 2/18  Today, 08/18/2014:   Tmax: afebrile  WBCs: 11.6  Renal: CKD-III.  SCr improved to 2.24 with CrCl ~ 25 ml/min   Goal of Therapy:  Vancomycin trough level 15-20 mcg/ml  Appropriate abx dosing, eradication of infection.   Plan:   Continue Zosyn 3.375g IV Q8H infused over 4hrs.  Increase to Vancomycin 750 mg IV q24h.  Measure Vanc trough at steady state.  Follow up renal fxn, culture results, and clinical course.  Lynann Beaverhristine Faten Frieson PharmD, BCPS Pager [phone] 08/18/2014 11:52  AM

## 2014-08-18 NOTE — Progress Notes (Signed)
Patient ZO:XWRUEA:[name] [name] [name]      DOB: 04/11/1929      VWU:[id number]   [facility] Progress Note    Subjective: States he feels a lot better today. Wife at bedside and worried about opioid withdrawal.  He states he feels much better than yesterday.  No BM.  No nausea. Frustrated some by [employer] but okay with continuing.    Filed Vitals:   08/18/14 0800  BP: 141/75  Pulse: 120  Temp: 97.2 F (36.2 C)  Resp: 34   Physical exam: GEN: alert, NAD HEENT: [place], mmm Abd; soft, distended  CBC    Component Value Date/Time   WBC 11.6* 08/18/2014 0330   RBC 3.74* 08/18/2014 0330   RBC 3.07* 05/19/2014 0802   HGB 10.5* 08/18/2014 0330   HCT 34.3* 08/18/2014 0330   PLT 249 08/18/2014 0330   MCV 91.7 08/18/2014 0330   MCH 28.1 08/18/2014 0330   MCHC 30.6 08/18/2014 0330   RDW 17.0* 08/18/2014 0330   LYMPHSABS 0.6* 08/17/2014 0420   MONOABS 0.4 08/17/2014 0420   EOSABS 0.0 08/17/2014 0420   BASOSABS 0.0 08/17/2014 0420    CMP     Component Value Date/Time   NA 149* 08/18/2014 0330   K 3.9 08/18/2014 0330   CL 113* 08/18/2014 0330   CO2 22 08/18/2014 0330   GLUCOSE 114* 08/18/2014 0330   BUN 75* 08/18/2014 0330   CREATININE 2.24* 08/18/2014 0330   CREATININE 1.61* 01/21/2014 1146   CALCIUM 7.5* 08/18/2014 0330   PROT 6.3 08/18/2014 0330   ALBUMIN 2.3* 08/18/2014 0330   AST 181* 08/18/2014 0330   ALT 296* 08/18/2014 0330   ALKPHOS 439* 08/18/2014 0330   BILITOT 3.3* 08/18/2014 0330   GFRNONAA 25* 08/18/2014 0330   GFRAA 29* 08/18/2014 0330     Assessment and plan: 81 yo male with PVD, DM, Afib, aortic dissection, ILD/PAH in setting of amio toxicity who was admitted 2/16 with N/V, encephalopathy.   1. Code Status:  DNR   2. GOC: Long discussion with both wife [name] [name] [name] and [name] [name] [name] today.  [name] [name] [name] anxious/frustrated and she admits irritable over care.  She feels like lots of misteps have occurred in [name] care in the past and is hypervigilant.  She feels they both suffer from chronic fatigue syndrome from past exposure (to which I am not clear what and to what extent), and is proactive about checking his meds, using vitamins and natural substances.  She has also not allowed bowel regimens to be given because she is worried about him lying in stool with his paraplegia. Prefers to manually disimpact him which she does 3x/week.  I was able to re-direct conversation some to our goals of care here and previously filled out MOST form indicating comfort care. Today [name] [name] [name] states he does not remember filling that out.  His goal currently is that he wishes to get back to where he was a few weeks ago in January.  Certainly [name] [name] [name] is very positive about progress he was making in January, and [name] [name] [name] is less so. This is seemingly possible though I instructed them that he remains high risk for setbacks and resultant frustration with care requirements. [name] [name] [name] wishes to continue current level of aggressive care towards this mean, and they would like to explore going home with home health/PT rather than SNF.  I will defer this issue to case management.  I have updated MOST form to DNR-L which more reflects our conversation here and placed copy  in chart.  Original given to [name] Braun.  I think that he would benefit from outpatient palliative care if this can be arranged at discharge.  Not sure if we have any meaningful outpatient palliative care services that are doing home visits at this time.  I will continue to follow along at least intermittently.    3. Symptom Management:  Chronic Pain- Would continue PRN fentanyl. Can transition back to oral regimen when able to tolerate PO Constipation/Ileus- See above with [name] [name]'s resistance to stool softeners/enema/bowel regimen. She wishes to manually disimpact him, which I am fine with her doing.  May need to consider suppository/enema in interim.    4. Psychosocial/Spiritual: Was last home in November. Wife states  he enjoys watching TV and doing crosswords.   Total Time: 50 minutes >50% of time spent in counseling and coordination of care regarding above  [name] [name] [name] D.O. Palliative Medicine Team at [facility]  Pager: [phone] Team Phone: [phone]

## 2014-08-19 DIAGNOSIS — J9621 Acute and chronic respiratory failure with hypoxia: Secondary | ICD-10-CM

## 2014-08-19 DIAGNOSIS — J8410 Pulmonary fibrosis, unspecified: Secondary | ICD-10-CM

## 2014-08-19 DIAGNOSIS — R0600 Dyspnea, unspecified: Secondary | ICD-10-CM

## 2014-08-19 DIAGNOSIS — R0689 Other abnormalities of breathing: Secondary | ICD-10-CM

## 2014-08-19 DIAGNOSIS — K567 Ileus, unspecified: Secondary | ICD-10-CM

## 2014-08-19 LAB — GLUCOSE, CAPILLARY
GLUCOSE-CAPILLARY: 175 mg/dL — AB (ref 70–99)
GLUCOSE-CAPILLARY: 285 mg/dL — AB (ref 70–99)
Glucose-Capillary: 325 mg/dL — ABNORMAL HIGH (ref 70–99)
Glucose-Capillary: 119 mg/dL — ABNORMAL HIGH (ref 70–99)
Glucose-Capillary: 253 mg/dL — ABNORMAL HIGH (ref 70–99)
Glucose-Capillary: 317 mg/dL — ABNORMAL HIGH (ref 70–99)

## 2014-08-19 MED ORDER — INSULIN ASPART 100 UNIT/ML ~~LOC~~ SOLN: 0.0000 [IU] | Freq: Three times a day (TID) | SUBCUTANEOUS | Status: DC

## 2014-08-19 MED ORDER — GLUCERNA SHAKE PO LIQD
237.0000 mL | Freq: Three times a day (TID) | ORAL | Status: DC
  Administered 2014-08-19 (×2): 237 mL via ORAL
  Filled 2014-08-19 (×6): qty 237

## 2014-08-19 MED ORDER — INSULIN ASPART 100 UNIT/ML ~~LOC~~ SOLN
0.0000 [IU] | Freq: Every day | SUBCUTANEOUS | Status: DC
  Administered 2014-08-19: 3 [IU] via SUBCUTANEOUS

## 2014-08-19 MED ORDER — FENTANYL CITRATE 0.05 MG/ML IJ SOLN
25.0000 ug | INTRAMUSCULAR | Status: DC | PRN
  Administered 2014-08-20: 60 ug via INTRAVENOUS
  Administered 2014-08-20 (×3): 50 ug via INTRAVENOUS
  Filled 2014-08-19 (×4): qty 2

## 2014-08-19 MED ORDER — ALBUTEROL SULFATE (2.5 MG/3ML) 0.083% IN NEBU: 2.5000 mg | INHALATION_SOLUTION | RESPIRATORY_TRACT | Status: DC | PRN

## 2014-08-19 MED ORDER — INSULIN ASPART 100 UNIT/ML ~~LOC~~ SOLN
0.0000 [IU] | Freq: Three times a day (TID) | SUBCUTANEOUS | Status: DC
  Administered 2014-08-19: 9 [IU] via SUBCUTANEOUS
  Administered 2014-08-19: 5 [IU] via SUBCUTANEOUS

## 2014-08-19 MED ORDER — METOPROLOL TARTRATE 25 MG PO TABS
50.0000 mg | ORAL_TABLET | Freq: Two times a day (BID) | ORAL | Status: DC
  Administered 2014-08-19 – 2014-08-20 (×3): 50 mg via ORAL
  Filled 2014-08-19 (×4): qty 2

## 2014-08-19 MED ORDER — ADULT MULTIVITAMIN W/MINERALS CH
1.0000 | ORAL_TABLET | Freq: Every day | ORAL | Status: DC
  Administered 2014-08-19 – 2014-08-20 (×2): 1 via ORAL
  Filled 2014-08-19 (×2): qty 1

## 2014-08-19 NOTE — Progress Notes (Signed)
"  These last nights have been a horror" (referring to patient's breathing) and "I guess I'll have to talk to my wife tomorrow"  ( talking about taking medication that would make patient more comfortable).   These were Mr. Witherington's words to me as he was explaining how uncomfortable and short of breath he has been.  I also called the wife and explained to her what BiPap was and how her husbands oxygen levels were very low and he was needing medication to make his breathing more comfortable.  She asked me several times what bipap was and also said that her husband was going to "hang on".  Currently palliative is consulted and has seen patient and Dr.Smith from [facility] is aware of patient status with orders received. Will continue patient on NRB mask and intermittent doses of Fentanyl. Patient is refusing Bipap at the present.

## 2014-08-19 NOTE — Progress Notes (Signed)
[facility] PROGRESS NOTE  [name] [name] JGG:[id number] DOB: 1929/05/14 DOA: 08/26/2014 PCP: Tawanna Solo, MD  Assessment/Plan: Principal Problem:   Sepsis - Source most likely secondary to urinary source given urinalysis - Continue broad-spectrum antibiotics (vancomycin and Zosyn) and monitor closely in stepdown/ICU area - Advance diet - Patient has had hard formed stools as such index of suspicion for c diff infection negative.  UTI - Patient has chronic indwelling Foley. Discussed changing foley with nursing  - Continue broad-spectrum antibiotics - Urine cultures pending  Active Problems:   CAD - CABG x 4 in 2006 -No chest pain currently reported    Neurogenic bladder   Pulmonary fibrosis - Reportedly patient developed trouble breathing after being placed on amiodarone. It is presumed the patient had some pulmonary side effects to amiodarone per my discussion with wife. - Consulted pulmonology for medical optimization    H/O aortic dissection - No chest pain/back pain reported    Acute encephalopathy - Most likely secondary to sepsis from infectious etiology - Improving    Chronic atrial fibrillation - Most likely not well controlled secondary to active infection. Will increase beta blocker dose today. - Per my discussion with wife patient was on amiodarone but developed problems with respiration and was subsequently diagnosed with pulmonary fibrosis thought to be secondary to amiodarone.     Diabetes mellitus type 2, uncontrolled - advance diet to diabetic diet - Change to SSI (sensitive scale)    Acute and chronic respiratory failure (acute-on-chronic) - Saturating well with nonrebreather - Pulmonology on board and managing.   Constipation - improving patient currently on laxative    LFT elevation - Could be related to sepsis although alk phosphatase elevated as well raising suspicion for gall stones. General surgery on board. I suspect that source  of infection most likely from UTI as such we'll not make recommendations for percutaneous drainage of gall bladder - LFT's trending down currently  Code Status: DNR/DNI Family Communication: discussed with wife at bedside. Disposition Plan: Monitor in stepdown/ICU area   Consultants:  General surgery  Pulmonologist  Procedures:  None  Antibiotics:  Vancomycin and Zosyn  Metronidazole>>08/18/14  HPI/Subjective: Pt currently feels better. No new complaints this morning.  Objective: Filed Vitals:   08/19/14 0800  BP: 133/71  Pulse: 119  Temp: 97.9 F (36.6 C)  Resp: 22    Intake/Output Summary (Last 24 hours) at 08/19/14 0911 Last data filed at 08/19/14 0800  Gross per 24 hour  Intake   1450 ml  Output    625 ml  Net    825 ml   Filed Weights   08/10/2014 2056 08/17/14 0015  Weight: 71.668 kg (158 lb) 69.5 kg (153 lb 3.5 oz)    Exam:   General:  Patient in no acute distress, alert and awake  Cardiovascular: S1 and S2 present and within normal limits, no rubs  Respiratory: No wheezes, equal chest rise  Abdomen: Soft, generalized tenderness with deep palpation, no guarding, no rebound tenderness  Musculoskeletal: No cyanosis or clubbing   Data Reviewed: Basic Metabolic Panel:  Recent Labs Lab 08/10/2014 1917 08/17/14 0420 08/18/14 0330  NA 141 147* 149*  K 4.3 4.7 3.9  CL 107 115* 113*  CO2 _0 GLUCOSE 139* 48* 114*  BUN 80* 71* 75*  CREATININE 2.69* 2.40* 2.24*  CALCIUM 8.2* 8.0* 7.5*   Liver Function Tests:  Recent Labs Lab 08/24/2014 1917 08/17/14 0420 08/18/14 0330  AST 465* 389* 181*  ALT 338*  344* 296*  ALKPHOS 452* 426* 439*  BILITOT 3.7* 4.0* 3.3*  PROT 7.0 6.4 6.3  ALBUMIN 2.7* 2.4* 2.3*   No results for input(s): LIPASE, AMYLASE in the last 168 hours. No results for input(s): AMMONIA in the last 168 hours. CBC:  Recent Labs Lab 08/28/2014 1917 08/17/14 0420 08/18/14 0330  WBC 9.3 12.3* 11.6*  NEUTROABS 8.2* 11.2*   --   HGB 10.4* 10.6* 10.5*  HCT 33.5* 33.7* 34.3*  MCV 92.5 91.8 91.7  PLT 250 222 249   Cardiac Enzymes: No results for input(s): CKTOTAL, CKMB, CKMBINDEX, TROPONINI in the last 168 hours. BNP (last 3 results)  Recent Labs  06/29/14 0410 06/30/14 0415  BNP 283.2* 117.3*    ProBNP (last 3 results)  Recent Labs  05/18/14 1410 06/20/14 1202  PROBNP 4183.0* 14250.0*    CBG:  Recent Labs Lab 08/18/14 1640 08/18/14 1929 08/18/14 2318 08/19/14 0426 08/19/14 0749  GLUCAP 171* 257* 325* 175* 119*    Recent Results (from the past 240 hour(s))  Blood Culture (routine x 2)     Status: None (Preliminary result)   Collection Time: 08/10/2014  7:17 PM  Result Value Ref Range Status   Specimen Description BLOOD LEFT ARM  Final   Special Requests BOTTLES DRAWN AEROBIC AND ANAEROBIC 5CC  Final   Culture   Final    GRAM POSITIVE COCCI IN CHAINS Note: Gram Stain Report Called to,Read Back By and Verified With: Charma Igo 08/19/14 AT 0230 RIDK Performed at [facility]    Report Status PENDING  Incomplete  Blood Culture (routine x 2)     Status: None (Preliminary result)   Collection Time: 08/25/2014  7:45 PM  Result Value Ref Range Status   Specimen Description BLOOD RIGHT ARM  Final   Special Requests BOTTLES DRAWN AEROBIC AND ANAEROBIC 5CC EACH  Final   Culture   Final           BLOOD CULTURE RECEIVED NO GROWTH TO DATE CULTURE WILL BE HELD FOR 5 DAYS BEFORE ISSUING A FINAL NEGATIVE REPORT Performed at [facility]    Report Status PENDING  Incomplete  Urine culture     Status: None   Collection Time: 08/10/2014  8:03 PM  Result Value Ref Range Status   Specimen Description URINE, CATHETERIZED  Final   Special Requests NONE  Final   Colony Count   Final    >=100,000 COLONIES/ML Performed at [facility]    Culture   Final    Multiple bacterial morphotypes present, none predominant. Suggest appropriate recollection if clinically  indicated. Performed at [facility]    Report Status 08/18/2014 FINAL  Final  MRSA PCR Screening     Status: Abnormal   Collection Time: 08/17/14 12:46 AM  Result Value Ref Range Status   MRSA by PCR POSITIVE (A) NEGATIVE Final    Comment:        The GeneXpert MRSA Assay (FDA approved for NASAL specimens only), is one component of a comprehensive MRSA colonization surveillance program. It is not intended to diagnose MRSA infection nor to guide or monitor treatment for MRSA infections. RESULT CALLED TO, READ BACK BY AND VERIFIED WITH: S. YOUNG RN AT [phone] ON 02.17.16 BY SHUEA      Studies: Dg Abd 2 Views  08/17/2014   CLINICAL DATA:  Abdominal distention.  Follow-up ileus.  EXAM: ABDOMEN - 2 VIEW  COMPARISON:  None.  FINDINGS: Soft tissue structures are unremarkable. Persistent prominent distention  of the stomach. Stool noted throughout the colon. No free air identified. Lumbar spine degenerative change. Prior L4-L5 fusion. Degenerative changes both hips. Aortoiliac atherosclerotic vascular disease. Calcific densities project over both kidneys.  IMPRESSION: Persistent gastric distention, no interim improvement.   Electronically Signed   By: Marcello Moores  Register   On: 08/17/2014 09:55    Scheduled Meds: . Chlorhexidine Gluconate Cloth  6 each Topical Q0600  . feeding supplement (GLUCERNA SHAKE)  237 mL Oral TID BM  . heparin  5,000 Units Subcutaneous 3 times per day  . insulin aspart  0-15 Units Subcutaneous 6 times per day  . ipratropium-albuterol  3 mL Nebulization Q6H  . methylPREDNISolone (SOLU-MEDROL) injection  80 mg Intravenous Q12H  . metoprolol tartrate  50 mg Oral BID  . multivitamin with minerals  1 tablet Oral Daily  . mupirocin ointment  1 application Nasal BID  . piperacillin-tazobactam (ZOSYN)  IV  3.375 g Intravenous Q8H  . senna  1 tablet Oral Daily  . sodium chloride  3 mL Intravenous Q12H  . vancomycin  750 mg Intravenous Q24H   Continuous Infusions: .  sodium chloride Stopped (08/17/14 0900)  . dextrose 5 % and 0.45 % NaCl with KCl 20 mEq/L 50 mL/hr at 08/19/14 0500     Time spent: > 35 minutes    Velvet Bathe  [facility] Pager [phone] If 7PM-7AM, please contact night-coverage at [email or web address], password [facility] 08/19/2014, 9:11 AM  LOS: 3 days

## 2014-08-19 NOTE — Progress Notes (Signed)
Pt asking for more Oxygen because he feels like he can't breathe, RN called and got PRN order for BIPAP but Pt is refusing to wear, pt states that he wants more meds to make him comfortable.  RT will monitor and assess as needed.

## 2014-08-19 NOTE — Progress Notes (Signed)
MD notified of pt's low urine output over past 24 hrs. No orders received. Will continue to monitor.

## 2014-08-19 NOTE — Progress Notes (Signed)
Patient WU:JWJXBJ:[name] [name]      DOB: 03/14/1929      YNW:[id number]   Palliative Medicine Team at [facility] Progress Note    Subjective: More lethargic. Denies pain or dyspnea but admits he is working harder to breath. Difficult for him to participate in any conversation   Filed Vitals:   08/19/14 0800  BP: 133/71  Pulse: 119  Temp: 97.9 F (36.6 C)  Resp: 22   Physical exam: GEN: more lethargic and increased WOB HEENT: [place], mmm CV: Tachy LUNGS: decreased breath sounds Abd; soft, distended   CBC    Component Value Date/Time   WBC 11.6* 08/18/2014 0330   RBC 3.74* 08/18/2014 0330   RBC 3.07* 05/19/2014 0802   HGB 10.5* 08/18/2014 0330   HCT 34.3* 08/18/2014 0330   PLT 249 08/18/2014 0330   MCV 91.7 08/18/2014 0330   MCH 28.1 08/18/2014 0330   MCHC 30.6 08/18/2014 0330   RDW 17.0* 08/18/2014 0330   LYMPHSABS 0.6* 08/17/2014 0420   MONOABS 0.4 08/17/2014 0420   EOSABS 0.0 08/17/2014 0420   BASOSABS 0.0 08/17/2014 0420    CMP     Component Value Date/Time   NA 149* 08/18/2014 0330   K 3.9 08/18/2014 0330   CL 113* 08/18/2014 0330   CO2 22 08/18/2014 0330   GLUCOSE 114* 08/18/2014 0330   BUN 75* 08/18/2014 0330   CREATININE 2.24* 08/18/2014 0330   CREATININE 1.61* 01/21/2014 1146   CALCIUM 7.5* 08/18/2014 0330   PROT 6.3 08/18/2014 0330   ALBUMIN 2.3* 08/18/2014 0330   AST 181* 08/18/2014 0330   ALT 296* 08/18/2014 0330   ALKPHOS 439* 08/18/2014 0330   BILITOT 3.3* 08/18/2014 0330   GFRNONAA 25* 08/18/2014 0330   GFRAA 29* 08/18/2014 0330      Assessment and plan: 81 yo male with PVD, DM, Afib, aortic dissection, ILD/PAH in setting of amio toxicity who was admitted 2/16 with N/V, encephalopathy.   1. Code Status:  DNR   2. GOC: See previous documentation.  [name] [name] looks worse today despite ongoing aggressive care.  Addition of IV steroids does not seem to have improved his hypoxic resp failure at this point.  I attempted to reach [name] [name] this  morning but unable to do so. I left message for her to call me back. When I spoke to her a few days ago, I discussed that my biggest concern was his hypoxia.  As documented in my consultation note, this was not her largest concern and she deflected conversation elsewhere. I think ongoing conversations with her will be difficult as ultimately I think she is struggling with decline and perhaps anticipatory grief about losing [name] [name]. I suspect if we do not see him make progress in next 24 hours, that we are likely looking at end of life care and in-patient death.   3. Symptom Management:  Chronic Pain- Would continue PRN fentanyl.  Constipation/Ileus- [name] [name] manually disimpacts and usually refuses bowel regimen for him because she does not want him laying in stool. I agree that bowel regimen is necessary but I do not think [name] [name] will approve this.   Dyspnea- can use fentanyl PRN.    4. Psychosocial/Spiritual: Was last home in November. Wife states he enjoys watching TV and doing crosswords.    [name] D.O. Palliative Medicine Team at [facility]  Pager: [phone] Team Phone: [phone]

## 2014-08-19 NOTE — Progress Notes (Addendum)
Name: [name] [name] MRN: [id number] DOB: 08/09/1928    ADMISSION DATE:  08/05/2014 CONSULTATION DATE:  08/18/14  REFERRING MD :  Dr. Cena BentonVega   CHIEF COMPLAINT:  Dyspnea   BRIEF PATIENT DESCRIPTION: 81 y/o M with multiple chronic medical conditions, SNF resident, who was admitted 2/16 with reports of N/V, lethargy and confusion.  Found to have significant hypoxia requiring NRB.  PCCM consulted for pulmonary evaluation of worsening dyspnea / hypoxemia in the setting of underlying fibrosis.    SIGNIFICANT EVENTS  12/15  Admit with hypoxic respiratory failure.  Rx'd with abx & diuretics w/o improvement.  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 2/16  Admit with n/v, lethargy, & confusion.  Found to have significant hypoxia requiring NRB.  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    He has had 4 prior admissions in the last 6 months to the hospital. On prior admit, he was seen by PCCM for evaluation of pulmonary fibrosis and work up was felt to be consistent with amiodarone related ILD.  At some point he was discharged to a SNF for rehab efforts. His wife describes an initial improvement in health but since the end of January he has had a gradual decline.  He has been unable to participate in PT, poor appetite etc.     ER evaluation found him with altered mental status, shortness of breath and hypoxia requiring NRB.  Temp of 99.1, VSS, labs: WBC 9.3, Hgb 10.4, Na 141, K 4.3, CO2 24, Cr 2.69 (previously 1.61 in July 2015), albumin 2.6, BNP 117 and urine positive for UTI.  [facility].  The patient continued to require NRB and PCCM consulted for pulmonary evaluation of worsening dyspnea / hypoxemia in the setting of underlying fibrosis.     SUBJECTIVE:  Feels better, good appetite,  Remains on high flow O2  VITAL SIGNS: Temp:  [97.2 F (36.2 C)-97.9 F (36.6 C)] 97.9 F (36.6 C) (02/19 0800) Pulse Rate:  [112-127] 119 (02/19 0800) Resp:  [22-36] 22 (02/19 0800) BP: (121-187)/(62-116) 133/71 mmHg (02/19 0800) SpO2:  [73 %-100 %] 100 % (02/19 0800)  PHYSICAL EXAMINATION: General:  Frail elderly male, in NAD.  Appears to have declined from previous admissions.  Neuro:  Awake, alert, speech clear.  Very HOH. Paraplegia.  Moves UE's without issue.  HEENT:  MM pink/dry, no jvd Cardiovascular:  s1s2 irr irr Lungs:  Tachypnea, lungs bilaterally with fine inspiratory crackles  Abdomen:  Distended, bsx4 active Musculoskeletal:  No acute deformities  Skin:  Warm/dry, no edema, chronic LE changes c/w paraplegia    Recent Labs Lab 08/23/2014 1917 08/17/14 0420 08/18/14 0330  NA 141 147* 149*  K 4.3 4.7 3.9  CL 107 115* 113*  CO2 24 19 22   BUN 80* 71* 75*  CREATININE 2.69* 2.40* 2.24*  GLUCOSE 139* 48* 114*    Recent Labs Lab August 25, 2014 1917 08/17/14 0420 08/18/14 0330  HGB 10.4* 10.6* 10.5*  HCT 33.5* 33.7* 34.3*  WBC 9.3 12.3* 11.6*  PLT 250 222 249   Dg Abd 2 Views  08/17/2014   CLINICAL DATA:  Abdominal distention.  Follow-up ileus.  EXAM: ABDOMEN - 2 VIEW  COMPARISON:  None.  FINDINGS: Soft tissue structures are unremarkable. Persistent prominent distention of the stomach. Stool noted throughout the colon. No free air identified. Lumbar spine degenerative change. Prior  L4-L5 fusion. Degenerative changes both hips. Aortoiliac atherosclerotic vascular disease. Calcific densities project over both kidneys.  IMPRESSION: Persistent gastric distention, no interim improvement.   Electronically Signed   By: Maisie Fus  Register   On: 08/17/2014 09:55    ASSESSMENT / PLAN:   Acute Hypoxic Respiratory Failure Interstitial Lung Disease  Severe Emphysema  PAH    81 y/o M with multiple underlying medical conditions admitted with urosepsis and hypoxemia.  CXR consistent with extensive bilateral fibrosis that does not appear much different from previous films.  Prior pulmonary work up was thought related to amiodarone toxicity of the lung and he responded well to IV steroids.  At the SNF, he was previously on 2L O2.  His steroids were at 40 mg QD prior to discharge at [facility].  Unfortunately, he appears to have significantly declined since his prior admissions.  However, I suspect his pulmonary decline is related to other system complications - chronic constipation, failure to thrive, poor PO intake / nutrition status, acute illness (sepsis/UTI) and AKI.  Fibrosis does not appear to have advanced since prior admissions.   Plan: Continue solumedrol 80 Q12 for another day, then work to convert to Pred  qd Titrate O2 to support saturations > 88% PRN albuterol (no wheezing / bronchospasm on exam) Intermittent CXR  Pulmonary hygiene Abx for UTI >> would narrow from vanco + pip/tazo Optimize nutrition as able PT / Mobilize as tolerated  Recommend bowel regimen (wife has been reluctant to use out of concern for patient "lying in stool") Follow with Dr Kendrick Fries as an outpt - he has not been able to do so yet     Levy Pupa, MD, PhD 08/19/2014, 9:22 AM [facility] [phone] or if no answer [phone]

## 2014-08-19 NOTE — Progress Notes (Signed)
eLink Physician-Brief Progress Note Patient Name: [name] [name] DOB: 09/23/1928 MRN: [id number]   Date of Service  08/19/2014  HPI/Events of Note  Patient with severe pulm fibrosis c/o dyspnea.  He desaturates quickly when removes NRB mask.  Asking for help with dyspnea.  He is DNR    eICU Interventions  Trial of Bipap Increase fentanyl     Intervention Category Major Interventions: Hypoxemia - evaluation and management  [name] [name], [name] [name], P 08/19/2014, 10:49 PM

## 2014-08-20 ENCOUNTER — Inpatient Hospital Stay (HOSPITAL_COMMUNITY): Payer: Medicare Other

## 2014-08-20 LAB — CBC
HCT: 30 % — ABNORMAL LOW (ref 39.0–52.0)
Hemoglobin: 9.3 g/dL — ABNORMAL LOW (ref 13.0–17.0)
MCH: 28.4 pg (ref 26.0–34.0)
MCHC: 31 g/dL (ref 30.0–36.0)
MCV: 91.5 fL (ref 78.0–100.0)
Platelets: 218 10*3/uL (ref 150–400)
RBC: 3.28 MIL/uL — ABNORMAL LOW (ref 4.22–5.81)
RDW: 17.6 % — ABNORMAL HIGH (ref 11.5–15.5)
WBC: 12.9 10*3/uL — ABNORMAL HIGH (ref 4.0–10.5)

## 2014-08-20 LAB — BASIC METABOLIC PANEL
ANION GAP: 12 (ref 5–15)
Anion gap: 12 (ref 5–15)
BUN: 94 mg/dL — ABNORMAL HIGH (ref 6–23)
BUN: 116 mg/dL — AB (ref 6–23)
CHLORIDE: 115 mmol/L — AB (ref 96–112)
CO2: 18 mmol/L — ABNORMAL LOW (ref 19–32)
CO2: 18 mmol/L — AB (ref 19–32)
Calcium: 6.6 mg/dL — ABNORMAL LOW (ref 8.4–10.5)
Calcium: 6.4 mg/dL — CL (ref 8.4–10.5)
Chloride: 117 mmol/L — ABNORMAL HIGH (ref 96–112)
Creatinine, Ser: 2.54 mg/dL — ABNORMAL HIGH (ref 0.50–1.35)
Creatinine, Ser: 2.57 mg/dL — ABNORMAL HIGH (ref 0.50–1.35)
GFR calc Af Amer: 25 mL/min — ABNORMAL LOW (ref >90)
GFR calc non Af Amer: 22 mL/min — ABNORMAL LOW (ref >90)
GFR calc non Af Amer: 22 mL/min — ABNORMAL LOW (ref >90)
GFR, EST AFRICAN AMERICAN: 25 mL/min — AB (ref >90)
GLUCOSE: 381 mg/dL — AB (ref 70–99)
Glucose, Bld: 354 mg/dL — ABNORMAL HIGH (ref 70–99)
Potassium: 4.1 mmol/L (ref 3.5–5.1)
Potassium: 4.2 mmol/L (ref 3.5–5.1)
SODIUM: 145 mmol/L (ref 135–145)
Sodium: 147 mmol/L — ABNORMAL HIGH (ref 135–145)

## 2014-08-20 LAB — GLUCOSE, CAPILLARY
GLUCOSE-CAPILLARY: 423 mg/dL — AB (ref 70–99)
Glucose-Capillary: 454 mg/dL — ABNORMAL HIGH (ref 70–99)
Glucose-Capillary: 413 mg/dL — ABNORMAL HIGH (ref 70–99)
Glucose-Capillary: 341 mg/dL — ABNORMAL HIGH (ref 70–99)

## 2014-08-20 LAB — GLUCOSE, RANDOM: Glucose, Bld: 481 mg/dL — ABNORMAL HIGH (ref 70–99)

## 2014-08-20 MED ORDER — OXYCODONE HCL 5 MG PO TABS
5.0000 mg | ORAL_TABLET | Freq: Four times a day (QID) | ORAL | Status: DC | PRN
Start: 2014-08-20 — End: 2014-08-20

## 2014-08-20 MED ORDER — LORAZEPAM 2 MG/ML IJ SOLN
0.5000 mg | INTRAMUSCULAR | Status: DC | PRN
  Administered 2014-08-20 – 2014-08-21 (×3): 1 mg via INTRAVENOUS
  Filled 2014-08-20 (×3): qty 1

## 2014-08-20 MED ORDER — FENTANYL CITRATE 0.05 MG/ML IJ SOLN
12.5000 ug | Freq: Once | INTRAMUSCULAR | Status: AC
  Administered 2014-08-20: 12.5 ug via INTRAVENOUS
  Filled 2014-08-20: qty 2

## 2014-08-20 MED ORDER — FENTANYL BOLUS VIA INFUSION
30.0000 ug | INTRAVENOUS | Status: DC | PRN
  Administered 2014-08-20: 50 ug via INTRAVENOUS
  Administered 2014-08-20: 30 ug via INTRAVENOUS
  Administered 2014-08-20 (×2): 50 ug via INTRAVENOUS
  Administered 2014-08-20: 30 ug via INTRAVENOUS
  Administered 2014-08-21 (×2): 60 ug via INTRAVENOUS
  Administered 2014-08-21: 30 ug via INTRAVENOUS
  Filled 2014-08-20: qty 60

## 2014-08-20 MED ORDER — INSULIN DETEMIR 100 UNIT/ML ~~LOC~~ SOLN
5.0000 [IU] | Freq: Once | SUBCUTANEOUS | Status: AC
  Administered 2014-08-20: 5 [IU] via SUBCUTANEOUS
  Filled 2014-08-20: qty 0.05

## 2014-08-20 MED ORDER — INSULIN ASPART 100 UNIT/ML ~~LOC~~ SOLN
10.0000 [IU] | Freq: Once | SUBCUTANEOUS | Status: AC
  Administered 2014-08-20: 10 [IU] via SUBCUTANEOUS

## 2014-08-20 MED ORDER — LORAZEPAM 2 MG/ML IJ SOLN
INTRAMUSCULAR | Status: AC
  Administered 2014-08-20: 0.5 mg
  Filled 2014-08-20: qty 1

## 2014-08-20 MED ORDER — CALCIUM CARBONATE ANTACID 500 MG PO CHEW: 1.0000 | CHEWABLE_TABLET | Freq: Two times a day (BID) | ORAL | Status: DC

## 2014-08-20 MED ORDER — SORBITOL 70 % SOLN
960.0000 mL | TOPICAL_OIL | Freq: Once | ORAL | Status: DC
  Filled 2014-08-20: qty 240

## 2014-08-20 MED ORDER — LORAZEPAM 2 MG/ML IJ SOLN
1.0000 mg | Freq: Once | INTRAMUSCULAR | Status: AC
  Administered 2014-08-20: 1 mg via INTRAVENOUS

## 2014-08-20 MED ORDER — INSULIN ASPART 100 UNIT/ML ~~LOC~~ SOLN
0.0000 [IU] | Freq: Three times a day (TID) | SUBCUTANEOUS | Status: DC
  Administered 2014-08-20: 17 [IU] via SUBCUTANEOUS

## 2014-08-20 MED ORDER — FENTANYL CITRATE 0.05 MG/ML IJ SOLN
30.0000 ug/h | INTRAMUSCULAR | Status: DC
  Administered 2014-08-20 – 2014-08-21 (×2): 30 ug/h via INTRAVENOUS
  Filled 2014-08-20: qty 50

## 2014-08-20 MED ORDER — FENTANYL CITRATE 0.05 MG/ML IJ SOLN
INTRAMUSCULAR | Status: AC
  Filled 2014-08-20: qty 2

## 2014-08-20 MED ORDER — SODIUM CHLORIDE 0.9 % IV SOLN
INTRAVENOUS | Status: DC
  Administered 2014-08-21: 01:00:00 via INTRAVENOUS

## 2014-08-20 MED ORDER — FENTANYL BOLUS VIA INFUSION
50.0000 ug | Freq: Once | INTRAVENOUS | Status: AC
  Administered 2014-08-20: 50 ug via INTRAVENOUS
  Filled 2014-08-20: qty 50

## 2014-08-20 NOTE — Progress Notes (Signed)
CBG 423 this AM. Dr. Cena BentonVega notified and stat lab glucose ordered. Awaiting results of lab glucose and will treat accordingly per MD.

## 2014-08-20 NOTE — Progress Notes (Signed)
Patient WU:JWJXBJ:[name] [name]      DOB: 07/18/1928      YNW:[id number]   Palliative Medicine Team at [facility] Progress Note    Subjective: Feeling more SOB. Required increased dose of Fentanyl overnight and still feeling abdominal pain and worsening dyspnea today.  Refused BiPAP overnight.  Frustrated by care and again voicing to nursing that he wanted to be comfortable and how horrible this has been. Discussion today with him and his wife as below.     Filed Vitals:   08/20/14 1200  BP: 119/72  Pulse: 118  Temp: 98.4 F (36.9 C)  Resp: 35   Physical exam: GEN: alert, increased work of breathing today HEENT: , dry mm, NRB CV: tachy LUNGS: decreased breath sounds, accessory muscle use.   ABD: soft, mild distension, diffuse tenderness EXT: warm  CBC    Component Value Date/Time   WBC 12.9* 08/20/2014 0353   RBC 3.28* 08/20/2014 0353   RBC 3.07* 05/19/2014 0802   HGB 9.3* 08/20/2014 0353   HCT 30.0* 08/20/2014 0353   PLT 218 08/20/2014 0353   MCV 91.5 08/20/2014 0353   MCH 28.4 08/20/2014 0353   MCHC 31.0 08/20/2014 0353   RDW 17.6* 08/20/2014 0353   LYMPHSABS 0.6* 08/17/2014 0420   MONOABS 0.4 08/17/2014 0420   EOSABS 0.0 08/17/2014 0420   BASOSABS 0.0 08/17/2014 0420    CMP     Component Value Date/Time   NA 147* 08/20/2014 1401   K 4.2 08/20/2014 1401   CL 117* 08/20/2014 1401   CO2 18* 08/20/2014 1401   GLUCOSE 381* 08/20/2014 1401   BUN 116* 08/20/2014 1401   CREATININE 2.57* 08/20/2014 1401   CREATININE 1.61* 01/21/2014 1146   CALCIUM 6.4* 08/20/2014 1401   PROT 6.3 08/18/2014 0330   ALBUMIN 2.3* 08/18/2014 0330   AST 181* 08/18/2014 0330   ALT 296* 08/18/2014 0330   ALKPHOS 439* 08/18/2014 0330   BILITOT 3.3* 08/18/2014 0330   GFRNONAA 22* 08/20/2014 1401   GFRAA 25* 08/20/2014 1401        Assessment and plan: 81 yo male with PVD, DM, Afib, aortic dissection, ILD/PAH in setting of amio toxicity who was admitted 2/16 with N/V,  encephalopathy.   1. Code Status:  DNR   2. GOC: Saw [name] [name] and [name] BraunKaren this morning and reviewed events from overnight with increased dyspnea. [name]'s brother also passively listening in room. Required increased dose of fentanyl but still having dyspnea this morning (actually has complained little of this subjective feeling to me the past few days). Even with increased dose of Fentanyl overnight, still havening more pain/dyspnea today.  Talked to him about his conversation with nursing overnight about wanting to shift to comfort.  [name] BraunKaren pleaded with [name] [name] that we needed to see what was going on with bowels and try to fix that.  I again stated my concern about how tenuous his resp status is and that I would not expect this to drastically improve by fixing a condition with his bowels.  [name] BraunKaren talked about how it could be his gallbladder or something else in his stomach causing his problems. I reinforced that we have been aggressively trying to treat what we can, but things look again worse today and even if it was something like his gallbladder, I would worry very much about his ability to tolerate surgery or whether a surgeon would even be willing to do it given his clinical status.  [name] [name] voiced to [name] that he had "enough" and  wanted to stop oxygen and allowed to pass. I instructed [name] [name] that this could mean he only has a few hours to live. He again stated to [name] [name] that he wanted this to stop. [name] [name] states she wanted to as he wishes but requested she be allowed to have lunch first and call his children.  I asked Damacio if he wanted me to call anyone or try to get anyone here before proceeding with de-escelation of care and he declined.  [name] [name] stated she would call his children and we could discuss these issues further in afternoon. [name] [name] seemed agreeable with that. Mance agreed we could talk further about care including how we would be able to keep him comfortable as we de-escelate things if he so desired.    When I returned in afternoon.  [name] [name] had removed his NRB already and refusing O2.  [name] at bedside and had called his children so [name] [name] could talk to them.  [name] [name] had severe work of breathing and O2 sats in the 60's when I arrived.  He was able to talk briefly with me and stated again that he was "tired" and wanted this "all to stop".  He was agreeable to comfort meds.  [name] [name] states that she was doing okay and declined any spiritual care support.  I will go ahead and order comfort meds as below.   3. Symptom Management:  Chronic Pain/Dyspnea- Start fentanyl infusion at 52mcg/hr.  Bolus dosing via infusion q19min PRN. Ativan PRN. Will bolus with both now.  May need further titration, but suspect with where his O2 sats are, we may only be talking minutes to hours for prognosis.       Total Time: 60 minutes >50% of time spent in counseling and coordination of care regarding above.   Orvis Brill D.O. Palliative Medicine Team at [facility]  Pager: [phone] Team Phone: [phone]

## 2014-08-20 NOTE — Progress Notes (Addendum)
[facility] PROGRESS NOTE  [name] [name] YQI:[id number] DOB: December 13, 1928 DOA: 08/19/2014 PCP: Tawanna Solo, MD  Assessment/Plan: Principal Problem:   Sepsis - Source most likely secondary to urinary source given urinalysis - Urine culture growing 1 more than 100,000 colony-forming units of multiple bacterial morphotypes. Will narrow antibiotic regimen to Zosyn. I'm concerned that vancomycin may also be affecting his kidney function as such will hold for today and reassess next a.m. Should patient become febrile or worsening in condition and will plan on broadening antibiotic coverage further. - Patient has had hard formed stools as such index of suspicion for c diff infection negative.  Hypocalcemia - 7.96 after correction - Will replace calcium orally and reassess  UTI - Foley catheter changed while in house - Please see above - Urine cultures results discussed above  Active Problems:   CAD - CABG x 4 in 2006 -No chest pain currently reported    Neurogenic bladder   Pulmonary fibrosis - Reportedly patient developed trouble breathing after being placed on amiodarone. It is presumed the patient had some pulmonary side effects to amiodarone per my discussion with wife. - Consulted pulmonology for medical optimization    H/O aortic dissection - No chest pain/back pain reported    Acute encephalopathy - Most likely secondary to sepsis from infectious etiology - Improving    Chronic atrial fibrillation -Beta blocker at home dose will await to see what patient's heart rate is after this morning's administration. Heart rate still elevated will consider further rate controlling medication. - Per my discussion with wife patient was on amiodarone but developed problems with respiration and was subsequently diagnosed with pulmonary fibrosis thought to be secondary to amiodarone.     Diabetes mellitus type 2, uncontrolled - advance diet to diabetic diet - Change to SSI  (sensitive scale)    Acute and chronic respiratory failure (acute-on-chronic) - Saturating well with nonrebreather - Pulmonology on board and managing.   Constipation - improving patient currently on laxative - will add MOM enema given that patient still has stool burden    LFT elevation - Could be related to sepsis although alk phosphatase elevated as well raising suspicion for gall stones. General surgery on board. I suspect that source of infection most likely from UTI as such we'll not make recommendations for percutaneous drainage of gall bladder - LFT's trending down currently  Code Status: DNR/DNI Family Communication: discussed with wife at bedside. Disposition Plan: Monitor in stepdown/ICU area   Consultants:  General surgery  Pulmonologist  Procedures:  None  Antibiotics:  Vancomycin and Zosyn  Metronidazole>>08/18/14  HPI/Subjective: Still having stool output. Wife and I discussed rational of using Senna to stimulate peristalsis.   Objective: Filed Vitals:   08/20/14 0800  BP: 148/63  Pulse: 115  Temp: 98.3 F (36.8 C)  Resp: 28    Intake/Output Summary (Last 24 hours) at 08/20/14 1039 Last data filed at 08/20/14 0900  Gross per 24 hour  Intake   1880 ml  Output    920 ml  Net    960 ml   Filed Weights   08/08/2014 2056 08/17/14 0015  Weight: 71.668 kg (158 lb) 69.5 kg (153 lb 3.5 oz)    Exam:   General:  Patient in no acute distress, alert and awake  Cardiovascular: S1 and S2 present and within normal limits, no rubs  Respiratory: No wheezes, equal chest rise  Abdomen: Soft, generalized tenderness with deep palpation, no guarding, no rebound tenderness  Musculoskeletal: No cyanosis  or clubbing   Data Reviewed: Basic Metabolic Panel:  Recent Labs Lab 08/07/2014 1917 08/17/14 0420 08/18/14 0330 08/20/14 0353 08/20/14 0939  NA 141 147* 149* 145  --   K 4.3 4.7 3.9 4.1  --   CL 107 115* 113* 115*  --   CO2 $Re'24 19 22 'fVd$ 18*  --    GLUCOSE 139* 48* 114* 354* 481*  BUN 80* 71* 75* 94*  --   CREATININE 2.69* 2.40* 2.24* 2.54*  --   CALCIUM 8.2* 8.0* 7.5* 6.6*  --    Liver Function Tests:  Recent Labs Lab 08/01/2014 1917 08/17/14 0420 08/18/14 0330  AST 465* 389* 181*  ALT 338* 344* 296*  ALKPHOS 452* 426* 439*  BILITOT 3.7* 4.0* 3.3*  PROT 7.0 6.4 6.3  ALBUMIN 2.7* 2.4* 2.3*   No results for input(s): LIPASE, AMYLASE in the last 168 hours. No results for input(s): AMMONIA in the last 168 hours. CBC:  Recent Labs Lab 08/01/2014 1917 08/17/14 0420 08/18/14 0330 08/20/14 0353  WBC 9.3 12.3* 11.6* 12.9*  NEUTROABS 8.2* 11.2*  --   --   HGB 10.4* 10.6* 10.5* 9.3*  HCT 33.5* 33.7* 34.3* 30.0*  MCV 92.5 91.8 91.7 91.5  PLT 250 222 249 218   Cardiac Enzymes: No results for input(s): CKTOTAL, CKMB, CKMBINDEX, TROPONINI in the last 168 hours. BNP (last 3 results)  Recent Labs  06/29/14 0410 06/30/14 0415  BNP 283.2* 117.3*    ProBNP (last 3 results)  Recent Labs  05/18/14 1410 06/20/14 1202  PROBNP 4183.0* 14250.0*    CBG:  Recent Labs Lab 08/19/14 0426 08/19/14 0749 08/19/14 1154 08/19/14 1542 08/19/14 2157  GLUCAP 175* 119* 253* 317* 285*    Recent Results (from the past 240 hour(s))  Blood Culture (routine x 2)     Status: None (Preliminary result)   Collection Time: 08/25/2014  7:17 PM  Result Value Ref Range Status   Specimen Description BLOOD LEFT ARM  Final   Special Requests BOTTLES DRAWN AEROBIC AND ANAEROBIC 5CC  Final   Culture   Final    GRAM POSITIVE COCCI IN CHAINS Note: Gram Stain Report Called to,Read Back By and Verified With: Charma Igo 08/19/14 AT 0230 RIDK Performed at [facility]    Report Status PENDING  Incomplete  Blood Culture (routine x 2)     Status: None (Preliminary result)   Collection Time: 08/03/2014  7:45 PM  Result Value Ref Range Status   Specimen Description BLOOD RIGHT ARM  Final   Special Requests BOTTLES DRAWN AEROBIC AND  ANAEROBIC 5CC EACH  Final   Culture   Final           BLOOD CULTURE RECEIVED NO GROWTH TO DATE CULTURE WILL BE HELD FOR 5 DAYS BEFORE ISSUING A FINAL NEGATIVE REPORT Performed at [facility]    Report Status PENDING  Incomplete  Urine culture     Status: None   Collection Time: 08/25/2014  8:03 PM  Result Value Ref Range Status   Specimen Description URINE, CATHETERIZED  Final   Special Requests NONE  Final   Colony Count   Final    >=100,000 COLONIES/ML Performed at [facility]    Culture   Final    Multiple bacterial morphotypes present, none predominant. Suggest appropriate recollection if clinically indicated. Performed at [facility]    Report Status 08/18/2014 FINAL  Final  MRSA PCR Screening     Status: Abnormal   Collection Time:  08/17/14 12:46 AM  Result Value Ref Range Status   MRSA by PCR POSITIVE (A) NEGATIVE Final    Comment:        The GeneXpert MRSA Assay (FDA approved for NASAL specimens only), is one component of a comprehensive MRSA colonization surveillance program. It is not intended to diagnose MRSA infection nor to guide or monitor treatment for MRSA infections. RESULT CALLED TO, READ BACK BY AND VERIFIED WITH: S. YOUNG RN AT [phone] ON 02.17.16 BY SHUEA   Stool culture     Status: None (Preliminary result)   Collection Time: 08/18/14 10:07 AM  Result Value Ref Range Status   Specimen Description STOOL  Final   Special Requests NONE  Final   Culture   Final    Culture reincubated for better growth Performed at [facility]    Report Status PENDING  Incomplete     Studies: Dg Abd Portable 2v  08/20/2014   CLINICAL DATA:  Obstipation and abdominal distention.  EXAM: PORTABLE ABDOMEN - 2 VIEW  COMPARISON:  08/17/2014  FINDINGS: Similar appearance of gaseous distention of the stomach with small amount of fluid in the stomach on the decubitus view. Amount of fecal material in the colon appears significantly less  compared to the prior study. There is no evidence of small bowel obstruction. No free air is identified on the decubitus film.  IMPRESSION: Stable distention of the stomach. Less apparent fecal material in the colon compared to the prior study.   Electronically Signed   By: Aletta Edouard M.D.   On: 08/20/2014 10:29    Scheduled Meds: . calcium carbonate  1 tablet Oral BID WC  . Chlorhexidine Gluconate Cloth  6 each Topical Q0600  . feeding supplement (GLUCERNA SHAKE)  237 mL Oral TID BM  . heparin  5,000 Units Subcutaneous 3 times per day  . insulin aspart  0-15 Units Subcutaneous TID WC  . insulin aspart  0-5 Units Subcutaneous QHS  . methylPREDNISolone (SOLU-MEDROL) injection  80 mg Intravenous Q12H  . metoprolol tartrate  50 mg Oral BID  . multivitamin with minerals  1 tablet Oral Daily  . mupirocin ointment  1 application Nasal BID  . piperacillin-tazobactam (ZOSYN)  IV  3.375 g Intravenous Q8H  . senna  1 tablet Oral Daily  . sodium chloride  3 mL Intravenous Q12H   Continuous Infusions: . sodium chloride Stopped (08/17/14 0900)     Time spent: > 35 minutes    Velvet Bathe  [facility] Pager [phone] If 7PM-7AM, please contact night-coverage at [email or web address], password [facility] 08/20/2014, 10:39 AM  LOS: 4 days     Was paged about elevated blood sugars. Patient covered with 17 units NovoLog. One hour later blood sugars in the 400s range. Anion gap calculated at 12 this morning. We'll try to avoid insulin drip and place patient on Levemir 5 units subcutaneous once and 10 units of NovoLog. We'll reassess CBGs 1-2 hours after administration of insulin regimen and reassess BMP in 2 hours. Pending results we'll decide whether or not to place patient on insulin drip or not.

## 2014-08-20 NOTE — Progress Notes (Signed)
Pt removing NRB mask and refusing any oxygen at this time. Trying to pull off telemetry wires and IV tubing. Palliative MD at bedside now. New orders received.

## 2014-08-21 DIAGNOSIS — J9620 Acute and chronic respiratory failure, unspecified whether with hypoxia or hypercapnia: Secondary | ICD-10-CM

## 2014-08-22 LAB — CULTURE, BLOOD (ROUTINE X 2)

## 2014-08-22 LAB — STOOL CULTURE

## 2014-08-23 LAB — CULTURE, BLOOD (ROUTINE X 2): CULTURE: NO GROWTH

## 2014-08-30 NOTE — Progress Notes (Signed)
Pt expired at 0240 and was pronounced by 2 RNs after verification of no lung or heart sounds.  Pt had no visible chest rise and no sounds were heard after listening to chest for one minute.  Family was at bedside and emotional support provided.  [facility] NP notified and death certificate signed prior to pt being transferred to morgue.  Pt valuables were returned to wife prior to pt passing.   170cc of Fentanyl drip wasted in sink after patient expired.  Concentration of drip was 6910mcg/ml.    Kevan Nyheryl Denny, RN was second RN to pronounce and witnessed waste of fentanyl drip.

## 2014-08-30 DEATH — deceased

## 2014-09-04 NOTE — Discharge Summary (Signed)
Death Summary  [name] [name] EXB:[id number] DOB: 08/04/1928 DOA: 03-16-2015  PCP: [name],[name], MD   Admit date: 03-16-2015 Date of Death: 09/04/2014  Final Diagnoses:  Principal Problem:   Sepsis Acute on chronic respiratory failure  Active Problems:   CAD - CABG x 4 in 2006   Neurogenic bladder   Pulmonary fibrosis   H/O aortic dissection   Acute encephalopathy   Chronic atrial fibrillation   Diabetes mellitus type 2, uncontrolled   Acute and chronic respiratory failure (acute-on-chronic)   LFT elevation   Chronic pain syndrome   Palliative care encounter   Ileus   Dyspnea and respiratory abnormality  History of present illness:  Pt is an 81 y/o with multiple medical comorbidities listed above that presented to the hospital on 2/16 with complaints of nausea, emesis, and altered mental status.  Hospital Course:  Pt presented with Sepsis secondary to urinary source but also had difficulty breathing which did not improve despite aggressive medical therapy and pulmonary consultation.  Despite aggressive therapy with IV antibiotics and supplemental oxygen patient's condition at best was minimally improved. As such he requested comfort care of which palliative assisted.    Per nursing notes: Pt expired at 0240 and was pronounced by 2 RNs after verification of no lung or heart sounds. Pt had no visible chest rise and no sounds were heard after listening to chest for one minute. Family was at bedside and emotional support provided. [facility] NP notified and death certificate signed prior to pt being transferred to morgue.   Time 08/07/2014 at 240AM  Signed:  Penny PiaVEGA, Maricus Tanzi  [facility] 09/04/2014, 11:59 AM

## 2014-09-22 ENCOUNTER — Inpatient Hospital Stay: Payer: Medicare Other | Admitting: Pulmonary Disease

## 2016-01-10 IMAGING — CR DG CHEST 1V PORT
1 series · 2 of 2 positions shown · non-contrast
Comparison: 06/20/2014

CLINICAL DATA: Acute respiratory failure, hypoxia

EXAM:
PORTABLE CHEST - 1 VIEW

[Series 1: AP · U · 2 of 2 slices shown]
[im 1/2]
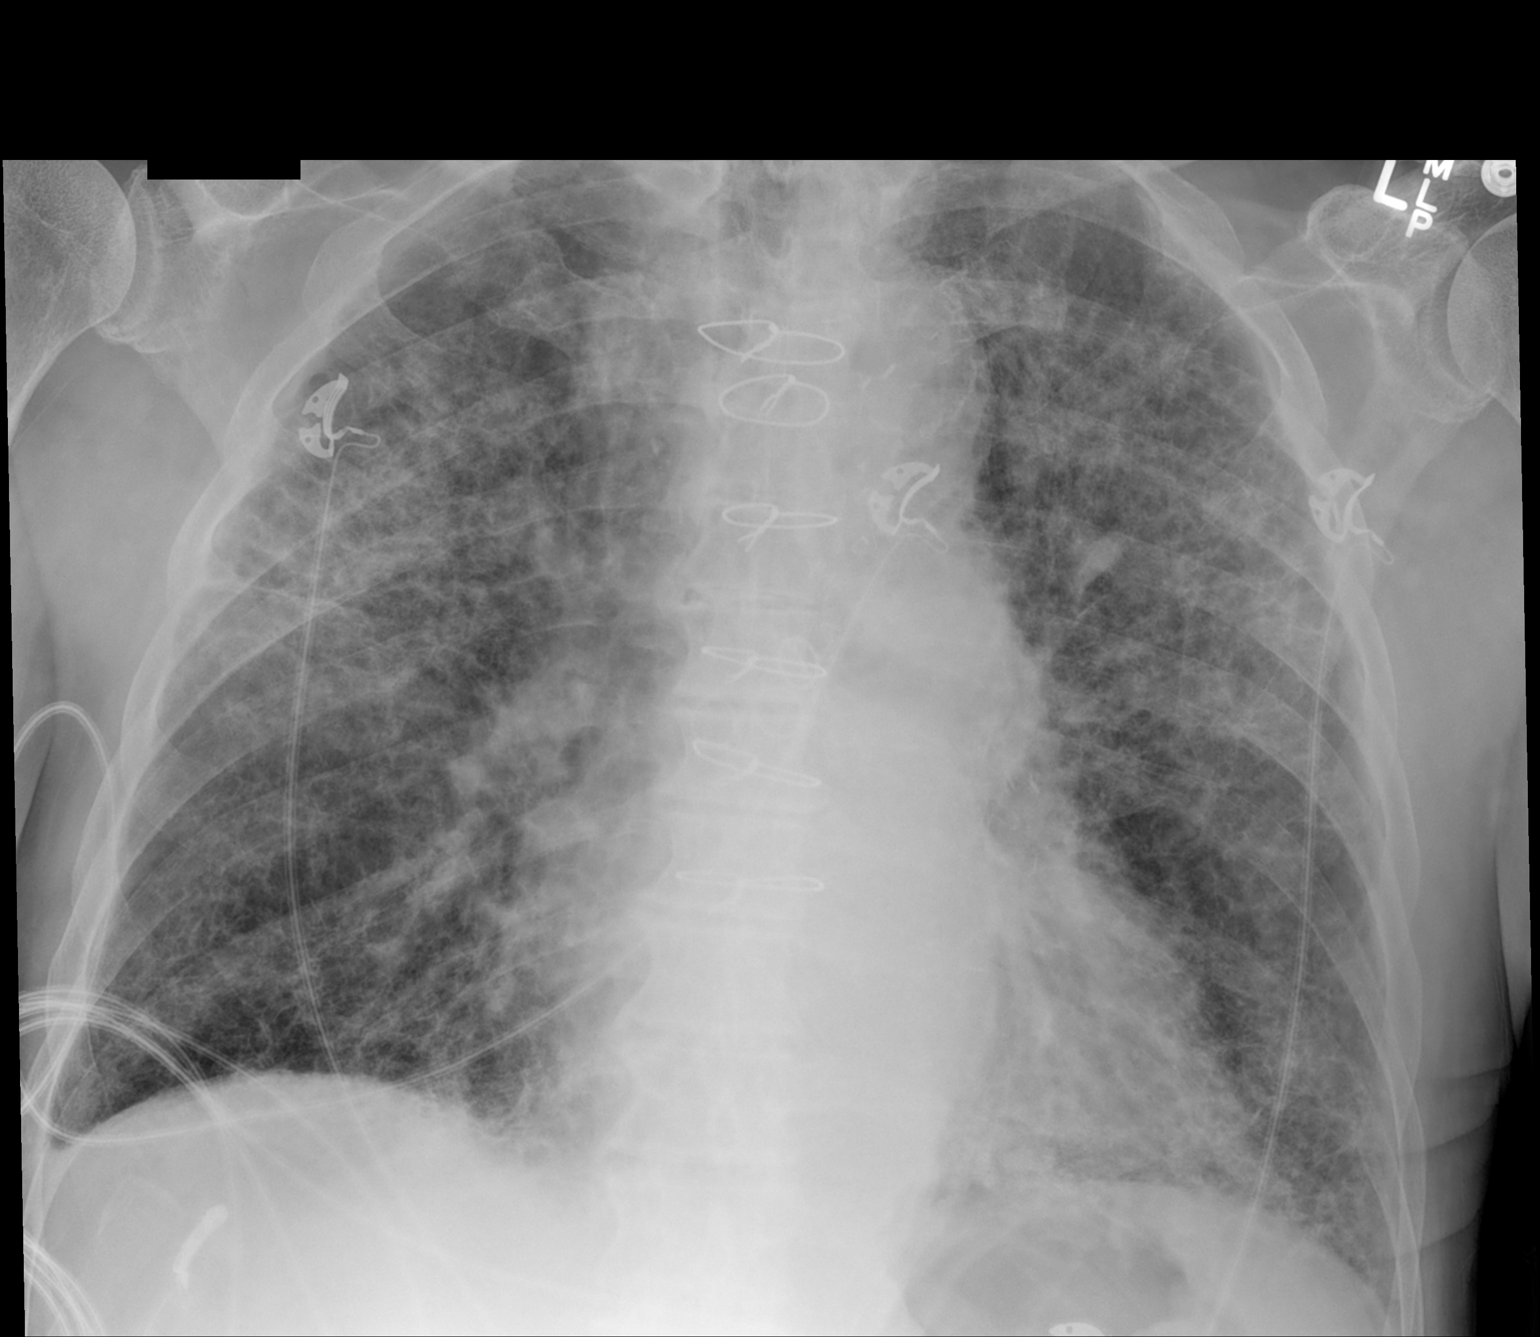
[im 2/2]
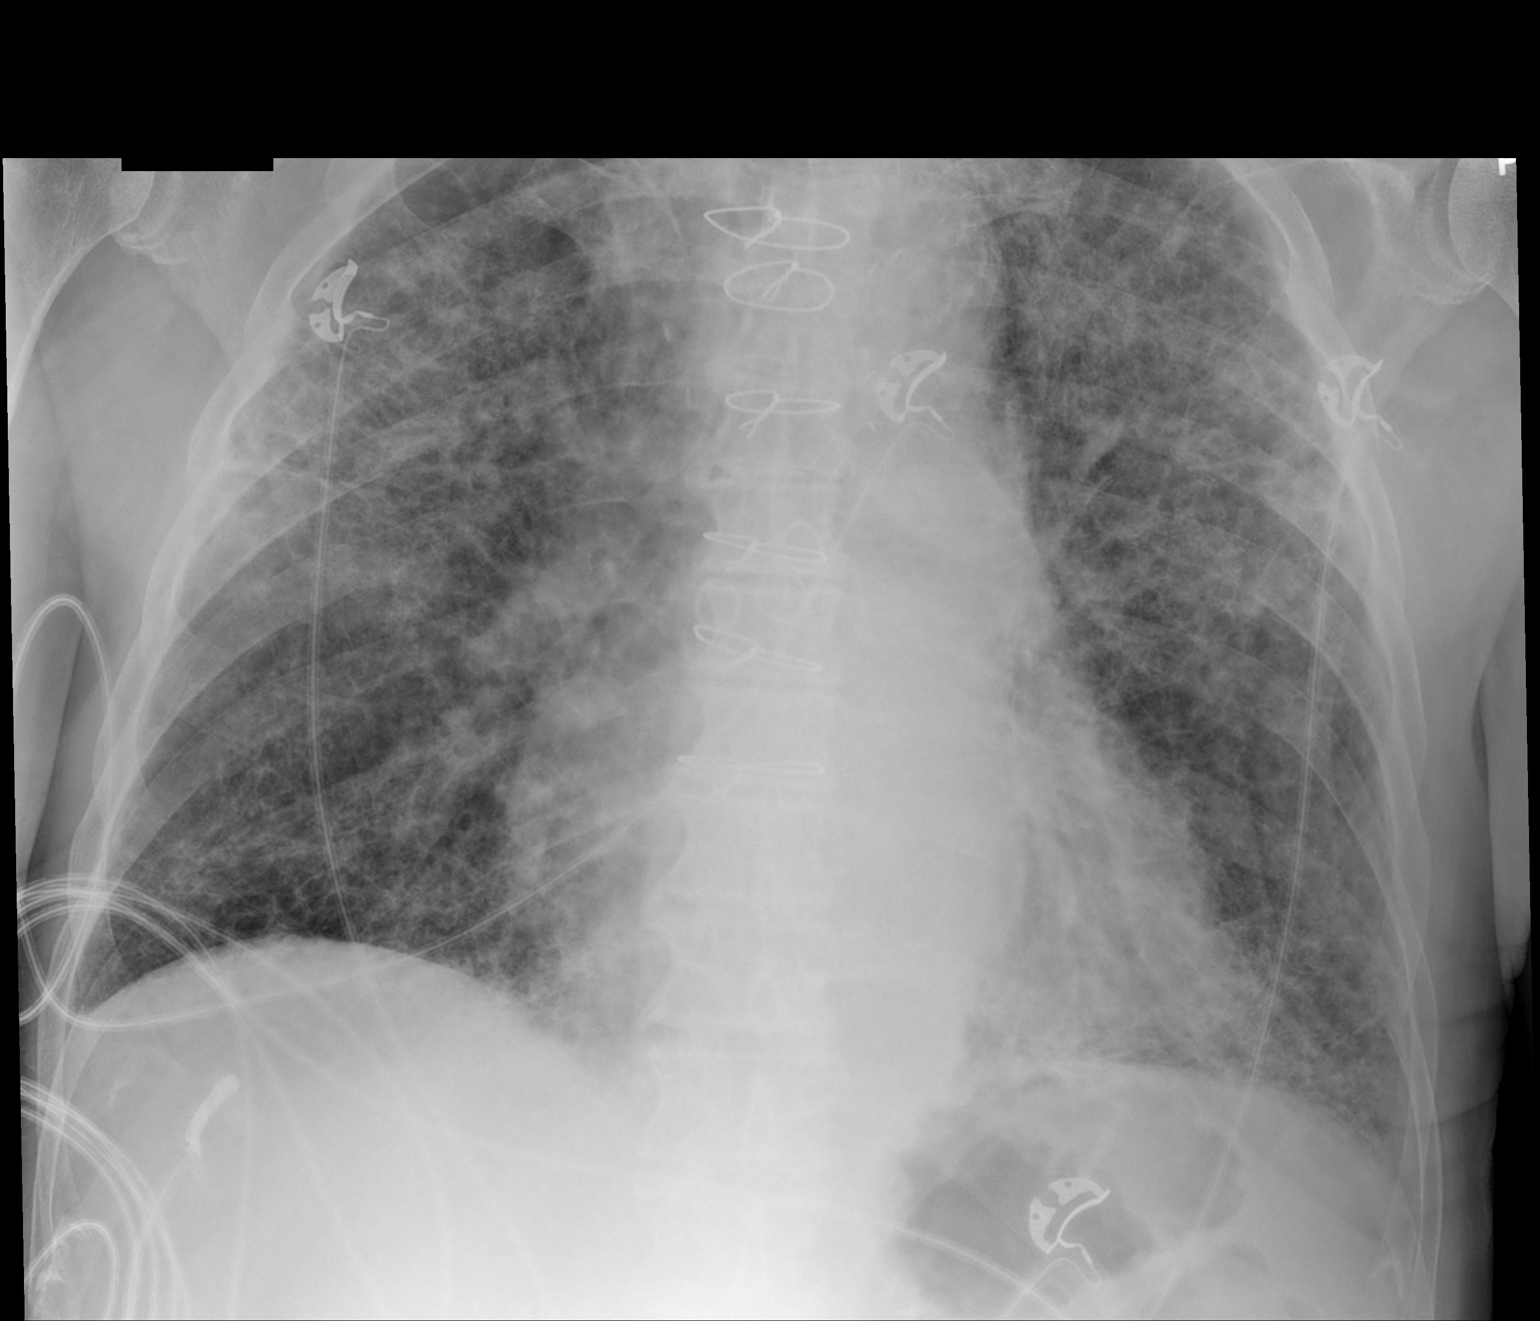

[2 of 2 positions shown; findings below may reference images not displayed]

FINDINGS: Cardiomediastinal silhouette is stable. Again noted reticular
diffuse interstitial prominence bilaterally probable due to chronic
interstitial lung disease and fibrotic changes. No definite
superimposed infiltrate or pulmonary edema. Poorly visualized nodule
in right upper lobe again noted measures about 2 cm. This was better
visualized on recent CT scan.
IMPRESSION: Again noted diffuse reticular interstitial prominence probable due
to chronic interstitial lung disease and fibrotic changes. No
definite superimposed infiltrate. Again noted nodule in right upper
lobe medially. This was better visualized on recent CT scan.

## 2016-01-12 IMAGING — DX DG CHEST 1V PORT
2 series · 2 of 2 positions shown · non-contrast
Comparison: Chest radiograph performed 06/23/2014, and CT of the
chest performed 06/20/2014

CLINICAL DATA: Acute onset of hypoxia and worsening shortness of
breath. Initial encounter.

EXAM:
PORTABLE CHEST - 1 VIEW

[chest ap (1 of 2)]
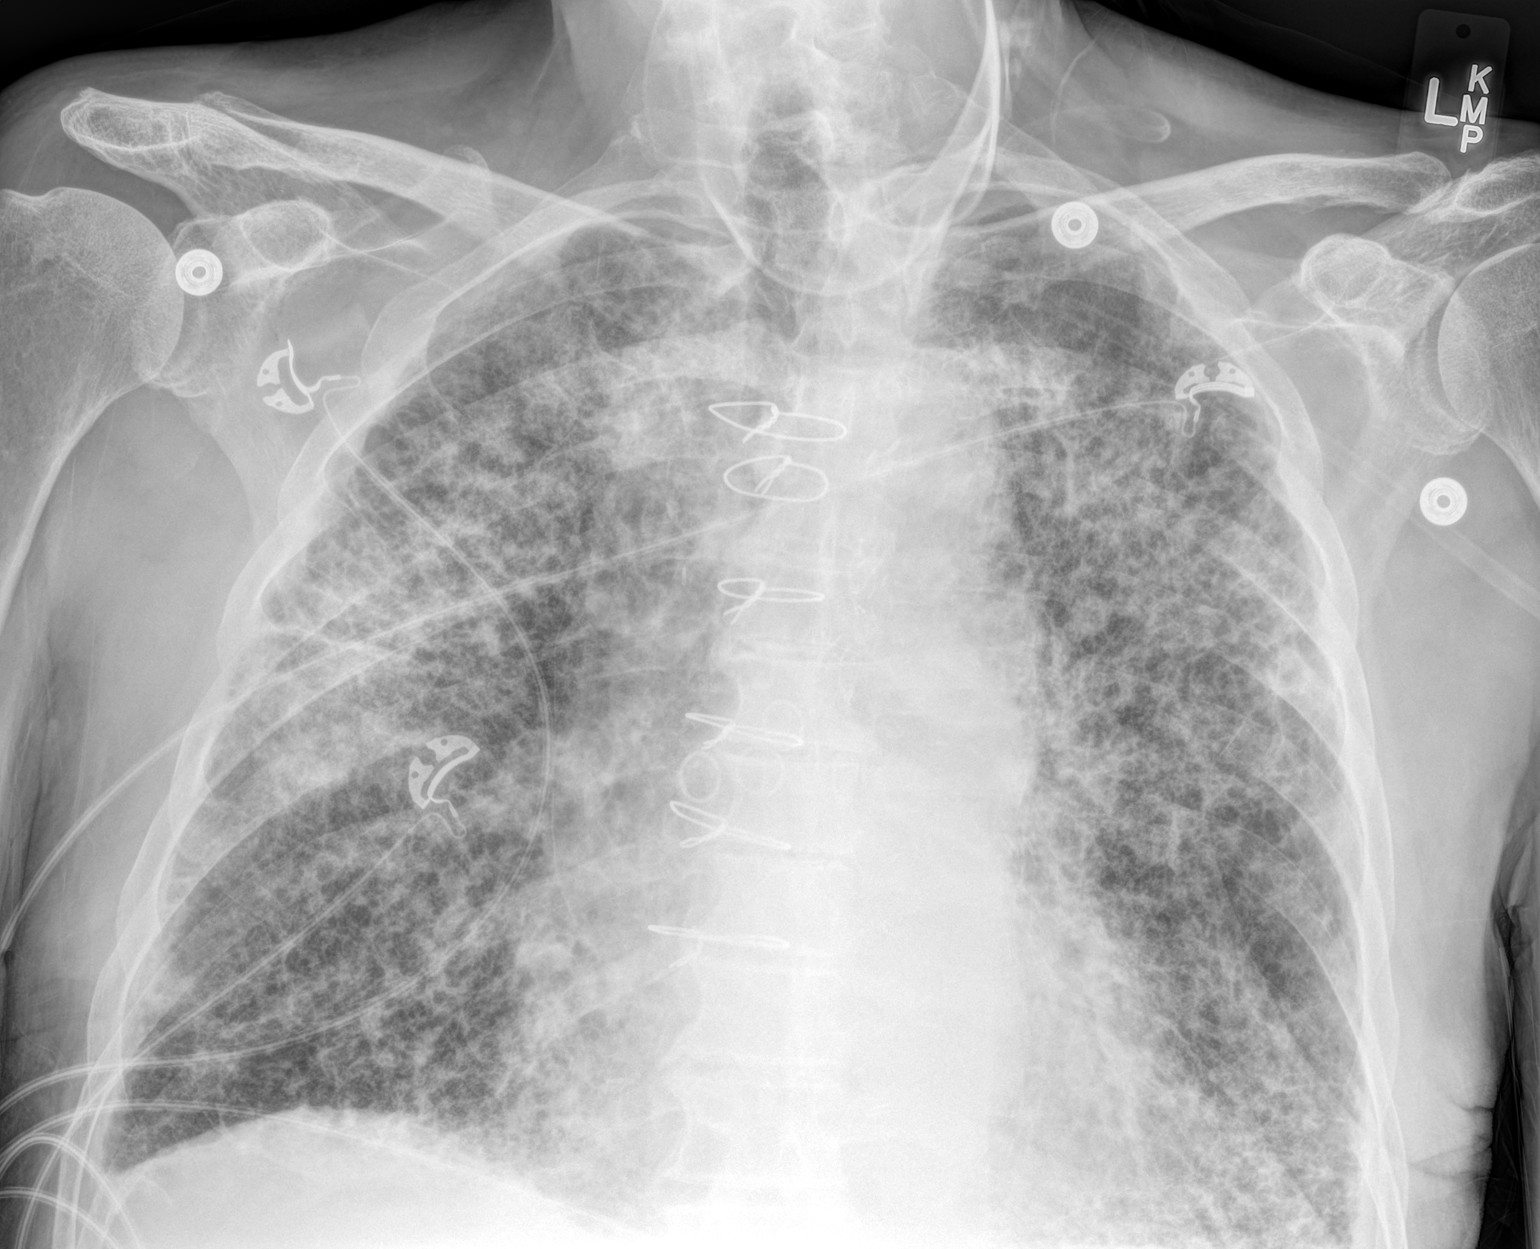

[chest ap (2 of 2)]
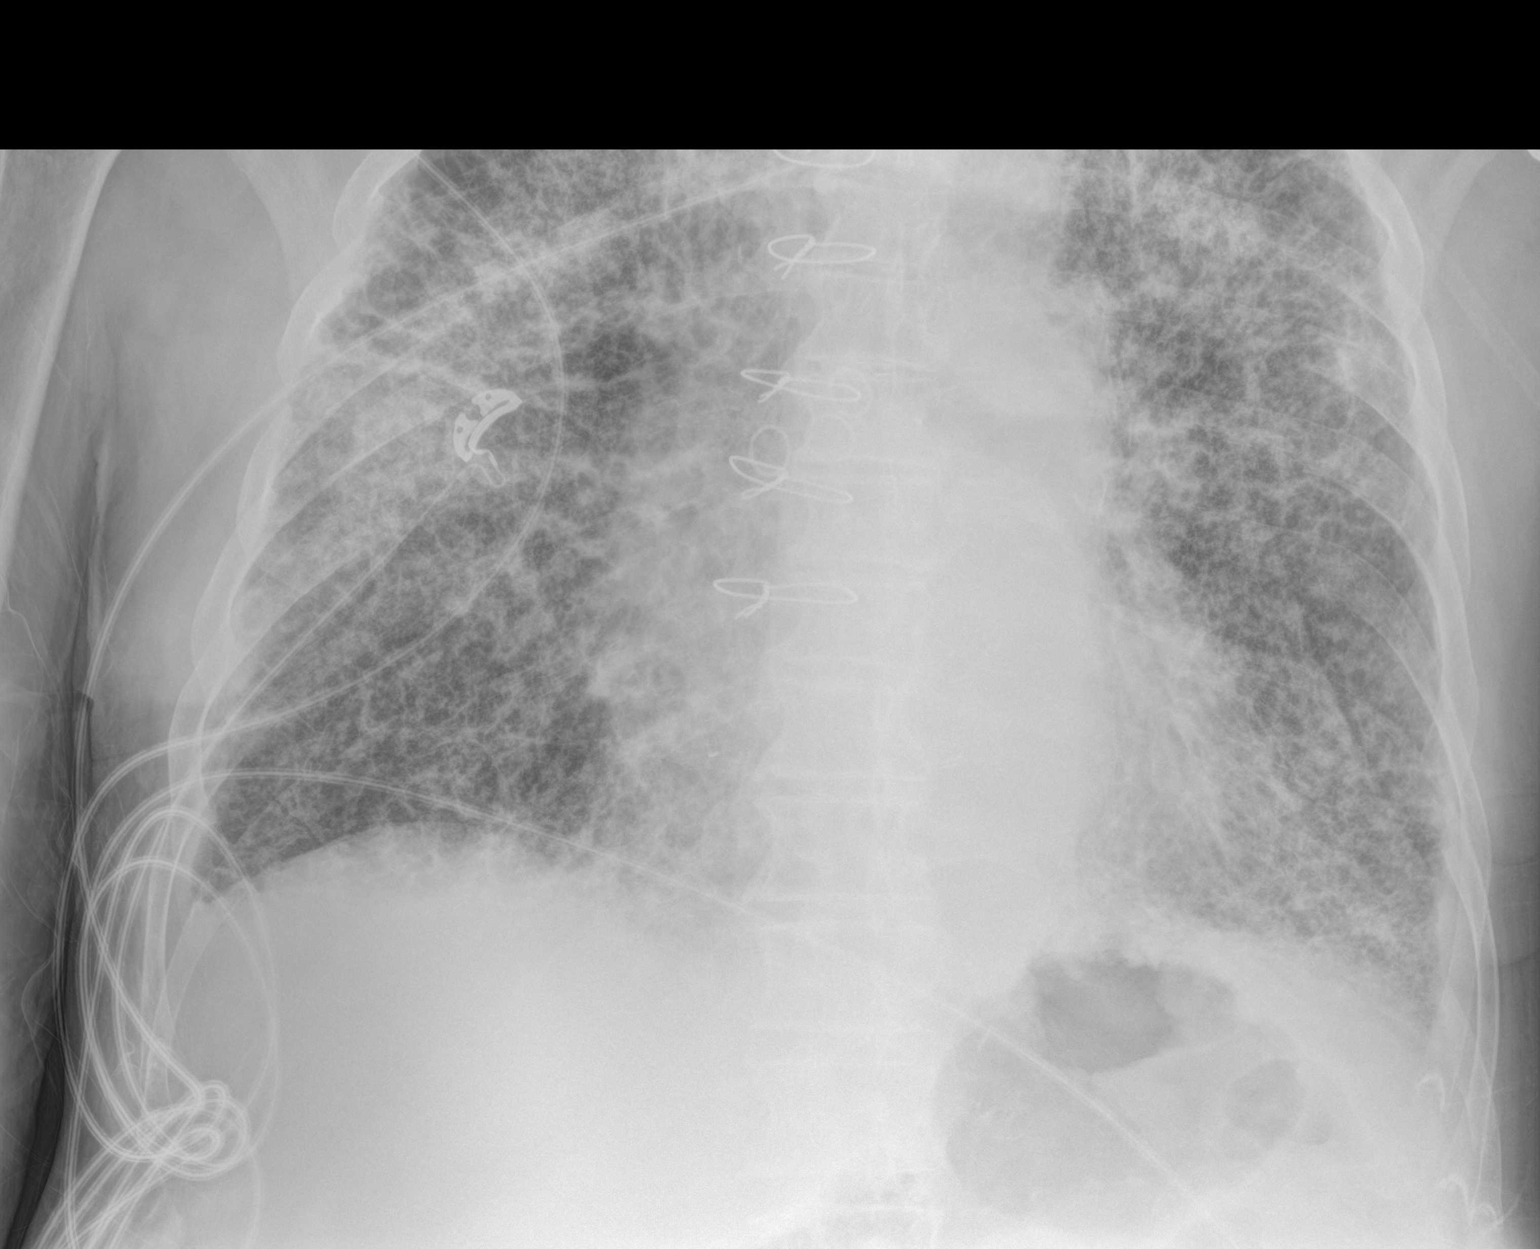

[2 of 2 positions shown; findings below may reference images not displayed]

FINDINGS: There is mildly worsened diffuse interstitial prominence, raising
suspicion for mild pulmonary edema or atypical pneumonia,
superimposed on the patient's chronic emphysema, fibrosis and
interstitial lung disease. The known right upper lobe pulmonary
nodule is better characterized on recent CT. No definite pleural
effusion or pneumothorax is seen.

The cardiomediastinal silhouette remains normal in size. The patient
is status post median sternotomy, with evidence of prior CABG. No
acute osseous abnormalities are seen.
IMPRESSION: 1. Mildly worsened diffuse interstitial prominence, raising
suspicion for mild pulmonary edema or atypical pneumonia,
superimposed on the patient's chronic emphysema, fibrosis and
interstitial lung disease.
2. Known right upper lobe pulmonary nodule is better characterized
on recent CT.

## 2016-01-16 IMAGING — CR DG CHEST 1V PORT
1 series · 1 of 1 positions shown · non-contrast
Comparison: 06/27/2014.

CLINICAL DATA: Pulmonary infiltrates.  Respiratory failure.

EXAM:
PORTABLE CHEST - 1 VIEW

[AP]
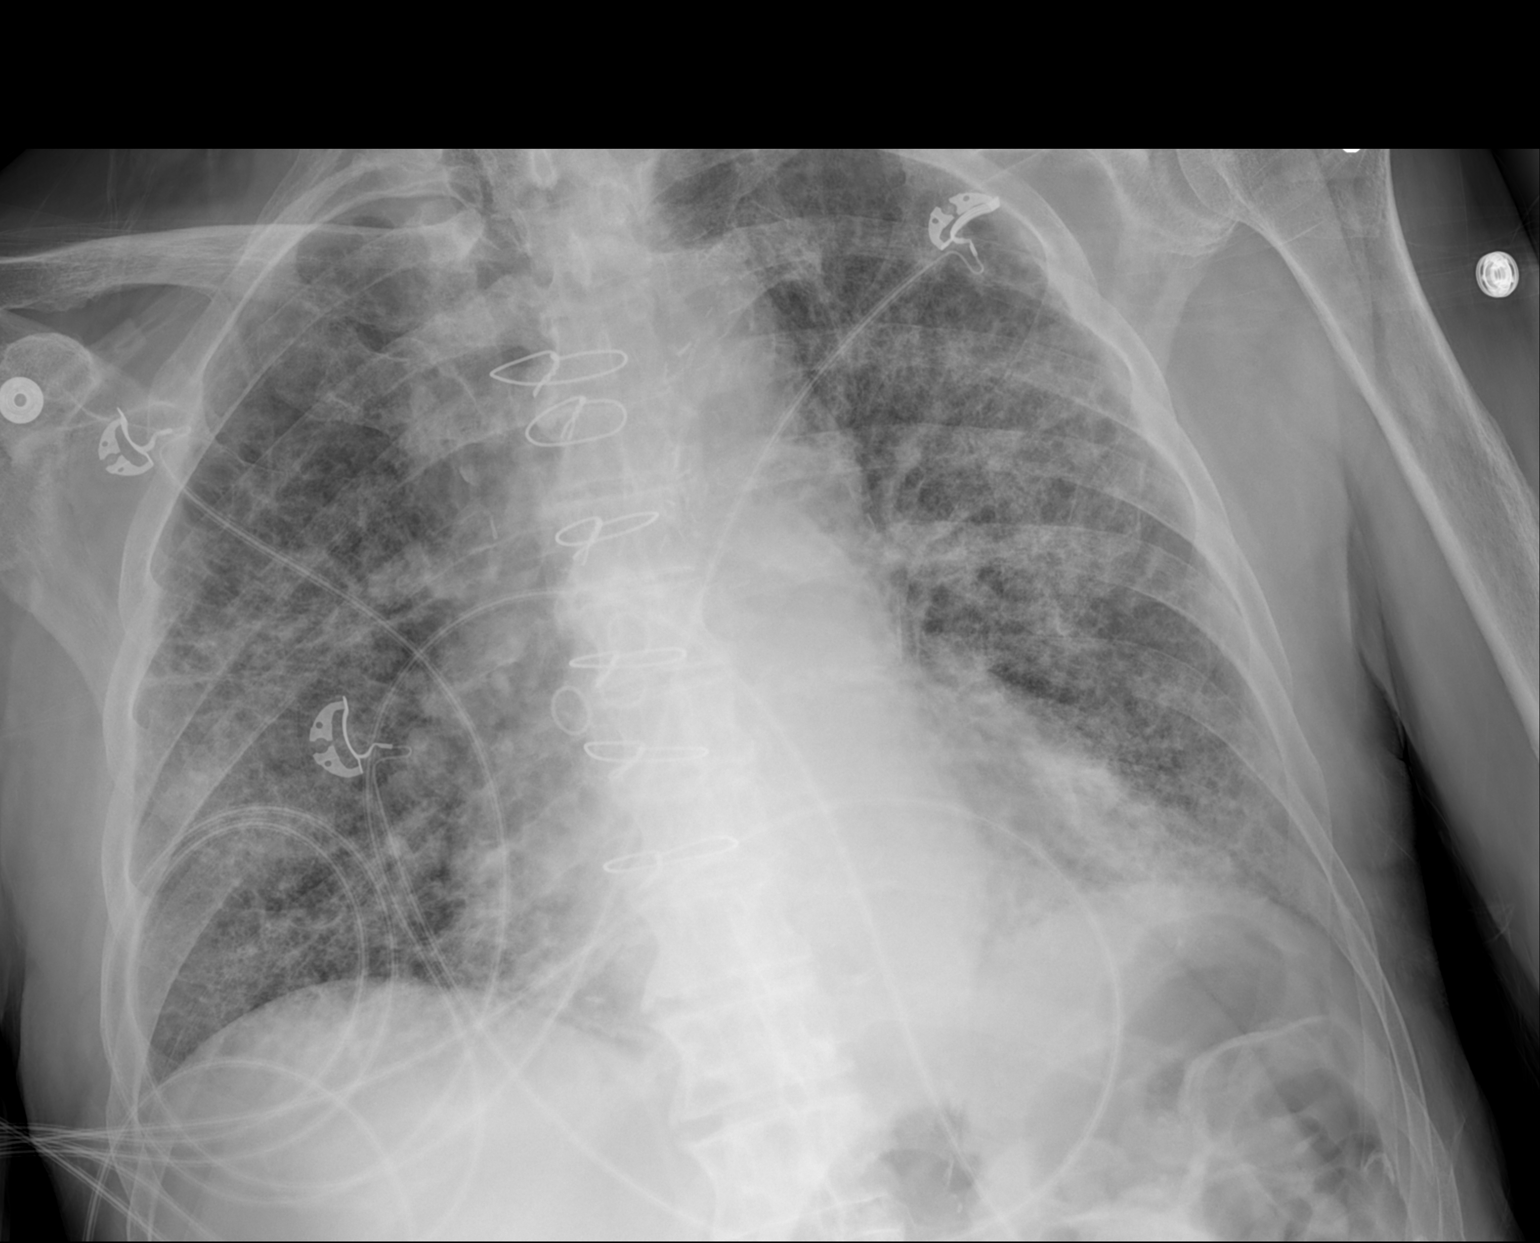

[1 of 1 positions shown; findings below may reference images not displayed]

FINDINGS: Median sternotomy and CABG. The cardiopericardial silhouette remains
enlarged. Mediastinal contours appear similar allowing for rotation
and projectional differences on today's examination.

The lungs show pulmonary fibrosis with no convincing evidence of
superimposed pneumonia. Based on the pulmonary fibrosis,
interstitial pulmonary edema cannot be excluded.

Monitoring leads project over the chest.
IMPRESSION: 1. Unchanged pulmonary fibrosis.
2. No definite superimposed acute cardiopulmonary disease.
3. Unchanged cardiomegaly.  CABG.

## 2016-01-18 IMAGING — CR DG CHEST 2V
3 series · 3 of 3 positions shown · non-contrast
Comparison: 06/29/2014

CLINICAL DATA: Altered mental status. Weakness. Patient not able to
speak.

EXAM:
CHEST  2 VIEW

[w chest lat]
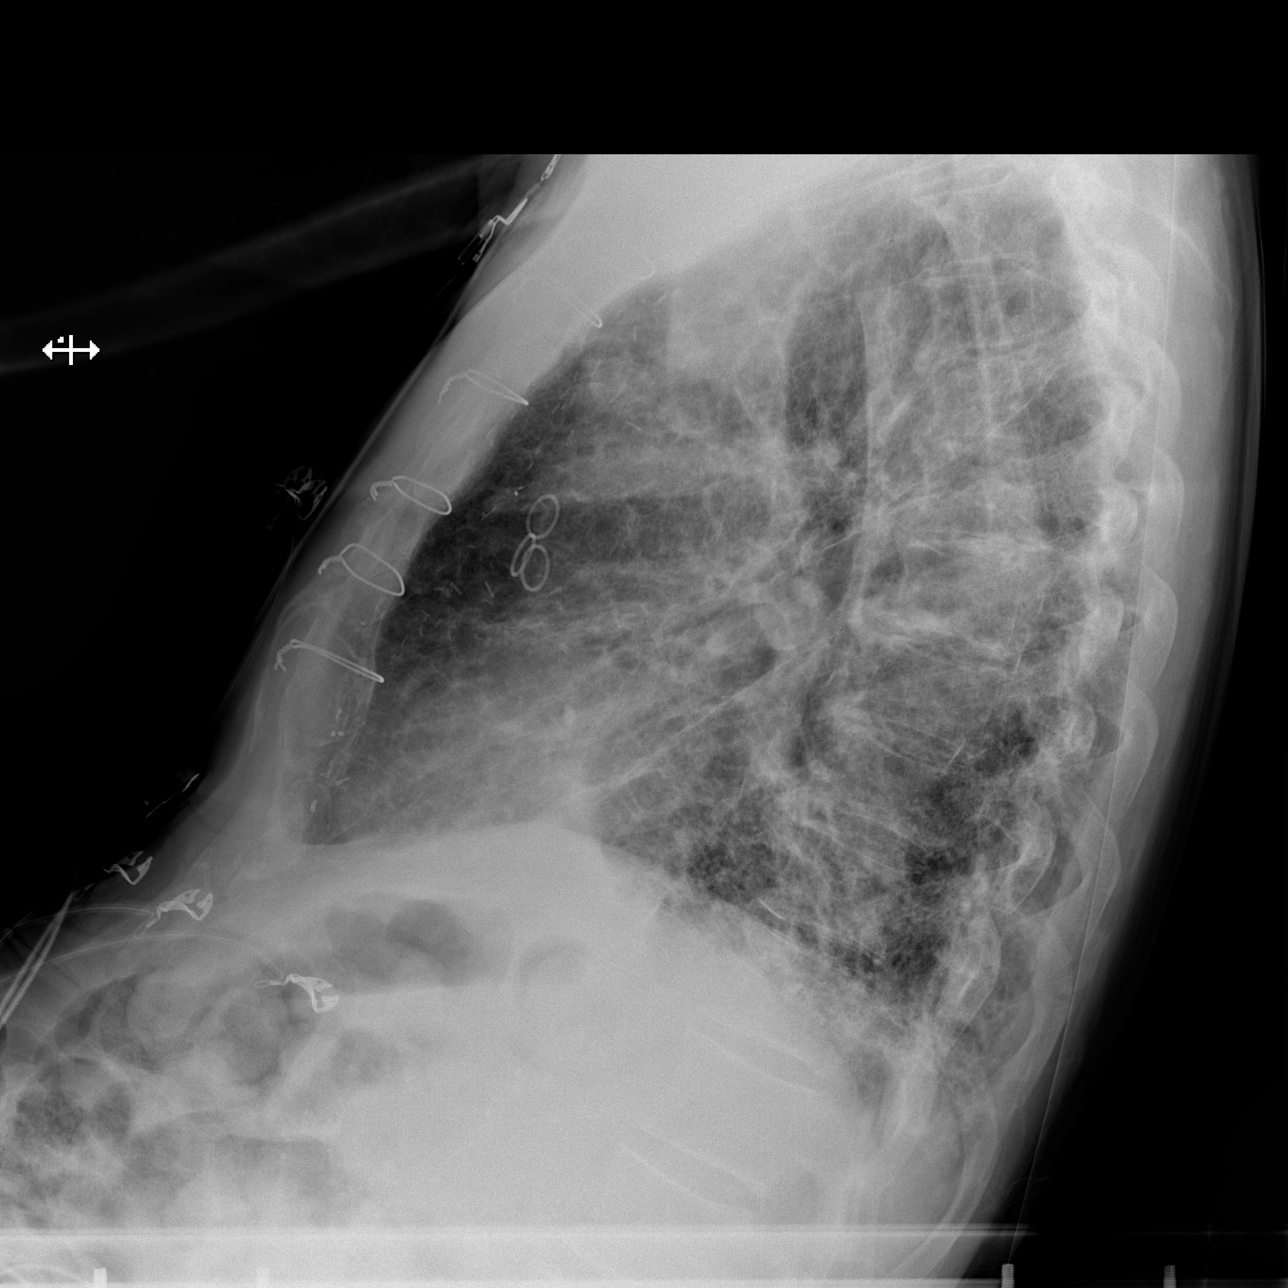

[x chest ap (1 of 2)]
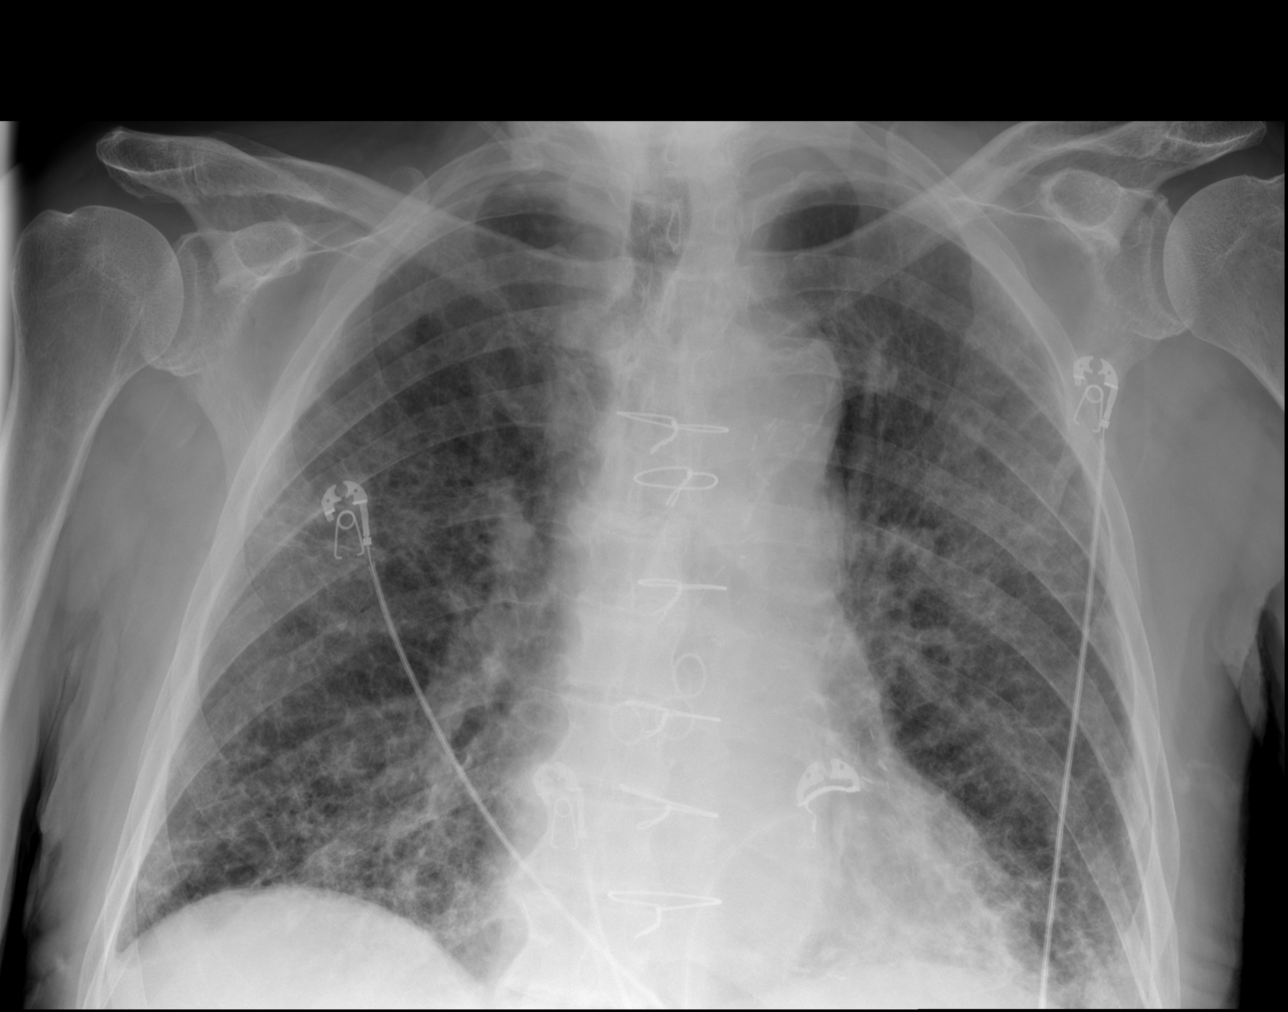

[x chest ap (2 of 2)]
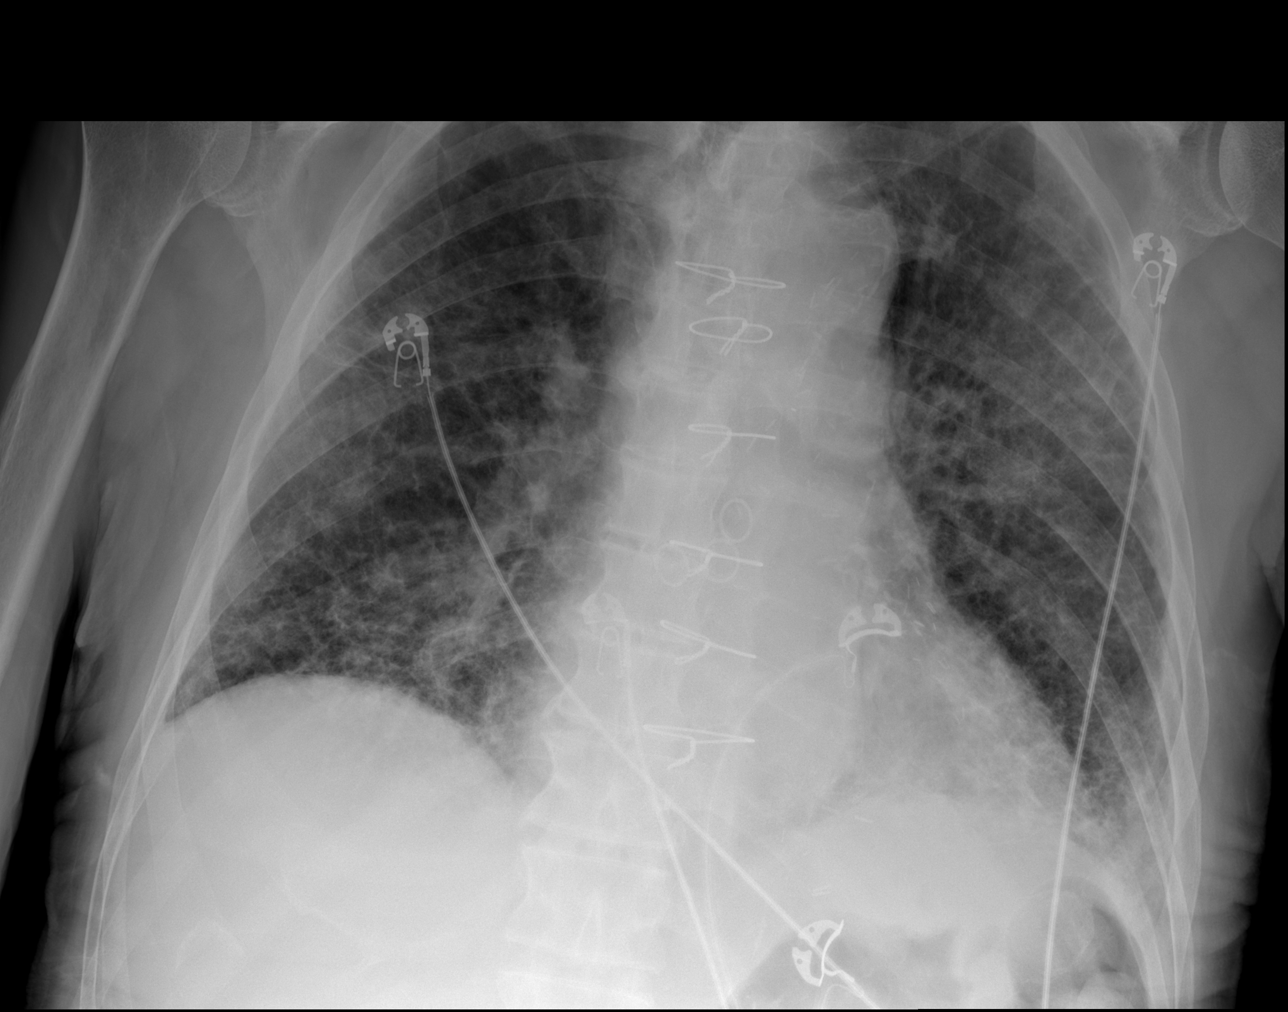

[3 of 3 positions shown; findings below may reference images not displayed]

FINDINGS: Postoperative changes in the mediastinum. Low lung volumes with
diffuse interstitial changes consistent with fibrosis. No
superimposed airspace disease or effusion. No pneumothorax.
Calcified and tortuous aorta. Heart size and pulmonary vascularity
are normal. No change since previous study.
IMPRESSION: Diffuse pulmonary fibrosis with associated low lung volumes. No
superimposed airspace disease.
